# Patient Record
Sex: Male | Born: 1955
Health system: Southern US, Community
[De-identification: ages and names within clinical notes are randomized; demographics above are authoritative.]

## PROBLEM LIST (undated history)

## (undated) DIAGNOSIS — I472 Ventricular tachycardia, unspecified: Secondary | ICD-10-CM

## (undated) DIAGNOSIS — Z95810 Presence of automatic (implantable) cardiac defibrillator: Secondary | ICD-10-CM

## (undated) DIAGNOSIS — I10 Essential (primary) hypertension: Secondary | ICD-10-CM

## (undated) DIAGNOSIS — I509 Heart failure, unspecified: Secondary | ICD-10-CM

## (undated) DIAGNOSIS — I5022 Chronic systolic (congestive) heart failure: Secondary | ICD-10-CM

## (undated) DIAGNOSIS — I700 Atherosclerosis of aorta: Secondary | ICD-10-CM

## (undated) DIAGNOSIS — I4729 Other ventricular tachycardia: Secondary | ICD-10-CM

## (undated) DIAGNOSIS — R918 Other nonspecific abnormal finding of lung field: Secondary | ICD-10-CM

## (undated) DIAGNOSIS — G4730 Sleep apnea, unspecified: Secondary | ICD-10-CM

## (undated) DIAGNOSIS — K7460 Unspecified cirrhosis of liver: Secondary | ICD-10-CM

## (undated) DIAGNOSIS — Z950 Presence of cardiac pacemaker: Secondary | ICD-10-CM

## (undated) DIAGNOSIS — E785 Hyperlipidemia, unspecified: Secondary | ICD-10-CM

## (undated) DIAGNOSIS — J449 Chronic obstructive pulmonary disease, unspecified: Secondary | ICD-10-CM

## (undated) DIAGNOSIS — E119 Type 2 diabetes mellitus without complications: Secondary | ICD-10-CM

## (undated) DIAGNOSIS — I2089 Other forms of angina pectoris: Secondary | ICD-10-CM

## (undated) DIAGNOSIS — IMO0001 Reserved for inherently not codable concepts without codable children: Secondary | ICD-10-CM

## (undated) DIAGNOSIS — I2510 Atherosclerotic heart disease of native coronary artery without angina pectoris: Secondary | ICD-10-CM

## (undated) DIAGNOSIS — K219 Gastro-esophageal reflux disease without esophagitis: Secondary | ICD-10-CM

## (undated) DIAGNOSIS — I208 Other forms of angina pectoris: Secondary | ICD-10-CM

## (undated) DIAGNOSIS — I255 Ischemic cardiomyopathy: Secondary | ICD-10-CM

## (undated) DIAGNOSIS — I219 Acute myocardial infarction, unspecified: Secondary | ICD-10-CM

## (undated) HISTORY — DX: Type 2 diabetes mellitus without complications: E11.9

## (undated) HISTORY — DX: Sleep apnea, unspecified: G47.30

## (undated) HISTORY — PX: CARDIAC SURGERY: SHX584

## (undated) HISTORY — PX: CORONARY ANGIOPLASTY: SHX604

## (undated) HISTORY — DX: Chronic obstructive pulmonary disease, unspecified: J44.9

## (undated) HISTORY — PX: OTHER SURGICAL HISTORY: SHX169

## (undated) HISTORY — PX: CARDIAC PACEMAKER PLACEMENT: SHX583

## (undated) HISTORY — DX: Acute myocardial infarction, unspecified: I21.9

---

## 2008-03-03 ENCOUNTER — Emergency Department: Payer: Self-pay | Admitting: Emergency Medicine

## 2011-07-31 DIAGNOSIS — Z8249 Family history of ischemic heart disease and other diseases of the circulatory system: Secondary | ICD-10-CM | POA: Insufficient documentation

## 2011-10-07 ENCOUNTER — Ambulatory Visit: Payer: Self-pay | Admitting: Cardiovascular Disease

## 2011-10-10 LAB — CBC
HCT: 41 % (ref 40.0–52.0)
HGB: 14.3 g/dL (ref 13.0–18.0)
MCH: 32 pg (ref 26.0–34.0)
MCHC: 34.8 g/dL (ref 32.0–36.0)
MCV: 92 fL (ref 80–100)
RBC: 4.45 10*6/uL (ref 4.40–5.90)
RDW: 14.7 % — ABNORMAL HIGH (ref 11.5–14.5)
WBC: 8.2 10*3/uL (ref 3.8–10.6)

## 2011-10-10 LAB — COMPREHENSIVE METABOLIC PANEL
Albumin: 3.6 g/dL (ref 3.4–5.0)
Alkaline Phosphatase: 110 U/L (ref 50–136)
Anion Gap: 7 (ref 7–16)
BUN: 17 mg/dL (ref 7–18)
Creatinine: 1.28 mg/dL (ref 0.60–1.30)
EGFR (African American): >60
EGFR (Non-African Amer.): >60
Glucose: 132 mg/dL — ABNORMAL HIGH (ref 65–99)
Osmolality: 275 (ref 275–301)
SGOT(AST): 25 U/L (ref 15–37)
SGPT (ALT): 25 U/L
Total Protein: 7.8 g/dL (ref 6.4–8.2)

## 2011-10-10 LAB — PROTIME-INR: INR: 0.9

## 2011-10-10 LAB — TROPONIN I: Troponin-I: <0.02 ng/mL

## 2011-10-11 ENCOUNTER — Observation Stay: Payer: Self-pay | Admitting: Internal Medicine

## 2011-10-11 ENCOUNTER — Ambulatory Visit: Payer: Self-pay | Admitting: Neurology

## 2011-10-11 LAB — LIPID PANEL
HDL Cholesterol: 19 mg/dL — ABNORMAL LOW (ref 40–60)
Triglycerides: 271 mg/dL — ABNORMAL HIGH (ref 0–200)
VLDL Cholesterol, Calc: 54 mg/dL — ABNORMAL HIGH (ref 5–40)

## 2011-10-11 LAB — CBC WITH DIFFERENTIAL/PLATELET
Basophil #: 0 10*3/uL (ref 0.0–0.1)
Eosinophil #: 0.2 10*3/uL (ref 0.0–0.7)
HGB: 13.2 g/dL (ref 13.0–18.0)
MCH: 31.5 pg (ref 26.0–34.0)
MCV: 93 fL (ref 80–100)
Monocyte #: 0.7 x10 3/mm (ref 0.2–1.0)
Monocyte %: 9.5 %
Neutrophil #: 4.2 10*3/uL (ref 1.4–6.5)
Platelet: 175 10*3/uL (ref 150–440)
RBC: 4.2 10*6/uL — ABNORMAL LOW (ref 4.40–5.90)
RDW: 14.8 % — ABNORMAL HIGH (ref 11.5–14.5)
WBC: 7.2 10*3/uL (ref 3.8–10.6)

## 2011-10-11 LAB — BASIC METABOLIC PANEL
Chloride: 107 mmol/L (ref 98–107)
Creatinine: 1.12 mg/dL (ref 0.60–1.30)
EGFR (Non-African Amer.): >60
Glucose: 138 mg/dL — ABNORMAL HIGH (ref 65–99)
Potassium: 3.9 mmol/L (ref 3.5–5.1)

## 2011-10-11 LAB — TROPONIN I: Troponin-I: <0.02 ng/mL

## 2011-10-11 LAB — MAGNESIUM: Magnesium: 2.2 mg/dL

## 2012-06-16 ENCOUNTER — Observation Stay: Payer: Self-pay | Admitting: Internal Medicine

## 2012-06-16 LAB — CBC WITH DIFFERENTIAL/PLATELET
Basophil #: 0.1 10*3/uL (ref 0.0–0.1)
Basophil %: 1.3 %
Eosinophil #: 0.2 10*3/uL (ref 0.0–0.7)
Eosinophil %: 2.5 %
HCT: 42.2 % (ref 40.0–52.0)
MCH: 30.6 pg (ref 26.0–34.0)
MCHC: 33.3 g/dL (ref 32.0–36.0)
Monocyte #: 0.6 x10 3/mm (ref 0.2–1.0)
Monocyte %: 5.8 %
Neutrophil #: 6.9 10*3/uL — ABNORMAL HIGH (ref 1.4–6.5)
Neutrophil %: 69.7 %
Platelet: 250 10*3/uL (ref 150–440)
RDW: 14.8 % — ABNORMAL HIGH (ref 11.5–14.5)
WBC: 9.8 10*3/uL (ref 3.8–10.6)

## 2012-06-16 LAB — TROPONIN I
Troponin-I: <0.02 ng/mL
Troponin-I: <0.02 ng/mL

## 2012-06-16 LAB — COMPREHENSIVE METABOLIC PANEL
Albumin: 3.5 g/dL (ref 3.4–5.0)
Alkaline Phosphatase: 112 U/L (ref 50–136)
BUN: 12 mg/dL (ref 7–18)
Bilirubin,Total: 0.3 mg/dL (ref 0.2–1.0)
Calcium, Total: 8.3 mg/dL — ABNORMAL LOW (ref 8.5–10.1)
Chloride: 100 mmol/L (ref 98–107)
Co2: 26 mmol/L (ref 21–32)
Creatinine: 1.18 mg/dL (ref 0.60–1.30)
Osmolality: 276 (ref 275–301)
Total Protein: 7.2 g/dL (ref 6.4–8.2)

## 2012-06-16 LAB — CK TOTAL AND CKMB (NOT AT ARMC)
CK, Total: 260 U/L — ABNORMAL HIGH (ref 35–232)
CK, Total: 249 U/L — ABNORMAL HIGH (ref 35–232)
CK-MB: 3.5 ng/mL (ref 0.5–3.6)

## 2012-06-17 LAB — LIPID PANEL: Triglycerides: 287 mg/dL — ABNORMAL HIGH (ref 0–200)

## 2012-07-01 ENCOUNTER — Ambulatory Visit: Payer: Self-pay | Admitting: Cardiovascular Disease

## 2012-10-26 ENCOUNTER — Ambulatory Visit: Payer: Self-pay | Admitting: Internal Medicine

## 2012-11-30 ENCOUNTER — Ambulatory Visit: Payer: Self-pay | Admitting: Emergency Medicine

## 2012-12-01 LAB — PATHOLOGY REPORT

## 2013-06-24 ENCOUNTER — Ambulatory Visit: Payer: Self-pay | Admitting: Podiatry

## 2013-07-15 ENCOUNTER — Ambulatory Visit (INDEPENDENT_AMBULATORY_CARE_PROVIDER_SITE_OTHER): Payer: Commercial Managed Care - HMO | Admitting: Podiatry

## 2013-07-15 ENCOUNTER — Encounter: Payer: Self-pay | Admitting: Podiatry

## 2013-07-15 VITALS — BP 74/46 | HR 90 | Resp 16 | Ht 64.0 in | Wt 150.0 lb

## 2013-07-15 DIAGNOSIS — B351 Tinea unguium: Secondary | ICD-10-CM

## 2013-07-15 DIAGNOSIS — M79609 Pain in unspecified limb: Secondary | ICD-10-CM

## 2013-07-15 NOTE — Progress Notes (Signed)
Subjective:     Patient ID: John Frank, male   DOB: [birth year], 58 y.o.   MRN: [id number]  HPI patient is a diabetic who presents with nail disease 1-5 both feet with thickness and discomfort when pressed and inability to cut himself   Review of Systems     Objective:   Physical Exam Neurovascular status intact with range of motion adequate normal muscle strength and no equinus condition noted. Nail disease 1-5 both feet with hallux nails bilateral been very thickened and dystrophic and the second nails also been very thickened and dystrophic    Assessment:     Chronic mycotic nail infection with pain both feet    Plan:     Debridement painful nail bed 1-5 both feet date H&P and deep instructions on diabetic care

## 2013-07-15 NOTE — Progress Notes (Signed)
   Subjective:    Patient ID: John Frank, male    DOB: 03-28-1956, 58 y.o.   MRN: [id number]  HPI Comments: N 0 L trim toenails D yrs O slowly C worse A growing out T pt trims toenails      Review of Systems  Respiratory: Positive for cough and shortness of breath.   Hematological: Bruises/bleeds easily.  All other systems reviewed and are negative.       Objective:   Physical Exam        Assessment & Plan:

## 2013-07-29 ENCOUNTER — Ambulatory Visit: Payer: Self-pay | Admitting: Internal Medicine

## 2013-08-10 ENCOUNTER — Ambulatory Visit: Payer: Self-pay | Admitting: Internal Medicine

## 2013-09-09 ENCOUNTER — Ambulatory Visit: Payer: Self-pay | Admitting: Internal Medicine

## 2013-10-13 ENCOUNTER — Emergency Department: Payer: Self-pay | Admitting: Emergency Medicine

## 2013-10-13 LAB — TROPONIN I: Troponin-I: <0.02 ng/mL

## 2013-10-13 LAB — COMPREHENSIVE METABOLIC PANEL
ALBUMIN: 3.4 g/dL (ref 3.4–5.0)
ALT: 23 U/L (ref 12–78)
Alkaline Phosphatase: 107 U/L
Anion Gap: 7 (ref 7–16)
BUN: 10 mg/dL (ref 7–18)
Bilirubin,Total: 0.6 mg/dL (ref 0.2–1.0)
Calcium, Total: 9.1 mg/dL (ref 8.5–10.1)
Chloride: 102 mmol/L (ref 98–107)
Co2: 28 mmol/L (ref 21–32)
Creatinine: 1.25 mg/dL (ref 0.60–1.30)
EGFR (Non-African Amer.): >60
Glucose: 136 mg/dL — ABNORMAL HIGH (ref 65–99)
Osmolality: 275 (ref 275–301)
Potassium: 3.8 mmol/L (ref 3.5–5.1)
SGOT(AST): 29 U/L (ref 15–37)
SODIUM: 137 mmol/L (ref 136–145)
Total Protein: 7.6 g/dL (ref 6.4–8.2)

## 2013-10-13 LAB — CBC
HCT: 40.2 % (ref 40.0–52.0)
HGB: 13.3 g/dL (ref 13.0–18.0)
MCH: 30.2 pg (ref 26.0–34.0)
MCHC: 33.1 g/dL (ref 32.0–36.0)
MCV: 91 fL (ref 80–100)
PLATELETS: 232 10*3/uL (ref 150–440)
RBC: 4.42 10*6/uL (ref 4.40–5.90)
RDW: 15.4 % — ABNORMAL HIGH (ref 11.5–14.5)
WBC: 12.6 10*3/uL — ABNORMAL HIGH (ref 3.8–10.6)

## 2013-10-14 ENCOUNTER — Ambulatory Visit: Payer: Commercial Managed Care - HMO | Admitting: Podiatry

## 2014-05-09 ENCOUNTER — Emergency Department: Payer: Self-pay | Admitting: Internal Medicine

## 2014-05-09 LAB — BASIC METABOLIC PANEL
ANION GAP: 8 (ref 7–16)
BUN: 6 mg/dL — ABNORMAL LOW (ref 7–18)
Calcium, Total: 9.5 mg/dL (ref 8.5–10.1)
Chloride: 103 mmol/L (ref 98–107)
Co2: 28 mmol/L (ref 21–32)
Creatinine: 1.11 mg/dL (ref 0.60–1.30)
EGFR (African American): >60
EGFR (Non-African Amer.): >60
Glucose: 138 mg/dL — ABNORMAL HIGH (ref 65–99)
OSMOLALITY: 277 (ref 275–301)
Potassium: 3.6 mmol/L (ref 3.5–5.1)
SODIUM: 139 mmol/L (ref 136–145)

## 2014-05-09 LAB — TROPONIN I
Troponin-I: <0.02 ng/mL
Troponin-I: <0.02 ng/mL

## 2014-05-09 LAB — CBC
HCT: 45 % (ref 40.0–52.0)
HGB: 14.8 g/dL (ref 13.0–18.0)
MCH: 30.6 pg (ref 26.0–34.0)
MCHC: 32.9 g/dL (ref 32.0–36.0)
MCV: 93 fL (ref 80–100)
Platelet: 239 10*3/uL (ref 150–440)
RBC: 4.84 10*6/uL (ref 4.40–5.90)
RDW: 15.3 % — ABNORMAL HIGH (ref 11.5–14.5)
WBC: 10 10*3/uL (ref 3.8–10.6)

## 2014-05-16 DIAGNOSIS — E11329 Type 2 diabetes mellitus with mild nonproliferative diabetic retinopathy without macular edema: Secondary | ICD-10-CM | POA: Diagnosis not present

## 2014-05-16 DIAGNOSIS — H40033 Anatomical narrow angle, bilateral: Secondary | ICD-10-CM | POA: Diagnosis not present

## 2014-05-29 DIAGNOSIS — J449 Chronic obstructive pulmonary disease, unspecified: Secondary | ICD-10-CM | POA: Diagnosis not present

## 2014-06-01 DIAGNOSIS — J449 Chronic obstructive pulmonary disease, unspecified: Secondary | ICD-10-CM | POA: Diagnosis not present

## 2014-06-12 DIAGNOSIS — I429 Cardiomyopathy, unspecified: Secondary | ICD-10-CM | POA: Diagnosis not present

## 2014-06-12 DIAGNOSIS — I509 Heart failure, unspecified: Secondary | ICD-10-CM | POA: Diagnosis not present

## 2014-06-12 DIAGNOSIS — R011 Cardiac murmur, unspecified: Secondary | ICD-10-CM | POA: Diagnosis not present

## 2014-06-12 DIAGNOSIS — I2510 Atherosclerotic heart disease of native coronary artery without angina pectoris: Secondary | ICD-10-CM | POA: Diagnosis not present

## 2014-06-13 DIAGNOSIS — I10 Essential (primary) hypertension: Secondary | ICD-10-CM | POA: Diagnosis not present

## 2014-06-13 DIAGNOSIS — E119 Type 2 diabetes mellitus without complications: Secondary | ICD-10-CM | POA: Diagnosis not present

## 2014-06-13 DIAGNOSIS — I2510 Atherosclerotic heart disease of native coronary artery without angina pectoris: Secondary | ICD-10-CM | POA: Diagnosis not present

## 2014-06-13 DIAGNOSIS — E785 Hyperlipidemia, unspecified: Secondary | ICD-10-CM | POA: Diagnosis not present

## 2014-06-23 DIAGNOSIS — I429 Cardiomyopathy, unspecified: Secondary | ICD-10-CM | POA: Diagnosis not present

## 2014-06-23 DIAGNOSIS — R011 Cardiac murmur, unspecified: Secondary | ICD-10-CM | POA: Diagnosis not present

## 2014-06-23 DIAGNOSIS — I509 Heart failure, unspecified: Secondary | ICD-10-CM | POA: Diagnosis not present

## 2014-06-23 DIAGNOSIS — I2510 Atherosclerotic heart disease of native coronary artery without angina pectoris: Secondary | ICD-10-CM | POA: Diagnosis not present

## 2014-06-23 DIAGNOSIS — R079 Chest pain, unspecified: Secondary | ICD-10-CM | POA: Diagnosis not present

## 2014-07-02 DIAGNOSIS — J449 Chronic obstructive pulmonary disease, unspecified: Secondary | ICD-10-CM | POA: Diagnosis not present

## 2014-07-10 DIAGNOSIS — J449 Chronic obstructive pulmonary disease, unspecified: Secondary | ICD-10-CM | POA: Diagnosis not present

## 2014-07-10 DIAGNOSIS — R0602 Shortness of breath: Secondary | ICD-10-CM | POA: Diagnosis not present

## 2014-07-10 DIAGNOSIS — I209 Angina pectoris, unspecified: Secondary | ICD-10-CM | POA: Diagnosis not present

## 2014-07-10 DIAGNOSIS — I429 Cardiomyopathy, unspecified: Secondary | ICD-10-CM | POA: Diagnosis not present

## 2014-07-14 DIAGNOSIS — I10 Essential (primary) hypertension: Secondary | ICD-10-CM | POA: Diagnosis not present

## 2014-07-14 DIAGNOSIS — I2510 Atherosclerotic heart disease of native coronary artery without angina pectoris: Secondary | ICD-10-CM | POA: Diagnosis not present

## 2014-07-14 DIAGNOSIS — J3489 Other specified disorders of nose and nasal sinuses: Secondary | ICD-10-CM | POA: Diagnosis not present

## 2014-07-14 DIAGNOSIS — G4730 Sleep apnea, unspecified: Secondary | ICD-10-CM | POA: Diagnosis not present

## 2014-07-14 DIAGNOSIS — J449 Chronic obstructive pulmonary disease, unspecified: Secondary | ICD-10-CM | POA: Diagnosis not present

## 2014-07-14 DIAGNOSIS — E119 Type 2 diabetes mellitus without complications: Secondary | ICD-10-CM | POA: Diagnosis not present

## 2014-07-14 DIAGNOSIS — E785 Hyperlipidemia, unspecified: Secondary | ICD-10-CM | POA: Diagnosis not present

## 2014-07-26 DIAGNOSIS — J449 Chronic obstructive pulmonary disease, unspecified: Secondary | ICD-10-CM | POA: Diagnosis not present

## 2014-07-31 DIAGNOSIS — J449 Chronic obstructive pulmonary disease, unspecified: Secondary | ICD-10-CM | POA: Diagnosis not present

## 2014-08-11 ENCOUNTER — Ambulatory Visit (INDEPENDENT_AMBULATORY_CARE_PROVIDER_SITE_OTHER): Payer: Commercial Managed Care - HMO | Admitting: Podiatry

## 2014-08-11 DIAGNOSIS — M79676 Pain in unspecified toe(s): Secondary | ICD-10-CM | POA: Diagnosis not present

## 2014-08-11 DIAGNOSIS — B351 Tinea unguium: Secondary | ICD-10-CM

## 2014-08-11 NOTE — Progress Notes (Signed)
Subjective:     Patient ID: John Frank, male   DOB: 09-24-1955, 59 y.o.   MRN: [id number]  HPI patient is a diabetic who presents with nail disease 1-5 both feet with thickness and discomfort when pressed and inability to cut himself   Review of Systems     Objective:   Physical Exam Neurovascular status intact with range of motion adequate normal muscle strength and no equinus condition noted. Nail disease 1-5 both feet with hallux nails bilateral been very thickened and dystrophic and the second nails also been very thickened and dystrophic    Assessment:     Chronic mycotic nail infection with pain both feet    Plan:     Debridement painful nail bed 1-5 both feet date H&P and deep instructions on diabetic care

## 2014-08-18 DIAGNOSIS — R1310 Dysphagia, unspecified: Secondary | ICD-10-CM | POA: Diagnosis not present

## 2014-08-18 DIAGNOSIS — M75102 Unspecified rotator cuff tear or rupture of left shoulder, not specified as traumatic: Secondary | ICD-10-CM | POA: Diagnosis not present

## 2014-08-22 DIAGNOSIS — M25512 Pain in left shoulder: Secondary | ICD-10-CM | POA: Diagnosis not present

## 2014-08-22 DIAGNOSIS — M6281 Muscle weakness (generalized): Secondary | ICD-10-CM | POA: Diagnosis not present

## 2014-08-22 DIAGNOSIS — M25612 Stiffness of left shoulder, not elsewhere classified: Secondary | ICD-10-CM | POA: Diagnosis not present

## 2014-08-31 DIAGNOSIS — J449 Chronic obstructive pulmonary disease, unspecified: Secondary | ICD-10-CM | POA: Diagnosis not present

## 2014-08-31 DIAGNOSIS — R1310 Dysphagia, unspecified: Secondary | ICD-10-CM | POA: Diagnosis not present

## 2014-09-01 NOTE — Consult Note (Signed)
Brief Consult Note: Diagnosis: CP in pt with h/o CAD and ischemic cardiomyopathy.   Patient was seen by consultant.   Recommend further assessment or treatment.   Orders entered.   Comments: Patient with epsiode of non-exertional CP last night that lasted 4-5hrs, not associated with SOB, radiation, ot diaphoresis. Prior to this event he had lifted a case of water and had episode of dysphagia which required n/v. He is CP free at this time and TNI neg x 1. He had cardiac cath 10/13/11 with mid LAD lesion 40% stenosis, mid LCX 40% stenosis, prox RCA 40% stenosis at site of prior stent. CP likely non-cardiac, could be from musculoskeletal causes or GI pathology. Will change from H2RB to PPI, cycle CE, get echo to check LVEF and wall motion and add Imdur. Pt CP free at this time and will observe.  Electronic Signatures: Angelica Ran (MD)   (Signed 06-Feb-14 06:47)  Co-Signer: Brief Consult Note Merla Riches (PA-C)   (Signed 05-Feb-14 09:16)  Authored: Brief Consult Note  Last Updated: 06-Feb-14 06:47 by Angelica Ran (MD)

## 2014-09-01 NOTE — H&P (Signed)
PATIENT NAME:  John Frank, VIERNES MR#:  [id number] DATE OF BIRTH:  [birth year]  DATE OF ADMISSION:  06/15/2012  PRIMARY CARE PHYSICIAN: Alfredia Ferguson A. Elijio Miles, MD  CARDIOLOGIST: Dionisio David, MD  CHIEF COMPLAINT: Chest pain last night.   HISTORY OF PRESENT ILLNESS: Mr. John Frank is a 59 year old Caucasian gentleman with history of coronary artery disease status post stent in March 2013 and with history of ischemic cardiomyopathy, EF around 30%, with history of AICD placement last year, in 2013, after he had episodes of syncopal episode and wide QRS tachycardia. The patient comes in today with complaints of chest pain yesterday evening. The patient said he went grocery shopping and lifted a heavy case of bottled water. He was lying in his bed, started having pain around 9:30 p.m. in mid substernal, nonradiating. He rates his pain 8/10. He received some aspirin, and the patient is chest pain free. His cardiac enzymes are negative first, and EKG appears without any ST elevation or depression. He is being admitted for unstable angina.   PAST MEDICAL HISTORY:  1. Coronary artery disease status post stent placed at [facility] in March 2013.  2. AICD placement secondary to suspected ischemic cardiomyopathy and arrhythmias.  3. Hypertension.  4. Hyperlipidemia.  5. COPD with ongoing tobacco abuse.  6. GERD.   FAMILY HISTORY: Positive for hypertension.   ALLERGIES: No known drug allergies.   MEDICATIONS:  1. Lipitor 10 mg daily.  2. Pepcid 40 mg daily.  3. Plavix 75 mg daily.  4. Aspirin 81 mg daily.  5. Metoprolol ER 50 mg daily.  6. Enalapril maleate 5 mg daily. 7. Spiriva 1 capsule inhalation daily.  8. Tylenol Extra Strength 500 mg 2 capsules daily p.r.n.   SOCIAL HISTORY: Lives at home with his girlfriend. Admits to smoking about 3 to 4 packs of cigarettes per day. No recreational drug use. Retired from [employer] work.   REVIEW OF SYSTEMS:  CONSTITUTIONAL: No fever, fatigue, weakness.  EYES: No blurred  or double vision. No glaucoma. ENT: No tinnitus, ear pain, hearing loss.  RESPIRATORY: No cough, wheeze, hemoptysis.  CARDIOVASCULAR: Positive for chest pain, hypertension.  GASTROINTESTINAL: No nausea, vomiting, diarrhea, abdominal pain. Positive for GERD.  GENITOURINARY: No dysuria or hematuria.  ENDOCRINE: No polyuria, nocturia or thyroid problems.  HEMATOLOGY: No anemia or easy bruising.  SKIN: No acne or rash.  MUSCULOSKELETAL: Positive for back pain.  NEUROLOGIC: No CVA or TIA.  PSYCHIATRIC: No anxiety or depression.  All other systems reviewed and negative.   PHYSICAL EXAMINATION:  GENERAL: The patient is awake, alert, oriented x3, not in acute distress.  VITAL SIGNS: Afebrile, pulse is 74, blood pressure is 139/71, sats are 96% on room air.  HEENT: Atraumatic, normocephalic. PERRLA. EOM intact. Oral mucosa is moist.  NECK: Supple. No JVD. No carotid bruit.  RESPIRATORY: Clear to auscultation bilaterally. No rales, rhonchi, respiratory distress or labored breathing.  CARDIOVASCULAR: Both the heart sounds are normal. Rate and rhythm regular. PMI non-lateralized.  CHEST: Nontender.  EXTREMITIES: Good pedal pulses, good femoral pulses. No lower extremity edema.  ABDOMEN: Obese, soft, nontender. No organomegaly. Positive bowel sounds.  NEUROLOGIC: Grossly intact cranial nerves II through XII. No motor or sensory deficits.  PSYCHIATRIC: The patient is awake, alert, oriented x3.  SKIN: Warm and dry.   DIAGNOSTIC STUDIES: EKG shows normal sinus rhythm. Q waves in inferior leads. CBC within normal limits. PT/INR within normal limits. Troponin is less than 0.02. Comprehensive metabolic panel within normal limits except glucose of 209, BUN is  12, calcium of 8.3.   ASSESSMENT: A 59 year old patient with history of coronary artery disease, hypertension, hyperlipidemia, comes in with:   1. Unstable angina in the setting of coronary artery disease status post stent. The patient does not know  what vessel. This was done in March 2013 at [facility]. Will admit the patient for overnight observation. Give prudent diet. Continue aspirin, beta blockers, Lovenox subcutaneous. Cycle cardiac enzymes x3. Will continue also Plavix. Will have Dr. Neoma Laming see the patient in the morning.  2. Coronary artery disease status post stent in March 2013 at [facility]. Will continue aspirin, Plavix, statins, beta blockers. The patient is currently chest pain free.  3. Chronic obstructive pulmonary disease with ongoing tobacco abuse. Smoking cessation counseled. About 3 minutes spent. It does not seem the patient is much motivated. Continue Spiriva. Sats are 96% on room air.  4. Hypertension, on enalapril and beta blockers.  5. Hyperlipidemia. Continue atorvastatin.  6. Further workup according to the patient's clinical course. Hospital admission plan was discussed with the patient and the patient's family members.   TIME SPENT: 50 minutes.   The patient will be transferred to Dr. Elijio Miles.   ____________________________ Hart Rochester. Posey Pronto, MD sap:OSi D: 06/16/2012 04:43:34 ET T: 06/16/2012 06:44:26 ET JOB#: [id number]  cc: Shulamis Wenberg A. Posey Pronto, MD, <Dictator> Sheikh A. Elijio Miles, MD Dionisio David, MD  Ilda Basset MD ELECTRONICALLY SIGNED 06/18/2012 7:06

## 2014-09-01 NOTE — Consult Note (Signed)
PATIENT NAME:  John Frank, John Frank MR#:  [id number] DATE OF BIRTH:  1956/01/22  DATE OF CONSULTATION:  06/16/2012  REFERRING PHYSICIAN:  Sona A. Posey Pronto, MD CONSULTING PHYSICIAN:  Merla Riches, PA-C; Dionisio David, MD  PRIMARY CARE PHYSICIAN: Venetia Maxon. Elijio Miles, MD  REASON FOR CONSULTATION: Chest pain.   HISTORY OF PRESENT ILLNESS: The patient is a 59 year old white male who is known to our office. He has a history of coronary artery disease, ischemic cardiomyopathy, hypertension, hyperlipidemia, COPD and gastroesophageal reflux disease. The patient notes that yesterday he was lifting heavy objects (cases of water from [place]) and had some soreness in his abdomen and upper chest. He notes that last night when he was in bed, he had pain in his chest that was rated 8/10 on the pain scale. Pain did not radiate, was not associated with any shortness of breath but lasted for several hours and thus, he was brought to the Emergency Department. The patient never took any sublingual nitroglycerin for this pain. He does have some shortness of breath on exertion and coughing. He also notes that he has had new onset dysphagia to solids and had an episode of nausea and vomiting last evening before going to bed.   PAST MEDICAL HISTORY:  1.  Coronary artery disease with history of prior myocardial infarction, PTCA with Xience Expedition stent to the RCA x 2 done on 07/24/2010 at [facility].   2.  Hypertension.  3.  COPD.  4.  Hyperlipidemia.  5.  Nicotine dependence.  6.  Bilateral blindness.  7.  AICD placement in March 2013.   PAST SURGICAL HISTORY:  1.  Umbilical hernia repair in 1993.  2.  The patient is unsure if he has had cholecystectomy.   ALLERGIES: No known drug allergies.   HOME MEDICATIONS:  1.  Aspirin 81 mg p.o. daily.  2.  Enalapril 5 mg p.o. daily.  3.  Famotidine 40 mg p.o. daily.  4.  Lipitor 10 mg p.o. at bedtime.  5.  Metoprolol succinate 50 mg p.o. daily.  6.  Nitrostat 0.4 mg  sublingually as needed for chest pain.  7.  Plavix 75 mg p.o. daily.  8.  Spiriva HandiHaler 18 mcg, use as directed.   SOCIAL HISTORY: The patient lives at home with his girlfriend. He smokes about 3 to 4 packs of cigarettes per day. He denies any recreational drug use. Denies alcohol use.   REVIEW OF SYSTEMS:  CONSTITUTIONAL: The patient denies any fever, fatigue or weakness.  EYES: The patient has poor vision, wears glasses.  ENT: No tinnitus, ear pain or hearing loss.  RESPIRATORY: The patient has shortness of breath on exertion, coughing.  CARDIOVASCULAR: The patient has had chest pain, which is resolved at this time. GASTROINTESTINAL: The patient complains of dysphagia with nausea and vomiting of solid food content.  MUSCULOSKELETAL: The patient complains of back pain.   PHYSICAL EXAMINATION:  GENERAL: This is a pleasant male who is not in any acute distress. He is alert and oriented and eager to eat his breakfast this morning.  VITAL SIGNS: Temperature 97.3 degrees Fahrenheit, heart rate 78, respiratory rate 16, blood pressure 120/85, O2 saturation 91% on room air.  HEENT: Head atraumatic, normocephalic. Eyes: The patient wears glasses. Pupils are round and equal. There is no scleral icterus. Conjunctivae are pink. Ears and nose are normal to external inspection. Mouth: Moist mucous membranes.  NECK: Supple. Trachea is midline. No carotid bruits.  PULMONARY: The patient has some diminished breath  sounds, although they are clear with no adventitious breath sounds that can be appreciated.  CARDIOVASCULAR: Regular rate and rhythm. No murmurs appreciated.  ABDOMEN: Nondistended. Bowel sounds are present. Soft to palpation.  EXTREMITIES: No cyanosis, clubbing or edema.   ANCILLARY DATA: Chest x-ray done on 06/16/2012: No acute findings. Abdominal ultrasound: Status post cholecystectomy changes.   LABORATORY DATA: Glucose 209, BUN 12, creatinine 1.18, sodium 135, potassium 3.8, chloride  100, CO2 of 26, calcium 8.3, total protein 7.2, albumin 3.5, total bilirubin 0.3, alkaline phosphatase 112, AST 25, ALT 29. Troponin I is less than 0.02. White blood cell count is 9.8, hemoglobin 14.0, hematocrit 42.2, platelet count 250,000. PT is 13.4, INR 1.0.   ASSESSMENT AND PLAN: Chest pain in patient with known coronary artery disease and ischemic cardiomyopathy: The patient had an episode of nonexertional chest pain last evening that lasted several hours and is currently chest pain-free. Pain did not have any associated shortness of breath, radiation or diaphoresis. Prior to this event, he had lifted a case of water and also had an episode of dysphagia which required nausea and vomiting. His troponin has been negative x 1. We will continue to cycle cardiac enzymes, get echocardiogram to rule out any possible wall motion abnormalities and check his left ventricular ejection fraction. He had cardiac catheterization on 10/13/2011 with mid left anterior descending lesion of 40%, mid circumflex of 40%, proximal right coronary artery of 40% at the site of his prior stent. His chest pain is likely noncardiac and could be from either musculoskeletal causes or gastrointestinal pathology. We will change from histamine receptor antagonist to proton pump inhibitors, cycle cardiac enzymes and add isosorbide. We will continue to follow this patient with you.   Thank you very much for allowing me to participate in this patient's care.    ____________________________ Merla Riches, PA-C mam:jm D: 06/16/2012 13:08:07 ET T: 06/16/2012 14:16:44 ET JOB#: [id number]  cc: Merla Riches, PA-C, <Dictator> Marni Franzoni A Urbano Milhouse PA ELECTRONICALLY SIGNED 06/21/2012 10:16

## 2014-09-03 NOTE — Consult Note (Signed)
PATIENT NAME:  John Frank, John Frank MR#:  [id number] DATE OF BIRTH:  Sep 22, 1955  DATE OF CONSULTATION:  10/11/2011  REFERRING PHYSICIAN:   CONSULTING PHYSICIAN:  Dionisio David, MD  INDICATION FOR CONSULTATION: Chest pain and syncope.   HISTORY OF PRESENT ILLNESS: This is a 59 year old white male who is a patient in our clinic. Apparently he was found on concrete with a laceration to the back of his skull after falling down. He had an episode where he completely passed out. He denies any chest pain right now, but did have an episode of chest pain prior to this. He does not remember anything and is anxious to go home because he normally smokes. Right now he is alert, oriented times three. Denies any chest pain or any confusion.   PAST MEDICAL HISTORY:  History of borderline diabetes, hypertension, hyperlipidemia, history of coronary artery disease, chronic obstructive pulmonary disease, history of dilated cardiomyopathy with left ventricular ejection fraction 25%. He was seen with similar type of syncopal episode on 07/28/2011 and was admitted to [facility] where he had a cardiac catheterization which was done by Dr. Peterson Ao. Cardiac catheterization revealed a left ventricular ejection fraction 30%, 50% proximal LAD. Mild irregularities in the left circumflex, 90% high-grade, long lesion in the mid RCA for which he had two drug-eluting stents implanted. On 07/25/2011 he also underwent EP studies with infusion of Isuprel which revealed he had carotid sinus hypersensitivity, which was inducible and causing nonsustained monomorphic ventricular tachycardia. After that he had a dual-chamber defibrillator implanted.   SOCIAL HISTORY: He smokes 3 to 4 packs per day. Denies EtOH abuse.   FAMILY HISTORY: Positive for coronary artery disease.   ALLERGIES: None.   MEDICATIONS:  1. Aspirin 81 mg p.o. daily.  2. Lipitor 10 mg daily.  3. Plavix 75 mg p.o. daily.  4. Enalapril 5 mg p.o. daily.   5. Famotidine 40 mg p.o. daily.  6. Metoprolol succinate 50 mg p.o. daily.  7. Spiriva.  8. Tylenol.   PHYSICAL EXAMINATION:  GENERAL: He is alert, oriented times three, in no acute distress right now.   VITAL SIGNS: Stable.   NECK: No JVD.   LUNGS: Clear.   HEART: Regular rate and rhythm. Normal S1, S2. No audible murmur.   ABDOMEN: Soft, nontender, positive bowel sounds.   EXTREMITIES: No pedal edema.   LABS/STUDIES: EKG showed normal sinus rhythm, essentially unremarkable and within normal limits. CT of the brain revealed no evidence of any acute abnormalities.   ASSESSMENT AND PLAN: The patient had a syncopal episode, which he has had in the past. He has a history of nonsustained ventricular tachycardia with carotid sinus hypersensitivity, history of PCI and stenting of the right coronary and defibrillator implantation just in March of this year, hypertension, hyperlipidemia, tobacco use. Advise evaluation by neurology also, but most likely this is all related to cardiac event. We will monitor the patient on telemetry for 48 hours and decide the next course, see if there are any ventricular arrhythmias. Advise giving NicoDerm patch in the meantime since the patient is anxious to go home. Thank you very much for the referral.   ____________________________ Dionisio David, MD sak:bjt D: 10/11/2011 10:57:29 ET T: 10/11/2011 11:48:24 ET JOB#: [id number]  cc: Dionisio David, MD, <Dictator> Dionisio David MD ELECTRONICALLY SIGNED 10/20/2011 15:47

## 2014-09-03 NOTE — Consult Note (Signed)
Consult dictated, 14 YOWM came with Syncopal episode. Denies chest pain, EKG is normal. Had similar episode in march at [facility], and had PCI with DES in mid RCA and EP study revealing caroted hypersensitivity and non-sustained V. Tach. Advise watching him on telemetry and get neurology consult and may need cardiac workup by cardiac cath,.  Electronic Signatures: Angelica Ran (MD)  (Signed on 01-Jun-13 11:34)  Authored  Last Updated: 01-Jun-13 11:34 by Angelica Ran (MD)

## 2014-09-03 NOTE — Consult Note (Signed)
PATIENT NAME:  John Frank, John Frank MR#:  [id number] DATE OF BIRTH:  [birth year]  DATE OF CONSULTATION:  10/11/2011  REFERRING PHYSICIAN:  Dr. Pearletha Furl  CONSULTING PHYSICIAN:  Eldrick Penick B. Salvator Seppala, MD  REASON FOR CONSULTATION: Syncope.  HISTORY OF PRESENT ILLNESS: John Frank is a 59 year old male who presented to the Emergency Department after an episode of syncope resulting in a head laceration on the back of his head. He says he was in his usual state of health until yesterday evening when he was taking out the garbage and going to have a cigarette. He says he began coughing which is normal for him given his history of smoking. The onset of coughing then led to feeling like he was going to fall over. He felt dizzy. He says typically he is able to hold on to something and prevent himself from falling or getting injured, however, he ended up falling backwards and striking his head on the ground. He sustained a laceration to the back of his head. He is not sure how long he lost consciousness for because he was alone. However, he thinks it was quite brief. When he awoke he was on the ground and knew who he was or where he was. He did not feel confused after the event. He denied tongue biting or urinary incontinence or bowel incontinence. He did not complain of any muscle aches or pains today. He currently feels back to his normal self. He says he has had several similar episodes in the past. The majority of them are brought on by coughing, however, some have been brought on by stress. He has had an automatic implantable defibrillator placed two months ago. He also has numerous cardiac problems that are discussed in the past medical history. He does not have a history of seizures. He denies family history of seizures as well.   REVIEW OF SYSTEMS: A complete 14 point review of systems is negative other than what is mentioned in the history of present illness.   PAST MEDICAL HISTORY: 1. Borderline  diabetes. 2. Hypertension. 3. Hyperlipidemia. 4. Coronary artery disease. 5. Chronic obstructive pulmonary disease.  6. Dilated cardiomyopathy.  7. History of nonsustained monomorphic ventricular tachycardia status post defibrillator implantation.   SOCIAL HISTORY: Smokes 3 to 4 packs per day. He denies alcohol or illicits.   FAMILY HISTORY: No family history of seizures.   ALLERGIES: None.   CURRENT MEDICATIONS: 1. Aspirin 81 mg daily. 2. Lipitor 10 mg daily. 3. Plavix 75 mg daily. 4. Enalapril 5 mg daily. 5. Famotidine 40 mg daily. 6. Metoprolol 50 mg daily. 7. Spiriva. 8. Tylenol.   PHYSICAL EXAMINATION: VITAL SIGNS: Temperature 98.3, pulse 73, respirations 16, blood pressure 105/69, pulse ox 95% on room air.  GENERAL: Sitting in the chair, no apparent distress, well.  CARDIOVASCULAR: Regular rate and rhythm.   RESPIRATORY: Clear to auscultation anteriorly.   ABDOMEN: Soft.  SKIN: No rashes or lesions.  MENTAL STATUS: Alert and oriented x4. Naming and repetition intact. Speech is fluent. No dysarthria.   CRANIAL NERVES: Pupils equal, round, reactive to light. Full visual fields. Extraocular movements are intact. No facial or sensory deficits. Facial expression symmetric. Shoulder shrug 5/5 bilaterally to midline.   MOTOR: No pronator drift. Strength 5/5 throughout. Normal tone and bulk.   DEEP TENDON REFLEXES: 2+/4 throughout except absent ankle jerk bilaterally. Toes are downgoing.   COORDINATION: No ataxia, dysmetria. Bilateral finger to nose, heel to shin. Rapid alternating movements are normal.  SENSATION: No deficits to light touch  or temperature throughout.   GAIT: Normal gait.   LABORATORY, DIAGNOSTIC AND RADIOLOGICAL DATA: Noncontrast head CT on 10/10/2011 shows no acute abnormalities.   CBC and BMP are unremarkable. Lipid profile reveals total cholesterol 113, triglycerides 271, HDL 19, LDL 40. Troponin is negative. TSH is slightly elevated at 4.68.    LFTs are within normal limits.   ASSESSMENT: John Frank is a 59 year old Caucasian male with a significant cardiac history that includes cardiomyopathy, nonsustained ventricular tachycardia, cardiac stents, defibrillator implantation who presents with a recurrent syncopal event. Cardiac workup has been unremarkable with normal EKG and negative cardiac enzymes. Given his cardiac history it appears that the syncopal event was most likely related to an arrhythmia. Suspicion for a seizure was quite low given that he did not have any incontinence, tongue biting or confusion after the event. However, all of his events have been unwitnessed. It also appears that the event was brought on by coughing which would not trigger a seizure. The differential diagnoses does include carotid stenosis or vertebrobasilar insufficiency.   RECOMMENDATIONS:  1. Obtain CT angiogram of the head and neck to evaluate for stenosis in the anteroposterior circulation.  2. If CT angiogram of the head and neck is unremarkable no further neurological workup is recommended at this time.       Thank you for this consultation.   ____________________________ Micheline Maze Sandip Power, MD cbs:cms D: 10/11/2011 13:36:22 ET T: 10/11/2011 13:46:09 ET JOB#: [id number]  cc: Hutton Pellicane B. Steele Berg, MD, <Dictator> Arelia Sneddon MD ELECTRONICALLY SIGNED 10/12/2011 13:19

## 2014-09-03 NOTE — H&P (Signed)
PATIENT NAME:  John Frank, HOLLEY MR#:  [id number] DATE OF BIRTH:  February 16, 1956  DATE OF ADMISSION:  10/11/2011  PRIMARY CARE PHYSICIAN: Dr. Humphrey Rolls and Dr. Elijio Miles ER PHYSICIAN: Dr. Jimmye Norman.   ADMITTING PHYSICIAN: Dr. Pearletha Furl.   PRESENTING COMPLAINT: Loss of consciousness.   HISTORY: The patient is a 59 year old male who was in usual state of health until this evening when he passed out while trying to take out some garbage. Patient stated last thing he remembered was walking toward the garbage and then woke up and found himself on the floor with blood gushing from his head. Denies any palpitations. No PND, orthopnea, or pedal edema prior to the episode. No history of chest pain associated with this. Denies any confusion, tongue biting, incontinence. For this he was brought to the emergency room where he was noted to have a 2.5 cm laceration on the posterior surface of his scalp, which was sutured here in the emergency room and referred to hospitalist for further evaluation. Of note, the patient has had prior history of syncopal episodes following which he had an automatic implantable cardiac defibrillator placed 2 months ago at [facility]. Denies any recent change in medication. No sick contact. No rashes. No trauma prior to current event. No dysuria or frequency.    REVIEW OF SYSTEMS: CONSTITUTIONAL: Positive for generalized body ache and fatigue but denies any fever. No weight loss or weight gain. EYES: No blurred vision or discharge or redness. ENT: No tinnitus, ear pain, hearing loss, epistaxis, or difficulty swallowing. RESPIRATORY: Admits to nonproductive cough with wheezing but no painful respiration. CARDIOVASCULAR: Denies any chest pain. No orthopnea, no pedal edema. No palpitations . Positive for syncope which he had today. GI: No nausea, vomiting, diarrhea, abdominal pain, change in bowel habits. GU: No dysuria, frequency, incontinence. ENDOCRINE: No polyuria, polydipsia, heat or cold intolerance,  or excessive thirst. HEMATOLOGIC: No anemia, easy bruising, or swollen glands. SKIN: No rashes, change in hair or skin texture but has laceration on the posterior area of the scalp. MUSCULOSKELETAL: Positive for joint aches but no redness or limited activity. NEUROLOGIC: No numbness, epilepsy, no tremors. Had some mild headache following the suture in the ER. No memory loss. PSYCHIATRIC: No anxiety, depression.   PAST MEDICAL HISTORY: History of borderline diabetes, hypertension, hyperlipidemia. History of recurrent syncopal episodes for which he has been worked up several times in the past by primary care doctor. History of coronary artery disease, status post myocardial infarction and stent placement 2 months ago at [facility] and subsequent automatic implantable cardiac defibrillator placement.   SOCIAL HISTORY: Lives at home with a girlfriend. Admits to smoking 3 to 4 packs of cigarettes per day. No other recreational drug use. Retired from [employer] work.   FAMILY HISTORY: Positive for hypertension.   ALLERGIES: No known drug allergies.   MEDICATIONS:  1. Aspirin 81 mg p.o. daily.  2. Atorvastatin 10 mg daily.  3. Plavix 75 mg daily.  4. Enalapril 5 mg daily.  5. Famotidine 40 mg daily.  6. Metoprolol 50 mg daily.   7. Spiriva 18 mcg daily.  8. Tylenol Extra Strength 500 mg 2 capsules daily p.r.n.   PHYSICAL EXAMINATION:  VITAL SIGNS: Temperature 98.7, pulse 94, respiratory rate is 18, blood pressure 136/76, saturations are 97% on room air.   GENERAL: Middle-aged male lying on the gurney, awake, alert, oriented to time, place, and person, in no distress. Family at bedside, supportive.   HEENT: Normocephalic. Pupils equal, reactive to light and accommodation. Has  a 2.5 cm laceration on the occipital part of his scalp which was stapled. Pupils are equal, round and reactive to light and accommodation. Extraocular movement intact. Mucous membranes pink, moist.   NECK: Supple. No JV  distention.   CHEST: Good air entry. Few transmitted breath sounds, intermittent rhonchi, no rales.   HEART: Regular rate and rhythm. No murmurs. Automatic implantable cardiac defibrillator pocket intact. No swelling. No deformity or discharge.   ABDOMEN: Soft, nontender. Bowel sounds normoactive. No organomegaly.   EXTREMITIES: No edema, clubbing, deformity.   NEUROLOGICAL: No focal motor or sensory deficits.   PSYCHIATRIC: Affect appropriate to situation.   DATA: EKG shows sinus rhythm, rate of 87. CT head showed no acute intracranial abnormality.   LABORATORIES: CBC unremarkable, white count 8, hemoglobin 14, platelets 197,000. Chemistry is unremarkable: Sodium 136, potassium 3.8, creatinine of 1.2, BUN 17, glucose 132, calcium 8.9. Normal LFTs. Troponin negative.  INR 0.9 with PT of 12. Please note there are no further records in the system regarding his echocardiogram, stent placement, and these records have been requested from the ER from [place]. Patient says he had all these tests done within the last 2 months.   IMPRESSION:  1. Syncope, query cause. Differential includes orthostatics versus vasovagal, to rule out arrhythmia versus seizure though less likely.  2. History of coronary artery disease, status post stent placement about 2 months ago and automatic implantable cardiac defibrillator placement.  3. Hypertension, stable.  4. Hyperlipidemia, stable.  5. Borderline diabetes, stable.   6. Tobacco misuse noted.   7. Soft tissue injury from scalp laceration from trauma, stable.   PLAN: Admit to general medical floor. Per Neurology, watch today.  Telemonitoring. Follow up his reports from [facility]. We got an echocardiogram, carotid Dopplers.  In the meantime for orthostatic, vitals q. shift. For serial cardiac enzymes, TSH, magnesium, fasting lipid profile. Cardiology evaluation in a.m. Nicotine patch offered. Smoking cessation advised. Respiratory support with p.r.n.  oxygen,  nebulizer treatment p.r.n., aspirin, Plavix. Continue outpatient medications and adjust as needed.     CODE STATUS: FULL CODE.   TOTAL PATIENT CARE TIME: 50 minutes.      ____________________________ Jules Husbands Pearletha Furl, MD mia:vtd D: 10/10/2011 23:44:48 ET T: 10/11/2011 09:40:50 ET JOB#: [id number]  cc: Izzabell Klasen I. Pearletha Furl, MD, <Dictator> Sheikh A. Elijio Miles, MD Carola Frost MD ELECTRONICALLY SIGNED 10/13/2011 4:38

## 2014-09-03 NOTE — Consult Note (Signed)
Patient had cardiac cath which showed LAD mid 40 %, LCX mid 40 %, and RCA proximal 40 % restenosis of stent with LVEF 30 %. No significant CAD to explain syncope and neurology w/u negative. May go home with f/u office thursday 3pm.  Electronic Signatures: Angelica Ran (MD)  (Signed on 03-Jun-13 09:32)  Authored  Last Updated: 03-Jun-13 09:32 by Angelica Ran (MD)

## 2014-09-03 NOTE — Discharge Summary (Signed)
PATIENT NAME:  John Frank, John Frank MR#:  [id number] DATE OF BIRTH:  10-23-1955  DATE OF ADMISSION:  10/11/2011 DATE OF DISCHARGE:    DIAGNOSIS: Syncope.   PROCEDURES:  1. Left heart catheterization. 2. CT angiography of the head and neck with and without contrast. 3. CT scan of the head without contrast.  CONSULTATIONS:  1. Cardiology, Dr. Neoma Laming  2. Neurology, Dr. Eulogio Ditch   DISCHARGE MEDICATIONS: The patient is to resume home medications   HOSPITAL COURSE: The patient was admitted with a history of syncopal event prior to admission. He sustained a closed head injury requiring stapling of his scalp laceration. See history and physical for full details. Following admission to the hospital, he was evaluated by Neurology who attributed his symptoms more likely to hemodynamic issues such as vertebrobasilar insufficiency. CTA of the brain did not show any significant stenosis, however, and Neurology signed off. He was evaluated by Cardiology, Dr. Neoma Laming, who performed left heart catheterization which did not show in-stent stenosis. The patient recently had ICD placed several months ago with evidence of wide complex tachycardia. The patient is being discharged to home in stable condition.   DIET: Low sodium, low fat.   ACTIVITY: As tolerated.   FOLLOW-UP:  1. Follow-up with Dr. Neoma Laming on 10/15/2011. 2. Follow-up with Dr. Elijio Miles in two weeks.  3. The patient was instructed to call his primary care physician or return to the Emergency Room if develops any further syncopal events.   TIME SPENT ON DISCHARGE: 35 minutes.   ____________________________ Venetia Maxon Elijio Miles, MD sat:drc D: 10/13/2011 13:30:11 ET T: 10/13/2011 13:37:29 ET JOB#: [id number]  cc: Alfredia Ferguson A. Elijio Miles, MD, <Dictator> Veverly Fells MD ELECTRONICALLY SIGNED 11/10/2011 13:31

## 2014-09-19 ENCOUNTER — Encounter: Payer: Self-pay | Admitting: *Deleted

## 2014-09-19 ENCOUNTER — Telehealth: Payer: Self-pay | Admitting: Gastroenterology

## 2014-09-19 ENCOUNTER — Ambulatory Visit: Payer: Commercial Managed Care - HMO | Admitting: Anesthesiology

## 2014-09-19 ENCOUNTER — Ambulatory Visit
Admission: RE | Admit: 2014-09-19 | Discharge: 2014-09-19 | Disposition: A | Discharge status: Home / Self Care | Payer: Commercial Managed Care - HMO | Source: Ambulatory Visit | Attending: Gastroenterology | Admitting: Gastroenterology

## 2014-09-19 ENCOUNTER — Encounter
Admission: RE | Disposition: A | Discharge status: Home / Self Care | Payer: Self-pay | Source: Ambulatory Visit | Attending: Gastroenterology

## 2014-09-19 DIAGNOSIS — Z7902 Long term (current) use of antithrombotics/antiplatelets: Secondary | ICD-10-CM | POA: Diagnosis not present

## 2014-09-19 DIAGNOSIS — I10 Essential (primary) hypertension: Secondary | ICD-10-CM | POA: Insufficient documentation

## 2014-09-19 DIAGNOSIS — Z7982 Long term (current) use of aspirin: Secondary | ICD-10-CM | POA: Diagnosis not present

## 2014-09-19 DIAGNOSIS — I252 Old myocardial infarction: Secondary | ICD-10-CM | POA: Insufficient documentation

## 2014-09-19 DIAGNOSIS — R1310 Dysphagia, unspecified: Secondary | ICD-10-CM | POA: Insufficient documentation

## 2014-09-19 DIAGNOSIS — Z950 Presence of cardiac pacemaker: Secondary | ICD-10-CM | POA: Insufficient documentation

## 2014-09-19 DIAGNOSIS — K219 Gastro-esophageal reflux disease without esophagitis: Secondary | ICD-10-CM | POA: Diagnosis not present

## 2014-09-19 DIAGNOSIS — E119 Type 2 diabetes mellitus without complications: Secondary | ICD-10-CM | POA: Diagnosis not present

## 2014-09-19 DIAGNOSIS — Z79899 Other long term (current) drug therapy: Secondary | ICD-10-CM | POA: Insufficient documentation

## 2014-09-19 DIAGNOSIS — E785 Hyperlipidemia, unspecified: Secondary | ICD-10-CM | POA: Diagnosis not present

## 2014-09-19 DIAGNOSIS — F17210 Nicotine dependence, cigarettes, uncomplicated: Secondary | ICD-10-CM | POA: Insufficient documentation

## 2014-09-19 DIAGNOSIS — H00031 Abscess of right upper eyelid: Secondary | ICD-10-CM | POA: Diagnosis not present

## 2014-09-19 HISTORY — DX: Hyperlipidemia, unspecified: E78.5

## 2014-09-19 HISTORY — PX: ESOPHAGOGASTRODUODENOSCOPY: SHX5428

## 2014-09-19 HISTORY — DX: Reserved for inherently not codable concepts without codable children: IMO0001

## 2014-09-19 HISTORY — DX: Presence of cardiac pacemaker: Z95.0

## 2014-09-19 HISTORY — DX: Essential (primary) hypertension: I10

## 2014-09-19 HISTORY — DX: Gastro-esophageal reflux disease without esophagitis: K21.9

## 2014-09-19 LAB — GLUCOSE, CAPILLARY: Glucose-Capillary: 130 mg/dL — ABNORMAL HIGH (ref 70–99)

## 2014-09-19 SURGERY — EGD (ESOPHAGOGASTRODUODENOSCOPY)
Anesthesia: General

## 2014-09-19 MED ORDER — MIDAZOLAM HCL 2 MG/2ML IJ SOLN
INTRAMUSCULAR | Status: DC | PRN
  Administered 2014-09-19: 1 mg via INTRAVENOUS

## 2014-09-19 MED ORDER — PROPOFOL 10 MG/ML IV BOLUS
INTRAVENOUS | Status: DC | PRN
  Administered 2014-09-19 (×2): 20 mg via INTRAVENOUS

## 2014-09-19 MED ORDER — PROPOFOL INFUSION 10 MG/ML OPTIME
INTRAVENOUS | Status: DC | PRN
  Administered 2014-09-19: 100 ug/kg/min via INTRAVENOUS

## 2014-09-19 MED ORDER — SODIUM CHLORIDE 0.9 % IV SOLN: INTRAVENOUS | Status: DC

## 2014-09-19 MED ORDER — LIDOCAINE HCL (PF) 1 % IJ SOLN
2.0000 mL | Freq: Once | INTRAMUSCULAR | Status: AC
  Administered 2014-09-19: 2 mL via INTRADERMAL

## 2014-09-19 MED ORDER — SODIUM CHLORIDE 0.9 % IV SOLN
INTRAVENOUS | Status: DC
  Administered 2014-09-19: 1000 mL via INTRAVENOUS
  Administered 2014-09-19: 12:00:00 via INTRAVENOUS

## 2014-09-19 NOTE — Anesthesia Postprocedure Evaluation (Signed)
  Anesthesia Post-op Note  Patient: John Frank  Procedure(s) Performed: Procedure(s): ESOPHAGOGASTRODUODENOSCOPY (EGD) (N/A)  Anesthesia type:General  Patient location: PACU  Post pain: Pain level controlled  Post assessment: Post-op Vital signs reviewed, Patient's Cardiovascular Status Stable, Respiratory Function Stable, Patent Airway and No signs of Nausea or vomiting  Post vital signs: Reviewed and stable  Last Vitals:  Filed Vitals:   09/19/14 1100  BP: 115/76  Pulse: 70  Temp: 36.6 C  Resp: 14    Level of consciousness: awake, alert  and patient cooperative  Complications: No apparent anesthesia complications

## 2014-09-19 NOTE — Anesthesia Postprocedure Evaluation (Signed)
  Anesthesia Post-op Note  Patient: John Frank  Procedure(s) Performed: Procedure(s): ESOPHAGOGASTRODUODENOSCOPY (EGD) (N/A)  Anesthesia type:General  Patient location: PACU  Post pain: Pain level controlled  Post assessment: Post-op Vital signs reviewed, Patient's Cardiovascular Status Stable, Respiratory Function Stable, Patent Airway and No signs of Nausea or vomiting  Post vital signs: Reviewed and stable  Last Vitals:  Filed Vitals:   09/19/14 1320  BP: 86/75  Pulse: 96  Temp:   Resp: 20    Level of consciousness: awake, alert  and patient cooperative  Complications: No apparent anesthesia complications

## 2014-09-19 NOTE — H&P (Signed)
  [facility]  [address] [place], [place] [address] Phone: [phone] Fax : [phone]  Primary Care Physician:  Golden Pop, MD Primary Gastroenterologist:  Dr. Allen Norris  Pre-Procedure History & Physical: HPI:  John Frank is a 59 y.o. male is here for an endoscopy.   Past Medical History  Diagnosis Date  . Diabetes   . Heart attack   . Shortness of breath dyspnea   . Hyperlipidemia   . Hypertension   . GERD (gastroesophageal reflux disease)   . Presence of permanent cardiac pacemaker     Past Surgical History  Procedure Laterality Date  . Cardiac surgery      Prior to Admission medications   Medication Sig Start Date End Date Taking? Authorizing Provider  aspirin 81 MG tablet Take 81 mg by mouth daily.   Yes Historical Provider, MD  atorvastatin (LIPITOR) 10 MG tablet  05/17/13  Yes Historical Provider, MD  enalapril (VASOTEC) 5 MG tablet  07/11/13  Yes Historical Provider, MD  famotidine (PEPCID) 40 MG tablet  06/08/13  Yes Historical Provider, MD  ipratropium-albuterol (DUONEB) 0.5-2.5 (3) MG/3ML SOLN Take 3 mLs by nebulization every 6 (six) hours as needed (wheezing; shortness of breath).   Yes Historical Provider, MD  metoprolol succinate (TOPROL-XL) 50 MG 24 hr tablet  06/07/13  Yes Historical Provider, MD  pantoprazole (PROTONIX) 40 MG tablet  07/14/13  Yes Historical Provider, MD  Blood Glucose Monitoring Suppl (ONE TOUCH ULTRA 2) W/DEVICE KIT  07/14/13   Historical Provider, MD  clopidogrel (PLAVIX) 75 MG tablet  05/17/13   Historical Provider, MD  SPIRIVA HANDIHALER 18 MCG inhalation capsule  07/09/13   Historical Provider, MD    Allergies as of 09/18/2014  . (No Known Allergies)    History reviewed. No pertinent family history.  History   Social History  . Marital Status: Single    Spouse Name: N/A  . Number of Children: N/A  . Years of Education: N/A   Occupational History  . Not on file.   Social History Main Topics  . Smoking  status: Current Every Day Smoker -- 0.50 packs/day for 40 years    Types: Cigarettes  . Smokeless tobacco: Not on file  . Alcohol Use: No  . Drug Use: Not on file  . Sexual Activity: Not on file   Other Topics Concern  . Not on file   Social History Narrative    Review of Systems: See HPI, otherwise negative ROS  Physical Exam: BP 115/76 mmHg  Pulse 70  Temp(Src) 97.8 F (36.6 C) (Tympanic)  Resp 14  Ht $R'5\' 4"'dH$  (1.626 m)  Wt 145 lb (65.772 kg)  BMI 24.88 kg/m2  SpO2 95% General:   Alert,  pleasant and cooperative in NAD Head:  Normocephalic and atraumatic. Neck:  Supple; no masses or thyromegaly. Lungs:  Clear throughout to auscultation.    Heart:  Regular rate and rhythm. Abdomen:  Soft, nontender and nondistended. Normal bowel sounds, without guarding, and without rebound.   Neurologic:  Alert and  oriented x4;  grossly normal neurologically.  Impression/Plan: John Frank is here for an endoscopy to be performed for dysphagia  Risks, benefits, limitations, and alternatives regarding  endoscopy have been reviewed with the patient.  Questions have been answered.  All parties agreeable.   [facility], MD  09/19/2014, 11:46 AM

## 2014-09-19 NOTE — Op Note (Signed)
[facility] Patient Name: John Frank Procedure Date: 09/19/2014 12:28 PM MRN: [id number] Account #: [id number] Date of Birth: [birth year] Admit Type: Outpatient Age: 59 Room: [facility] ENDO ROOM 4 Gender: Male Note Status: Finalized Procedure:         Upper GI endoscopy Indications:       Dysphagia Providers:         Lucilla Lame, MD Referring MD:      Guadalupe Maple, MD (Referring MD) Medicines:         Propofol per Anesthesia Complications:     No immediate complications. Procedure:         Pre-Anesthesia Assessment:                    - Prior to the procedure, a History and Physical was                     performed, and patient medications and allergies were                     reviewed. The patient's tolerance of previous anesthesia                     was also reviewed. The risks and benefits of the procedure                     and the sedation options and risks were discussed with the                     patient. All questions were answered, and informed consent                     was obtained. Prior Anticoagulants: The patient has taken                     no previous anticoagulant or antiplatelet agents. ASA                     Grade Assessment: II - A patient with mild systemic                     disease. After reviewing the risks and benefits, the                     patient was deemed in satisfactory condition to undergo                     the procedure.                    After obtaining informed consent, the endoscope was passed                     under direct vision. Throughout the procedure, the                     patient's blood pressure, pulse, and oxygen saturations                     were monitored continuously. The Olympus GIF-160 endoscope                     (S#. [id number]) was introduced through the mouth, and  advanced to the second part of duodenum. The upper GI                     endoscopy was  accomplished without difficulty. The patient                     tolerated the procedure well. Findings:      The examined esophagus was normal. Random biopsies were obtained in the       middle third of the esophagus with cold forceps for histology.      The stomach was normal.      The examined duodenum was normal. Impression:        - Normal esophagus.                    - Normal stomach.                    - Normal examined duodenum.                    - Random biopsies were obtained in the middle third of the                     esophagus. Recommendation:    - Await pathology results. Procedure Code(s): --- Professional ---                    [phone], Esophagogastroduodenoscopy, flexible, transoral;                     with biopsy, single or multiple Diagnosis Code(s): --- Professional ---                    R13.10, Dysphagia, unspecified CPT copyright 2014 American Medical Association. All rights reserved. The codes documented in this report are preliminary and upon coder review may  be revised to meet current compliance requirements. Lucilla Lame, MD 09/19/2014 12:39:56 PM This report has been signed electronically. Number of Addenda: 0 Note Initiated On: 09/19/2014 12:28 PM      [facility]

## 2014-09-19 NOTE — Transfer of Care (Signed)
Immediate Anesthesia Transfer of Care Note  Patient: John Frank  Procedure(s) Performed: Procedure(s): ESOPHAGOGASTRODUODENOSCOPY (EGD) (N/A)  Patient Location: PACU  Anesthesia Type:GA  Level of Consciousness: awake  Airway & Oxygen Therapy: Patient Spontanous Breathing and Patient connected to nasal cannula oxygen  Post-op Assessment: Report given to RN and Post -op Vital signs reviewed and stable  Post vital signs: Reviewed and stable  Last Vitals:  Filed Vitals:   09/19/14 1100  BP: 115/76  Pulse: 70  Temp: 36.6 C  Resp: 14    Complications: No apparent anesthesia complications

## 2014-09-19 NOTE — OR Nursing (Signed)
Care transferred to [facility] RN

## 2014-09-19 NOTE — Anesthesia Preprocedure Evaluation (Addendum)
Anesthesia Evaluation    Airway Mallampati: II       Dental  (+) Lower Dentures, Upper Dentures   Pulmonary shortness of breath, Current Smoker,          Cardiovascular hypertension, + Past MI + pacemaker + Cardiac Defibrillator Rhythm:regular     Neuro/Psych    GI/Hepatic   Endo/Other  diabetes  Renal/GU      Musculoskeletal   Abdominal   Peds  Hematology   Anesthesia Other Findings   Reproductive/Obstetrics                            Anesthesia Physical Anesthesia Plan  ASA: III  Anesthesia Plan: General   Post-op Pain Management:    Induction:   Airway Management Planned:   Additional Equipment:   Intra-op Plan:   Post-operative Plan:   Informed Consent: I have reviewed the patients History and Physical, chart, labs and discussed the procedure including the risks, benefits and alternatives for the proposed anesthesia with the patient or authorized representative who has indicated his/her understanding and acceptance.     Plan Discussed with:   Anesthesia Plan Comments:         Anesthesia Quick Evaluation

## 2014-09-20 ENCOUNTER — Encounter: Payer: Self-pay | Admitting: Gastroenterology

## 2014-09-20 LAB — SURGICAL PATHOLOGY

## 2014-09-21 NOTE — Telephone Encounter (Signed)
Error

## 2014-09-29 DIAGNOSIS — L400 Psoriasis vulgaris: Secondary | ICD-10-CM | POA: Diagnosis not present

## 2014-09-29 DIAGNOSIS — D692 Other nonthrombocytopenic purpura: Secondary | ICD-10-CM | POA: Diagnosis not present

## 2014-09-29 DIAGNOSIS — D1800 Hemangioma unspecified site: Secondary | ICD-10-CM | POA: Diagnosis not present

## 2014-10-25 DIAGNOSIS — H00031 Abscess of right upper eyelid: Secondary | ICD-10-CM | POA: Diagnosis not present

## 2014-10-26 DIAGNOSIS — E785 Hyperlipidemia, unspecified: Secondary | ICD-10-CM | POA: Insufficient documentation

## 2014-10-26 DIAGNOSIS — Z950 Presence of cardiac pacemaker: Secondary | ICD-10-CM | POA: Insufficient documentation

## 2014-10-26 DIAGNOSIS — K219 Gastro-esophageal reflux disease without esophagitis: Secondary | ICD-10-CM | POA: Insufficient documentation

## 2014-10-26 DIAGNOSIS — J449 Chronic obstructive pulmonary disease, unspecified: Secondary | ICD-10-CM | POA: Insufficient documentation

## 2014-10-26 DIAGNOSIS — G4730 Sleep apnea, unspecified: Secondary | ICD-10-CM | POA: Insufficient documentation

## 2014-10-26 DIAGNOSIS — I2510 Atherosclerotic heart disease of native coronary artery without angina pectoris: Secondary | ICD-10-CM | POA: Insufficient documentation

## 2014-10-26 DIAGNOSIS — E119 Type 2 diabetes mellitus without complications: Secondary | ICD-10-CM | POA: Insufficient documentation

## 2014-10-26 DIAGNOSIS — I10 Essential (primary) hypertension: Secondary | ICD-10-CM | POA: Insufficient documentation

## 2014-10-26 DIAGNOSIS — J3489 Other specified disorders of nose and nasal sinuses: Secondary | ICD-10-CM | POA: Insufficient documentation

## 2014-10-26 DIAGNOSIS — K635 Polyp of colon: Secondary | ICD-10-CM | POA: Insufficient documentation

## 2014-10-30 DIAGNOSIS — H00031 Abscess of right upper eyelid: Secondary | ICD-10-CM | POA: Diagnosis not present

## 2014-11-02 DIAGNOSIS — H00031 Abscess of right upper eyelid: Secondary | ICD-10-CM | POA: Diagnosis not present

## 2014-11-03 ENCOUNTER — Ambulatory Visit (INDEPENDENT_AMBULATORY_CARE_PROVIDER_SITE_OTHER): Payer: Commercial Managed Care - HMO | Admitting: Unknown Physician Specialty

## 2014-11-03 ENCOUNTER — Encounter: Payer: Self-pay | Admitting: Unknown Physician Specialty

## 2014-11-03 VITALS — BP 138/84 | HR 71 | Temp 97.5°F | Ht 63.1 in | Wt 168.4 lb

## 2014-11-03 DIAGNOSIS — I2510 Atherosclerotic heart disease of native coronary artery without angina pectoris: Secondary | ICD-10-CM | POA: Diagnosis not present

## 2014-11-03 DIAGNOSIS — I10 Essential (primary) hypertension: Secondary | ICD-10-CM

## 2014-11-03 DIAGNOSIS — I119 Hypertensive heart disease without heart failure: Secondary | ICD-10-CM

## 2014-11-03 DIAGNOSIS — E119 Type 2 diabetes mellitus without complications: Secondary | ICD-10-CM

## 2014-11-03 DIAGNOSIS — E785 Hyperlipidemia, unspecified: Secondary | ICD-10-CM

## 2014-11-03 DIAGNOSIS — J449 Chronic obstructive pulmonary disease, unspecified: Secondary | ICD-10-CM

## 2014-11-03 LAB — LIPID PANEL PICCOLO, WAIVED
CHOLESTEROL PICCOLO, WAIVED: 115 mg/dL (ref <200)
Chol/HDL Ratio Piccolo,Waive: 6.5 mg/dL — ABNORMAL HIGH
HDL CHOL PICCOLO, WAIVED: 18 mg/dL — AB (ref >59)
LDL Chol Calc Piccolo Waived: 51 mg/dL (ref <100)
TRIGLYCERIDES PICCOLO,WAIVED: 232 mg/dL — AB (ref <150)
VLDL CHOL CALC PICCOLO,WAIVE: 46 mg/dL — AB (ref <30)

## 2014-11-03 LAB — MICROALBUMIN, URINE WAIVED
Creatinine, Urine Waived: 100 mg/dL (ref 10–300)
Microalb, Ur Waived: 10 mg/L (ref 0–19)

## 2014-11-03 LAB — BAYER DCA HB A1C WAIVED: HB A1C: 7.3 % — AB (ref <7.0)

## 2014-11-03 MED ORDER — SITAGLIPTIN PHOSPHATE 100 MG PO TABS: 100.0000 mg | ORAL_TABLET | Freq: Every day | ORAL | Status: DC

## 2014-11-03 NOTE — Patient Instructions (Signed)
Diabetes Mellitus and Food It is important for you to manage your blood sugar (glucose) level. Your blood glucose level can be greatly affected by what you eat. Eating healthier foods in the appropriate amounts throughout the day at about the same time each day will help you control your blood glucose level. It can also help slow or prevent worsening of your diabetes mellitus. Healthy eating may even help you improve the level of your blood pressure and reach or maintain a healthy weight.  HOW CAN FOOD AFFECT ME? Carbohydrates Carbohydrates affect your blood glucose level more than any other type of food. Your dietitian will help you determine how many carbohydrates to eat at each meal and teach you how to count carbohydrates. Counting carbohydrates is important to keep your blood glucose at a healthy level, especially if you are using insulin or taking certain medicines for diabetes mellitus. Alcohol Alcohol can cause sudden decreases in blood glucose (hypoglycemia), especially if you use insulin or take certain medicines for diabetes mellitus. Hypoglycemia can be a life-threatening condition. Symptoms of hypoglycemia (sleepiness, dizziness, and disorientation) are similar to symptoms of having too much alcohol.  If your health care provider has given you approval to drink alcohol, do so in moderation and use the following guidelines:  Women should not have more than one drink per day, and men should not have more than two drinks per day. One drink is equal to:  12 oz of beer.  5 oz of wine.  1 oz of hard liquor.  Do not drink on an empty stomach.  Keep yourself hydrated. Have water, diet soda, or unsweetened iced tea.  Regular soda, juice, and other mixers might contain a lot of carbohydrates and should be counted. WHAT FOODS ARE NOT RECOMMENDED? As you make food choices, it is important to remember that all foods are not the same. Some foods have fewer nutrients per serving than other  foods, even though they might have the same number of calories or carbohydrates. It is difficult to get your body what it needs when you eat foods with fewer nutrients. Examples of foods that you should avoid that are high in calories and carbohydrates but low in nutrients include:  Trans fats (most processed foods list trans fats on the Nutrition Facts label).  Regular soda.  Juice.  Candy.  Sweets, such as cake, pie, doughnuts, and cookies.  Fried foods. WHAT FOODS CAN I EAT? Have nutrient-rich foods, which will nourish your body and keep you healthy. The food you should eat also will depend on several factors, including:  The calories you need.  The medicines you take.  Your weight.  Your blood glucose level.  Your blood pressure level.  Your cholesterol level. You also should eat a variety of foods, including:  Protein, such as meat, poultry, fish, tofu, nuts, and seeds (lean animal proteins are best).  Fruits.  Vegetables.  Dairy products, such as milk, cheese, and yogurt (low fat is best).  Breads, grains, pasta, cereal, rice, and beans.  Fats such as olive oil, trans fat-free margarine, canola oil, avocado, and olives. DOES EVERYONE WITH DIABETES MELLITUS HAVE THE SAME MEAL PLAN? Because every person with diabetes mellitus is different, there is not one meal plan that works for everyone. It is very important that you meet with a dietitian who will help you create a meal plan that is just right for you. Document Released: 01/23/2005 Document Revised: 05/03/2013 Document Reviewed: 03/25/2013 Exit[employer] Patient Information 2015 [employer]. This   information is not intended to replace advice given to you by your health care provider. Make sure you discuss any questions you have with your health care provider.  

## 2014-11-03 NOTE — Assessment & Plan Note (Deleted)
Stable.  Continue presenttreatment

## 2014-11-03 NOTE — Assessment & Plan Note (Signed)
Reviewed lipid panel.  LDL is 51.  Continue present meds

## 2014-11-03 NOTE — Assessment & Plan Note (Signed)
On nebulizer treatments.  Bothered increasingly by cough.  Smoking cessation recommended.  Refer to pulmonologist

## 2014-11-03 NOTE — Progress Notes (Signed)
BP 138/84 mmHg  Pulse 71  Temp(Src) 97.5 F (36.4 C)  Ht 5' 3.1" (1.603 m)  Wt 168 lb 6.4 oz (76.386 kg)  BMI 29.73 kg/m2  SpO2 96%   Subjective:    Patient ID: John Frank, male    DOB: Oct 18, 1955, 59 y.o.   MRN: [id number]  HPI: John Frank is a 59 y.o. male  Chief Complaint  Patient presents with  . Hyperlipidemia  . Hypertension  . Diabetes  . Cough    pt states he coughs in the mornings and has to hold on to something because he looses his breath.    Hyperlipidemia This is a chronic problem. The problem is controlled. Exacerbating diseases include chronic renal disease, diabetes and obesity. He has no history of hypothyroidism or liver disease. Associated symptoms include chest pain and shortness of breath. Pertinent negatives include no myalgias. Current antihyperlipidemic treatment includes statins. The current treatment provides no improvement of lipids. Risk factors for coronary artery disease include diabetes mellitus, dyslipidemia, male sex and obesity.  Hypertension This is a chronic problem. Associated symptoms include chest pain and shortness of breath. Pertinent negatives include no palpitations. There are no associated agents to hypertension. The current treatment provides no improvement. Identifiable causes of hypertension include chronic renal disease.  Diabetes He presents for his initial diabetic visit. He has type 2 diabetes mellitus. His disease course has been stable. Associated symptoms include chest pain. Pertinent negatives for diabetes include no fatigue, no foot ulcerations, no polyuria and no weight loss. There are no hypoglycemic complications. There are no diabetic complications. He is compliant with treatment all of the time. His weight is stable. He monitors blood glucose at home 1-2 x per day. His breakfast blood glucose range is generally 130-140 mg/dl. An ACE inhibitor/angiotensin II receptor blocker is being taken. He does not see a podiatrist.Eye  exam is current.  Cough This is a chronic (Severe COPD on last spirometry.  uses nebs) problem. The problem has been gradually worsening. The cough is non-productive. Associated symptoms include chest pain, shortness of breath and wheezing. Pertinent negatives include no myalgias or weight loss. Nothing aggravates the symptoms. He has tried nothing for the symptoms. The treatment provided moderate relief. His past medical history is significant for COPD.    Relevant past medical, surgical, family and social history reviewed and updated as indicated. Interim medical history since our last visit reviewed. Allergies and medications reviewed and updated.  Review of Systems  Constitutional: Negative for weight loss and fatigue.  Respiratory: Positive for cough, shortness of breath and wheezing.   Cardiovascular: Positive for chest pain. Negative for palpitations.  Endocrine: Negative for polyuria.  Musculoskeletal: Negative for myalgias.    Per HPI unless specifically indicated above     Objective:    BP 138/84 mmHg  Pulse 71  Temp(Src) 97.5 F (36.4 C)  Ht 5' 3.1" (1.603 m)  Wt 168 lb 6.4 oz (76.386 kg)  BMI 29.73 kg/m2  SpO2 96%  Wt Readings from Last 3 Encounters:  11/03/14 168 lb 6.4 oz (76.386 kg)  19-Dec-2054 166 lb (75.297 kg) (100 %*, Z = 38.52)  09/19/14 145 lb (65.772 kg)   * Growth percentiles are based on WHO (Boys, 0-2 years) data.    Physical Exam  Constitutional: He is oriented to person, place, and time. He appears well-developed and well-nourished. No distress.  HENT:  Head: Normocephalic and atraumatic.  Eyes: Conjunctivae and lids are normal. Right eye exhibits no discharge. Left eye  exhibits no discharge. No scleral icterus.  Neck: Normal range of motion.  Cardiovascular: Normal rate and regular rhythm.   Pulmonary/Chest: Effort normal and breath sounds normal. No respiratory distress. He has no wheezes. He has no rales. He exhibits no tenderness.  Decreased  throughout  Abdominal: Normal appearance and bowel sounds are normal. He exhibits no distension. There is no splenomegaly or hepatomegaly. There is no tenderness.  Musculoskeletal: Normal range of motion.  Neurological: He is alert and oriented to person, place, and time.  Skin: Skin is intact. No rash noted. No pallor.  Psychiatric: He has a normal mood and affect. His behavior is normal. Judgment and thought content normal.   Assessment & Plan:   Problem List Items Addressed This Visit      Cardiovascular and Mediastinum   CAD (coronary artery disease) - Primary   Hypertension, accelerated with heart disease, without CHF    Stable.  Continue present medications      Relevant Orders   Lipid Panel Piccolo, Waived   Microalbumin, Urine Waived   Uric acid   Comprehensive metabolic panel   RESOLVED: Hypertension     Respiratory   COPD, severe    On nebulizer treatments.  Bothered increasingly by cough.  Smoking cessation recommended.  Refer to pulmonologist      Relevant Orders   Ambulatory referral to Pulmonology     Endocrine   Diabetes mellitus without complication    Not to goal with Hgb A1C of 7.3.  Will work on diet and increase activity.  Recheck in 3 months.  Pt tells me he has been without Januvia for 1 month.  Will restart.        Relevant Medications   sitaGLIPtin (JANUVIA) 100 MG tablet   Other Relevant Orders   Bayer DCA Hb A1c Waived   Microalbumin, Urine Waived   Uric acid   Comprehensive metabolic panel     Other   Hyperlipidemia    Reviewed lipid panel.  LDL is 51.  Continue present meds      Relevant Orders   Comprehensive metabolic panel       Follow up plan: Return in about 3 months (around 02/03/2015) for diabetes.   Will need Hgb A1C and CMP

## 2014-11-03 NOTE — Assessment & Plan Note (Signed)
Stable.  Continue present medications

## 2014-11-03 NOTE — Assessment & Plan Note (Addendum)
Not to goal with Hgb A1C of 7.3.  Will work on diet and increase activity.  Recheck in 3 months.  Pt tells me he has been without Januvia for 1 month.  Will restart.

## 2014-11-04 LAB — COMPREHENSIVE METABOLIC PANEL
A/G RATIO: 1.6 (ref 1.1–2.5)
ALK PHOS: 122 IU/L — AB (ref 39–117)
ALT: 11 IU/L (ref 0–44)
AST: 12 IU/L (ref 0–40)
Albumin: 4.1 g/dL (ref 3.5–5.5)
BILIRUBIN TOTAL: 0.3 mg/dL (ref 0.0–1.2)
BUN / CREAT RATIO: 10 (ref 9–20)
BUN: 10 mg/dL (ref 6–24)
CHLORIDE: 98 mmol/L (ref 97–108)
CO2: 25 mmol/L (ref 18–29)
Calcium: 9.5 mg/dL (ref 8.7–10.2)
Creatinine, Ser: 1 mg/dL (ref 0.76–1.27)
GFR calc Af Amer: 95 mL/min/{1.73_m2} (ref >59)
GFR, EST NON AFRICAN AMERICAN: 82 mL/min/{1.73_m2} (ref >59)
Globulin, Total: 2.6 g/dL (ref 1.5–4.5)
Glucose: 202 mg/dL — ABNORMAL HIGH (ref 65–99)
Potassium: 4.6 mmol/L (ref 3.5–5.2)
SODIUM: 138 mmol/L (ref 134–144)
Total Protein: 6.7 g/dL (ref 6.0–8.5)

## 2014-11-04 LAB — URIC ACID: URIC ACID: 5.7 mg/dL (ref 3.7–8.6)

## 2014-11-09 DIAGNOSIS — H00031 Abscess of right upper eyelid: Secondary | ICD-10-CM | POA: Diagnosis not present

## 2014-11-10 ENCOUNTER — Ambulatory Visit: Payer: Commercial Managed Care - HMO

## 2014-11-14 ENCOUNTER — Institutional Professional Consult (permissible substitution): Payer: Commercial Managed Care - HMO | Admitting: Internal Medicine

## 2014-11-21 ENCOUNTER — Ambulatory Visit: Payer: Commercial Managed Care - HMO

## 2014-11-23 DIAGNOSIS — H00031 Abscess of right upper eyelid: Secondary | ICD-10-CM | POA: Diagnosis not present

## 2014-12-24 ENCOUNTER — Encounter: Payer: Self-pay | Admitting: Internal Medicine

## 2014-12-24 ENCOUNTER — Inpatient Hospital Stay
Admission: EM | Admit: 2014-12-24 | Discharge: 2014-12-25 | DRG: 189 | Disposition: A | Discharge status: Home / Self Care | Payer: Commercial Managed Care - HMO | Attending: Internal Medicine | Admitting: Internal Medicine

## 2014-12-24 ENCOUNTER — Emergency Department: Payer: Commercial Managed Care - HMO

## 2014-12-24 DIAGNOSIS — J9601 Acute respiratory failure with hypoxia: Secondary | ICD-10-CM | POA: Diagnosis not present

## 2014-12-24 DIAGNOSIS — Z8249 Family history of ischemic heart disease and other diseases of the circulatory system: Secondary | ICD-10-CM

## 2014-12-24 DIAGNOSIS — Z716 Tobacco abuse counseling: Secondary | ICD-10-CM | POA: Diagnosis not present

## 2014-12-24 DIAGNOSIS — Z833 Family history of diabetes mellitus: Secondary | ICD-10-CM | POA: Diagnosis not present

## 2014-12-24 DIAGNOSIS — I10 Essential (primary) hypertension: Secondary | ICD-10-CM | POA: Diagnosis present

## 2014-12-24 DIAGNOSIS — Z79899 Other long term (current) drug therapy: Secondary | ICD-10-CM | POA: Diagnosis not present

## 2014-12-24 DIAGNOSIS — K219 Gastro-esophageal reflux disease without esophagitis: Secondary | ICD-10-CM | POA: Diagnosis not present

## 2014-12-24 DIAGNOSIS — E119 Type 2 diabetes mellitus without complications: Secondary | ICD-10-CM | POA: Diagnosis not present

## 2014-12-24 DIAGNOSIS — Z955 Presence of coronary angioplasty implant and graft: Secondary | ICD-10-CM | POA: Diagnosis not present

## 2014-12-24 DIAGNOSIS — E785 Hyperlipidemia, unspecified: Secondary | ICD-10-CM | POA: Diagnosis present

## 2014-12-24 DIAGNOSIS — J441 Chronic obstructive pulmonary disease with (acute) exacerbation: Secondary | ICD-10-CM | POA: Diagnosis not present

## 2014-12-24 DIAGNOSIS — Z7982 Long term (current) use of aspirin: Secondary | ICD-10-CM

## 2014-12-24 DIAGNOSIS — I2510 Atherosclerotic heart disease of native coronary artery without angina pectoris: Secondary | ICD-10-CM | POA: Diagnosis not present

## 2014-12-24 DIAGNOSIS — F17210 Nicotine dependence, cigarettes, uncomplicated: Secondary | ICD-10-CM | POA: Diagnosis not present

## 2014-12-24 DIAGNOSIS — Z9889 Other specified postprocedural states: Secondary | ICD-10-CM | POA: Diagnosis not present

## 2014-12-24 DIAGNOSIS — I5022 Chronic systolic (congestive) heart failure: Secondary | ICD-10-CM | POA: Diagnosis present

## 2014-12-24 DIAGNOSIS — R0602 Shortness of breath: Secondary | ICD-10-CM

## 2014-12-24 DIAGNOSIS — G4730 Sleep apnea, unspecified: Secondary | ICD-10-CM | POA: Diagnosis present

## 2014-12-24 DIAGNOSIS — Z950 Presence of cardiac pacemaker: Secondary | ICD-10-CM

## 2014-12-24 DIAGNOSIS — R05 Cough: Secondary | ICD-10-CM | POA: Diagnosis not present

## 2014-12-24 DIAGNOSIS — Z720 Tobacco use: Secondary | ICD-10-CM | POA: Diagnosis not present

## 2014-12-24 DIAGNOSIS — R079 Chest pain, unspecified: Secondary | ICD-10-CM | POA: Diagnosis not present

## 2014-12-24 DIAGNOSIS — I252 Old myocardial infarction: Secondary | ICD-10-CM | POA: Diagnosis not present

## 2014-12-24 DIAGNOSIS — F17200 Nicotine dependence, unspecified, uncomplicated: Secondary | ICD-10-CM | POA: Diagnosis not present

## 2014-12-24 LAB — CBC WITH DIFFERENTIAL/PLATELET
BASOS ABS: 0.1 10*3/uL (ref 0–0.1)
Basophils Relative: 1 %
Eosinophils Absolute: 0.2 10*3/uL (ref 0–0.7)
Eosinophils Relative: 2 %
HCT: 41.7 % (ref 40.0–52.0)
HEMOGLOBIN: 13.7 g/dL (ref 13.0–18.0)
LYMPHS ABS: 1.8 10*3/uL (ref 1.0–3.6)
Lymphocytes Relative: 17 %
MCH: 30 pg (ref 26.0–34.0)
MCHC: 32.9 g/dL (ref 32.0–36.0)
MCV: 91.3 fL (ref 80.0–100.0)
MONO ABS: 1.1 10*3/uL — AB (ref 0.2–1.0)
Monocytes Relative: 10 %
NEUTROS ABS: 7.6 10*3/uL — AB (ref 1.4–6.5)
NEUTROS PCT: 70 %
Platelets: 230 10*3/uL (ref 150–440)
RBC: 4.57 MIL/uL (ref 4.40–5.90)
RDW: 16.5 % — AB (ref 11.5–14.5)
WBC: 10.8 10*3/uL — AB (ref 3.8–10.6)

## 2014-12-24 LAB — BASIC METABOLIC PANEL
Anion gap: 12 (ref 5–15)
BUN: 12 mg/dL (ref 6–20)
CALCIUM: 9 mg/dL (ref 8.9–10.3)
CHLORIDE: 101 mmol/L (ref 101–111)
CO2: 24 mmol/L (ref 22–32)
Creatinine, Ser: 0.94 mg/dL (ref 0.61–1.24)
GFR calc Af Amer: >60 mL/min (ref >60)
Glucose, Bld: 188 mg/dL — ABNORMAL HIGH (ref 65–99)
POTASSIUM: 4.5 mmol/L (ref 3.5–5.1)
SODIUM: 137 mmol/L (ref 135–145)

## 2014-12-24 LAB — GLUCOSE, CAPILLARY
GLUCOSE-CAPILLARY: 300 mg/dL — AB (ref 65–99)
GLUCOSE-CAPILLARY: 421 mg/dL — AB (ref 65–99)
GLUCOSE-CAPILLARY: 226 mg/dL — AB (ref 65–99)
GLUCOSE-CAPILLARY: 319 mg/dL — AB (ref 65–99)

## 2014-12-24 LAB — TROPONIN I: Troponin I: <0.03 ng/mL (ref <0.031)

## 2014-12-24 MED ORDER — CLOPIDOGREL BISULFATE 75 MG PO TABS
75.0000 mg | ORAL_TABLET | Freq: Every day | ORAL | Status: DC
  Administered 2014-12-24 – 2014-12-25 (×2): 75 mg via ORAL
  Filled 2014-12-24 (×2): qty 1

## 2014-12-24 MED ORDER — IPRATROPIUM-ALBUTEROL 0.5-2.5 (3) MG/3ML IN SOLN
3.0000 mL | Freq: Once | RESPIRATORY_TRACT | Status: AC
  Administered 2014-12-24: 3 mL via RESPIRATORY_TRACT

## 2014-12-24 MED ORDER — IPRATROPIUM-ALBUTEROL 0.5-2.5 (3) MG/3ML IN SOLN
3.0000 mL | Freq: Once | RESPIRATORY_TRACT | Status: AC
  Administered 2014-12-24: 3 mL via RESPIRATORY_TRACT
  Filled 2014-12-24: qty 3

## 2014-12-24 MED ORDER — ONDANSETRON HCL 4 MG PO TABS: 4.0000 mg | ORAL_TABLET | Freq: Four times a day (QID) | ORAL | Status: DC | PRN

## 2014-12-24 MED ORDER — PANTOPRAZOLE SODIUM 40 MG PO TBEC
40.0000 mg | DELAYED_RELEASE_TABLET | ORAL | Status: DC
  Administered 2014-12-24 – 2014-12-25 (×2): 40 mg via ORAL
  Filled 2014-12-24 (×2): qty 1

## 2014-12-24 MED ORDER — IPRATROPIUM-ALBUTEROL 0.5-2.5 (3) MG/3ML IN SOLN
3.0000 mL | Freq: Once | RESPIRATORY_TRACT | Status: DC
  Filled 2014-12-24: qty 3

## 2014-12-24 MED ORDER — METHYLPREDNISOLONE SODIUM SUCC 125 MG IJ SOLR
125.0000 mg | Freq: Once | INTRAMUSCULAR | Status: AC
  Administered 2014-12-24: 125 mg via INTRAVENOUS
  Filled 2014-12-24: qty 2

## 2014-12-24 MED ORDER — INSULIN ASPART 100 UNIT/ML ~~LOC~~ SOLN
20.0000 [IU] | Freq: Once | SUBCUTANEOUS | Status: AC
  Administered 2014-12-24: 20 [IU] via SUBCUTANEOUS
  Filled 2014-12-24: qty 20

## 2014-12-24 MED ORDER — BUDESONIDE-FORMOTEROL FUMARATE 160-4.5 MCG/ACT IN AERO
2.0000 | INHALATION_SPRAY | Freq: Two times a day (BID) | RESPIRATORY_TRACT | Status: DC
  Administered 2014-12-24 – 2014-12-25 (×3): 2 via RESPIRATORY_TRACT
  Filled 2014-12-24: qty 6

## 2014-12-24 MED ORDER — METHYLPREDNISOLONE SODIUM SUCC 125 MG IJ SOLR
60.0000 mg | INTRAMUSCULAR | Status: DC
Start: 2014-12-24 — End: 2014-12-25
  Administered 2014-12-24 – 2014-12-25 (×2): 60 mg via INTRAVENOUS
  Filled 2014-12-24 (×2): qty 2

## 2014-12-24 MED ORDER — ASPIRIN EC 81 MG PO TBEC
81.0000 mg | DELAYED_RELEASE_TABLET | Freq: Every day | ORAL | Status: DC
  Filled 2014-12-24 (×2): qty 1

## 2014-12-24 MED ORDER — LEVOFLOXACIN 500 MG PO TABS
500.0000 mg | ORAL_TABLET | Freq: Every day | ORAL | Status: DC
  Administered 2014-12-25: 500 mg via ORAL
  Filled 2014-12-24: qty 1

## 2014-12-24 MED ORDER — LEVOFLOXACIN 500 MG PO TABS: 500.0000 mg | ORAL_TABLET | Freq: Every day | ORAL | Status: DC

## 2014-12-24 MED ORDER — LEVOFLOXACIN IN D5W 750 MG/150ML IV SOLN
750.0000 mg | Freq: Once | INTRAVENOUS | Status: AC
  Administered 2014-12-24: 750 mg via INTRAVENOUS
  Filled 2014-12-24: qty 150

## 2014-12-24 MED ORDER — FUROSEMIDE 10 MG/ML IJ SOLN
40.0000 mg | Freq: Once | INTRAMUSCULAR | Status: AC
  Administered 2014-12-24: 40 mg via INTRAVENOUS
  Filled 2014-12-24: qty 4

## 2014-12-24 MED ORDER — ACETAMINOPHEN 650 MG RE SUPP
650.0000 mg | Freq: Four times a day (QID) | RECTAL | Status: DC | PRN
Start: 2014-12-24 — End: 2014-12-25

## 2014-12-24 MED ORDER — ATORVASTATIN CALCIUM 20 MG PO TABS
40.0000 mg | ORAL_TABLET | Freq: Every day | ORAL | Status: DC
  Administered 2014-12-24 – 2014-12-25 (×2): 40 mg via ORAL
  Filled 2014-12-24 (×2): qty 2

## 2014-12-24 MED ORDER — ONDANSETRON HCL 4 MG/2ML IJ SOLN: 4.0000 mg | Freq: Four times a day (QID) | INTRAMUSCULAR | Status: DC | PRN

## 2014-12-24 MED ORDER — LINAGLIPTIN 5 MG PO TABS
5.0000 mg | ORAL_TABLET | Freq: Every day | ORAL | Status: DC
  Administered 2014-12-24 – 2014-12-25 (×2): 5 mg via ORAL
  Filled 2014-12-24 (×2): qty 1

## 2014-12-24 MED ORDER — SODIUM CHLORIDE 0.9 % IV SOLN: 250.0000 mL | INTRAVENOUS | Status: DC | PRN

## 2014-12-24 MED ORDER — IPRATROPIUM-ALBUTEROL 0.5-2.5 (3) MG/3ML IN SOLN
3.0000 mL | RESPIRATORY_TRACT | Status: DC
  Administered 2014-12-24 – 2014-12-25 (×5): 3 mL via RESPIRATORY_TRACT
  Filled 2014-12-24 (×5): qty 3

## 2014-12-24 MED ORDER — INSULIN ASPART 100 UNIT/ML ~~LOC~~ SOLN
0.0000 [IU] | Freq: Three times a day (TID) | SUBCUTANEOUS | Status: DC
  Administered 2014-12-24: 8 [IU] via SUBCUTANEOUS
  Filled 2014-12-24: qty 8

## 2014-12-24 MED ORDER — FAMOTIDINE 20 MG PO TABS
40.0000 mg | ORAL_TABLET | Freq: Every day | ORAL | Status: DC
  Administered 2014-12-24 – 2014-12-25 (×2): 40 mg via ORAL
  Filled 2014-12-24 (×2): qty 2

## 2014-12-24 MED ORDER — ENOXAPARIN SODIUM 40 MG/0.4ML ~~LOC~~ SOLN
40.0000 mg | SUBCUTANEOUS | Status: DC
  Administered 2014-12-24 – 2014-12-25 (×2): 40 mg via SUBCUTANEOUS
  Filled 2014-12-24 (×4): qty 0.4

## 2014-12-24 MED ORDER — SODIUM CHLORIDE 0.9 % IJ SOLN
3.0000 mL | INTRAMUSCULAR | Status: DC | PRN
  Administered 2014-12-24: 3 mL via INTRAVENOUS
  Filled 2014-12-24: qty 10

## 2014-12-24 MED ORDER — METFORMIN HCL 500 MG PO TABS
1000.0000 mg | ORAL_TABLET | Freq: Two times a day (BID) | ORAL | Status: DC
  Administered 2014-12-24 – 2014-12-25 (×3): 1000 mg via ORAL
  Filled 2014-12-24 (×3): qty 2

## 2014-12-24 MED ORDER — NITROGLYCERIN 0.4 MG SL SUBL: 0.4000 mg | SUBLINGUAL_TABLET | SUBLINGUAL | Status: DC | PRN

## 2014-12-24 MED ORDER — INSULIN ASPART 100 UNIT/ML ~~LOC~~ SOLN
0.0000 [IU] | Freq: Every day | SUBCUTANEOUS | Status: DC
  Administered 2014-12-24: 4 [IU] via SUBCUTANEOUS
  Filled 2014-12-24: qty 4

## 2014-12-24 MED ORDER — ENALAPRIL MALEATE 5 MG PO TABS
5.0000 mg | ORAL_TABLET | Freq: Every evening | ORAL | Status: DC
  Administered 2014-12-24: 5 mg via ORAL
  Filled 2014-12-24: qty 1

## 2014-12-24 MED ORDER — GUAIFENESIN-DM 100-10 MG/5ML PO SYRP: 5.0000 mL | ORAL_SOLUTION | ORAL | Status: DC | PRN

## 2014-12-24 MED ORDER — ACETAMINOPHEN 325 MG PO TABS: 650.0000 mg | ORAL_TABLET | Freq: Four times a day (QID) | ORAL | Status: DC | PRN

## 2014-12-24 MED ORDER — DOCUSATE SODIUM 100 MG PO CAPS
100.0000 mg | ORAL_CAPSULE | Freq: Two times a day (BID) | ORAL | Status: DC
  Administered 2014-12-24 – 2014-12-25 (×3): 100 mg via ORAL
  Filled 2014-12-24 (×3): qty 1

## 2014-12-24 MED ORDER — INSULIN ASPART 100 UNIT/ML ~~LOC~~ SOLN
0.0000 [IU] | Freq: Three times a day (TID) | SUBCUTANEOUS | Status: DC
  Administered 2014-12-24: 7 [IU] via SUBCUTANEOUS
  Administered 2014-12-25: 15 [IU] via SUBCUTANEOUS
  Filled 2014-12-24: qty 15
  Filled 2014-12-24: qty 7

## 2014-12-24 MED ORDER — ASPIRIN EC 81 MG PO TBEC
81.0000 mg | DELAYED_RELEASE_TABLET | Freq: Every day | ORAL | Status: DC
  Administered 2014-12-24 – 2014-12-25 (×2): 81 mg via ORAL

## 2014-12-24 MED ORDER — SODIUM CHLORIDE 0.9 % IJ SOLN
3.0000 mL | Freq: Two times a day (BID) | INTRAMUSCULAR | Status: DC
  Administered 2014-12-24 (×2): 3 mL via INTRAVENOUS

## 2014-12-24 MED ORDER — METOPROLOL SUCCINATE ER 50 MG PO TB24
50.0000 mg | ORAL_TABLET | Freq: Every day | ORAL | Status: DC
  Administered 2014-12-24 – 2014-12-25 (×2): 50 mg via ORAL
  Filled 2014-12-24 (×2): qty 1

## 2014-12-24 NOTE — ED Notes (Addendum)
Patient reports mid sternal non-radiating chest pain that started Friday.  Patient reports productive cough (green sputum).  Patient reports pain increases with coughing.

## 2014-12-24 NOTE — ED Notes (Signed)
MD at bedside. 

## 2014-12-24 NOTE — ED Notes (Signed)
Ambulated pt to end of the hallway and back to room per MD; sats 89% on room air upon return to room; Dr Edd Fabian aware

## 2014-12-24 NOTE — H&P (Signed)
[facility] NAME: John Frank    MR#:  [id number]  DATE OF BIRTH:  1955/08/28  DATE OF ADMISSION:  12/24/2014  PRIMARY CARE PHYSICIAN: Kathrine Haddock, NP   REQUESTING/REFERRING PHYSICIAN: Dr. Caprice Renshaw  CHIEF COMPLAINT:   Chief Complaint  Patient presents with  . Chest Pain  . Cough    HISTORY OF PRESENT ILLNESS:  John Frank  is a 59 y.o. male with a known history of COPD, CAD, tobacco abuse here with 1 week of cough, wheezing, SOB. Green sputum. No orthopnea/edema. Sats 87% on RA in ED. Not on home O2. Some chest pain only on coughing. No sick contacts or recent travel. On nebuliser and inhalers at home. Continues to smoke  PAST MEDICAL HISTORY:   Past Medical History  Diagnosis Date  . Diabetes   . Heart attack   . Shortness of breath dyspnea   . Hyperlipidemia   . Hypertension   . GERD (gastroesophageal reflux disease)   . Presence of permanent cardiac pacemaker   . Sleep apnea   . COPD (chronic obstructive pulmonary disease)     PAST SURGICAL HISTORY:   Past Surgical History  Procedure Laterality Date  . Cardiac surgery    . Esophagogastroduodenoscopy N/A 09/19/2014    Procedure: ESOPHAGOGASTRODUODENOSCOPY (EGD);  Surgeon: Lucilla Lame, MD;  Location: [facility] ENDOSCOPY;  Service: Endoscopy;  Laterality: N/A;    SOCIAL HISTORY:   Social History  Substance Use Topics  . Smoking status: Current Every Day Smoker -- 0.50 packs/day for 40 years    Types: Cigarettes  . Smokeless tobacco: Former Systems developer  . Alcohol Use: No    FAMILY HISTORY:   Family History  Problem Relation Age of Onset  . Diabetes Mother   . Heart attack Father 25    DRUG ALLERGIES:  No Known Allergies  REVIEW OF SYSTEMS:   Review of Systems  Constitutional: Positive for malaise/fatigue. Negative for fever, chills and weight loss.  HENT: Negative for hearing loss and nosebleeds.   Eyes: Negative for blurred vision, double  vision and pain.  Respiratory: Positive for cough, sputum production, shortness of breath and wheezing. Negative for hemoptysis.   Cardiovascular: Negative for chest pain, palpitations, orthopnea and leg swelling.  Gastrointestinal: Negative for nausea, vomiting, abdominal pain, diarrhea and constipation.  Genitourinary: Negative for dysuria and hematuria.  Musculoskeletal: Positive for back pain. Negative for myalgias and falls.  Skin: Negative for rash.  Neurological: Positive for weakness. Negative for dizziness, tremors, sensory change, speech change, focal weakness, seizures and headaches.  Endo/Heme/Allergies: Does not bruise/bleed easily.  Psychiatric/Behavioral: Negative for depression and memory loss. The patient is not nervous/anxious.     MEDICATIONS AT HOME:   Prior to Admission medications   Medication Sig Start Date End Date Taking? Authorizing Provider  aspirin EC 81 MG tablet Take 81 mg by mouth daily.   Yes Historical Provider, MD  atorvastatin (LIPITOR) 40 MG tablet Take 40 mg by mouth daily.   Yes Historical Provider, MD  budesonide-formoterol (SYMBICORT) 160-4.5 MCG/ACT inhaler Inhale 2 puffs into the lungs 2 (two) times daily.   Yes Historical Provider, MD  clopidogrel (PLAVIX) 75 MG tablet Take 75 mg by mouth daily.   Yes Historical Provider, MD  enalapril (VASOTEC) 5 MG tablet Take 5 mg by mouth every evening.   Yes Historical Provider, MD  famotidine (PEPCID) 40 MG tablet Take 40 mg by mouth daily.   Yes Historical Provider, MD  ipratropium-albuterol (DUONEB)  0.5-2.5 (3) MG/3ML SOLN Take 3 mLs by nebulization every 6 (six) hours as needed (wheezing; shortness of breath).   Yes Historical Provider, MD  metFORMIN (GLUCOPHAGE) 500 MG tablet Take by mouth 2 (two) times daily with a meal.   Yes Historical Provider, MD  metoprolol succinate (TOPROL-XL) 50 MG 24 hr tablet Take 50 mg by mouth daily. Take with or immediately following a meal.   Yes Historical Provider, MD   nitroGLYCERIN (NITROSTAT) 0.4 MG SL tablet Place 0.4 mg under the tongue every 5 (five) minutes as needed for chest pain.   Yes Historical Provider, MD  pantoprazole (PROTONIX) 40 MG tablet Take 40 mg by mouth every morning.   Yes Historical Provider, MD  sitaGLIPtin (JANUVIA) 100 MG tablet Take 1 tablet (100 mg total) by mouth daily. 11/03/14  Yes Kathrine Haddock, NP      VITAL SIGNS:  Blood pressure 136/72, pulse 90, temperature 97.5 F (36.4 C), temperature source Oral, resp. rate 20, height '5\' 8"'$  (1.727 m), weight 62.143 kg (137 lb), SpO2 89 %.  PHYSICAL EXAMINATION:  Physical Exam  GENERAL:  59 y.o.-year-old patient lying in the bed with no acute distress.  EYES: Pupils equal, round, reactive to light and accommodation. No scleral icterus. Extraocular muscles intact.  HEENT: Head atraumatic, normocephalic. Oropharynx and nasopharynx clear. No oropharyngeal erythema, moist oral mucosa  NECK:  Supple, no jugular venous distention. No thyroid enlargement, no tenderness.  LUNGS: Bilateral wheezing CARDIOVASCULAR: S1, S2 normal. No murmurs, rubs, or gallops.  ABDOMEN: Soft, nontender, nondistended. Bowel sounds present. No organomegaly or mass.  EXTREMITIES: No pedal edema, cyanosis, or clubbing. + 2 pedal & radial pulses b/l.   NEUROLOGIC: Cranial nerves II through XII are intact. No focal Motor or sensory deficits appreciated b/l PSYCHIATRIC: The patient is alert and oriented x 3. Good affect.  SKIN: No obvious rash, lesion, or ulcer.   LABORATORY PANEL:   CBC  Recent Labs Lab 12/24/14 0443  WBC 10.8*  HGB 13.7  HCT 41.7  PLT 230   ------------------------------------------------------------------------------------------------------------------  Chemistries   Recent Labs Lab 12/24/14 0345  NA 137  K 4.5  CL 101  CO2 24  GLUCOSE 188*  BUN 12  CREATININE 0.94  CALCIUM 9.0    ------------------------------------------------------------------------------------------------------------------  Cardiac Enzymes  Recent Labs Lab 12/24/14 0345  TROPONINI <0.03   ------------------------------------------------------------------------------------------------------------------  RADIOLOGY:  Dg Chest 2 View  12/24/2014   CLINICAL DATA:  Acute onset of mid sternal chest pain and productive cough. Initial encounter.  EXAM: CHEST  2 VIEW  COMPARISON:  Chest radiograph performed 05/09/2014  FINDINGS: The lungs are well-aerated. Vascular congestion is noted. Peribronchial thickening is seen. There is no evidence of pleural effusion or pneumothorax.  The heart is normal in size; the mediastinal contour is within normal limits. A pacemaker/AICD is noted at the left chest wall, with leads ending at the right atrium and right ventricle. No acute osseous abnormalities are seen.  IMPRESSION: Vascular congestion noted.  Peribronchial thickening seen.   Electronically Signed   By: Garald Balding M.D.   On: 12/24/2014 04:34     IMPRESSION AND PLAN:   * COPD exacerbation -IV steroids, Antibiotics - Scheduled Nebulizers - Inhalers -Wean O2 as tolerated - Consult pulmonary if no improvement  * DM2 ON IV steroids SSI, home meds  * Chronic systolic chf Stable No edema, JVD  * HTN Continue home medications  *Tobacco abuse Counseled to quit > 50mnutes  * DVT prophylaxis Lovenox    All the  records are reviewed and case discussed with ED provider. Management plans discussed with the patient, family and they are in agreement.  CODE STATUS: FULL CODE  TOTAL TIME TAKING CARE OF THIS PATIENT: 40 minutes.    Hillary Bow R M.D on 12/24/2014 at 8:36 AM  Between 7am to 6pm - Pager - [phone]  After 6pm go to [email or web address] - password [email or web address] [facility]  Office  [phone]  CC: Primary care physician; Kathrine Haddock, NP

## 2014-12-24 NOTE — ED Notes (Signed)
Notified by lab that lavender top had hemolyzed

## 2014-12-24 NOTE — ED Provider Notes (Signed)
[facility] Emergency Department Provider Note  ____________________________________________  Time seen: Approximately 4:39 AM  I have reviewed the triage vital signs and the nursing notes.   HISTORY  Chief Complaint Chest Pain and Cough    HPI [name] is a 59 y.o. male with history of coronary artery disease with stents, CHF with EF of 35-40%, COPD, GERD who presents for evaluation of 6 days of URI symptoms. Patient reports that he has had cough productive of brownish green sputum. 2 days ago he developed chest pain but only with cough. He has mild shortness of breath which is not increased from baseline. No vomiting or diarrhea. No fevers or chills. Current severity of symptoms is mild to moderate. No modifying factors.   Past Medical History  Diagnosis Date  . Diabetes   . Heart attack   . Shortness of breath dyspnea   . Hyperlipidemia   . Hypertension   . GERD (gastroesophageal reflux disease)   . Presence of permanent cardiac pacemaker   . Sleep apnea   . COPD (chronic obstructive pulmonary disease)     Patient Active Problem List   Diagnosis Date Noted  . Hypertension, accelerated with heart disease, without CHF 11/03/2014  . Sleep apnea 10/26/2014  . CAD (coronary artery disease) 10/26/2014  . GERD (gastroesophageal reflux disease) 10/26/2014  . Polyp of colon 10/26/2014  . Diabetes mellitus without complication 84/16/6063  . COPD (chronic obstructive pulmonary disease) 10/26/2014  . Hyperlipidemia 10/26/2014  . COPD, severe 10/26/2014  . Status cardiac pacemaker 10/26/2014  . Lesion of nasal septum 10/26/2014    Past Surgical History  Procedure Laterality Date  . Cardiac surgery    . Esophagogastroduodenoscopy N/A 09/19/2014    Procedure: ESOPHAGOGASTRODUODENOSCOPY (EGD);  Surgeon: Lucilla Lame, MD;  Location: [facility] ENDOSCOPY;  Service: Endoscopy;  Laterality: N/A;    Current Outpatient Rx  Name  Route  Sig  Dispense  Refill  .  aspirin EC 81 MG tablet   Oral   Take 81 mg by mouth daily.         Marland Kitchen atorvastatin (LIPITOR) 40 MG tablet   Oral   Take 40 mg by mouth daily.         . budesonide-formoterol (SYMBICORT) 160-4.5 MCG/ACT inhaler   Inhalation   Inhale 2 puffs into the lungs 2 (two) times daily.         . clopidogrel (PLAVIX) 75 MG tablet   Oral   Take 75 mg by mouth daily.         . enalapril (VASOTEC) 5 MG tablet   Oral   Take 5 mg by mouth every evening.         . famotidine (PEPCID) 40 MG tablet   Oral   Take 40 mg by mouth daily.         Marland Kitchen ipratropium-albuterol (DUONEB) 0.5-2.5 (3) MG/3ML SOLN   Nebulization   Take 3 mLs by nebulization every 6 (six) hours as needed (wheezing; shortness of breath).         . metFORMIN (GLUCOPHAGE) 500 MG tablet   Oral   Take by mouth 2 (two) times daily with a meal.         . metoprolol succinate (TOPROL-XL) 50 MG 24 hr tablet   Oral   Take 50 mg by mouth daily. Take with or immediately following a meal.         . nitroGLYCERIN (NITROSTAT) 0.4 MG SL tablet   Sublingual   Place 0.4  mg under the tongue every 5 (five) minutes as needed for chest pain.         . pantoprazole (PROTONIX) 40 MG tablet   Oral   Take 40 mg by mouth every morning.         . sitaGLIPtin (JANUVIA) 100 MG tablet   Oral   Take 1 tablet (100 mg total) by mouth daily.   90 tablet   1     Allergies Review of patient's allergies indicates no known allergies.  Family History  Problem Relation Age of Onset  . Diabetes Mother   . Heart attack Father 15    Social History Social History  Substance Use Topics  . Smoking status: Current Every Day Smoker -- 0.50 packs/day for 40 years    Types: Cigarettes  . Smokeless tobacco: Former Systems developer  . Alcohol Use: No    Review of Systems Constitutional: No fever/chills Eyes: No visual changes. ENT: No sore throat. Cardiovascular: Denies chest pain. Respiratory: Denies shortness of  breath. Gastrointestinal: No abdominal pain.  No nausea, no vomiting.  No diarrhea.  No constipation. Genitourinary: Negative for dysuria. Musculoskeletal: Negative for back pain. Skin: Negative for rash. Neurological: Negative for headaches, focal weakness or numbness.  10-point ROS otherwise negative.  ____________________________________________   PHYSICAL EXAM:  VITAL SIGNS: ED Triage Vitals  Enc Vitals Group     BP 12/24/14 0319 132/68 mmHg     Pulse Rate 12/24/14 0319 85     Resp 12/24/14 0319 20     Temp 12/24/14 0319 97.5 F (36.4 C)     Temp Source 12/24/14 0319 Oral     SpO2 12/24/14 0319 93 %     Weight 12/24/14 0319 137 lb (62.143 kg)     Height 12/24/14 0319 '5\' 8"'$  (1.727 m)     Head Cir --      Peak Flow --      Pain Score 12/24/14 0320 8     Pain Loc --      Pain Edu? --      Excl. in [place]? --     Constitutional: Alert and oriented. Chronically ill-appearing nontoxic, mildly tachypnea. Eyes: Conjunctivae are normal. PERRL. EOMI. Head: Atraumatic. Nose: No congestion/rhinnorhea. Mouth/Throat: Mucous membranes are moist.  Oropharynx non-erythematous. Neck: No stridor.   Cardiovascular: Normal rate, regular rhythm. Grossly normal heart sounds.  Good peripheral circulation. Respiratory: Mild tachypnea with mildly increased work of breathing. Diffuse mild expiratory wheeze with poor air movement. Gastrointestinal: Soft and nontender. No distention. No abdominal bruits. No CVA tenderness. Genitourinary: Deferred Musculoskeletal: No lower extremity tenderness nor edema.  No joint effusions. Neurologic:  Normal speech and language. No gross focal neurologic deficits are appreciated. No gait instability. Skin:  Skin is warm, dry and intact. No rash noted. Psychiatric: Mood and affect are normal. Speech and behavior are normal.  ____________________________________________   LABS (all labs ordered are listed, but only abnormal results are displayed)  Labs  Reviewed  BASIC METABOLIC PANEL - Abnormal; Notable for the following:    Glucose, Bld 188 (*)    All other components within normal limits  CBC WITH DIFFERENTIAL/PLATELET - Abnormal; Notable for the following:    WBC 10.8 (*)    RDW 16.5 (*)    Neutro Abs 7.6 (*)    Monocytes Absolute 1.1 (*)    All other components within normal limits  CULTURE, BLOOD (ROUTINE X 2)  CULTURE, BLOOD (ROUTINE X 2)  TROPONIN I   ____________________________________________  EKG  ED ECG REPORT I, Joanne Gavel, the attending physician, personally viewed and interpreted this ECG.   Date: 12/24/2014  EKG Time: 03:15  Rate: 85  Rhythm: normal sinus rhythm  Axis: normal  Intervals:none  ST&T Change: No acute ST segment elevation. Q waves and T-wave inversions in lead 3 and aVF. EKG unchanged from 05/09/2014.  ____________________________________________  RADIOLOGY  CXR FINDINGS: The lungs are well-aerated. Vascular congestion is noted. Peribronchial thickening is seen. There is no evidence of pleural effusion or pneumothorax.  The heart is normal in size; the mediastinal contour is within normal limits. A pacemaker/AICD is noted at the left chest wall, with leads ending at the right atrium and right ventricle. No acute osseous abnormalities are seen.  IMPRESSION: Vascular congestion noted. Peribronchial thickening seen.   ____________________________________________   PROCEDURES  Procedure(s) performed: None  Critical Care performed: Yes, see critical care note(s). Total critical care time spent 30 minutes.  ____________________________________________   INITIAL IMPRESSION / ASSESSMENT AND PLAN / ED COURSE  Pertinent labs & imaging results that were available during my care of the patient were reviewed by me and considered in my medical decision making (see chart for details).  John Frank is a 59 y.o. male with history of coronary artery disease with stents, CHF with EF  of 35-40%, COPD, GERD who presents for evaluation of 6 days of URI symptoms, productive cough, shortness of breath, chest pain with cough. On exam, he is chronically ill-appearing. He has mild tachypnea with mildly increased work of breathing and wheezing concerning for possible COPD exacerbation which is likely triggered by viral illness. EKG is unchanged from prior, troponin negative, stress test in 06/23/2014 showed no clear evidence of ischemia, making ACS less likely. Doubt PE or acute aortic dissection. We'll give  duonebs, steroids. Reassess for disposition.   ----------------------------------------- 7:03 AM on 12/24/2014 -----------------------------------------  Patient still with poor air movement on exam after 3 DuoNebs and 125 mg of Solu-Medrol. He remains mildly tachypneic. With ambulation he desaturates to 89%. On my repeat examination, he desaturated to 87%. He has no chronic home oxygen requirement. We'll give Levaquin. Will discuss with hospitalist for admission. ____________________________________________   FINAL CLINICAL IMPRESSION(S) / ED DIAGNOSES  Final diagnoses:  SOB (shortness of breath)  COPD with acute exacerbation      Joanne Gavel, MD 12/24/14 [phone]

## 2014-12-24 NOTE — Progress Notes (Signed)
Pts. fsbs is 421. Dr. Tamsen Meek notified and ordered 20 units of novolog one time order.

## 2014-12-25 LAB — GLUCOSE, CAPILLARY: Glucose-Capillary: 275 mg/dL — ABNORMAL HIGH (ref 65–99)

## 2014-12-25 MED ORDER — METFORMIN HCL 1000 MG PO TABS: 1000.0000 mg | ORAL_TABLET | Freq: Two times a day (BID) | ORAL | Status: DC

## 2014-12-25 MED ORDER — TIOTROPIUM BROMIDE MONOHYDRATE 18 MCG IN CAPS: 18.0000 ug | ORAL_CAPSULE | Freq: Every day | RESPIRATORY_TRACT | Status: DC

## 2014-12-25 MED ORDER — LEVOFLOXACIN 500 MG PO TABS: 500.0000 mg | ORAL_TABLET | Freq: Every day | ORAL | Status: DC

## 2014-12-25 MED ORDER — PREDNISONE 20 MG PO TABS: 40.0000 mg | ORAL_TABLET | Freq: Every day | ORAL | Status: DC

## 2014-12-25 NOTE — Care Management Note (Signed)
Case Management Note  Patient Details  Name: John Frank MRN: [id number] Date of Birth: 1955/10/06  Subjective/Objective:                  Met with patient and two of his friends after patient gave permission for CM assessment. Patient states that he lives alone and has a small dog there with him. He states he is NOT on home O2. He states he is independent with mobility and able to drive. His PCP is with [facility] His [facility]. He states he can afford his Rx. No RNCM needs.  Action/Plan:   RNCM will continue to follow for home O2. RN to assess for home O2 need. Patient wants go home today.  Expected Discharge Date:  12/28/14               Expected Discharge Plan:     In-House Referral:     Discharge planning Services  CM Consult  Post Acute Care Choice:  Durable Medical Equipment Choice offered to:  Patient  DME Arranged:    DME Agency:     HH Arranged:    [place] Agency:     Status of Service:  In process, will continue to follow  Medicare Important Message Given:    Date Medicare IM Given:    Medicare IM give by:    Date Additional Medicare IM Given:    Additional Medicare Important Message give by:     If discussed at [place] of Stay Meetings, dates discussed:    Additional Comments:  Marshell Garfinkel, RN 12/25/2014, 9:33 AM

## 2014-12-25 NOTE — Progress Notes (Signed)
Completed discharge instructions with patient and spouse present.  Patient eager to discharge.  No complaints at discharge.

## 2014-12-25 NOTE — Discharge Instructions (Signed)
°  DIET:  Cardiac diet  DISCHARGE CONDITION:  Stable  ACTIVITY:  Activity as tolerated  OXYGEN:  Home Oxygen: No.   Oxygen Delivery: room air  DISCHARGE LOCATION:  home   If you experience worsening of your admission symptoms, develop shortness of breath, life threatening emergency, suicidal or homicidal thoughts you must seek medical attention immediately by calling 911 or calling your MD immediately  if symptoms less severe.  You Must read complete instructions/literature along with all the possible adverse reactions/side effects for all the Medicines you take and that have been prescribed to you. Take any new Medicines after you have completely understood and accpet all the possible adverse reactions/side effects.   Please note  You were cared for by a hospitalist during your hospital stay. If you have any questions about your discharge medications or the care you received while you were in the hospital after you are discharged, you can call the unit and asked to speak with the hospitalist on call if the hospitalist that took care of you is not available. Once you are discharged, your primary care physician will handle any further medical issues. Please note that NO REFILLS for any discharge medications will be authorized once you are discharged, as it is imperative that you return to your primary care physician (or establish a relationship with a primary care physician if you do not have one) for your aftercare needs so that they can reassess your need for medications and monitor your lab values.   QUIT SMOKING

## 2014-12-26 ENCOUNTER — Ambulatory Visit: Payer: Commercial Managed Care - HMO | Admitting: Unknown Physician Specialty

## 2014-12-26 NOTE — Discharge Summary (Signed)
[place] [facility] [place] NAME: John Frank    MR#:  [id number]  DATE OF BIRTH:  1955/11/23  DATE OF ADMISSION:  12/24/2014 ADMITTING PHYSICIAN: Hillary Bow, MD  DATE OF DISCHARGE: 12/25/2014 10:59 AM  PRIMARY CARE PHYSICIAN: Kathrine Haddock, NP    ADMISSION DIAGNOSIS:  SOB (shortness of breath) [R06.02] COPD with acute exacerbation [J44.1]  DISCHARGE DIAGNOSIS:  Active Problems:   COPD exacerbation   Acute respiratory failure with hypoxia   SECONDARY DIAGNOSIS:   Past Medical History  Diagnosis Date  . Diabetes   . Heart attack   . Shortness of breath dyspnea   . Hyperlipidemia   . Hypertension   . GERD (gastroesophageal reflux disease)   . Presence of permanent cardiac pacemaker   . Sleep apnea   . COPD (chronic obstructive pulmonary disease)      ADMITTING HISTORY  John Frank is a 59 y.o. male with a known history of COPD, CAD, tobacco abuse here with 1 week of cough, wheezing, SOB. Green sputum. No orthopnea/edema. Sats 87% on RA in ED. Not on home O2. Some chest pain only on coughing. No sick contacts or recent travel. On nebuliser and inhalers at home. Continues to smoke   HOSPITAL COURSE:   * COPD exacerbation -IV steroids, Antibiotics - Scheduled Nebulizers - Inhalers -Wean O2 as tolerated  By the day of discharge patient has ambulated in the hallway without any shortness of breath and not needing oxygen. His breathing is back to baseline without any wheezing on examination. Discharge home on oral prednisone and antibiotics along with his home nebulizers.  * DM2 ON IV steroids and had uncontrolled blood sugars which are improved. SSI, home meds  * Chronic systolic chf Stable No edema, JVD  * HTN Continue home medications  *Tobacco abuse Counseled to quit > 31mnutes  * DVT prophylaxis Lovenox in the hospital.  Stable for discharge.  CONSULTS OBTAINED:     DRUG ALLERGIES:  No Known  Allergies  DISCHARGE MEDICATIONS:   Discharge Medication List as of 12/25/2014 10:10 AM    START taking these medications   Details  levofloxacin (LEVAQUIN) 500 MG tablet Take 1 tablet (500 mg total) by mouth daily., Starting 12/25/2014, Until Discontinued, Normal    !! metFORMIN (GLUCOPHAGE) 1000 MG tablet Take 1 tablet (1,000 mg total) by mouth 2 (two) times daily with a meal., Starting 12/25/2014, Until Discontinued, Normal    predniSONE (DELTASONE) 20 MG tablet Take 2 tablets (40 mg total) by mouth daily with breakfast., Starting 12/25/2014, Until Discontinued, Normal    tiotropium (SPIRIVA HANDIHALER) 18 MCG inhalation capsule Place 1 capsule (18 mcg total) into inhaler and inhale daily., Starting 12/25/2014, Until Discontinued, Normal     !! - Potential duplicate medications found. Please discuss with provider.    CONTINUE these medications which have NOT CHANGED   Details  aspirin EC 81 MG tablet Take 81 mg by mouth daily., Until Discontinued, Historical Med    atorvastatin (LIPITOR) 40 MG tablet Take 40 mg by mouth daily., Until Discontinued, Historical Med    budesonide-formoterol (SYMBICORT) 160-4.5 MCG/ACT inhaler Inhale 2 puffs into the lungs 2 (two) times daily., Until Discontinued, Historical Med    clopidogrel (PLAVIX) 75 MG tablet Take 75 mg by mouth daily., Until Discontinued, Historical Med    enalapril (VASOTEC) 5 MG tablet Take 5 mg by mouth every evening., Until Discontinued, Historical Med    famotidine (PEPCID) 40 MG tablet Take 40 mg by  mouth daily., Until Discontinued, Historical Med    ipratropium-albuterol (DUONEB) 0.5-2.5 (3) MG/3ML SOLN Take 3 mLs by nebulization every 6 (six) hours as needed (wheezing; shortness of breath)., Until Discontinued, Historical Med    !! metFORMIN (GLUCOPHAGE) 500 MG tablet Take by mouth 2 (two) times daily with a meal., Until Discontinued, Historical Med    metoprolol succinate (TOPROL-XL) 50 MG 24 hr tablet Take 50 mg by mouth  daily. Take with or immediately following a meal., Until Discontinued, Historical Med    nitroGLYCERIN (NITROSTAT) 0.4 MG SL tablet Place 0.4 mg under the tongue every 5 (five) minutes as needed for chest pain., Until Discontinued, Historical Med    pantoprazole (PROTONIX) 40 MG tablet Take 40 mg by mouth every morning., Until Discontinued, Historical Med    sitaGLIPtin (JANUVIA) 100 MG tablet Take 1 tablet (100 mg total) by mouth daily., Starting 11/03/2014, Until Discontinued, Normal     !! - Potential duplicate medications found. Please discuss with provider.       Today    VITAL SIGNS:  Blood pressure 124/73, pulse 80, temperature 97.5 F (36.4 C), temperature source Oral, resp. rate 18, height '5\' 8"'$  (1.727 m), weight 75.24 kg (165 lb 14 oz), SpO2 96 %.  I/O:  No intake or output data in the 24 hours ending 12/26/14 1419  PHYSICAL EXAMINATION:  Physical Exam  GENERAL:  59 y.o.-year-old patient lying in the bed with no acute distress.  LUNGS: Normal breath sounds bilaterally, no wheezing, rales,rhonchi or crepitation. No use of accessory muscles of respiration.  CARDIOVASCULAR: S1, S2 normal. No murmurs, rubs, or gallops.  ABDOMEN: Soft, non-tender, non-distended. Bowel sounds present. No organomegaly or mass.  NEUROLOGIC: Moves all 4 extremities. PSYCHIATRIC: The patient is alert and oriented x 3.  SKIN: No obvious rash, lesion, or ulcer.   DATA REVIEW:   CBC  Recent Labs Lab 12/24/14 0443  WBC 10.8*  HGB 13.7  HCT 41.7  PLT 230    Chemistries   Recent Labs Lab 12/24/14 0345  NA 137  K 4.5  CL 101  CO2 24  GLUCOSE 188*  BUN 12  CREATININE 0.94  CALCIUM 9.0    Cardiac Enzymes  Recent Labs Lab 12/24/14 0345  TROPONINI <0.03    Microbiology Results  Results for orders placed or performed during the hospital encounter of 12/24/14  Blood culture (routine x 2)     Status: None (Preliminary result)   Collection Time: 12/24/14  7:18 AM  Result  Value Ref Range Status   Specimen Description BLOOD LEFT ARM  Final   Special Requests BOTTLES DRAWN AEROBIC AND ANAEROBIC  6CC  Final   Culture NO GROWTH 2 DAYS  Final   Report Status PENDING  Incomplete  Blood culture (routine x 2)     Status: None (Preliminary result)   Collection Time: 12/24/14  7:25 AM  Result Value Ref Range Status   Specimen Description BLOOD RIGHT ASSIST CONTROL  Final   Special Requests   Final    BOTTLES DRAWN AEROBIC AND ANAEROBIC  AER 2CC ANA 1CC   Culture NO GROWTH 2 DAYS  Final   Report Status PENDING  Incomplete    RADIOLOGY:  No results found.    Follow up with PCP in 1 week.  Management plans discussed with the patient, family and they are in agreement.  CODE STATUS:   TOTAL TIME TAKING CARE OF THIS PATIENT ON DAY OF DISCHARGE: more than 30 minutes.    Maurisa Tesmer, Artist  R M.D on 12/26/2014 at 2:19 PM  Between 7am to 6pm - Pager - [phone]  After 6pm go to [email or web address] - password [email or web address] [facility]  Office  [phone]  CC: Primary care physician; Kathrine Haddock, NP

## 2014-12-29 ENCOUNTER — Ambulatory Visit: Payer: Commercial Managed Care - HMO | Admitting: Unknown Physician Specialty

## 2014-12-29 LAB — CULTURE, BLOOD (ROUTINE X 2)
Culture: NO GROWTH
Culture: NO GROWTH

## 2015-01-08 DIAGNOSIS — J449 Chronic obstructive pulmonary disease, unspecified: Secondary | ICD-10-CM | POA: Diagnosis not present

## 2015-01-08 DIAGNOSIS — I429 Cardiomyopathy, unspecified: Secondary | ICD-10-CM | POA: Diagnosis not present

## 2015-01-08 DIAGNOSIS — I2510 Atherosclerotic heart disease of native coronary artery without angina pectoris: Secondary | ICD-10-CM | POA: Diagnosis not present

## 2015-01-08 DIAGNOSIS — E119 Type 2 diabetes mellitus without complications: Secondary | ICD-10-CM | POA: Diagnosis not present

## 2015-02-05 ENCOUNTER — Ambulatory Visit: Payer: Commercial Managed Care - HMO | Admitting: Unknown Physician Specialty

## 2015-02-12 ENCOUNTER — Other Ambulatory Visit: Payer: Self-pay

## 2015-02-12 MED ORDER — ATORVASTATIN CALCIUM 40 MG PO TABS: 40.0000 mg | ORAL_TABLET | Freq: Every day | ORAL | Status: DC

## 2015-02-12 NOTE — Telephone Encounter (Signed)
Patient was last seen 11/03/14 and [facility] [place].

## 2015-02-13 ENCOUNTER — Other Ambulatory Visit: Payer: Self-pay

## 2015-02-13 ENCOUNTER — Telehealth: Payer: Self-pay | Admitting: Unknown Physician Specialty

## 2015-02-13 DIAGNOSIS — E1169 Type 2 diabetes mellitus with other specified complication: Secondary | ICD-10-CM

## 2015-02-13 MED ORDER — METFORMIN HCL 1000 MG PO TABS: 1000.0000 mg | ORAL_TABLET | Freq: Two times a day (BID) | ORAL | Status: DC

## 2015-02-13 NOTE — Telephone Encounter (Signed)
[facility] called stated he sent a refill request for Metformin, pt stated he has been taking '1000mg'$  instead of '500mg'$ , this is his new dose. Ro wanted to make sure we were aware. Last Rx for Metformin written by Dr. Darvin Neighbours. Thanks.

## 2015-02-13 NOTE — Telephone Encounter (Signed)
Pt. Requests refill on Metformin HCL 1000 mg tablets.

## 2015-02-13 NOTE — Telephone Encounter (Signed)
Pt needs to be seen.  No more prescriptions till then

## 2015-02-13 NOTE — Telephone Encounter (Signed)
Routing to provider. Dr. Darvin Neighbours changed this patient's dose and was wanting to let us know.

## 2015-02-14 NOTE — Telephone Encounter (Signed)
Called and scheduled patient an appointment for 02/19/15.

## 2015-02-19 ENCOUNTER — Encounter: Payer: Self-pay | Admitting: Unknown Physician Specialty

## 2015-02-19 ENCOUNTER — Ambulatory Visit (INDEPENDENT_AMBULATORY_CARE_PROVIDER_SITE_OTHER): Payer: Commercial Managed Care - HMO | Admitting: Unknown Physician Specialty

## 2015-02-19 VITALS — BP 126/78 | HR 75 | Temp 97.5°F | Ht 62.2 in | Wt 166.6 lb

## 2015-02-19 DIAGNOSIS — E119 Type 2 diabetes mellitus without complications: Secondary | ICD-10-CM

## 2015-02-19 DIAGNOSIS — E785 Hyperlipidemia, unspecified: Secondary | ICD-10-CM | POA: Diagnosis not present

## 2015-02-19 DIAGNOSIS — I119 Hypertensive heart disease without heart failure: Secondary | ICD-10-CM | POA: Diagnosis not present

## 2015-02-19 DIAGNOSIS — J449 Chronic obstructive pulmonary disease, unspecified: Secondary | ICD-10-CM | POA: Diagnosis not present

## 2015-02-19 DIAGNOSIS — Z23 Encounter for immunization: Secondary | ICD-10-CM

## 2015-02-19 MED ORDER — METFORMIN HCL 1000 MG PO TABS: 1000.0000 mg | ORAL_TABLET | Freq: Two times a day (BID) | ORAL | Status: DC

## 2015-02-19 NOTE — Assessment & Plan Note (Addendum)
Hgb A1C reviewed results 8.6 while hospitalized pt was taking Prednisone resulting in elevated blood sugars ranging between 300 to 400's.  He is currently taking his blood sugars at home his fasting blood sugars have decreased readings have been 120-130's  Instructed pt to continue current medications will recheck HgB A1C in 3 months

## 2015-02-19 NOTE — Progress Notes (Signed)
BP 126/78 mmHg  Pulse 75  Temp(Src) 97.5 F (36.4 C)  Ht 5' 2.2" (1.58 m)  Wt 166 lb 9.6 oz (75.569 kg)  BMI 30.27 kg/m2  SpO2 95%   Subjective:    Patient ID: John Frank, male    DOB: [birth year], 59 y.o.   MRN: [id number]  HPI: John Frank is a 59 y.o. male  Chief Complaint  Patient presents with  . Diabetes  . Hyperlipidemia  . Hypertension   The pt presents to office today he was hospitalized on Aug. 15, 2016 secondary to a COPD exacerbation he states he feels much better.  Pertinent negatives denies chest pain, shortness of breath, edema, or palpitations  Diabetes This is a chronic problem medication compliance is excellent.  He is satisfied with the  current treatment.  He checks his blood sugar at home ranging between 120-130's.  Pertinent negatives denies polyphagia, polydipsia, hypoglycemic events, nonhealing wounds.  Hypertension/Hyperlipidemia This is a chronic problem medication compliance is excellent.  He is satisfied with the current treatment.  He does not check his blood pressure at home.  Pertinent negatives denies headaches, visual changes, shortness of breath, edema, chest pain, or palpitations  COPD This is a chronic problem he takes Duonebs 4 times a day, Symbicort twice a day, and Spiriva once a day he has not seen Dr. Raul Del in 1 year.  Has intermittent dry cough.  Pertinent negatives denies shortness of breath, wheezing, chest pain or shortness of breath  Relevant past medical, surgical, family and social history reviewed and updated as indicated. Interim medical history since our last visit reviewed. Allergies and medications reviewed and updated.  Review of Systems  Constitutional: Negative.   HENT: Negative.   Eyes: Negative.   Respiratory: Positive for cough. Negative for apnea, choking, chest tightness, shortness of breath, wheezing and stridor.   Cardiovascular: Negative.   Gastrointestinal: Negative.   Endocrine: Positive for  polydipsia. Negative for polyphagia and polyuria.  Genitourinary: Negative.   Musculoskeletal: Negative.   Skin: Negative.   Neurological: Negative.   Psychiatric/Behavioral: Negative.     Per HPI unless specifically indicated above     Objective:    BP 126/78 mmHg  Pulse 75  Temp(Src) 97.5 F (36.4 C)  Ht 5' 2.2" (1.58 m)  Wt 166 lb 9.6 oz (75.569 kg)  BMI 30.27 kg/m2  SpO2 95%  Wt Readings from Last 3 Encounters:  02/19/15 166 lb 9.6 oz (75.569 kg)  12/25/14 165 lb 14 oz (75.24 kg)  11/03/14 168 lb 6.4 oz (76.386 kg)    Physical Exam  Constitutional: He is oriented to person, place, and time. He appears well-developed and well-nourished. No distress.  HENT:  Head: Normocephalic and atraumatic.  Right Ear: External ear normal.  Left Ear: External ear normal.  Nose: Nose normal.  Neck: Normal range of motion. Neck supple.  Cardiovascular: Normal rate, regular rhythm, normal heart sounds and intact distal pulses.   Pulmonary/Chest: Effort normal. No respiratory distress. He has no wheezes. He has no rales.  Diminished breath sounds throughout  Musculoskeletal: Normal range of motion. He exhibits no edema or tenderness.  Neurological: He is alert and oriented to person, place, and time.  Skin: Skin is warm and dry. No rash noted. He is not diaphoretic. No erythema. No pallor.  Psychiatric: He has a normal mood and affect. His behavior is normal. Judgment and thought content normal.      Assessment & Plan:   Problem List Items Addressed  This Visit      Unprioritized   Diabetes mellitus without complication ([place])    Hgb A1C reviewed results 8.6 while hospitalized pt was taking Prednisone resulting in elevated blood sugars ranging between 300 to 400's.  He is currently taking his blood sugars at home his fasting blood sugars have decreased readings have been 120-130's  Instructed pt to continue current medications will recheck HgB A1C in 3 months      Relevant  Medications   metFORMIN (GLUCOPHAGE) 1000 MG tablet   Other Relevant Orders   Bayer DCA Hb A1c Waived   Uric acid   Microalbumin, Urine Waived   Comprehensive metabolic panel   Hyperlipidemia    Lipid panel results reviewed LDL 35 Total Cholesterol 120 Stable, continue present medications.        Relevant Orders   Lipid Panel Piccolo, Waived   COPD, severe ([place])    Ordered Ambulatory Referral to Pulmonology Instructed pt to take Duonebs as needed q6hrs for wheezing or shortness of breath      Relevant Orders   Ambulatory referral to Pulmonology   Hypertension, accelerated with heart disease, without CHF    Comprehensive metabolic panel ordered results pending Uric acid ordered results pending       Relevant Orders   Uric acid   Microalbumin, Urine Waived   Comprehensive metabolic panel    Other Visit Diagnoses    Immunization due    -  Primary    Relevant Orders    Flu Vaccine QUAD 36+ mos PF IM (Fluarix & Fluzone Quad PF) (Completed)        Follow up plan: Return in about 3 months (around 05/22/2015).

## 2015-02-19 NOTE — Assessment & Plan Note (Addendum)
Lipid panel results reviewed LDL 35 Total Cholesterol 120 Stable, continue present medications.

## 2015-02-19 NOTE — Assessment & Plan Note (Addendum)
Ordered Ambulatory Referral to Pulmonology Instructed pt to take Duonebs as needed q6hrs for wheezing or shortness of breath

## 2015-02-19 NOTE — Assessment & Plan Note (Signed)
Comprehensive metabolic panel ordered results pending Uric acid ordered results pending

## 2015-02-20 LAB — LIPID PANEL PICCOLO, WAIVED
Chol/HDL Ratio Piccolo,Waive: 7.6 mg/dL — ABNORMAL HIGH
Cholesterol Piccolo, Waived: 120 mg/dL (ref <200)
HDL Chol Piccolo, Waived: 16 mg/dL — ABNORMAL LOW (ref >59)
LDL CHOL CALC PICCOLO WAIVED: 35 mg/dL (ref <100)
Triglycerides Piccolo,Waived: 343 mg/dL — ABNORMAL HIGH (ref <150)
VLDL CHOL CALC PICCOLO,WAIVE: 69 mg/dL — AB (ref <30)

## 2015-02-20 LAB — COMPREHENSIVE METABOLIC PANEL
A/G RATIO: 1.8 (ref 1.1–2.5)
ALK PHOS: 104 IU/L (ref 39–117)
ALT: 21 IU/L (ref 0–44)
AST: 20 IU/L (ref 0–40)
Albumin: 4.2 g/dL (ref 3.6–4.8)
BILIRUBIN TOTAL: 0.4 mg/dL (ref 0.0–1.2)
BUN/Creatinine Ratio: 10 (ref 10–22)
BUN: 9 mg/dL (ref 8–27)
CHLORIDE: 97 mmol/L (ref 97–108)
CO2: 26 mmol/L (ref 18–29)
Calcium: 9.4 mg/dL (ref 8.6–10.2)
Creatinine, Ser: 0.93 mg/dL (ref 0.76–1.27)
GFR calc Af Amer: 103 mL/min/{1.73_m2} (ref >59)
GFR calc non Af Amer: 89 mL/min/{1.73_m2} (ref >59)
GLOBULIN, TOTAL: 2.4 g/dL (ref 1.5–4.5)
Glucose: 215 mg/dL — ABNORMAL HIGH (ref 65–99)
POTASSIUM: 4.4 mmol/L (ref 3.5–5.2)
SODIUM: 138 mmol/L (ref 134–144)
Total Protein: 6.6 g/dL (ref 6.0–8.5)

## 2015-02-20 LAB — MICROALBUMIN, URINE WAIVED
CREATININE, URINE WAIVED: 50 mg/dL (ref 10–300)
MICROALB, UR WAIVED: 10 mg/L (ref 0–19)
Microalb/Creat Ratio: <30 mg/g (ref <30)

## 2015-02-20 LAB — URIC ACID: Uric Acid: 4.3 mg/dL (ref 3.7–8.6)

## 2015-02-20 LAB — BAYER DCA HB A1C WAIVED: HB A1C (BAYER DCA - WAIVED): 8.7 % — ABNORMAL HIGH (ref <7.0)

## 2015-02-23 DIAGNOSIS — G4733 Obstructive sleep apnea (adult) (pediatric): Secondary | ICD-10-CM | POA: Diagnosis not present

## 2015-02-23 DIAGNOSIS — J449 Chronic obstructive pulmonary disease, unspecified: Secondary | ICD-10-CM | POA: Diagnosis not present

## 2015-02-23 DIAGNOSIS — F17200 Nicotine dependence, unspecified, uncomplicated: Secondary | ICD-10-CM | POA: Diagnosis not present

## 2015-03-05 DIAGNOSIS — G4733 Obstructive sleep apnea (adult) (pediatric): Secondary | ICD-10-CM | POA: Diagnosis not present

## 2015-04-06 ENCOUNTER — Encounter: Payer: Self-pay | Admitting: Unknown Physician Specialty

## 2015-04-06 ENCOUNTER — Ambulatory Visit (INDEPENDENT_AMBULATORY_CARE_PROVIDER_SITE_OTHER): Payer: Commercial Managed Care - HMO | Admitting: Unknown Physician Specialty

## 2015-04-06 VITALS — BP 119/78 | HR 88 | Temp 97.5°F | Ht 62.6 in | Wt 166.8 lb

## 2015-04-06 DIAGNOSIS — J441 Chronic obstructive pulmonary disease with (acute) exacerbation: Secondary | ICD-10-CM | POA: Diagnosis not present

## 2015-04-06 DIAGNOSIS — J449 Chronic obstructive pulmonary disease, unspecified: Secondary | ICD-10-CM | POA: Diagnosis not present

## 2015-04-06 DIAGNOSIS — Z720 Tobacco use: Secondary | ICD-10-CM | POA: Diagnosis not present

## 2015-04-06 MED ORDER — DOXYCYCLINE HYCLATE 100 MG PO TABS: 100.0000 mg | ORAL_TABLET | Freq: Two times a day (BID) | ORAL | Status: DC

## 2015-04-06 MED ORDER — BUDESONIDE-FORMOTEROL FUMARATE 160-4.5 MCG/ACT IN AERO: 2.0000 | INHALATION_SPRAY | Freq: Two times a day (BID) | RESPIRATORY_TRACT | Status: DC

## 2015-04-06 NOTE — Assessment & Plan Note (Signed)
Encouraged to quit smoking.  Pt ed given

## 2015-04-06 NOTE — Progress Notes (Signed)
BP 119/78 mmHg  Pulse 88  Temp(Src) 97.5 F (36.4 C)  Ht 5' 2.6" (1.59 m)  Wt 166 lb 12.8 oz (75.66 kg)  BMI 29.93 kg/m2  SpO2 92%   Subjective:    Patient ID: John Frank, male    DOB: [birth year], 59 y.o.   MRN: [id number]  HPI: John Frank is a 59 y.o. male  Chief Complaint  Patient presents with  . Cough    pt states he has had a cough (productive), some sore throat, chest pain when coughing, and some headache. States symptoms started about 2 weeks ago.   Cough This is a new problem. The current episode started 1 to 4 weeks ago. The problem has been gradually worsening. The problem occurs constantly. The cough is productive of purulent sputum. Associated symptoms include nasal congestion, shortness of breath and wheezing. Pertinent negatives include no chest pain, chills, ear congestion, ear pain, sweats or weight loss. Exacerbated by: not sure. His past medical history is significant for COPD.   Pt was in the hospital 2 months ago for his COPD.  He is taking his duoneb every 4 hours but not taking symbicort aor the Spiriva.  He is seeing Dr. Raul Del and just taking his duoneb and states Dr. Raul Del took him off the others.    Relevant past medical, surgical, family and social history reviewed and updated as indicated. Interim medical history since our last visit reviewed. Allergies and medications reviewed and updated.  Review of Systems  Constitutional: Negative for chills and weight loss.  HENT: Negative for ear pain.   Respiratory: Positive for cough, shortness of breath and wheezing.   Cardiovascular: Negative for chest pain.    Per HPI unless specifically indicated above     Objective:    BP 119/78 mmHg  Pulse 88  Temp(Src) 97.5 F (36.4 C)  Ht 5' 2.6" (1.59 m)  Wt 166 lb 12.8 oz (75.66 kg)  BMI 29.93 kg/m2  SpO2 92%  Wt Readings from Last 3 Encounters:  04/06/15 166 lb 12.8 oz (75.66 kg)  02/19/15 166 lb 9.6 oz (75.569 kg)  12/25/14 165 lb 14 oz  (75.24 kg)    Physical Exam  Constitutional: He is oriented to person, place, and time. He appears well-developed and well-nourished. No distress.  HENT:  Head: Normocephalic and atraumatic.  Eyes: Conjunctivae and lids are normal. Right eye exhibits no discharge. Left eye exhibits no discharge. No scleral icterus.  Cardiovascular: Normal rate, regular rhythm and normal heart sounds.   Pulmonary/Chest: Effort normal. No respiratory distress. He has decreased breath sounds. He has wheezes. He has rhonchi. He has rales.  Abdominal: Normal appearance. There is no splenomegaly or hepatomegaly.  Musculoskeletal: Normal range of motion.  Neurological: He is alert and oriented to person, place, and time.  Skin: Skin is intact. No rash noted. No pallor.  Psychiatric: He has a normal mood and affect. His behavior is normal. Judgment and thought content normal.    Results for orders placed or performed in visit on 02/19/15  Bayer DCA Hb A1c Waived  Result Value Ref Range   Bayer DCA Hb A1c Waived 8.7 (H) <7.0 %  Lipid Panel Piccolo, Waived  Result Value Ref Range   Cholesterol Piccolo, Waived 120 <200 mg/dL   HDL Chol Piccolo, Waived 16 (L) >59 mg/dL   Triglycerides Piccolo,Waived 343 (H) <150 mg/dL   Chol/HDL Ratio Piccolo,Waive 7.6 (H) mg/dL   LDL Chol Calc Piccolo Waived 35 <100 mg/dL  VLDL Chol Calc Piccolo,Waive 69 (H) <30 mg/dL  Uric acid  Result Value Ref Range   Uric Acid 4.3 3.7 - 8.6 mg/dL  Microalbumin, Urine Waived  Result Value Ref Range   Microalb, Ur Waived 10 0 - 19 mg/L   Creatinine, Urine Waived 50 10 - 300 mg/dL   Microalb/Creat Ratio <30 <30 mg/g  Comprehensive metabolic panel  Result Value Ref Range   Glucose 215 (H) 65 - 99 mg/dL   BUN 9 8 - 27 mg/dL   Creatinine, Ser 0.93 0.76 - 1.27 mg/dL   GFR calc non Af Amer 89 >59 mL/min/1.73   GFR calc Af Amer 103 >59 mL/min/1.73   BUN/Creatinine Ratio 10 10 - 22   Sodium 138 134 - 144 mmol/L   Potassium 4.4 3.5 - 5.2  mmol/L   Chloride 97 97 - 108 mmol/L   CO2 26 18 - 29 mmol/L   Calcium 9.4 8.6 - 10.2 mg/dL   Total Protein 6.6 6.0 - 8.5 g/dL   Albumin 4.2 3.6 - 4.8 g/dL   Globulin, Total 2.4 1.5 - 4.5 g/dL   Albumin/Globulin Ratio 1.8 1.1 - 2.5   Bilirubin Total 0.4 0.0 - 1.2 mg/dL   Alkaline Phosphatase 104 39 - 117 IU/L   AST 20 0 - 40 IU/L   ALT 21 0 - 44 IU/L      Assessment & Plan:   Problem List Items Addressed This Visit      Unprioritized   COPD, severe ([facility]) - Primary   Relevant Medications   budesonide-formoterol (SYMBICORT) 160-4.5 MCG/ACT inhaler   COPD exacerbation ([facility])    Rx for Doxycycline.  Restart Symbicort while sick      Relevant Medications   budesonide-formoterol (SYMBICORT) 160-4.5 MCG/ACT inhaler   Tobacco abuse    Encouraged to quit smoking.  Pt ed given          Follow up plan: Return if symptoms worsen or fail to improve.

## 2015-04-06 NOTE — Assessment & Plan Note (Addendum)
Rx for Doxycycline.  Restart Symbicort while sick

## 2015-04-08 ENCOUNTER — Other Ambulatory Visit: Payer: Self-pay | Admitting: Unknown Physician Specialty

## 2015-04-26 ENCOUNTER — Telehealth: Payer: Self-pay

## 2015-04-26 NOTE — Telephone Encounter (Addendum)
I just saw him on the 25th and gave him antibiotics.  If he is still sick, or sick again, he needs to be seen.

## 2015-04-26 NOTE — Telephone Encounter (Signed)
Called and let patient know that John Frank wants him to be seen before anymore antibiotic can be given. I informed the patient that 2 providers were out of the office and the other 2 were booked today and tomorrow. I told the patient I was looking at Monday to get him in to see someone here. Patient asked when his next appointment was (05/22/15), I told him the date, and the patient said he would take some OTC medications and wait until then. I asked for the patient to please go to the walk in or give [place] a call back if he gets to feeling worse.

## 2015-04-26 NOTE — Telephone Encounter (Signed)
Patient called and left a voicemail asking if Malachy Mood could send him in something for a cold. States he is coughing up green stuff. [facility]acy [facility].

## 2015-05-01 ENCOUNTER — Emergency Department: Payer: Commercial Managed Care - HMO

## 2015-05-01 ENCOUNTER — Encounter: Payer: Self-pay | Admitting: Emergency Medicine

## 2015-05-01 ENCOUNTER — Emergency Department
Admission: EM | Admit: 2015-05-01 | Discharge: 2015-05-01 | Disposition: A | Discharge status: Home / Self Care | Payer: Commercial Managed Care - HMO | Attending: Emergency Medicine | Admitting: Emergency Medicine

## 2015-05-01 DIAGNOSIS — I10 Essential (primary) hypertension: Secondary | ICD-10-CM | POA: Insufficient documentation

## 2015-05-01 DIAGNOSIS — Z7952 Long term (current) use of systemic steroids: Secondary | ICD-10-CM | POA: Diagnosis not present

## 2015-05-01 DIAGNOSIS — Z7984 Long term (current) use of oral hypoglycemic drugs: Secondary | ICD-10-CM | POA: Diagnosis not present

## 2015-05-01 DIAGNOSIS — F17210 Nicotine dependence, cigarettes, uncomplicated: Secondary | ICD-10-CM | POA: Diagnosis not present

## 2015-05-01 DIAGNOSIS — J205 Acute bronchitis due to respiratory syncytial virus: Secondary | ICD-10-CM | POA: Diagnosis not present

## 2015-05-01 DIAGNOSIS — Z7951 Long term (current) use of inhaled steroids: Secondary | ICD-10-CM | POA: Diagnosis not present

## 2015-05-01 DIAGNOSIS — E119 Type 2 diabetes mellitus without complications: Secondary | ICD-10-CM | POA: Diagnosis not present

## 2015-05-01 DIAGNOSIS — R911 Solitary pulmonary nodule: Secondary | ICD-10-CM | POA: Diagnosis not present

## 2015-05-01 DIAGNOSIS — Z7982 Long term (current) use of aspirin: Secondary | ICD-10-CM | POA: Insufficient documentation

## 2015-05-01 DIAGNOSIS — Z792 Long term (current) use of antibiotics: Secondary | ICD-10-CM | POA: Diagnosis not present

## 2015-05-01 DIAGNOSIS — Z79899 Other long term (current) drug therapy: Secondary | ICD-10-CM | POA: Insufficient documentation

## 2015-05-01 DIAGNOSIS — R05 Cough: Secondary | ICD-10-CM | POA: Diagnosis not present

## 2015-05-01 LAB — CBC WITH DIFFERENTIAL/PLATELET
BASOS ABS: 0.2 10*3/uL — AB (ref 0–0.1)
Basophils Relative: 2 %
EOS PCT: 4 %
Eosinophils Absolute: 0.4 10*3/uL (ref 0–0.7)
HCT: 45.1 % (ref 40.0–52.0)
Hemoglobin: 14.6 g/dL (ref 13.0–18.0)
LYMPHS PCT: 33 %
Lymphs Abs: 3.7 10*3/uL — ABNORMAL HIGH (ref 1.0–3.6)
MCH: 29.8 pg (ref 26.0–34.0)
MCHC: 32.4 g/dL (ref 32.0–36.0)
MCV: 91.7 fL (ref 80.0–100.0)
Monocytes Absolute: 0.9 10*3/uL (ref 0.2–1.0)
Monocytes Relative: 9 %
NEUTROS ABS: 6 10*3/uL (ref 1.4–6.5)
NEUTROS PCT: 52 %
PLATELETS: 273 10*3/uL (ref 150–440)
RBC: 4.92 MIL/uL (ref 4.40–5.90)
RDW: 15.8 % — AB (ref 11.5–14.5)
WBC: 11.2 10*3/uL — AB (ref 3.8–10.6)

## 2015-05-01 LAB — POCT RAPID STREP A: STREPTOCOCCUS, GROUP A SCREEN (DIRECT): NEGATIVE

## 2015-05-01 MED ORDER — GUAIFENESIN-CODEINE 100-10 MG/5ML PO SOLN: 10.0000 mL | ORAL | Status: DC | PRN

## 2015-05-01 MED ORDER — AZITHROMYCIN 250 MG PO TABS: ORAL_TABLET | ORAL | Status: DC

## 2015-05-01 NOTE — ED Provider Notes (Signed)
[facility] Emergency Department Provider Note  ____________________________________________  Time seen: Approximately 9:58 PM  I have reviewed the triage vital signs and the nursing notes.   HISTORY  Chief Complaint Cough   HPI John Frank is a 59 y.o. male resents for evaluation of cough and flu symptoms 2 weeks. Patient states no relief with over-the-counter medications.   Past Medical History  Diagnosis Date  . Diabetes ([place])   . Heart attack ([place])   . Shortness of breath dyspnea   . Hyperlipidemia   . Hypertension   . GERD (gastroesophageal reflux disease)   . Presence of permanent cardiac pacemaker   . Sleep apnea   . COPD (chronic obstructive pulmonary disease) [facility])     Patient Active Problem List   Diagnosis Date Noted  . Tobacco abuse 04/06/2015  . COPD exacerbation ([place]) 12/24/2014  . Hypertension, accelerated with heart disease, without CHF 11/03/2014  . Sleep apnea 10/26/2014  . CAD (coronary artery disease) 10/26/2014  . GERD (gastroesophageal reflux disease) 10/26/2014  . Polyp of colon 10/26/2014  . Diabetes mellitus without complication ([place]) 62/83/6629  . Hyperlipidemia 10/26/2014  . COPD, severe ([place]) 10/26/2014  . Status cardiac pacemaker 10/26/2014  . Lesion of nasal septum 10/26/2014    Past Surgical History  Procedure Laterality Date  . Cardiac surgery    . Esophagogastroduodenoscopy N/A 09/19/2014    Procedure: ESOPHAGOGASTRODUODENOSCOPY (EGD);  Surgeon: Lucilla Lame, MD;  Location: [facility] ENDOSCOPY;  Service: Endoscopy;  Laterality: N/A;    Current Outpatient Rx  Name  Route  Sig  Dispense  Refill  . aspirin EC 81 MG tablet   Oral   Take 81 mg by mouth daily.         Marland Kitchen atorvastatin (LIPITOR) 40 MG tablet   Oral   Take 1 tablet (40 mg total) by mouth daily.   90 tablet   1   . azithromycin (ZITHROMAX Z-PAK) 250 MG tablet      Take 2 tablets (500 mg) on  Day 1,  followed by 1 tablet (250 mg) once  daily on Days 2 through 5.   6 each   0   . budesonide-formoterol (SYMBICORT) 160-4.5 MCG/ACT inhaler   Inhalation   Inhale 2 puffs into the lungs 2 (two) times daily.   1 Inhaler   3   . clopidogrel (PLAVIX) 75 MG tablet   Oral   Take 75 mg by mouth daily.         Marland Kitchen doxycycline (VIBRA-TABS) 100 MG tablet   Oral   Take 1 tablet (100 mg total) by mouth 2 (two) times daily.   20 tablet   0   . enalapril (VASOTEC) 5 MG tablet   Oral   Take 5 mg by mouth every evening.         . famotidine (PEPCID) 40 MG tablet   Oral   Take 40 mg by mouth daily.         Marland Kitchen guaiFENesin-codeine 100-10 MG/5ML syrup   Oral   Take 10 mLs by mouth every 4 (four) hours as needed for cough.   180 mL   1   . ipratropium-albuterol (DUONEB) 0.5-2.5 (3) MG/3ML SOLN   Nebulization   Take 3 mLs by nebulization every 6 (six) hours as needed (wheezing; shortness of breath).         Marland Kitchen levofloxacin (LEVAQUIN) 500 MG tablet   Oral   Take 1 tablet (500 mg total) by mouth daily.  6 tablet   0   . metFORMIN (GLUCOPHAGE) 1000 MG tablet   Oral   Take 1 tablet (1,000 mg total) by mouth 2 (two) times daily with a meal.   180 tablet   1   . metoprolol succinate (TOPROL-XL) 50 MG 24 hr tablet      TAKE 1 TABLET (50 MG TOTAL) BY MOUTH DAILY. NEED F/U APPT FOR ADDITIONAL REFILLS   90 tablet   0   . nitroGLYCERIN (NITROSTAT) 0.4 MG SL tablet   Sublingual   Place 0.4 mg under the tongue every 5 (five) minutes as needed for chest pain.         . pantoprazole (PROTONIX) 40 MG tablet   Oral   Take 40 mg by mouth every morning.         . predniSONE (DELTASONE) 20 MG tablet   Oral   Take 2 tablets (40 mg total) by mouth daily with breakfast.   8 tablet   0   . sitaGLIPtin (JANUVIA) 100 MG tablet   Oral   Take 1 tablet (100 mg total) by mouth daily.   90 tablet   1     Allergies Review of patient's allergies indicates no known allergies.  Family History  Problem Relation Age of  Onset  . Diabetes Mother   . Heart attack Father 62    Social History Social History  Substance Use Topics  . Smoking status: Current Every Day Smoker -- 0.50 packs/day for 40 years    Types: Cigarettes  . Smokeless tobacco: Former Systems developer  . Alcohol Use: No    Review of Systems Constitutional: No fever/chills Eyes: No visual changes. ENT: No sore throat. Cardiovascular: Denies chest pain. Respiratory: Denies shortness of breath. Gastrointestinal: No abdominal pain.  No nausea, no vomiting.  No diarrhea.  No constipation. Genitourinary: Negative for dysuria. Musculoskeletal: Negative for back pain. Skin: Negative for rash. Neurological: Negative for headaches, focal weakness or numbness.  10-point ROS otherwise negative.  ____________________________________________   PHYSICAL EXAM:  VITAL SIGNS: ED Triage Vitals  Enc Vitals Group     BP 05/01/15 1946 125/65 mmHg     Pulse Rate 05/01/15 1946 74     Resp 05/01/15 1946 18     Temp 05/01/15 1946 97.8 F (36.6 C)     Temp Source 05/01/15 1946 Oral     SpO2 05/01/15 1946 93 %     Weight 05/01/15 1946 145 lb (65.772 kg)     Height 05/01/15 1946 '5\' 4"'$  (1.626 m)     Head Cir --      Peak Flow --      Pain Score 05/01/15 1948 6     Pain Loc --      Pain Edu? --      Excl. in [place]? --     Constitutional: Alert and oriented. Well appearing and in no acute distress. Eyes: Conjunctivae are normal. PERRL. EOMI. Head: Atraumatic. Nose: No congestion/rhinnorhea. Mouth/Throat: Mucous membranes are moist.  Oropharynx non-erythematous. Neck: No stridor.   Cardiovascular: Normal rate, regular rhythm. Grossly normal heart sounds.  Good peripheral circulation. Respiratory: Normal respiratory effort.  No retractions. Lungs CTAB no coarse rhonchi or wheezing noted. Musculoskeletal: No lower extremity tenderness nor edema.  No joint effusions. Neurologic:  Normal speech and language. No gross focal neurologic deficits are appreciated.  No gait instability. Skin:  Skin is warm, dry and intact. No rash noted. Psychiatric: Mood and affect are normal. Speech and behavior are normal.  ____________________________________________   LABS (all labs ordered are listed, but only abnormal results are displayed)  Labs Reviewed  CBC WITH DIFFERENTIAL/PLATELET - Abnormal; Notable for the following:    WBC 11.2 (*)    RDW 15.8 (*)    Lymphs Abs 3.7 (*)    Basophils Absolute 0.2 (*)    All other components within normal limits  POCT RAPID STREP A   ____________________________________________   RADIOLOGY  The right apical nodule described on the prior radiograph is not seen on this CT and likely corresponds to a focal area of sclerosis of the right first rib. Partially calcified nodular density in the right lower lobe likely represent granuloma. No acute intrathoracic pathology identified. ____________________________________________   PROCEDURES  Procedure(s) performed: None  Critical Care performed: No  ____________________________________________   INITIAL IMPRESSION / ASSESSMENT AND PLAN / ED COURSE  Pertinent labs & imaging results that were available during my care of the patient were reviewed by me and considered in my medical decision making (see chart for details).  Acute bronchitis. Rx given for Z-Pak, Robitussin-AC. Patient follow-up PCP or return here with any worsening symptomology. Patient voices no other emergency medical complaints at this time. ____________________________________________   FINAL CLINICAL IMPRESSION(S) / ED DIAGNOSES  Final diagnoses:  Acute bronchitis due to respiratory syncytial virus (RSV)      Arlyss Repress, PA-C 05/01/15 [id number]  Nance Pear, MD 05/02/15 [phone]

## 2015-05-01 NOTE — Discharge Instructions (Signed)

## 2015-05-01 NOTE — ED Notes (Signed)
Pt presents with complaints of cough and cold/flu symptoms since Thanksgiving.

## 2015-05-02 ENCOUNTER — Other Ambulatory Visit: Payer: Self-pay | Admitting: Unknown Physician Specialty

## 2015-05-20 ENCOUNTER — Emergency Department: Payer: Commercial Managed Care - HMO

## 2015-05-20 ENCOUNTER — Other Ambulatory Visit: Payer: Self-pay

## 2015-05-20 ENCOUNTER — Emergency Department
Admission: EM | Admit: 2015-05-20 | Discharge: 2015-05-20 | Disposition: A | Discharge status: Home / Self Care | Payer: Commercial Managed Care - HMO | Attending: Emergency Medicine | Admitting: Emergency Medicine

## 2015-05-20 ENCOUNTER — Encounter: Payer: Self-pay | Admitting: Emergency Medicine

## 2015-05-20 DIAGNOSIS — E119 Type 2 diabetes mellitus without complications: Secondary | ICD-10-CM | POA: Insufficient documentation

## 2015-05-20 DIAGNOSIS — Z7902 Long term (current) use of antithrombotics/antiplatelets: Secondary | ICD-10-CM | POA: Diagnosis not present

## 2015-05-20 DIAGNOSIS — Z7984 Long term (current) use of oral hypoglycemic drugs: Secondary | ICD-10-CM | POA: Diagnosis not present

## 2015-05-20 DIAGNOSIS — I2510 Atherosclerotic heart disease of native coronary artery without angina pectoris: Secondary | ICD-10-CM | POA: Insufficient documentation

## 2015-05-20 DIAGNOSIS — Z792 Long term (current) use of antibiotics: Secondary | ICD-10-CM | POA: Diagnosis not present

## 2015-05-20 DIAGNOSIS — Z7952 Long term (current) use of systemic steroids: Secondary | ICD-10-CM | POA: Diagnosis not present

## 2015-05-20 DIAGNOSIS — F17210 Nicotine dependence, cigarettes, uncomplicated: Secondary | ICD-10-CM | POA: Diagnosis not present

## 2015-05-20 DIAGNOSIS — Z7982 Long term (current) use of aspirin: Secondary | ICD-10-CM | POA: Diagnosis not present

## 2015-05-20 DIAGNOSIS — R079 Chest pain, unspecified: Secondary | ICD-10-CM | POA: Insufficient documentation

## 2015-05-20 DIAGNOSIS — Z79899 Other long term (current) drug therapy: Secondary | ICD-10-CM | POA: Insufficient documentation

## 2015-05-20 DIAGNOSIS — I119 Hypertensive heart disease without heart failure: Secondary | ICD-10-CM | POA: Insufficient documentation

## 2015-05-20 DIAGNOSIS — Z7951 Long term (current) use of inhaled steroids: Secondary | ICD-10-CM | POA: Diagnosis not present

## 2015-05-20 DIAGNOSIS — R0789 Other chest pain: Secondary | ICD-10-CM | POA: Diagnosis not present

## 2015-05-20 LAB — CBC
HEMATOCRIT: 40.3 % (ref 40.0–52.0)
Hemoglobin: 13.5 g/dL (ref 13.0–18.0)
MCH: 29.9 pg (ref 26.0–34.0)
MCHC: 33.5 g/dL (ref 32.0–36.0)
MCV: 89.2 fL (ref 80.0–100.0)
Platelets: 266 10*3/uL (ref 150–440)
RBC: 4.52 MIL/uL (ref 4.40–5.90)
RDW: 15.6 % — AB (ref 11.5–14.5)
WBC: 8.7 10*3/uL (ref 3.8–10.6)

## 2015-05-20 LAB — BASIC METABOLIC PANEL
Anion gap: 8 (ref 5–15)
BUN: 10 mg/dL (ref 6–20)
CHLORIDE: 100 mmol/L — AB (ref 101–111)
CO2: 29 mmol/L (ref 22–32)
Calcium: 9.6 mg/dL (ref 8.9–10.3)
Creatinine, Ser: 1.09 mg/dL (ref 0.61–1.24)
GFR calc Af Amer: >60 mL/min (ref >60)
GLUCOSE: 160 mg/dL — AB (ref 65–99)
POTASSIUM: 4.6 mmol/L (ref 3.5–5.1)
Sodium: 137 mmol/L (ref 135–145)

## 2015-05-20 LAB — TROPONIN I
Troponin I: <0.03 ng/mL (ref <0.031)
Troponin I: <0.03 ng/mL (ref <0.031)

## 2015-05-20 MED ORDER — PENTAFLUOROPROP-TETRAFLUOROETH EX AERO
INHALATION_SPRAY | CUTANEOUS | Status: AC
  Filled 2015-05-20: qty 30

## 2015-05-20 MED ORDER — IBUPROFEN 600 MG PO TABS
600.0000 mg | ORAL_TABLET | Freq: Once | ORAL | Status: AC
  Administered 2015-05-20: 600 mg via ORAL
  Filled 2015-05-20: qty 1

## 2015-05-20 NOTE — Discharge Instructions (Signed)
Nonspecific Chest Pain It is often hard to find the cause of chest pain. There is always a chance that your pain could be related to something serious, such as a heart attack or a blood clot in your lungs. Chest pain can also be caused by conditions that are not life-threatening. If you have chest pain, it is very important to follow up with your doctor.  HOME CARE  If you were prescribed an antibiotic medicine, finish it all even if you start to feel better.  Avoid any activities that cause chest pain.  Do not use any tobacco products, including cigarettes, chewing tobacco, or electronic cigarettes. If you need help quitting, ask your doctor.  Do not drink alcohol.  Take medicines only as told by your doctor.  Keep all follow-up visits as told by your doctor. This is important. This includes any further testing if your chest pain does not go away.  Your doctor may tell you to keep your head raised (elevated) while you sleep.  Make lifestyle changes as told by your doctor. These may include:  Getting regular exercise. Ask your doctor to suggest some activities that are safe for you.  Eating a heart-healthy diet. Your doctor or a diet specialist (dietitian) can help you to learn healthy eating options.  Maintaining a healthy weight.  Managing diabetes, if necessary.  Reducing stress. GET HELP IF:  Your chest pain does not go away, even after treatment.  You have a rash with blisters on your chest.  You have a fever. GET HELP RIGHT AWAY IF:  Your chest pain is worse.  You have an increasing cough, or you cough up blood.  You have severe belly (abdominal) pain.  You feel extremely weak.  You pass out (faint).  You have chills.  You have sudden, unexplained chest discomfort.  You have sudden, unexplained discomfort in your arms, back, neck, or jaw.  You have shortness of breath at any time.  You suddenly start to sweat, or your skin gets clammy.  You feel  nauseous.  You vomit.  You suddenly feel light-headed or dizzy.  Your heart begins to beat quickly, or it feels like it is skipping beats. These symptoms may be an emergency. Do not wait to see if the symptoms will go away. Get medical help right away. Call your local emergency services (911 in the [place] Do not drive yourself to the hospital.   This information is not intended to replace advice given to you by your health care provider. Make sure you discuss any questions you have with your health care provider.   Document Released: 10/15/2007 Document Revised: 05/19/2014 Document Reviewed: 12/02/2013 Elsevier Interactive Patient Education [employer].  Please continue with over-the-counter pain medication. Drink plenty of fluids. Return here especially if the pain associated with other symptoms such as nausea, vomiting, shortness of breath etc.

## 2015-05-20 NOTE — ED Notes (Signed)
Pt reports left chest pain that has been intermittent around pacemaker/defibrillator for last 2 hours. Unable to describe pain.  No other sx with pain.

## 2015-05-20 NOTE — ED Notes (Signed)
Patient states "I am hurting around my heart." Denies nausea, vomiting, and shortness of breath. Patient states that the pain started a few hours ago. Patient denies worsening of pain with activity. Patient denies cough, fever, and swelling of the lower legs.

## 2015-05-20 NOTE — ED Provider Notes (Signed)
Time Seen: Approximately 1515 I have reviewed the triage notes  Chief Complaint: Chest Pain   History of Present Illness: John Frank is a 60 y.o. male who states chest discomfort underlying where his defibrillator is located. He states his defibrillator has not fired. He was concerned because the pain abated and rather persistent over the last 4 hours prior to arrival. He denies any associated symptoms such as nausea, vomiting, shortness of breath. He denies any fever or chills or productive cough. He denies any reproducible pain with movement. He denies any pleuritic or positional component to his discomfort. He denies any calf tenderness or swelling. He denies any radiation of the pain to the arm, jaw, or neck region.   Past Medical History  Diagnosis Date  . Diabetes ([place])   . Heart attack ([place])   . Shortness of breath dyspnea   . Hyperlipidemia   . Hypertension   . GERD (gastroesophageal reflux disease)   . Presence of permanent cardiac pacemaker   . Sleep apnea   . COPD (chronic obstructive pulmonary disease) [facility])     Patient Active Problem List   Diagnosis Date Noted  . Tobacco abuse 04/06/2015  . COPD exacerbation ([place]) 12/24/2014  . Hypertension, accelerated with heart disease, without CHF 11/03/2014  . Sleep apnea 10/26/2014  . CAD (coronary artery disease) 10/26/2014  . GERD (gastroesophageal reflux disease) 10/26/2014  . Polyp of colon 10/26/2014  . Diabetes mellitus without complication ([place]) 66/10/3014  . Hyperlipidemia 10/26/2014  . COPD, severe ([place]) 10/26/2014  . Status cardiac pacemaker 10/26/2014  . Lesion of nasal septum 10/26/2014    Past Surgical History  Procedure Laterality Date  . Cardiac surgery    . Esophagogastroduodenoscopy N/A 09/19/2014    Procedure: ESOPHAGOGASTRODUODENOSCOPY (EGD);  Surgeon: Lucilla Lame, MD;  Location: [facility] ENDOSCOPY;  Service: Endoscopy;  Laterality: N/A;    Past Surgical History  Procedure Laterality Date  .  Cardiac surgery    . Esophagogastroduodenoscopy N/A 09/19/2014    Procedure: ESOPHAGOGASTRODUODENOSCOPY (EGD);  Surgeon: Lucilla Lame, MD;  Location: [facility] ENDOSCOPY;  Service: Endoscopy;  Laterality: N/A;    Current Outpatient Rx  Name  Route  Sig  Dispense  Refill  . aspirin EC 81 MG tablet   Oral   Take 81 mg by mouth daily.         Marland Kitchen atorvastatin (LIPITOR) 40 MG tablet   Oral   Take 1 tablet (40 mg total) by mouth daily.   90 tablet   1   . azithromycin (ZITHROMAX Z-PAK) 250 MG tablet      Take 2 tablets (500 mg) on  Day 1,  followed by 1 tablet (250 mg) once daily on Days 2 through 5.   6 each   0   . budesonide-formoterol (SYMBICORT) 160-4.5 MCG/ACT inhaler   Inhalation   Inhale 2 puffs into the lungs 2 (two) times daily.   1 Inhaler   3   . clopidogrel (PLAVIX) 75 MG tablet   Oral   Take 75 mg by mouth daily.         Marland Kitchen doxycycline (VIBRA-TABS) 100 MG tablet   Oral   Take 1 tablet (100 mg total) by mouth 2 (two) times daily.   20 tablet   0   . enalapril (VASOTEC) 5 MG tablet      TAKE 1 TABLET (5 MG TOTAL) BY MOUTH DAILY. NEED F/U APPT FOR ADDITIONAL REFILLS   30 tablet   1   . famotidine (  PEPCID) 40 MG tablet   Oral   Take 40 mg by mouth daily.         Marland Kitchen guaiFENesin-codeine 100-10 MG/5ML syrup   Oral   Take 10 mLs by mouth every 4 (four) hours as needed for cough.   180 mL   1   . ipratropium-albuterol (DUONEB) 0.5-2.5 (3) MG/3ML SOLN   Nebulization   Take 3 mLs by nebulization every 6 (six) hours as needed (wheezing; shortness of breath).         Marland Kitchen levofloxacin (LEVAQUIN) 500 MG tablet   Oral   Take 1 tablet (500 mg total) by mouth daily.   6 tablet   0   . metFORMIN (GLUCOPHAGE) 1000 MG tablet   Oral   Take 1 tablet (1,000 mg total) by mouth 2 (two) times daily with a meal.   180 tablet   1   . metoprolol succinate (TOPROL-XL) 50 MG 24 hr tablet      TAKE 1 TABLET (50 MG TOTAL) BY MOUTH DAILY. NEED F/U APPT FOR ADDITIONAL  REFILLS   90 tablet   0   . nitroGLYCERIN (NITROSTAT) 0.4 MG SL tablet   Sublingual   Place 0.4 mg under the tongue every 5 (five) minutes as needed for chest pain.         . pantoprazole (PROTONIX) 40 MG tablet   Oral   Take 40 mg by mouth every morning.         . predniSONE (DELTASONE) 20 MG tablet   Oral   Take 2 tablets (40 mg total) by mouth daily with breakfast.   8 tablet   0   . sitaGLIPtin (JANUVIA) 100 MG tablet   Oral   Take 1 tablet (100 mg total) by mouth daily.   90 tablet   1     Allergies:  Review of patient's allergies indicates no known allergies.  Family History: Family History  Problem Relation Age of Onset  . Diabetes Mother   . Heart attack Father 59    Social History: Social History  Substance Use Topics  . Smoking status: Current Every Day Smoker -- 0.50 packs/day for 40 years    Types: Cigarettes  . Smokeless tobacco: Former Systems developer  . Alcohol Use: No     Review of Systems:   10 point review of systems was performed and was otherwise negative:  Constitutional: No fever Eyes: No visual disturbances ENT: No sore throat, ear pain Cardiac: He exclusively left-sided with no other associated symptoms Respiratory: No shortness of breath, wheezing, or stridor Abdomen: No abdominal pain, no vomiting, No diarrhea Endocrine: No weight loss, No night sweats Extremities: No peripheral edema, cyanosis Skin: No rashes, easy bruising Neurologic: No focal weakness, trouble with speech or swollowing Urologic: No dysuria, Hematuria, or urinary frequency   Physical Exam:  ED Triage Vitals  Enc Vitals Group     BP 05/20/15 1542 118/95 mmHg     Pulse Rate 05/20/15 1542 79     Resp 05/20/15 1542 15     Temp 05/20/15 1542 97.7 F (36.5 C)     Temp Source 05/20/15 1542 Oral     SpO2 05/20/15 1542 97 %     Weight 05/20/15 1542 140 lb (63.504 kg)     Height 05/20/15 1542 '5\' 4"'$  (1.626 m)     Head Cir --      Peak Flow --      Pain Score  05/20/15 1543 7  Pain Loc --      Pain Edu? --      Excl. in [place]? --     General: Awake , Alert , and Oriented times 3; GCS 15 Head: Normal cephalic , atraumatic Eyes: Pupils equal , round, reactive to light Nose/Throat: No nasal drainage, patent upper airway without erythema or exudate.  Neck: Supple, Full range of motion, No anterior adenopathy or palpable thyroid masses Lungs: Clear to ascultation without wheezes , rhonchi, or rales Heart: Regular rate, regular rhythm without murmurs , gallops , or rubs Abdomen: Soft, non tender without rebound, guarding , or rigidity; bowel sounds positive and symmetric in all 4 quadrants. No organomegaly .        Extremities: 2 plus symmetric pulses. No edema, clubbing or cyanosis Neurologic: normal ambulation, Motor symmetric without deficits, sensory intact Skin: warm, dry, no rashes Palpation of the chest wall does show some reproducible component to his discomfort to palpation over where his defibrillator is. There is no fluctuance or erythema, etc.  Labs:   All laboratory work was reviewed including any pertinent negatives or positives listed below:  Labs Reviewed  BASIC METABOLIC PANEL - Abnormal; Notable for the following:    Chloride 100 (*)    Glucose, Bld 160 (*)    All other components within normal limits  CBC - Abnormal; Notable for the following:    RDW 15.6 (*)    All other components within normal limits  TROPONIN I  TROPONIN I    EKG:  ED ECG REPORT I, Daymon Larsen, the attending physician, personally viewed and interpreted this ECG.  Date: 05/20/2015 EKG Time: 1537 Rate: 82 Rhythm: normal sinus rhythm QRS Axis: normal Intervals: normal ST/T Wave abnormalities: normal Conduction Disutrbances: none Narrative Interpretation: unremarkable   EXAM: CHEST 2 VIEW  COMPARISON: CT of the thorax dated 05/01/2015, chest radiograph dated 12/24/2014.  FINDINGS: Dual lead cardiac pacemaker is  stable.  Cardiomediastinal silhouette is normal. Mediastinal contours appear intact.  There is no evidence of focal airspace consolidation, pleural effusion or pneumothorax. There is mild hyperinflation of the lungs with coarsening of the interstitial markings.  Osseous structures are without acute abnormality. Soft tissues are grossly normal.  IMPRESSION: Mild hyperinflation of the lungs with coarsening interstitial markings, likely secondary to changes of COPD. Radiology:       I personally reviewed the radiologic studies     ED Course: Differential includes all life-threatening causes for chest pain. This includes but is not exclusive to acute coronary syndrome, aortic dissection, pulmonary embolism, cardiac tamponade, community-acquired pneumonia, pericarditis, musculoskeletal chest wall pain, etc. Given the patient's current clinical presentation and objective findings I felt this is most likely chest wall discomfort. Patient had serial troponins which were negative. Pain was relieved with ibuprofen tablet here in emergency department. He expresses no other symptoms. His pain is reproducible with palpation over the chest wall region.    Assessment: * Acute unspecified chest pain likely chest wall discomfort  Final Clinical Impression:   Final diagnoses:  Chest pain, unspecified chest pain type     Plan:  Outpatient management Patient was advised to return immediately if condition worsens. Patient was advised to follow up with their primary care physician or other specialized physicians involved in their outpatient care           Daymon Larsen, MD 05/20/15 1942

## 2015-05-22 ENCOUNTER — Ambulatory Visit: Payer: Commercial Managed Care - HMO | Admitting: Unknown Physician Specialty

## 2015-06-07 DIAGNOSIS — G4733 Obstructive sleep apnea (adult) (pediatric): Secondary | ICD-10-CM | POA: Diagnosis not present

## 2015-06-15 ENCOUNTER — Encounter: Payer: Self-pay | Admitting: Unknown Physician Specialty

## 2015-06-15 ENCOUNTER — Ambulatory Visit (INDEPENDENT_AMBULATORY_CARE_PROVIDER_SITE_OTHER): Payer: Medicare Other | Admitting: Unknown Physician Specialty

## 2015-06-15 VITALS — BP 128/77 | HR 88 | Temp 97.9°F | Ht 61.8 in | Wt 167.6 lb

## 2015-06-15 DIAGNOSIS — J449 Chronic obstructive pulmonary disease, unspecified: Secondary | ICD-10-CM | POA: Diagnosis not present

## 2015-06-15 DIAGNOSIS — Z720 Tobacco use: Secondary | ICD-10-CM

## 2015-06-15 DIAGNOSIS — E119 Type 2 diabetes mellitus without complications: Secondary | ICD-10-CM | POA: Diagnosis not present

## 2015-06-15 DIAGNOSIS — I119 Hypertensive heart disease without heart failure: Secondary | ICD-10-CM | POA: Diagnosis not present

## 2015-06-15 DIAGNOSIS — E785 Hyperlipidemia, unspecified: Secondary | ICD-10-CM

## 2015-06-15 DIAGNOSIS — J069 Acute upper respiratory infection, unspecified: Secondary | ICD-10-CM

## 2015-06-15 MED ORDER — BUDESONIDE-FORMOTEROL FUMARATE 160-4.5 MCG/ACT IN AERO: 2.0000 | INHALATION_SPRAY | Freq: Two times a day (BID) | RESPIRATORY_TRACT | Status: DC

## 2015-06-15 NOTE — Assessment & Plan Note (Signed)
Lipids lipemic

## 2015-06-15 NOTE — Assessment & Plan Note (Signed)
Refill Symbicort and f/u with pulmonary

## 2015-06-15 NOTE — Assessment & Plan Note (Signed)
Hgb A1C is 7.6 which is good for amount of prednisone he is getting

## 2015-06-15 NOTE — Assessment & Plan Note (Signed)
Stable, continue present medications.   

## 2015-06-15 NOTE — Progress Notes (Signed)
BP 128/77 mmHg  Pulse 88  Temp(Src) 97.9 F (36.6 C)  Ht 5' 1.8" (1.57 m)  Wt 167 lb 9.6 oz (76.023 kg)  BMI 30.84 kg/m2  SpO2 94%   Subjective:    Patient ID: John Frank, male    DOB: 1956/03/22, 60 y.o.   MRN: [id number]  HPI: John Frank is a 60 y.o. male  Chief Complaint  Patient presents with  . Diabetes  . Hypertension  . Medication Refill    pt states he needs a refill on symbicort   COPD: Regular patient of Dr. Raul Del who he plans to see in 2 months Severe.  Went to the ED 12/20 and 1/8 for COPD flares.  Gest antibiotics and Prednisone Needs a refill of Symbicort.  Has been out for 2 weeks.   Smokes 5 cigarettes/day unless someone get on his nerves.   Wife states he smokes 1 ppd  Diabetes:  Using medications without difficulties but is off and on Prednisone.  Last visit Hgb A1C was 8.7 following hospitalization with large doses of Prednisone No hypoglycemic episodes No hyperglycemic episodes Feet problems Blood Sugars averaging:not checking  Hypertension:  Using medications without difficulty Average home BPs: Not checking   Using medication without problems or lightheadedness No chest pain with exertion or shortness of breath No Edema   Elevated Cholesterol: Using medications without problems: No Muscle aches:  Diet compliance: Not good Exercise: Not exercising   Relevant past medical, surgical, family and social history reviewed and updated as indicated. Interim medical history since our last visit reviewed. Allergies and medications reviewed and updated.  Review of Systems  Per HPI unless specifically indicated above     Objective:    BP 128/77 mmHg  Pulse 88  Temp(Src) 97.9 F (36.6 C)  Ht 5' 1.8" (1.57 m)  Wt 167 lb 9.6 oz (76.023 kg)  BMI 30.84 kg/m2  SpO2 94%  Wt Readings from Last 3 Encounters:  06/15/15 167 lb 9.6 oz (76.023 kg)  05/20/15 140 lb (63.504 kg)  05/01/15 145 lb (65.772 kg)    Physical Exam   Constitutional: He is oriented to person, place, and time. He appears well-developed and well-nourished. No distress.  HENT:  Head: Normocephalic and atraumatic.  Eyes: Conjunctivae and lids are normal. Right eye exhibits no discharge. Left eye exhibits no discharge. No scleral icterus.  Neck: Normal range of motion. Neck supple. No JVD present. Carotid bruit is not present.  Cardiovascular: Normal rate, regular rhythm and normal heart sounds.   Pulmonary/Chest: Effort normal. No respiratory distress. He has decreased breath sounds in the right upper field, the right middle field, the right lower field, the left upper field, the left middle field and the left lower field.  Abdominal: Normal appearance. There is no splenomegaly or hepatomegaly.  Musculoskeletal: Normal range of motion.  Neurological: He is alert and oriented to person, place, and time.  Skin: Skin is warm, dry and intact. No rash noted. No pallor.  Psychiatric: He has a normal mood and affect. His behavior is normal. Judgment and thought content normal.     Assessment & Plan:   Problem List Items Addressed This Visit      Unprioritized   Diabetes mellitus without complication ([place])    Hgb A1C is 7.6 which is good for amount of prednisone he is getting      Relevant Orders   Bayer DCA Hb A1c Waived   Hyperlipidemia    Lipids lipemic  Relevant Orders   Lipid Panel Piccolo, Waived   COPD, severe ([place])    Refill Symbicort and f/u with pulmonary      Relevant Medications   budesonide-formoterol (SYMBICORT) 160-4.5 MCG/ACT inhaler   Hypertension, accelerated with heart disease, without CHF - Primary    Stable, continue present medications.        Relevant Orders   Comprehensive metabolic panel   Tobacco abuse    Other Visit Diagnoses    URI (upper respiratory infection)           Follow up plan: Return in about 3 months (around 09/12/2015).

## 2015-06-16 ENCOUNTER — Other Ambulatory Visit: Payer: Self-pay | Admitting: Unknown Physician Specialty

## 2015-06-16 LAB — COMPREHENSIVE METABOLIC PANEL
A/G RATIO: 1.9 (ref 1.1–2.5)
ALT: 16 IU/L (ref 0–44)
AST: 17 IU/L (ref 0–40)
Albumin: 4.2 g/dL (ref 3.6–4.8)
Alkaline Phosphatase: 107 IU/L (ref 39–117)
BUN/Creatinine Ratio: 11 (ref 10–22)
BUN: 9 mg/dL (ref 8–27)
Bilirubin Total: 0.2 mg/dL (ref 0.0–1.2)
CALCIUM: 9.4 mg/dL (ref 8.6–10.2)
CO2: 25 mmol/L (ref 18–29)
CREATININE: 0.84 mg/dL (ref 0.76–1.27)
Chloride: 96 mmol/L (ref 96–106)
GFR calc Af Amer: 110 mL/min/{1.73_m2} (ref >59)
GFR, EST NON AFRICAN AMERICAN: 95 mL/min/{1.73_m2} (ref >59)
GLUCOSE: 180 mg/dL — AB (ref 65–99)
Globulin, Total: 2.2 g/dL (ref 1.5–4.5)
POTASSIUM: 4.5 mmol/L (ref 3.5–5.2)
Sodium: 137 mmol/L (ref 134–144)
TOTAL PROTEIN: 6.4 g/dL (ref 6.0–8.5)

## 2015-06-18 LAB — LIPID PANEL PICCOLO, WAIVED

## 2015-06-18 LAB — BAYER DCA HB A1C WAIVED: HB A1C: 7.6 % — AB (ref <7.0)

## 2015-06-19 LAB — LIPID PANEL W/O CHOL/HDL RATIO
Cholesterol, Total: 188 mg/dL (ref 100–199)
HDL: 16 mg/dL — ABNORMAL LOW (ref >39)
Triglycerides: 671 mg/dL (ref 0–149)

## 2015-06-19 LAB — SPECIMEN STATUS REPORT

## 2015-06-21 ENCOUNTER — Telehealth: Payer: Self-pay

## 2015-06-21 NOTE — Telephone Encounter (Signed)
Junix from [facility] called and stated that the patient was in their office for a procedure and they needed his medication list. List was faxed to [phone], which is the fax number Junix provided to me.

## 2015-07-05 ENCOUNTER — Other Ambulatory Visit: Payer: Self-pay | Admitting: Family Medicine

## 2015-07-05 ENCOUNTER — Other Ambulatory Visit: Payer: Self-pay | Admitting: Unknown Physician Specialty

## 2015-07-10 DIAGNOSIS — I209 Angina pectoris, unspecified: Secondary | ICD-10-CM | POA: Diagnosis not present

## 2015-07-10 DIAGNOSIS — I2510 Atherosclerotic heart disease of native coronary artery without angina pectoris: Secondary | ICD-10-CM | POA: Diagnosis not present

## 2015-07-10 DIAGNOSIS — E119 Type 2 diabetes mellitus without complications: Secondary | ICD-10-CM | POA: Diagnosis not present

## 2015-07-10 DIAGNOSIS — I429 Cardiomyopathy, unspecified: Secondary | ICD-10-CM | POA: Diagnosis not present

## 2015-07-10 DIAGNOSIS — J449 Chronic obstructive pulmonary disease, unspecified: Secondary | ICD-10-CM | POA: Diagnosis not present

## 2015-07-17 ENCOUNTER — Other Ambulatory Visit: Payer: Self-pay | Admitting: Unknown Physician Specialty

## 2015-07-17 MED ORDER — GLUCOSE BLOOD VI STRP: 1.0000 | ORAL_STRIP | Freq: Every day | Status: DC

## 2015-07-17 NOTE — Telephone Encounter (Signed)
Routing to provider. Pharmacy is [facility].

## 2015-07-17 NOTE — Telephone Encounter (Signed)
Pt needs refill for one touch test strips sent to [facility].

## 2015-07-19 DIAGNOSIS — I472 Ventricular tachycardia: Secondary | ICD-10-CM | POA: Diagnosis not present

## 2015-07-24 LAB — HM DIABETES EYE EXAM

## 2015-07-25 ENCOUNTER — Emergency Department
Admission: EM | Admit: 2015-07-25 | Discharge: 2015-07-25 | Disposition: A | Discharge status: Home / Self Care | Payer: Medicare Other | Attending: Emergency Medicine | Admitting: Emergency Medicine

## 2015-07-25 ENCOUNTER — Encounter: Payer: Self-pay | Admitting: Medical Oncology

## 2015-07-25 ENCOUNTER — Emergency Department: Payer: Medicare Other

## 2015-07-25 DIAGNOSIS — Z7982 Long term (current) use of aspirin: Secondary | ICD-10-CM | POA: Insufficient documentation

## 2015-07-25 DIAGNOSIS — I119 Hypertensive heart disease without heart failure: Secondary | ICD-10-CM | POA: Diagnosis not present

## 2015-07-25 DIAGNOSIS — R05 Cough: Secondary | ICD-10-CM | POA: Diagnosis not present

## 2015-07-25 DIAGNOSIS — Z7984 Long term (current) use of oral hypoglycemic drugs: Secondary | ICD-10-CM | POA: Diagnosis not present

## 2015-07-25 DIAGNOSIS — Z950 Presence of cardiac pacemaker: Secondary | ICD-10-CM | POA: Insufficient documentation

## 2015-07-25 DIAGNOSIS — E785 Hyperlipidemia, unspecified: Secondary | ICD-10-CM | POA: Insufficient documentation

## 2015-07-25 DIAGNOSIS — I213 ST elevation (STEMI) myocardial infarction of unspecified site: Secondary | ICD-10-CM | POA: Diagnosis not present

## 2015-07-25 DIAGNOSIS — J441 Chronic obstructive pulmonary disease with (acute) exacerbation: Secondary | ICD-10-CM | POA: Diagnosis not present

## 2015-07-25 DIAGNOSIS — E119 Type 2 diabetes mellitus without complications: Secondary | ICD-10-CM | POA: Insufficient documentation

## 2015-07-25 DIAGNOSIS — F17210 Nicotine dependence, cigarettes, uncomplicated: Secondary | ICD-10-CM | POA: Insufficient documentation

## 2015-07-25 DIAGNOSIS — I2510 Atherosclerotic heart disease of native coronary artery without angina pectoris: Secondary | ICD-10-CM | POA: Diagnosis not present

## 2015-07-25 DIAGNOSIS — Z7902 Long term (current) use of antithrombotics/antiplatelets: Secondary | ICD-10-CM | POA: Diagnosis not present

## 2015-07-25 DIAGNOSIS — Z79899 Other long term (current) drug therapy: Secondary | ICD-10-CM | POA: Insufficient documentation

## 2015-07-25 DIAGNOSIS — J189 Pneumonia, unspecified organism: Secondary | ICD-10-CM | POA: Diagnosis not present

## 2015-07-25 DIAGNOSIS — R0602 Shortness of breath: Secondary | ICD-10-CM | POA: Insufficient documentation

## 2015-07-25 DIAGNOSIS — Z7951 Long term (current) use of inhaled steroids: Secondary | ICD-10-CM | POA: Diagnosis not present

## 2015-07-25 LAB — GLUCOSE, CAPILLARY: Glucose-Capillary: 165 mg/dL — ABNORMAL HIGH (ref 65–99)

## 2015-07-25 MED ORDER — LEVOFLOXACIN 500 MG PO TABS: 500.0000 mg | ORAL_TABLET | Freq: Every day | ORAL | Status: AC

## 2015-07-25 MED ORDER — IPRATROPIUM-ALBUTEROL 0.5-2.5 (3) MG/3ML IN SOLN
3.0000 mL | Freq: Once | RESPIRATORY_TRACT | Status: AC
  Administered 2015-07-25: 3 mL via RESPIRATORY_TRACT
  Filled 2015-07-25: qty 3

## 2015-07-25 NOTE — Discharge Instructions (Signed)
Community-Acquired Pneumonia, Adult Pneumonia is an infection of the lungs. One type of pneumonia can happen while a person is in a hospital. A different type can happen when a person is not in a hospital (community-acquired pneumonia). It is easy for this kind to spread from person to person. It can spread to you if you breathe near an infected person who coughs or sneezes. Some symptoms include:  A dry cough.  A wet (productive) cough.  Fever.  Sweating.  Chest pain. HOME CARE  Take over-the-counter and prescription medicines only as told by your doctor.  Only take cough medicine if you are losing sleep.  If you were prescribed an antibiotic medicine, take it as told by your doctor. Do not stop taking the antibiotic even if you start to feel better.  Sleep with your head and neck raised (elevated). You can do this by putting a few pillows under your head, or you can sleep in a recliner.  Do not use tobacco products. These include cigarettes, chewing tobacco, and e-cigarettes. If you need help quitting, ask your doctor.  Drink enough water to keep your pee (urine) clear or pale yellow. A shot (vaccine) can help prevent pneumonia. Shots are often suggested for:  People older than 61 years of age.  People older than 61 years of age:  Who are having cancer treatment.  Who have long-term (chronic) lung disease.  Who have problems with their body's defense system (immune system). You may also prevent pneumonia if you take these actions:  Get the flu (influenza) shot every year.  Go to the dentist as often as told.  Wash your hands often. If soap and water are not available, use hand sanitizer. GET HELP IF:  You have a fever.  You lose sleep because your cough medicine does not help. GET HELP RIGHT AWAY IF:  You are short of breath and it gets worse.  You have more chest pain.  Your sickness gets worse. This is very serious if:  You are an older adult.  Your  body's defense system is weak.  You cough up blood.   This information is not intended to replace advice given to you by your health care provider. Make sure you discuss any questions you have with your health care provider.   Document Released: 10/15/2007 Document Revised: 01/17/2015 Document Reviewed: 08/23/2014 Elsevier Interactive Patient Education [employer].  Please return immediately if condition worsens. Please contact her primary physician or the physician you were given for referral. If you have any specialist physicians involved in her treatment and plan please also contact them. Thank you for using [facility].

## 2015-07-25 NOTE — ED Provider Notes (Signed)
Time Seen: Approximately 10:15  I have reviewed the triage notes  Chief Complaint: Shortness of Breath and Cough   History of Present Illness: John Frank is a 60 y.o. male who presents with a history of COPD with shortness of breath and a productive cough over the past week. Patient does have home nebulizers. He states that no issues of chest pain. He states his blood sugars have been running normal at home. Off is been productive with yellow to green tinged sputum and occasionally even a grade appearance. He denies any hemoptysis. He denies any leg pain or swelling.   Past Medical History  Diagnosis Date  . Diabetes ([place])   . Heart attack ([place])   . Shortness of breath dyspnea   . Hyperlipidemia   . Hypertension   . GERD (gastroesophageal reflux disease)   . Presence of permanent cardiac pacemaker   . Sleep apnea   . COPD (chronic obstructive pulmonary disease) [facility])     Patient Active Problem List   Diagnosis Date Noted  . Tobacco abuse 04/06/2015  . COPD exacerbation ([place]) 12/24/2014  . Hypertension, accelerated with heart disease, without CHF 11/03/2014  . Sleep apnea 10/26/2014  . CAD (coronary artery disease) 10/26/2014  . GERD (gastroesophageal reflux disease) 10/26/2014  . Polyp of colon 10/26/2014  . Diabetes mellitus without complication ([place]) 88/91/6945  . Hyperlipidemia 10/26/2014  . COPD, severe ([place]) 10/26/2014  . Status cardiac pacemaker 10/26/2014  . Lesion of nasal septum 10/26/2014    Past Surgical History  Procedure Laterality Date  . Cardiac surgery    . Esophagogastroduodenoscopy N/A 09/19/2014    Procedure: ESOPHAGOGASTRODUODENOSCOPY (EGD);  Surgeon: Lucilla Lame, MD;  Location: [facility] ENDOSCOPY;  Service: Endoscopy;  Laterality: N/A;    Past Surgical History  Procedure Laterality Date  . Cardiac surgery    . Esophagogastroduodenoscopy N/A 09/19/2014    Procedure: ESOPHAGOGASTRODUODENOSCOPY (EGD);  Surgeon: Lucilla Lame, MD;  Location: [facility]  ENDOSCOPY;  Service: Endoscopy;  Laterality: N/A;    Current Outpatient Rx  Name  Route  Sig  Dispense  Refill  . aspirin EC 81 MG tablet   Oral   Take 81 mg by mouth daily.         Marland Kitchen atorvastatin (LIPITOR) 40 MG tablet   Oral   Take 1 tablet (40 mg total) by mouth daily.   90 tablet   1   . budesonide-formoterol (SYMBICORT) 160-4.5 MCG/ACT inhaler   Inhalation   Inhale 2 puffs into the lungs 2 (two) times daily.   1 Inhaler   3   . clopidogrel (PLAVIX) 75 MG tablet   Oral   Take 75 mg by mouth daily.         . enalapril (VASOTEC) 5 MG tablet      TAKE 1 TABLET (5 MG TOTAL) BY MOUTH DAILY. NEED F/U APPT FOR ADDITIONAL REFILLS   30 tablet   3   . famotidine (PEPCID) 40 MG tablet   Oral   Take 40 mg by mouth daily.         Marland Kitchen ipratropium-albuterol (DUONEB) 0.5-2.5 (3) MG/3ML SOLN   Nebulization   Take 3 mLs by nebulization every 6 (six) hours as needed (wheezing; shortness of breath).         Marland Kitchen JANUVIA 100 MG tablet      TAKE 1 TABLET (100 MG TOTAL) BY MOUTH DAILY.   90 tablet   1   . metFORMIN (GLUCOPHAGE) 1000 MG tablet   Oral  Take 1 tablet (1,000 mg total) by mouth 2 (two) times daily with a meal.   180 tablet   1   . metoprolol succinate (TOPROL-XL) 50 MG 24 hr tablet      TAKE 1 TABLET (50 MG TOTAL) BY MOUTH DAILY. NEED F/U APPT FOR ADDITIONAL REFILLS   90 tablet   0   . nitroGLYCERIN (NITROSTAT) 0.4 MG SL tablet   Sublingual   Place 0.4 mg under the tongue every 5 (five) minutes as needed for chest pain.         . pantoprazole (PROTONIX) 40 MG tablet   Oral   Take 40 mg by mouth every morning.         Marland Kitchen levofloxacin (LEVAQUIN) 500 MG tablet   Oral   Take 1 tablet (500 mg total) by mouth daily.   10 tablet   0     Allergies:  Review of patient's allergies indicates no known allergies.  Family History: Family History  Problem Relation Age of Onset  . Diabetes Mother   . Heart attack Father 4    Social History: Social  History  Substance Use Topics  . Smoking status: Current Every Day Smoker -- 0.50 packs/day for 40 years    Types: Cigarettes  . Smokeless tobacco: Former Systems developer  . Alcohol Use: No     Review of Systems:   10 point review of systems was performed and was otherwise negative:  Constitutional: No fever Eyes: No visual disturbances ENT: No sore throat, ear pain Cardiac: No chest pain Respiratory: Mild increase in his chronic shortness of breath. He is not currently on any home supplemental oxygen. He states he does have plenty of his nebulizer treatments at home. Abdomen: No abdominal pain, no vomiting, No diarrhea Endocrine: No weight loss, No night sweats Extremities: No peripheral edema, cyanosis Skin: No rashes, easy bruising Neurologic: No focal weakness, trouble with speech or swollowing Urologic: No dysuria, Hematuria, or urinary frequency   Physical Exam:  ED Triage Vitals  Enc Vitals Group     BP 07/25/15 1015 126/60 mmHg     Pulse Rate 07/25/15 1014 79     Resp 07/25/15 1014 20     Temp 07/25/15 1014 97.8 F (36.6 C)     Temp Source 07/25/15 1014 Oral     SpO2 07/25/15 1014 93 %     Weight 07/25/15 1014 154 lb (69.854 kg)     Height 07/25/15 1014 '5\' 4"'$  (1.626 m)     Head Cir --      Peak Flow --      Pain Score 07/25/15 1016 0     Pain Loc --      Pain Edu? --      Excl. in [place]? --     General: Awake , Alert , and Oriented times 3; GCS 15. No signs of respiratory distress and speaks in full and complete sentences. Head: Normal cephalic , atraumatic Eyes: Pupils equal , round, reactive to light Nose/Throat: No nasal drainage, patent upper airway without erythema or exudate.  Neck: Supple, Full range of motion, No anterior adenopathy or palpable thyroid masses Lungs: Mild and expiratory wheezing without any rhonchi or rales Heart: Regular rate, regular rhythm without murmurs , gallops , or rubs Abdomen: Soft, non tender without rebound, guarding , or rigidity;  bowel sounds positive and symmetric in all 4 quadrants. No organomegaly .        Extremities: 2 plus symmetric pulses. No edema, clubbing  or cyanosis Neurologic: normal ambulation, Motor symmetric without deficits, sensory intact Skin: warm, dry, no rashes   Labs:   All laboratory work was reviewed including any pertinent negatives or positives listed below:  Labs Reviewed  GLUCOSE, CAPILLARY - Abnormal; Notable for the following:    Glucose-Capillary 165 (*)    All other components within normal limits    Radiology:   for 1 week  EXAM: CHEST - 2 VIEW  COMPARISON: 05/20/2015, 05/01/2015, chest CT 05/01/2015  FINDINGS: Cardiomediastinal silhouette unchanged in size and contour. No evidence of pulmonary vascular congestion.  No pneumothorax. No pleural effusion.  Stigmata of emphysema, with increased retrosternal airspace, flattened hemidiaphragms, increased AP diameter, and hyperinflation on the AP view.  Ill-defined opacity at the medial right base.  Left chest wall AICD with 2 leads in pace.  Degenerative changes of the spine.  Unremarkable appearance of the upper abdomen.  IMPRESSION: Ill-defined opacity at the medial right base, concerning for right middle lobe infection.  Changes of emphysema  Signed,  Dulcy Fanny. Earleen Newport, DO  [facility]  [facility]   Electronically Signed By: Corrie Mckusick D.O.    I personally reviewed the radiologic studies     ED Course: The patient presents with a history of COPD and a productive cough. He is currently afebrile, normal blood sugar, and no signs of hypoxia. He has some mild and expiratory wheezing. Patient was given a DuoNeb with symptomatic improvement. His chest x-ray shows indications of early community-acquired pneumonia and will be established on outpatient antibiotics. I felt since he was not hypoxic and otherwise had normal supplies at home they will do  well on outpatient basis with prescription of Levaquin. The patient was advised drink plenty of fluids and return here if he develops any persistent chest pain, increasing fever and productive cough or any other new concerns.    Assessment:  Community-acquired pneumonia History of COPD   Final Clinical Impression:   Final diagnoses:  Community acquired pneumonia     Plan: * Outpatient management Patient was advised to return immediately if condition worsens. Patient was advised to follow up with their primary care physician or other specialized physicians involved in their outpatient care. The patient and/or family member/power of attorney had laboratory results reviewed at the bedside. All questions and concerns were addressed and appropriate discharge instructions were distributed by the nursing staff.            Daymon Larsen, MD 07/25/15 [phone]

## 2015-07-25 NOTE — ED Notes (Signed)
Pt reports hx of copd and states that he has been having sob and productive cough x 1 week.

## 2015-09-09 ENCOUNTER — Other Ambulatory Visit: Payer: Self-pay | Admitting: Unknown Physician Specialty

## 2015-09-14 ENCOUNTER — Ambulatory Visit: Payer: Medicare Other | Admitting: Unknown Physician Specialty

## 2015-09-18 ENCOUNTER — Ambulatory Visit: Payer: Medicare Other | Admitting: Unknown Physician Specialty

## 2015-09-24 ENCOUNTER — Encounter: Payer: Self-pay | Admitting: Unknown Physician Specialty

## 2015-09-24 ENCOUNTER — Ambulatory Visit (INDEPENDENT_AMBULATORY_CARE_PROVIDER_SITE_OTHER): Payer: Medicare Other | Admitting: Unknown Physician Specialty

## 2015-09-24 VITALS — BP 135/79 | HR 79 | Temp 97.8°F | Ht 61.9 in | Wt 159.0 lb

## 2015-09-24 DIAGNOSIS — Z0000 Encounter for general adult medical examination without abnormal findings: Secondary | ICD-10-CM

## 2015-09-24 DIAGNOSIS — I119 Hypertensive heart disease without heart failure: Secondary | ICD-10-CM

## 2015-09-24 DIAGNOSIS — E119 Type 2 diabetes mellitus without complications: Secondary | ICD-10-CM

## 2015-09-24 DIAGNOSIS — R55 Syncope and collapse: Secondary | ICD-10-CM

## 2015-09-24 DIAGNOSIS — Z720 Tobacco use: Secondary | ICD-10-CM

## 2015-09-24 DIAGNOSIS — J449 Chronic obstructive pulmonary disease, unspecified: Secondary | ICD-10-CM | POA: Diagnosis not present

## 2015-09-24 LAB — BAYER DCA HB A1C WAIVED: HB A1C (BAYER DCA - WAIVED): 7.2 % — ABNORMAL HIGH (ref <7.0)

## 2015-09-24 NOTE — Assessment & Plan Note (Signed)
Steady improvement.  7.2 today.  Recheck in 3 months

## 2015-09-24 NOTE — Assessment & Plan Note (Signed)
Taking inhalers as directed.  Has a nebulizer.  Encouraged to quit smoking

## 2015-09-24 NOTE — Assessment & Plan Note (Signed)
Stable, continue present medications.   

## 2015-09-24 NOTE — Progress Notes (Signed)
BP 135/79 mmHg  Pulse 79  Temp(Src) 97.8 F (36.6 C)  Ht 5' 1.9" (1.572 m)  Wt 159 lb (72.122 kg)  BMI 29.19 kg/m2  SpO2 95%   Subjective:    Patient ID: John Frank, male    DOB: 1955/08/23, 60 y.o.   MRN: [id number]  HPI: John Frank is a 60 y.o. male  Chief Complaint  Patient presents with  . Diabetes  . Hyperlipidemia  . Hypertension  . other    pt states that when he gets up sometimes he has to catch himself so he does not fall down  . Labs Only    Hep C and HIV orders entered   Diabetes:  Using medications without difficulties Hypoglycemic episodes Hyperglycemic episodes Feet problems: none Blood Sugars averaging: 126-137 Eye exam within last year: 2 weeks ago Last A1c: 7.6  Hypertension:    Using medication without problems or lightheadedness  No chest pain with exertion No edema No shortness of breath   Elevated Cholesterol: Using medications without problems: No muscle aches  Diet compliance: Exercise: walks daily   COPD Feels symptoms are well controlled Using medications without problems Night time symptoms: none ER visits since last visit: none Increased cough: none Increased SOB: none  Fainting spells States for the last 3-4 years, he can be "sitting" there and a "funny feeling comes over me" and I "hit the floor."  He does black out.  This is witnessed and told he looks like he is having a seizure.    If he is standing he has to hold onto something.  It sounds like he is afraid he is having a seizure and hasn't told anyone.    Relevant past medical, surgical, family and social history reviewed and updated as indicated. Interim medical history since our last visit reviewed. Allergies and medications reviewed and updated.  Review of Systems  Per HPI unless specifically indicated above     Objective:    BP 135/79 mmHg  Pulse 79  Temp(Src) 97.8 F (36.6 C)  Ht 5' 1.9" (1.572 m)  Wt 159 lb (72.122 kg)  BMI 29.19 kg/m2  SpO2  95%  Wt Readings from Last 3 Encounters:  09/24/15 159 lb (72.122 kg)  07/25/15 154 lb (69.854 kg)  06/15/15 167 lb 9.6 oz (76.023 kg)    Physical Exam  Constitutional: He is oriented to person, place, and time. He appears well-developed and well-nourished. No distress.  HENT:  Head: Normocephalic and atraumatic.  Eyes: Conjunctivae and lids are normal. Right eye exhibits no discharge. Left eye exhibits no discharge. No scleral icterus.  Neck: Normal range of motion. Neck supple. No JVD present. Carotid bruit is not present.  Cardiovascular: Normal rate, regular rhythm and normal heart sounds.   Pulmonary/Chest: Effort normal. No respiratory distress. He has decreased breath sounds. He has wheezes.  Decreased BS and wheezing throughout-  Abdominal: Normal appearance. There is no splenomegaly or hepatomegaly.  Musculoskeletal: Normal range of motion.  Neurological: He is alert and oriented to person, place, and time.  Skin: Skin is warm, dry and intact. No rash noted. No pallor.  Psychiatric: He has a normal mood and affect. His behavior is normal. Judgment and thought content normal.    Results for orders placed or performed in visit on 07/26/15  HM DIABETES EYE EXAM  Result Value Ref Range   HM Diabetic Eye Exam No Retinopathy No Retinopathy      Assessment & Plan:   Problem List  Items Addressed This Visit      Unprioritized   COPD, severe ([place])    Taking inhalers as directed.  Has a nebulizer.  Encouraged to quit smoking      Diabetes mellitus without complication ([facility])    Steady improvement.  7.2 today.  Recheck in 3 months      Relevant Orders   Bayer DCA Hb A1c Waived   Comprehensive metabolic panel   Ambulatory referral to Podiatry   Hypertension, accelerated with heart disease, without CHF    Stable, continue present medications.        Tobacco abuse    Other Visit Diagnoses    Health care maintenance    -  Primary    Relevant Orders    Hepatitis C  antibody    HIV antibody    Syncope and collapse        Refer to neurology for further evaluation.  Encouraged not to drive    Relevant Orders    Ambulatory referral to Neurology        Follow up plan: No Follow-up on file.

## 2015-09-25 LAB — COMPREHENSIVE METABOLIC PANEL
A/G RATIO: 2 (ref 1.2–2.2)
ALT: 17 IU/L (ref 0–44)
AST: 15 IU/L (ref 0–40)
Albumin: 4.5 g/dL (ref 3.6–4.8)
Alkaline Phosphatase: 110 IU/L (ref 39–117)
BILIRUBIN TOTAL: 0.4 mg/dL (ref 0.0–1.2)
BUN / CREAT RATIO: 11 (ref 10–24)
BUN: 9 mg/dL (ref 8–27)
CO2: 25 mmol/L (ref 18–29)
CREATININE: 0.81 mg/dL (ref 0.76–1.27)
Calcium: 9.5 mg/dL (ref 8.6–10.2)
Chloride: 96 mmol/L (ref 96–106)
GFR calc Af Amer: 112 mL/min/{1.73_m2} (ref >59)
GFR, EST NON AFRICAN AMERICAN: 97 mL/min/{1.73_m2} (ref >59)
GLOBULIN, TOTAL: 2.3 g/dL (ref 1.5–4.5)
Glucose: 99 mg/dL (ref 65–99)
Potassium: 4.5 mmol/L (ref 3.5–5.2)
SODIUM: 138 mmol/L (ref 134–144)
TOTAL PROTEIN: 6.8 g/dL (ref 6.0–8.5)

## 2015-09-25 LAB — HEPATITIS C ANTIBODY

## 2015-09-25 LAB — HIV ANTIBODY (ROUTINE TESTING W REFLEX): HIV SCREEN 4TH GENERATION: NONREACTIVE

## 2015-09-26 DIAGNOSIS — R0602 Shortness of breath: Secondary | ICD-10-CM | POA: Diagnosis not present

## 2015-09-26 DIAGNOSIS — G4733 Obstructive sleep apnea (adult) (pediatric): Secondary | ICD-10-CM | POA: Diagnosis not present

## 2015-09-26 DIAGNOSIS — J449 Chronic obstructive pulmonary disease, unspecified: Secondary | ICD-10-CM | POA: Diagnosis not present

## 2015-10-16 ENCOUNTER — Ambulatory Visit: Payer: Medicare Other | Admitting: Podiatry

## 2015-10-18 ENCOUNTER — Ambulatory Visit: Payer: Medicare Other | Admitting: Podiatry

## 2015-10-30 ENCOUNTER — Ambulatory Visit (INDEPENDENT_AMBULATORY_CARE_PROVIDER_SITE_OTHER): Payer: Medicare Other | Admitting: Podiatry

## 2015-10-30 ENCOUNTER — Encounter: Payer: Self-pay | Admitting: Podiatry

## 2015-10-30 ENCOUNTER — Telehealth: Payer: Self-pay | Admitting: *Deleted

## 2015-10-30 VITALS — BP 128/80 | HR 87 | Resp 18

## 2015-10-30 DIAGNOSIS — B351 Tinea unguium: Secondary | ICD-10-CM

## 2015-10-30 DIAGNOSIS — M79676 Pain in unspecified toe(s): Secondary | ICD-10-CM | POA: Diagnosis not present

## 2015-10-30 DIAGNOSIS — E1149 Type 2 diabetes mellitus with other diabetic neurological complication: Secondary | ICD-10-CM | POA: Diagnosis not present

## 2015-10-30 DIAGNOSIS — R252 Cramp and spasm: Secondary | ICD-10-CM

## 2015-10-30 NOTE — Patient Instructions (Signed)

## 2015-10-30 NOTE — Telephone Encounter (Signed)
Pt called left name and phone number only. Pt states he made an appt.

## 2015-10-30 NOTE — Progress Notes (Signed)
   Subjective:    Patient ID: John Frank, male    DOB: November 10, 1955, 60 y.o.   MRN: [id number]  HPI  60 year old male presents the office for concerns of thick, painful, elongated toenails that he cannot trim himself. Denies any swelling redness or drainage. He did try trimming his toenails and his right third and fourth toes that he did cut the toes. He has a states he gets some tightness in his feet when he walks however he is only having pain at times and he walks and he is calling not experiencing any pain.  Review of Systems  All other systems reviewed and are negative.      Objective:   Physical Exam General: AAO x3, NAD  Dermatological: aNls appear to be hypertrophic, dystrophic, brittle, discolored, elongated 8. The right third and fourth digits or nails previously been cut and there are superficial abrasions where he tried to trim the toenails himself. There is no edema, erythema or signs of infection. There is tenderness to remain toenails. No swelling redness or drainage. No open lesions identified.  Vascular: Dorsalis Pedis artery and Posterior Tibial artery pedal pulses are 2/4 bilateral with immedate capillary fill time. Pedal hair growth present. There is no pain with calf compression, swelling, warmth, erythema.   Neruologic: Decreased sensation with SWMF.  Musculoskeletal:there is no areas of tenderness to bilateral feet. He states that when he does a lot of walking he gets cramps to his toes No gross boney pedal deformities bilateral. No pain, crepitus, or limitation noted with foot and ankle range of motion bilateral. Muscular strength 5/5 in all groups tested bilateral.  Gait: Unassisted, Nonantalgic.      Assessment & Plan:  60 year old male with symptomatic onychomycosis, foot tightness. -Treatment options discussed including all alternatives, risks, and complications -Etiology of symptoms were discussed -Nails debrided 10 without complications or  bleeding. -Daily foot inspection -discussed stretching exercises to help with cramping. -Follow-up in 3 months or sooner if any problems arise. In the meantime, encouraged to call the office with any questions, concerns, change in symptoms.   Celesta Gentile, DPM

## 2015-10-31 ENCOUNTER — Encounter: Payer: Self-pay | Admitting: Podiatry

## 2015-11-06 DIAGNOSIS — I472 Ventricular tachycardia: Secondary | ICD-10-CM | POA: Diagnosis not present

## 2015-11-07 ENCOUNTER — Other Ambulatory Visit: Payer: Self-pay | Admitting: Family Medicine

## 2015-11-07 NOTE — Telephone Encounter (Signed)
Your patient 

## 2015-11-27 ENCOUNTER — Ambulatory Visit: Payer: Self-pay | Admitting: Neurology

## 2015-12-18 ENCOUNTER — Emergency Department
Admission: EM | Admit: 2015-12-18 | Discharge: 2015-12-18 | Disposition: A | Discharge status: Home / Self Care | Payer: Medicare Other | Attending: Student in an Organized Health Care Education/Training Program | Admitting: Student in an Organized Health Care Education/Training Program

## 2015-12-18 ENCOUNTER — Encounter: Payer: Self-pay | Admitting: Emergency Medicine

## 2015-12-18 ENCOUNTER — Emergency Department: Payer: Medicare Other

## 2015-12-18 DIAGNOSIS — E119 Type 2 diabetes mellitus without complications: Secondary | ICD-10-CM | POA: Insufficient documentation

## 2015-12-18 DIAGNOSIS — J209 Acute bronchitis, unspecified: Secondary | ICD-10-CM

## 2015-12-18 DIAGNOSIS — I2510 Atherosclerotic heart disease of native coronary artery without angina pectoris: Secondary | ICD-10-CM | POA: Diagnosis not present

## 2015-12-18 DIAGNOSIS — Z7984 Long term (current) use of oral hypoglycemic drugs: Secondary | ICD-10-CM | POA: Insufficient documentation

## 2015-12-18 DIAGNOSIS — R079 Chest pain, unspecified: Secondary | ICD-10-CM | POA: Diagnosis not present

## 2015-12-18 DIAGNOSIS — J441 Chronic obstructive pulmonary disease with (acute) exacerbation: Secondary | ICD-10-CM | POA: Insufficient documentation

## 2015-12-18 DIAGNOSIS — R05 Cough: Secondary | ICD-10-CM | POA: Diagnosis not present

## 2015-12-18 DIAGNOSIS — F17210 Nicotine dependence, cigarettes, uncomplicated: Secondary | ICD-10-CM | POA: Insufficient documentation

## 2015-12-18 DIAGNOSIS — I10 Essential (primary) hypertension: Secondary | ICD-10-CM | POA: Insufficient documentation

## 2015-12-18 DIAGNOSIS — J440 Chronic obstructive pulmonary disease with acute lower respiratory infection: Secondary | ICD-10-CM | POA: Diagnosis not present

## 2015-12-18 LAB — CBC
HCT: 44.1 % (ref 40.0–52.0)
Hemoglobin: 14.6 g/dL (ref 13.0–18.0)
MCH: 29.9 pg (ref 26.0–34.0)
MCHC: 33 g/dL (ref 32.0–36.0)
MCV: 90.7 fL (ref 80.0–100.0)
PLATELETS: 170 10*3/uL (ref 150–440)
RBC: 4.87 MIL/uL (ref 4.40–5.90)
RDW: 16.5 % — AB (ref 11.5–14.5)
WBC: 6.6 10*3/uL (ref 3.8–10.6)

## 2015-12-18 LAB — BASIC METABOLIC PANEL
Anion gap: 5 (ref 5–15)
BUN: 7 mg/dL (ref 6–20)
CHLORIDE: 104 mmol/L (ref 101–111)
CO2: 29 mmol/L (ref 22–32)
CREATININE: 0.94 mg/dL (ref 0.61–1.24)
Calcium: 9 mg/dL (ref 8.9–10.3)
GFR calc Af Amer: >60 mL/min (ref >60)
GFR calc non Af Amer: >60 mL/min (ref >60)
GLUCOSE: 174 mg/dL — AB (ref 65–99)
Potassium: 4 mmol/L (ref 3.5–5.1)
Sodium: 138 mmol/L (ref 135–145)

## 2015-12-18 LAB — MAGNESIUM: Magnesium: 1.9 mg/dL (ref 1.7–2.4)

## 2015-12-18 LAB — TROPONIN I: Troponin I: <0.03 ng/mL (ref <0.03)

## 2015-12-18 MED ORDER — IPRATROPIUM BROMIDE 0.02 % IN SOLN
0.5000 mg | Freq: Once | RESPIRATORY_TRACT | Status: AC
  Administered 2015-12-18: 0.5 mg via RESPIRATORY_TRACT
  Filled 2015-12-18: qty 2.5

## 2015-12-18 MED ORDER — ALBUTEROL SULFATE (2.5 MG/3ML) 0.083% IN NEBU
10.0000 mg/h | INHALATION_SOLUTION | RESPIRATORY_TRACT | Status: DC
  Administered 2015-12-18: 10 mg/h via RESPIRATORY_TRACT
  Filled 2015-12-18: qty 3

## 2015-12-18 MED ORDER — DOXYCYCLINE HYCLATE 50 MG PO CAPS: 100.0000 mg | ORAL_CAPSULE | Freq: Two times a day (BID) | ORAL | 0 refills | Status: AC

## 2015-12-18 MED ORDER — METHYLPREDNISOLONE SODIUM SUCC 125 MG IJ SOLR
125.0000 mg | Freq: Once | INTRAMUSCULAR | Status: AC
  Administered 2015-12-18: 125 mg via INTRAVENOUS
  Filled 2015-12-18: qty 2

## 2015-12-18 MED ORDER — DOXYCYCLINE HYCLATE 100 MG PO TABS
100.0000 mg | ORAL_TABLET | Freq: Once | ORAL | Status: AC
  Administered 2015-12-18: 100 mg via ORAL
  Filled 2015-12-18: qty 1

## 2015-12-18 MED ORDER — IPRATROPIUM-ALBUTEROL 0.5-2.5 (3) MG/3ML IN SOLN
3.0000 mL | Freq: Once | RESPIRATORY_TRACT | Status: AC
  Administered 2015-12-18: 3 mL via RESPIRATORY_TRACT
  Filled 2015-12-18: qty 3

## 2015-12-18 MED ORDER — DOXYCYCLINE HYCLATE 100 MG PO TABS
ORAL_TABLET | ORAL | Status: AC
  Filled 2015-12-18: qty 1

## 2015-12-18 MED ORDER — PREDNISONE 20 MG PO TABS: 60.0000 mg | ORAL_TABLET | Freq: Every day | ORAL | 0 refills | Status: AC

## 2015-12-18 NOTE — ED Provider Notes (Signed)
[facility] Emergency Department Provider Note    First MD Initiated Contact with Patient 12/18/15 1857     (approximate)  I have reviewed the triage vital signs and the nursing notes.   HISTORY  Chief Complaint Cough    HPI John Frank is a 60 y.o. male with history of COPD not on home oxygen presents with 2 days of worsening shortness of breath and productive cough. Patient states that he is still smoking half pack a day but has cut down from 1 pack per day.  States she's been compliant with his home blood pressure medications. Denies any recent fevers but does admit to productive cough.. Patient has significant shortness of breath with ambulation. Denies any chest pain at this time.   Past Medical History:  Diagnosis Date  . COPD (chronic obstructive pulmonary disease) ([place])   . Diabetes ([place])   . GERD (gastroesophageal reflux disease)   . Heart attack ([place])   . Hyperlipidemia   . Hypertension   . Presence of permanent cardiac pacemaker   . Shortness of breath dyspnea   . Sleep apnea     Patient Active Problem List   Diagnosis Date Noted  . Tobacco abuse 04/06/2015  . COPD exacerbation ([place]) 12/24/2014  . Hypertension, accelerated with heart disease, without CHF 11/03/2014  . Sleep apnea 10/26/2014  . CAD (coronary artery disease) 10/26/2014  . GERD (gastroesophageal reflux disease) 10/26/2014  . Polyp of colon 10/26/2014  . Diabetes mellitus without complication ([place]) 85/27/7824  . Hyperlipidemia 10/26/2014  . COPD, severe ([place]) 10/26/2014  . Status cardiac pacemaker 10/26/2014  . Lesion of nasal septum 10/26/2014    Past Surgical History:  Procedure Laterality Date  . CARDIAC SURGERY    . ESOPHAGOGASTRODUODENOSCOPY N/A 09/19/2014   Procedure: ESOPHAGOGASTRODUODENOSCOPY (EGD);  Surgeon: Lucilla Lame, MD;  Location: [facility] ENDOSCOPY;  Service: Endoscopy;  Laterality: N/A;    Prior to Admission medications   Medication Sig Start Date  End Date Taking? Authorizing Provider  aspirin EC 81 MG tablet Take 81 mg by mouth daily.    Historical Provider, MD  atorvastatin (LIPITOR) 40 MG tablet TAKE 1 TABLET (40 MG TOTAL) BY MOUTH DAILY. 09/10/15   Kathrine Haddock, NP  budesonide-formoterol [facility]) 160-4.5 MCG/ACT inhaler Inhale 2 puffs into the lungs 2 (two) times daily. 06/15/15   Kathrine Haddock, NP  clopidogrel (PLAVIX) 75 MG tablet Take 75 mg by mouth daily.    Historical Provider, MD  enalapril (VASOTEC) 5 MG tablet TAKE 1 TABLET (5 MG TOTAL) BY MOUTH DAILY. NEED F/U APPT FOR ADDITIONAL REFILLS 11/07/15   Kathrine Haddock, NP  famotidine (PEPCID) 40 MG tablet TAKE ONE TABLET BY MOUTH ONCE DAILY 09/10/15   Kathrine Haddock, NP  ipratropium-albuterol (DUONEB) 0.5-2.5 (3) MG/3ML SOLN Take 3 mLs by nebulization every 6 (six) hours as needed (wheezing; shortness of breath).    Historical Provider, MD  JANUVIA 100 MG tablet TAKE 1 TABLET (100 MG TOTAL) BY MOUTH DAILY. 06/18/15   Kathrine Haddock, NP  metFORMIN (GLUCOPHAGE) 1000 MG tablet TAKE 1 TABLET (1,000 MG TOTAL) BY MOUTH 2 (TWO) TIMES DAILY WITH A MEAL. 09/10/15   Kathrine Haddock, NP  metoprolol succinate (TOPROL-XL) 50 MG 24 hr tablet TAKE 1 TABLET (50 MG TOTAL) BY MOUTH DAILY. NEED F/U APPT FOR ADDITIONAL REFILLS 09/10/15   Kathrine Haddock, NP  nitroGLYCERIN (NITROSTAT) 0.4 MG SL tablet Place 0.4 mg under the tongue every 5 (five) minutes as needed for chest pain.    Historical  Provider, MD  ONE TOUCH ULTRA TEST test strip USE TO TEST BLOOD SUGAR ONCE DAILY 07/18/15   Historical Provider, MD  pantoprazole (PROTONIX) 40 MG tablet Take 40 mg by mouth every morning.    Historical Provider, MD    Allergies Review of patient's allergies indicates no known allergies.  Family History  Problem Relation Age of Onset  . Diabetes Mother   . Heart attack Father 60    Social History Social History  Substance Use Topics  . Smoking status: Current Every Day Smoker    Packs/day: 0.25    Years: 40.00    Types:  Cigarettes  . Smokeless tobacco: Former Systems developer  . Alcohol use No    Review of Systems Patient denies headaches, rhinorrhea, blurry vision, numbness, shortness of breath, chest pain, edema, cough, abdominal pain, nausea, vomiting, diarrhea, dysuria, fevers, rashes or hallucinations unless otherwise stated above in HPI. ____________________________________________   PHYSICAL EXAM:  VITAL SIGNS: Vitals:   12/18/15 1930 12/18/15 2000  BP: 133/71 128/68  Pulse: 91 90  Resp:    Temp:      Constitutional: Alert and oriented. Moderate respiratory distress Eyes: Conjunctivae are normal. PERRL. EOMI. Head: Atraumatic. Nose: No congestion/rhinnorhea. Mouth/Throat: Mucous membranes are moist.  Oropharynx non-erythematous. Neck: No stridor. Painless ROM. No cervical spine tenderness to palpation Hematological/Lymphatic/Immunilogical: No cervical lymphadenopathy. Cardiovascular: Normal rate, regular rhythm. Grossly normal heart sounds.  Good peripheral circulation. Respiratory: tachypneic, prolonged expiratory phase, pursed lip breathing, no inspiratory crackles or rhonchi. Gastrointestinal: Soft and nontender. No distention. No abdominal bruits. No CVA tenderness. Genitourinary:  Musculoskeletal: No lower extremity tenderness nor edema.  No joint effusions. Neurologic:  Normal speech and language. No gross focal neurologic deficits are appreciated. No gait instability. Skin:  Skin is warm, dry and intact. No rash noted. Psychiatric: Mood and affect are normal. Speech and behavior are normal.  ____________________________________________   LABS (all labs ordered are listed, but only abnormal results are displayed)  Results for orders placed or performed during the hospital encounter of 12/18/15 (from the past 24 hour(s))  Basic metabolic panel     Status: Abnormal   Collection Time: 12/18/15  4:22 PM  Result Value Ref Range   Sodium 138 135 - 145 mmol/L   Potassium 4.0 3.5 - 5.1 mmol/L    Chloride 104 101 - 111 mmol/L   CO2 29 22 - 32 mmol/L   Glucose, Bld 174 (H) 65 - 99 mg/dL   BUN 7 6 - 20 mg/dL   Creatinine, Ser 0.94 0.61 - 1.24 mg/dL   Calcium 9.0 8.9 - 10.3 mg/dL   GFR calc non Af Amer >60 >60 mL/min   GFR calc Af Amer >60 >60 mL/min   Anion gap 5 5 - 15  CBC     Status: Abnormal   Collection Time: 12/18/15  4:22 PM  Result Value Ref Range   WBC 6.6 3.8 - 10.6 K/uL   RBC 4.87 4.40 - 5.90 MIL/uL   Hemoglobin 14.6 13.0 - 18.0 g/dL   HCT 44.1 40.0 - 52.0 %   MCV 90.7 80.0 - 100.0 fL   MCH 29.9 26.0 - 34.0 pg   MCHC 33.0 32.0 - 36.0 g/dL   RDW 16.5 (H) 11.5 - 14.5 %   Platelets 170 150 - 440 K/uL  Troponin I     Status: None   Collection Time: 12/18/15  4:22 PM  Result Value Ref Range   Troponin I <0.03 <0.03 ng/mL  Magnesium  Status: None   Collection Time: 12/18/15  4:22 PM  Result Value Ref Range   Magnesium 1.9 1.7 - 2.4 mg/dL   ____________________________________________  EKG  Time: 16:20  Indication: sob  Rate: 75  Rhythm: NSR Axis: normal, no acute ischemic changes  ____________________________________________  RADIOLOGY  CXR '@times'$ @ my read shows no evidence of acute cardiopulmonary process.  ____________________________________________   PROCEDURES  Procedure(s) performed: none    Critical Care performed: no ____________________________________________   INITIAL IMPRESSION / ASSESSMENT AND PLAN / ED COURSE  Pertinent labs & imaging results that were available during my care of the patient were reviewed by me and considered in my medical decision making (see chart for details).  DDX: COPD, CHF, ACS, pneumonia, chronic bronchitis   LINDA BIEHN is a 60 y.o. who presents to the ED with 2 days of progressively worsening cough and shortness of breath. Patient is a significant smoker with a history of COPD. Chest x-ray ordered out of triage due to concern for cough and shortness of breath shows hyper inflated lungs. There is  no evidence of pneumonia or congestive heart failure. Laboratory evaluation ordered in triage due to concern for shortness of breath shows no evidence of acute infection or renal dysfunction.  Patient's lung exam is more consistent with acute rhonchi colitis and COPD exacerbation given his prolonged expiratory phase. He does not have any significant hypoxia the patient was ordered hour-long nebulizer treatment with significant improvement symptoms. His EKG is without signs of acute ischemia and his troponin is negative. Do not feel his symptoms are consistent with PE as the patient had significant improvement after nebulizer treatment and steroids. After this treatment the patient was able to ambulate without become hypoxic. He does have access to nebulizers at home. Due to his increased cough and severe COPD I will start patient on doxycycline for COPD exacerbation as well as our prescription for by mouth steroids.  Have discussed with the patient and available family all diagnostics and treatments performed thus far and all questions were answered to the best of my ability. The patient demonstrates understanding and agreement with plan.   Clinical Course     ____________________________________________   FINAL CLINICAL IMPRESSION(S) / ED DIAGNOSES  Final diagnoses:  Acute bronchitis, unspecified organism  COPD with acute exacerbation ([place])      NEW MEDICATIONS STARTED DURING THIS VISIT:  New Prescriptions   No medications on file     Note:  This document was prepared using Dragon voice recognition software and may include unintentional dictation errors.    Merlyn Lot, MD 12/18/15 2113

## 2015-12-18 NOTE — ED Notes (Signed)
Patient ambulated in hallway. O2 was 92% with HR of 98

## 2015-12-19 ENCOUNTER — Other Ambulatory Visit: Payer: Self-pay | Admitting: Unknown Physician Specialty

## 2016-01-01 ENCOUNTER — Ambulatory Visit (INDEPENDENT_AMBULATORY_CARE_PROVIDER_SITE_OTHER): Payer: Medicare Other | Admitting: Unknown Physician Specialty

## 2016-01-01 ENCOUNTER — Encounter: Payer: Self-pay | Admitting: Unknown Physician Specialty

## 2016-01-01 ENCOUNTER — Other Ambulatory Visit: Payer: Self-pay

## 2016-01-01 VITALS — BP 112/73 | HR 81 | Temp 97.5°F | Ht 63.0 in | Wt 160.4 lb

## 2016-01-01 DIAGNOSIS — E119 Type 2 diabetes mellitus without complications: Secondary | ICD-10-CM

## 2016-01-01 DIAGNOSIS — E669 Obesity, unspecified: Secondary | ICD-10-CM | POA: Diagnosis not present

## 2016-01-01 DIAGNOSIS — I119 Hypertensive heart disease without heart failure: Secondary | ICD-10-CM

## 2016-01-01 DIAGNOSIS — E785 Hyperlipidemia, unspecified: Secondary | ICD-10-CM

## 2016-01-01 DIAGNOSIS — I2510 Atherosclerotic heart disease of native coronary artery without angina pectoris: Secondary | ICD-10-CM | POA: Diagnosis not present

## 2016-01-01 LAB — BAYER DCA HB A1C WAIVED: HB A1C (BAYER DCA - WAIVED): 7.6 % — ABNORMAL HIGH (ref <7.0)

## 2016-01-01 LAB — HEMOGLOBIN A1C

## 2016-01-01 MED ORDER — METOPROLOL SUCCINATE ER 50 MG PO TB24: 50.0000 mg | ORAL_TABLET | Freq: Every day | ORAL | 1 refills | Status: DC

## 2016-01-01 MED ORDER — CLOPIDOGREL BISULFATE 75 MG PO TABS: 75.0000 mg | ORAL_TABLET | Freq: Every day | ORAL | 3 refills | Status: DC

## 2016-01-01 MED ORDER — PANTOPRAZOLE SODIUM 40 MG PO TBEC: 40.0000 mg | DELAYED_RELEASE_TABLET | ORAL | 1 refills | Status: DC

## 2016-01-01 NOTE — Assessment & Plan Note (Addendum)
Hgb A1C is 7.6.  Admits to a lot of sugar in his coffee plus Coke, Pepsi, and tea.  He agrees to limit these drinks.

## 2016-01-01 NOTE — Assessment & Plan Note (Signed)
Refill Plavix

## 2016-01-01 NOTE — Progress Notes (Signed)
BP 112/73 (BP Location: Left Arm, Patient Position: Sitting, Cuff Size: Large)   Pulse 81   Temp 97.5 F (36.4 C)   Ht '5\' 3"'$  (1.6 m)   Wt 160 lb 6.4 oz (72.8 kg)   SpO2 96%   BMI 28.41 kg/m    Subjective:    Patient ID: John Frank, male    DOB: Oct 01, 1955, 60 y.o.   MRN: [id number]  HPI: John Frank is a 60 y.o. male  Chief Complaint  Patient presents with  . Diabetes  . Hyperlipidemia  . Hypertension   Diabetes: Using medications without difficulties No hypoglycemic episodes No hyperglycemic episodes Feet problems: none Blood Sugars averaging: about 132 eye exam within last year yes Last Hgb A1C: 7.2  Hypertension  Using medications without difficulty Average home BPs SBP 132  Using medication without problems or lightheadedness No chest pain with exertion or shortness of breath No Edema  Elevated Cholesterol Using medications without problems No Muscle aches  Diet: Watches what he eats and cuts back on fried foods Exercise: walks daily  CAD Sees Dr Clayborn Bigness but out of Plavix he is on due to stent  COPD Went to the ER due to bronchitis.  Doing better.       Relevant past medical, surgical, family and social history reviewed and updated as indicated. Interim medical history since our last visit reviewed. Allergies and medications reviewed and updated.  Review of Systems  Per HPI unless specifically indicated above     Objective:    BP 112/73 (BP Location: Left Arm, Patient Position: Sitting, Cuff Size: Large)   Pulse 81   Temp 97.5 F (36.4 C)   Ht '5\' 3"'$  (1.6 m)   Wt 160 lb 6.4 oz (72.8 kg)   SpO2 96%   BMI 28.41 kg/m   Wt Readings from Last 3 Encounters:  01/01/16 160 lb 6.4 oz (72.8 kg)  12/18/15 174 lb (78.9 kg)  09/24/15 159 lb (72.1 kg)    Physical Exam  Constitutional: He is oriented to person, place, and time. He appears well-developed and well-nourished. No distress.  HENT:  Head: Normocephalic and atraumatic.  Eyes:  Conjunctivae and lids are normal. Right eye exhibits no discharge. Left eye exhibits no discharge. No scleral icterus.  Neck: Normal range of motion. Neck supple. No JVD present. Carotid bruit is not present.  Cardiovascular: Normal rate, regular rhythm and normal heart sounds.   Pulmonary/Chest: Effort normal and breath sounds normal. No respiratory distress.  Abdominal: Normal appearance. There is no splenomegaly or hepatomegaly.  Musculoskeletal: Normal range of motion.  Neurological: He is alert and oriented to person, place, and time.  Skin: Skin is warm, dry and intact. No rash noted. No pallor.  Psychiatric: He has a normal mood and affect. His behavior is normal. Judgment and thought content normal.    Results for orders placed or performed during the hospital encounter of 84/13/24  Basic metabolic panel  Result Value Ref Range   Sodium 138 135 - 145 mmol/L   Potassium 4.0 3.5 - 5.1 mmol/L   Chloride 104 101 - 111 mmol/L   CO2 29 22 - 32 mmol/L   Glucose, Bld 174 (H) 65 - 99 mg/dL   BUN 7 6 - 20 mg/dL   Creatinine, Ser 0.94 0.61 - 1.24 mg/dL   Calcium 9.0 8.9 - 10.3 mg/dL   GFR calc non Af Amer >60 >60 mL/min   GFR calc Af Amer >60 >60 mL/min   Anion  gap 5 5 - 15  CBC  Result Value Ref Range   WBC 6.6 3.8 - 10.6 K/uL   RBC 4.87 4.40 - 5.90 MIL/uL   Hemoglobin 14.6 13.0 - 18.0 g/dL   HCT 44.1 40.0 - 52.0 %   MCV 90.7 80.0 - 100.0 fL   MCH 29.9 26.0 - 34.0 pg   MCHC 33.0 32.0 - 36.0 g/dL   RDW 16.5 (H) 11.5 - 14.5 %   Platelets 170 150 - 440 K/uL  Troponin I  Result Value Ref Range   Troponin I <0.03 <0.03 ng/mL  Magnesium  Result Value Ref Range   Magnesium 1.9 1.7 - 2.4 mg/dL      Assessment & Plan:   Problem List Items Addressed This Visit      Unprioritized   CAD (coronary artery disease)    Refill Plavix      Relevant Medications   metoprolol succinate (TOPROL-XL) 50 MG 24 hr tablet   Diabetes mellitus without complication (HCC) - Primary    Hgb A1C  is 7.6.  Admits to a lot of sugar in his coffee plus Coke, Pepsi, and tea.  He agrees to limit these drinks.        Relevant Orders   Bayer DCA Hb A1c Waived   Comprehensive metabolic panel   Hyperlipidemia   Relevant Medications   metoprolol succinate (TOPROL-XL) 50 MG 24 hr tablet   Other Relevant Orders   Lipid Panel w/o Chol/HDL Ratio   Hypertension, accelerated with heart disease, without CHF   Relevant Medications   metoprolol succinate (TOPROL-XL) 50 MG 24 hr tablet   Other Relevant Orders   Comprehensive metabolic panel   Obesity    Other Visit Diagnoses   None.      Follow up plan: Return in about 3 months (around 04/02/2016).

## 2016-01-02 ENCOUNTER — Telehealth: Payer: Self-pay | Admitting: *Deleted

## 2016-01-02 ENCOUNTER — Encounter: Payer: Self-pay | Admitting: Unknown Physician Specialty

## 2016-01-02 LAB — COMPREHENSIVE METABOLIC PANEL
ALBUMIN: 3.9 g/dL (ref 3.6–4.8)
ALK PHOS: 91 IU/L (ref 39–117)
ALT: 22 IU/L (ref 0–44)
AST: 20 IU/L (ref 0–40)
Albumin/Globulin Ratio: 1.7 (ref 1.2–2.2)
BUN / CREAT RATIO: 8 — AB (ref 10–24)
BUN: 8 mg/dL (ref 8–27)
Bilirubin Total: 0.5 mg/dL (ref 0.0–1.2)
CO2: 24 mmol/L (ref 18–29)
CREATININE: 0.97 mg/dL (ref 0.76–1.27)
Calcium: 9.6 mg/dL (ref 8.6–10.2)
Chloride: 98 mmol/L (ref 96–106)
GFR, EST AFRICAN AMERICAN: 98 mL/min/{1.73_m2} (ref >59)
GFR, EST NON AFRICAN AMERICAN: 84 mL/min/{1.73_m2} (ref >59)
GLOBULIN, TOTAL: 2.3 g/dL (ref 1.5–4.5)
Glucose: 132 mg/dL — ABNORMAL HIGH (ref 65–99)
Potassium: 5 mmol/L (ref 3.5–5.2)
SODIUM: 139 mmol/L (ref 134–144)
TOTAL PROTEIN: 6.2 g/dL (ref 6.0–8.5)

## 2016-01-02 LAB — LIPID PANEL W/O CHOL/HDL RATIO
Cholesterol, Total: 106 mg/dL (ref 100–199)
HDL: 21 mg/dL — ABNORMAL LOW (ref >39)
LDL CALC: 36 mg/dL (ref 0–99)
Triglycerides: 245 mg/dL — ABNORMAL HIGH (ref 0–149)
VLDL CHOLESTEROL CAL: 49 mg/dL — AB (ref 5–40)

## 2016-01-02 NOTE — Telephone Encounter (Signed)
Pt left name and phone number. Pt asked for his appt date.  I told pt his appt is 01/31/2016 at 9:00am.

## 2016-01-07 DIAGNOSIS — E119 Type 2 diabetes mellitus without complications: Secondary | ICD-10-CM | POA: Diagnosis not present

## 2016-01-07 DIAGNOSIS — I429 Cardiomyopathy, unspecified: Secondary | ICD-10-CM | POA: Diagnosis not present

## 2016-01-07 DIAGNOSIS — I2510 Atherosclerotic heart disease of native coronary artery without angina pectoris: Secondary | ICD-10-CM | POA: Diagnosis not present

## 2016-01-07 DIAGNOSIS — I472 Ventricular tachycardia: Secondary | ICD-10-CM | POA: Diagnosis not present

## 2016-01-07 DIAGNOSIS — I209 Angina pectoris, unspecified: Secondary | ICD-10-CM | POA: Diagnosis not present

## 2016-01-11 ENCOUNTER — Encounter: Payer: Self-pay | Admitting: Neurology

## 2016-01-11 ENCOUNTER — Ambulatory Visit (INDEPENDENT_AMBULATORY_CARE_PROVIDER_SITE_OTHER): Payer: Medicare Other | Admitting: Neurology

## 2016-01-11 VITALS — BP 110/60 | HR 67 | Ht 63.0 in | Wt 158.0 lb

## 2016-01-11 DIAGNOSIS — R55 Syncope and collapse: Secondary | ICD-10-CM | POA: Insufficient documentation

## 2016-01-11 NOTE — Progress Notes (Signed)
NEUROLOGY CONSULTATION NOTE  John Frank MRN: [id number] DOB: 09-May-1956  Referring provider: Kathrine Haddock, NP Primary care provider: Kathrine Haddock, NP  Reason for consult:  syncope  Thank you for your kind referral of John Frank for consultation of the above symptoms. Although his history is well known to you, please allow me to reiterate it for the purpose of our medical record. The patient was accompanied to the clinic by his friend who also provides collateral information. Records and images were personally reviewed where available.  HISTORY OF PRESENT ILLNESS: This is a 60 year old right-handed man with a history of hypertension, hyperlipidemia, COPD, sleep apnea, CAD s/p pacemaker placement, tobacco use, mild developmental delay, presenting for evaluation of recurrent episodes of syncope. He and his friend are poor historians, he states the passing out started a year ago, she has known him for 5 years and has witnessed 3 or 4 episodes since 2013. He has not reported these to his cardiologist or pulmonologist. He describes the episodes usually occurring when he is coughing a lot, he then has a "funny kind of feeling" in his upper body with tingling going down his body "but not all the way," then he hits the floor. He sometimes can hold on and the episode passes, but 2 weeks ago reports he started coughing hard while fixing his closet, then apparently grabbed the clothes rod and woke up on the floor. He has some loss of awareness, asking where he is or what happened, or where his glasses are. He reports having 2 spells this morning. He recalls getting coffee then feeling shaky, he put down his coffee and "hit the floor." His friend has witnessed 3 or 4 episodes, he would be coughing then go to the ground, disoriented after. She states he does not lose consciousness. She has seen some tremulousness with them (not tonic-clonic), and has checked his glucose levels, which have been normal.  She recalls one time when he was not coughing and all of a sudden started becoming tremulous, with his "tongue rolling," unable to talk. The episodes last a few minutes, he would feel sweaty and disoriented after. No tongue bite or incontinence. His longest event-free interval has been 6 months. He denies any olfactory/gustatory hallucinations, deja vu, rising epigastric sensation, focal numbness/tingling/weakness, myoclonic jerks. He was told he fell out of the crib when he was a few months old, otherwise he reportedly had a normal birth. There is no history of febrile convulsions, CNS infections such as meningitis/encephalitis, significant traumatic brain injury, neurosurgical procedures, or family history of seizures. He is on disability because he is "slow" and reports stopping school after the 7th grade, in regular classes.   He denies any headaches, dizziness, diplopia, dysarthria, dysphagia, neck/back pain, bowel/bladder dysfunction. He had a heart attack in 2013 and had a pacemaker placed at that time. He reports having his defibrillator interrogated, with no abnormal rhythms reported despite recurrent syncopal spells (that he has not told his cardiologist about).   PAST MEDICAL HISTORY: Past Medical History:  Diagnosis Date  . COPD (chronic obstructive pulmonary disease) ([place])   . Diabetes ([place])   . GERD (gastroesophageal reflux disease)   . Heart attack ([place])   . Hyperlipidemia   . Hypertension   . Presence of permanent cardiac pacemaker   . Shortness of breath dyspnea   . Sleep apnea     PAST SURGICAL HISTORY: Past Surgical History:  Procedure Laterality Date  . CARDIAC SURGERY    .  ESOPHAGOGASTRODUODENOSCOPY N/A 09/19/2014   Procedure: ESOPHAGOGASTRODUODENOSCOPY (EGD);  Surgeon: Lucilla Lame, MD;  Location: [facility] ENDOSCOPY;  Service: Endoscopy;  Laterality: N/A;    MEDICATIONS: Current Outpatient Prescriptions on File Prior to Visit  Medication Sig Dispense Refill  . aspirin EC  81 MG tablet Take 81 mg by mouth daily.    Marland Kitchen atorvastatin (LIPITOR) 40 MG tablet TAKE 1 TABLET (40 MG TOTAL) BY MOUTH DAILY. 90 tablet 1  . budesonide-formoterol (SYMBICORT) 160-4.5 MCG/ACT inhaler Inhale 2 puffs into the lungs 2 (two) times daily. 1 Inhaler 3  . clopidogrel (PLAVIX) 75 MG tablet Take 1 tablet (75 mg total) by mouth daily. 90 tablet 3  . enalapril (VASOTEC) 5 MG tablet TAKE 1 TABLET (5 MG TOTAL) BY MOUTH DAILY. NEED F/U APPT FOR ADDITIONAL REFILLS 30 tablet 3  . famotidine (PEPCID) 40 MG tablet TAKE ONE TABLET BY MOUTH ONCE DAILY 90 tablet 3  . ipratropium-albuterol (DUONEB) 0.5-2.5 (3) MG/3ML SOLN Take 3 mLs by nebulization every 6 (six) hours as needed (wheezing; shortness of breath).    Marland Kitchen JANUVIA 100 MG tablet TAKE 1 TABLET (100 MG TOTAL) BY MOUTH DAILY. 90 tablet 0  . metFORMIN (GLUCOPHAGE) 1000 MG tablet TAKE 1 TABLET (1,000 MG TOTAL) BY MOUTH 2 (TWO) TIMES DAILY WITH A MEAL. 180 tablet 1  . metoprolol succinate (TOPROL-XL) 50 MG 24 hr tablet Take 1 tablet (50 mg total) by mouth daily. Take with or immediately following a meal. 90 tablet 1  . nitroGLYCERIN (NITROSTAT) 0.4 MG SL tablet Place 0.4 mg under the tongue every 5 (five) minutes as needed for chest pain.    . ONE TOUCH ULTRA TEST test strip USE TO TEST BLOOD SUGAR ONCE DAILY  12  . pantoprazole (PROTONIX) 40 MG tablet Take 1 tablet (40 mg total) by mouth every morning. 90 tablet 1   No current facility-administered medications on file prior to visit.     ALLERGIES: No Known Allergies  FAMILY HISTORY: Family History  Problem Relation Age of Onset  . Diabetes Mother   . Heart attack Father 53    SOCIAL HISTORY: Social History   Social History  . Marital status: Single    Spouse name: N/A  . Number of children: N/A  . Years of education: N/A   Occupational History  . Not on file.   Social History Main Topics  . Smoking status: Current Every Day Smoker    Packs/day: 0.25    Years: 40.00    Types:  Cigarettes  . Smokeless tobacco: Former Systems developer  . Alcohol use No  . Drug use: No  . Sexual activity: Yes   Other Topics Concern  . Not on file   Social History Narrative  . No narrative on file    REVIEW OF SYSTEMS: Constitutional: No fevers, chills, or sweats, no generalized fatigue, change in appetite Eyes: No visual changes, double vision, eye pain Ear, nose and throat: No hearing loss, ear pain, nasal congestion, sore throat Cardiovascular: No chest pain, palpitations Respiratory:  No shortness of breath at rest or with exertion, wheezes GastrointestinaI: No nausea, vomiting, diarrhea, abdominal pain, fecal incontinence Genitourinary:  No dysuria, urinary retention or frequency Musculoskeletal:  No neck pain, back pain Integumentary: No rash, pruritus, skin lesions Neurological: as above Psychiatric: No depression, insomnia, anxiety Endocrine: No palpitations, fatigue, diaphoresis, mood swings, change in appetite, change in weight, increased thirst Hematologic/Lymphatic:  No anemia, purpura, petechiae. Allergic/Immunologic: no itchy/runny eyes, nasal congestion, recent allergic reactions, rashes  PHYSICAL EXAM: Vitals:  01/11/16 1355  BP: 110/60  Pulse: 67   General: No acute distress, short stature, edentulous Head:  Normocephalic/atraumatic Eyes: Fundoscopic exam shows bilateral sharp discs, no vessel changes, exudates, or hemorrhages Neck: supple, no paraspinal tenderness, full range of motion Back: No paraspinal tenderness Heart: regular rate and rhythm Lungs: Clear to auscultation bilaterally. Vascular: No carotid bruits. Skin/Extremities: No rash, no edema Neurological Exam: Mental status: alert and oriented to person, place, and time, no dysarthria or aphasia, Fund of knowledge is appropriate.  Recent and remote memory are intact. 2/3 delayed recall.  Attention and concentration are normal.    Able to name objects and repeat phrases. Cranial nerves: CN I: not  tested CN II: pupils equal, round and reactive to light, visual fields intact, fundi unremarkable. CN III, IV, VI:  full range of motion, no nystagmus, no ptosis CN V: facial sensation intact CN VII: upper and lower face symmetric CN VIII: hearing intact to finger rub CN IX, X: gag intact, uvula midline CN XI: sternocleidomastoid and trapezius muscles intact CN XII: tongue midline Bulk & Tone: normal, no fasciculations. Motor: 5/5 throughout with no pronator drift. Sensation: decreased cold on right UE, otherwise intact to light touch, pin, vibration and joint position sense.  No extinction to double simultaneous stimulation.  Romberg test negative Deep Tendon Reflexes: +1 on both UE, +2 on both LE, no ankle clonus Plantar responses: downgoing bilaterally Cerebellar: no incoordination on finger to nose, heel to shin. No dysdiadochokinesia Gait: narrow-based and steady, mild difficulty with tandem walk but able Tremor: none  IMPRESSION: This is a 60 year old right-handed man with a history of hypertension, hyperlipidemia, COPD, sleep apnea, CAD s/p pacemaker placement, tobacco use, mild developmental delay, presenting for evaluation of recurrent episodes of syncope. Both the patient and his friend are poor historians, but it appears that majority of these episodes have occurred when he is coughing hard, suggestive of cough syncope. His friend however recalls one episode where he was not coughing, and also reports tremulousness/unresponsiveness but not true convulsive activity. She has checked his glucose levels during them, they have been normal. Epileptic seizures are less likely, head CT (unable to get MRI due to pacemaker) and 1-hour sleep-deprived EEG will be ordered to assess for focal abnormalities that increase risk for recurrent seizures. Carotid dopplers will be ordered for recurrent syncope. He was advised to let his pulmonologist and cardiologist know about these spells. Tobacco  cessation was discussed. We also discussed [place] driving laws were discussed with the patient, and he knows to stop driving after an episode of loss of consciousness, until 6 months event-free. He will follow-up after the tests.   Thank you for allowing me to participate in the care of this patient. Please do not hesitate to call for any questions or concerns.   Ellouise Newer, M.D.  CC: Kathrine Haddock, NP; Dr. Lujean Amel, Dr. Wallene Huh

## 2016-01-11 NOTE — Patient Instructions (Addendum)
1. Schedule head CT without contrast 2. Schedule 1-hour sleep-deprived EEG 3. Schedule carotid ultrasound 4. As per [place] driving laws, no driving after an episode of loss of consciousness, until 6 months event-free 5. Follow-up in 6-8 weeks (after tests)

## 2016-01-31 ENCOUNTER — Encounter: Payer: Self-pay | Admitting: Podiatry

## 2016-01-31 ENCOUNTER — Ambulatory Visit (INDEPENDENT_AMBULATORY_CARE_PROVIDER_SITE_OTHER): Payer: Medicare Other | Admitting: Podiatry

## 2016-01-31 DIAGNOSIS — B351 Tinea unguium: Secondary | ICD-10-CM | POA: Diagnosis not present

## 2016-01-31 DIAGNOSIS — M79676 Pain in unspecified toe(s): Secondary | ICD-10-CM

## 2016-01-31 DIAGNOSIS — E1149 Type 2 diabetes mellitus with other diabetic neurological complication: Secondary | ICD-10-CM | POA: Diagnosis not present

## 2016-01-31 DIAGNOSIS — M2042 Other hammer toe(s) (acquired), left foot: Secondary | ICD-10-CM

## 2016-01-31 DIAGNOSIS — M2041 Other hammer toe(s) (acquired), right foot: Secondary | ICD-10-CM

## 2016-01-31 NOTE — Progress Notes (Signed)
Subjective: 60 y.o. returns the office today for painful, elongated, thickened toenails which he cannot trim himself. Denies any redness or drainage around the nails. He is requesting diabetic shoes today. Denies any acute changes since last appointment and no new complaints today. Denies any systemic complaints such as fevers, chills, nausea, vomiting.   Objective: AAO 3, NAD DP/PT pulses palpable, CRT less than 3 seconds Protective sensation decreased with Simms Weinstein monofilament Nails hypertrophic, dystrophic, elongated, brittle, discolored 10. There is tenderness overlying the nails 1-5 bilaterally. There is no surrounding erythema or drainage along the nail sites. Hammertoes present b/l.  No open lesions or pre-ulcerative lesions are identified. No other areas of tenderness bilateral lower extremities. No overlying edema, erythema, increased warmth. No pain with calf compression, swelling, warmth, erythema.  Assessment: Patient presents with symptomatic onychomycosis  Plan: -Treatment options including alternatives, risks, complications were discussed -Nails sharply debrided 10 without complication/bleeding. -Paperwork completed today for precertification diabetic shoes. -Discussed daily foot inspection. If there are any changes, to call the office immediately.  -Follow-up in 3 months or sooner if any problems are to arise. In the meantime, encouraged to call the office with any questions, concerns, changes symptoms.  Celesta Gentile, DPM

## 2016-02-07 DIAGNOSIS — I472 Ventricular tachycardia: Secondary | ICD-10-CM | POA: Diagnosis not present

## 2016-02-13 ENCOUNTER — Ambulatory Visit
Admission: RE | Admit: 2016-02-13 | Discharge: 2016-02-13 | Disposition: A | Discharge status: Home / Self Care | Payer: Medicare Other | Source: Ambulatory Visit | Attending: Neurology | Admitting: Neurology

## 2016-02-13 DIAGNOSIS — R55 Syncope and collapse: Secondary | ICD-10-CM | POA: Diagnosis not present

## 2016-03-09 ENCOUNTER — Other Ambulatory Visit: Payer: Self-pay | Admitting: Unknown Physician Specialty

## 2016-03-17 ENCOUNTER — Ambulatory Visit: Payer: Medicare Other

## 2016-03-17 ENCOUNTER — Other Ambulatory Visit: Payer: Self-pay

## 2016-03-17 ENCOUNTER — Other Ambulatory Visit: Payer: Self-pay | Admitting: Unknown Physician Specialty

## 2016-03-17 DIAGNOSIS — R55 Syncope and collapse: Secondary | ICD-10-CM | POA: Diagnosis not present

## 2016-03-17 MED ORDER — SITAGLIPTIN PHOSPHATE 100 MG PO TABS: 100.0000 mg | ORAL_TABLET | Freq: Every day | ORAL | 1 refills | Status: DC

## 2016-03-21 ENCOUNTER — Telehealth: Payer: Self-pay

## 2016-03-21 NOTE — Telephone Encounter (Signed)
-----   Message from Cameron Sprang, MD sent at 03/21/2016  9:05 AM EST ----- Pls let him know the carotid ultrasound looks overall good, no significant blockage. There was mild plaque build up, important to continue control of cholesterol. Thanks

## 2016-03-21 NOTE — Telephone Encounter (Signed)
Patient notified and verbalizes understanding.

## 2016-03-26 DIAGNOSIS — J449 Chronic obstructive pulmonary disease, unspecified: Secondary | ICD-10-CM | POA: Diagnosis not present

## 2016-03-26 DIAGNOSIS — G4733 Obstructive sleep apnea (adult) (pediatric): Secondary | ICD-10-CM | POA: Diagnosis not present

## 2016-03-26 DIAGNOSIS — R0609 Other forms of dyspnea: Secondary | ICD-10-CM | POA: Diagnosis not present

## 2016-04-02 ENCOUNTER — Ambulatory Visit (INDEPENDENT_AMBULATORY_CARE_PROVIDER_SITE_OTHER): Payer: Medicare Other | Admitting: Unknown Physician Specialty

## 2016-04-02 ENCOUNTER — Encounter: Payer: Self-pay | Admitting: Unknown Physician Specialty

## 2016-04-02 ENCOUNTER — Other Ambulatory Visit: Payer: Self-pay

## 2016-04-02 VITALS — BP 127/73 | HR 79 | Temp 97.6°F | Ht 62.6 in | Wt 158.2 lb

## 2016-04-02 DIAGNOSIS — E1165 Type 2 diabetes mellitus with hyperglycemia: Secondary | ICD-10-CM | POA: Diagnosis not present

## 2016-04-02 DIAGNOSIS — I119 Hypertensive heart disease without heart failure: Secondary | ICD-10-CM

## 2016-04-02 DIAGNOSIS — Z23 Encounter for immunization: Secondary | ICD-10-CM | POA: Diagnosis not present

## 2016-04-02 DIAGNOSIS — E119 Type 2 diabetes mellitus without complications: Secondary | ICD-10-CM

## 2016-04-02 LAB — BAYER DCA HB A1C WAIVED: HB A1C: 7.6 % — AB (ref <7.0)

## 2016-04-02 LAB — MICROALBUMIN, URINE WAIVED
CREATININE, URINE WAIVED: 200 mg/dL (ref 10–300)
MICROALB, UR WAIVED: 30 mg/L — AB (ref 0–19)
Microalb/Creat Ratio: <30 mg/g (ref <30)

## 2016-04-02 LAB — HEMOGLOBIN A1C

## 2016-04-02 NOTE — Patient Instructions (Addendum)

## 2016-04-02 NOTE — Assessment & Plan Note (Signed)
Hgb A1C is 7.6.  Continue present medications and refer to the lifestyle center

## 2016-04-02 NOTE — Assessment & Plan Note (Signed)
Stable, continue present medications.   

## 2016-04-02 NOTE — Progress Notes (Signed)
BP 127/73 (BP Location: Left Arm, Patient Position: Sitting, Cuff Size: Normal)   Pulse 79   Temp 97.6 F (36.4 C)   Ht 5' 2.6" (1.59 m)   Wt 158 lb 3.2 oz (71.8 kg)   SpO2 96%   BMI 28.38 kg/m    Subjective:    Patient ID: John Frank, male    DOB: [birth year], 60 y.o.   MRN: [id number]  HPI: John Frank is a 60 y.o. male  Chief Complaint  Patient presents with  . Diabetes  . Hyperlipidemia  . Hypertension   Diabetes: Using medications without difficulties No hypoglycemic episodes No hyperglycemic episodes Feet problems:none Blood Sugars averaging:Not checking eye exam within last year yes Last Hgb A1C:7.6  Hypertension  Using medications without difficulty Average home BPs not checking   Using medication without problems or lightheadedness No chest pain with exertion or shortness of breath No Edema  Elevated Cholesterol Using medications without problems No Muscle aches  Diet: not watching Exercise:none  Relevant past medical, surgical, family and social history reviewed and updated as indicated. Interim medical history since our last visit reviewed. Allergies and medications reviewed and updated.  Review of Systems  Per HPI unless specifically indicated above     Objective:    BP 127/73 (BP Location: Left Arm, Patient Position: Sitting, Cuff Size: Normal)   Pulse 79   Temp 97.6 F (36.4 C)   Ht 5' 2.6" (1.59 m)   Wt 158 lb 3.2 oz (71.8 kg)   SpO2 96%   BMI 28.38 kg/m   Wt Readings from Last 3 Encounters:  04/02/16 158 lb 3.2 oz (71.8 kg)  01/11/16 158 lb (71.7 kg)  01/01/16 160 lb 6.4 oz (72.8 kg)    Physical Exam  Constitutional: He is oriented to person, place, and time. He appears well-developed and well-nourished. No distress.  HENT:  Head: Normocephalic and atraumatic.  Eyes: Conjunctivae and lids are normal. Right eye exhibits no discharge. Left eye exhibits no discharge. No scleral icterus.  Neck: Normal range of motion. Neck  supple. No JVD present. Carotid bruit is not present.  Cardiovascular: Normal rate, regular rhythm and normal heart sounds.   Pulmonary/Chest: Effort normal. No respiratory distress. He has decreased breath sounds.  Abdominal: Normal appearance. There is no splenomegaly or hepatomegaly.  Musculoskeletal: Normal range of motion.  Neurological: He is alert and oriented to person, place, and time.  Skin: Skin is warm, dry and intact. No rash noted. No pallor.  Psychiatric: He has a normal mood and affect. His behavior is normal. Judgment and thought content normal.    Results for orders placed or performed in visit on 01/01/16  Hemoglobin A1c  Result Value Ref Range   Hemoglobin A1C 7.6%       Assessment & Plan:   Problem List Items Addressed This Visit      Unprioritized   Diabetes mellitus without complication ([place])    Hgb A1C is 7.6.  Continue present medications and refer to the lifestyle center      Relevant Orders   Amb Referral to Nutrition and Diabetic E   Hypertension, accelerated with heart disease, without CHF    Stable, continue present medications.        Relevant Orders   Comprehensive metabolic panel    Other Visit Diagnoses    Need for influenza vaccination    -  Primary   Relevant Orders   Flu Vaccine QUAD 36+ mos IM (Completed)   Poorly  controlled type 2 diabetes mellitus ([place])       Relevant Orders   Comprehensive metabolic panel   Bayer DCA Hb A1c Waived   Microalbumin, Urine Waived       Follow up plan: Return in about 3 months (around 07/03/2016).

## 2016-04-03 LAB — COMPREHENSIVE METABOLIC PANEL
A/G RATIO: 1.8 (ref 1.2–2.2)
ALBUMIN: 4.4 g/dL (ref 3.6–4.8)
ALK PHOS: 106 IU/L (ref 39–117)
ALT: 15 IU/L (ref 0–44)
AST: 19 IU/L (ref 0–40)
BUN / CREAT RATIO: 9 — AB (ref 10–24)
BUN: 9 mg/dL (ref 8–27)
Bilirubin Total: 0.3 mg/dL (ref 0.0–1.2)
CO2: 25 mmol/L (ref 18–29)
CREATININE: 1.05 mg/dL (ref 0.76–1.27)
Calcium: 9.7 mg/dL (ref 8.6–10.2)
Chloride: 99 mmol/L (ref 96–106)
GFR calc Af Amer: 88 mL/min/{1.73_m2} (ref >59)
GFR calc non Af Amer: 76 mL/min/{1.73_m2} (ref >59)
GLOBULIN, TOTAL: 2.5 g/dL (ref 1.5–4.5)
Glucose: 146 mg/dL — ABNORMAL HIGH (ref 65–99)
POTASSIUM: 4.6 mmol/L (ref 3.5–5.2)
SODIUM: 141 mmol/L (ref 134–144)
Total Protein: 6.9 g/dL (ref 6.0–8.5)

## 2016-04-08 ENCOUNTER — Ambulatory Visit: Payer: Self-pay | Admitting: Neurology

## 2016-04-16 ENCOUNTER — Ambulatory Visit: Payer: Medicare Other | Admitting: Unknown Physician Specialty

## 2016-04-18 ENCOUNTER — Encounter: Payer: Medicare Other | Attending: Unknown Physician Specialty | Admitting: *Deleted

## 2016-04-18 ENCOUNTER — Encounter: Payer: Self-pay | Admitting: *Deleted

## 2016-04-18 VITALS — BP 114/70 | Ht 63.0 in | Wt 157.5 lb

## 2016-04-18 DIAGNOSIS — E119 Type 2 diabetes mellitus without complications: Secondary | ICD-10-CM | POA: Diagnosis not present

## 2016-04-18 DIAGNOSIS — Z713 Dietary counseling and surveillance: Secondary | ICD-10-CM | POA: Diagnosis not present

## 2016-04-18 NOTE — Patient Instructions (Addendum)
Check blood sugars 1 x day before breakfast or 2 hrs after supper every day  Exercise:  Walk  for   15  minutes   3  days a week and gradually increase to 30 minutes 5 days a week  Eat 3 meals day, 1-2 snacks a day Space meals 4-6 hours apart Don't skip meals Limit foods high in fat (desserts/sweets, chips) Avoid sugar sweetened drinks (soda, tea, coffee) Complete 3 Day Food Record and bring to next appt  Quit smoking  Bring blood sugar records to the next appointment  Return for appointment on:  Friday May 09, 2016 at 9:00 am with [facility] (dietitian)

## 2016-04-18 NOTE — Progress Notes (Signed)
Diabetes Self-Management Education  Visit Type: First/Initial  Appt. Start Time: 0920 Appt. End Time: 1020  04/18/2016  Mr. John Frank, identified by name and date of birth, is a 60 y.o. male with a diagnosis of Diabetes: Type 2.   ASSESSMENT  Blood pressure 114/70, height '5\' 3"'$  (1.6 m), weight 157 lb 8 oz (71.4 kg). Body mass index is 27.9 kg/m.      Diabetes Self-Management Education - 04/18/16 1039      Visit Information   Visit Type First/Initial     Initial Visit   Diabetes Type Type 2   Are you currently following a meal plan? No   Are you taking your medications as prescribed? Yes   Date Diagnosed 2 years ago     Psychosocial Assessment   Patient Belief/Attitude about Diabetes Denial   Self-care barriers Low literacy   Self-management support Doctor's office;Friends   Other persons present Friend   Patient Concerns Nutrition/Meal planning   Special Needs Simplified materials   Preferred Learning Style Auditory   Learning Readiness Contemplating   How often do you need to have someone help you when you read instructions, pamphlets, or other written materials from your doctor or pharmacy? 3 - Sometimes   What is the last grade level you completed in school? 7th     Pre-Education Assessment   Patient understands the diabetes disease and treatment process. Needs Instruction   Patient understands incorporating nutritional management into lifestyle. Needs Instruction   Patient undertands incorporating physical activity into lifestyle. Needs Instruction   Patient understands using medications safely. Needs Instruction   Patient understands monitoring blood glucose, interpreting and using results Needs Review   Patient understands prevention, detection, and treatment of acute complications. Needs Instruction   Patient understands prevention, detection, and treatment of chronic complications. Needs Instruction   Patient understands how to develop strategies to address  psychosocial issues. Needs Instruction   Patient understands how to develop strategies to promote health/change behavior. Needs Instruction     Complications   Last HgB A1C per patient/outside source 7.6 %  04/02/16   How often do you check your blood sugar? 3-4 times / week  Pt reports he checked his blood sugar this morning after drinking coffee with sugar - result was 164 mg/dL.    Have you had a dilated eye exam in the past 12 months? Yes   Have you had a dental exam in the past 12 months? Yes   Are you checking your feet? No     Dietary Intake   Breakfast skips   Snack (morning) cookies, donuts   Lunch skips   Snack (afternoon) cookies, donuts   Dinner meat and 2 vegetables - beans, peas, potatoes   Snack (evening) cookies, donuts   Beverage(s) coffee with sugar (6-7 cups/day) regular soda (4-5 per day) sugar sweetened tea     Exercise   Exercise Type ADL's     Patient Education   Previous Diabetes Education Yes (please comment)  Pt reports he came to classes in the last few years.    Disease state  Definition of diabetes, type 1 and 2, and the diagnosis of diabetes   Nutrition management  Role of diet in the treatment of diabetes and the relationship between the three main macronutrients and blood glucose level   Physical activity and exercise  Role of exercise on diabetes management, blood pressure control and cardiac health.   Medications Reviewed patients medication for diabetes, action, purpose, timing of dose  and side effects.   Monitoring Purpose and frequency of SMBG.;Taught/discussed recording of test results and interpretation of SMBG.;Identified appropriate SMBG and/or A1C goals.   Chronic complications Relationship between chronic complications and blood glucose control   Psychosocial adjustment Role of stress on diabetes;Identified and addressed patients feelings and concerns about diabetes Referral made to [facility] for stress counseling.   Personal strategies to  promote health Review risk of smoking and offered smoking cessation     Individualized Goals (developed by patient)   Reducing Risk Quit smoking     Outcomes   Expected Outcomes Demonstrated limited interest in learning.  Expect minimal changes      Individualized Plan for Diabetes Self-Management Training:   Learning Objective:  Patient will have a greater understanding of diabetes self-management. Patient education plan is to attend individual and/or group sessions per assessed needs and concerns.   Plan:   Patient Instructions  Check blood sugars 1 x day before breakfast or 2 hrs after supper every day Exercise:  Walk  for   15  minutes   3  days a week and gradually increase to 30 minutes 5 days a week Eat 3 meals day, 1-2 snacks a day Space meals 4-6 hours apart Don't skip meals Limit foods high in fat (desserts/sweets, chips) Avoid sugar sweetened drinks (soda, tea, coffee) Complete 3 Day Food Record and bring to next appt Quit smoking Bring blood sugar records to the next appointment Return for appointment on:  Friday May 09, 2016 at 9:00 am with [facility] (dietitian)   Expected Outcomes:  Demonstrated limited interest in learning.  Expect minimal changes  Education material provided:  General Meal Planning Guidelines Simple Meal Plan  If problems or questions, patient to contact team via:  Johny Drilling, [place], [place], CDE [phone]  Future DSME appointment:  Friday May 09, 2016 with the dietitian

## 2016-04-21 ENCOUNTER — Encounter: Payer: Self-pay | Admitting: Neurology

## 2016-04-29 DIAGNOSIS — I472 Ventricular tachycardia: Secondary | ICD-10-CM | POA: Diagnosis not present

## 2016-05-08 ENCOUNTER — Ambulatory Visit: Payer: Medicare Other | Admitting: Podiatry

## 2016-05-09 ENCOUNTER — Ambulatory Visit: Payer: Self-pay | Admitting: Dietician

## 2016-05-21 ENCOUNTER — Encounter: Payer: Self-pay | Admitting: Dietician

## 2016-05-21 NOTE — Progress Notes (Signed)
Patient has not rescheduled his missed appointment, showed minimal interest in making changes. Sent discharge letter to MD office.

## 2016-06-14 ENCOUNTER — Other Ambulatory Visit: Payer: Self-pay | Admitting: Unknown Physician Specialty

## 2016-07-04 ENCOUNTER — Ambulatory Visit: Payer: Self-pay | Admitting: Unknown Physician Specialty

## 2016-07-07 DIAGNOSIS — I472 Ventricular tachycardia: Secondary | ICD-10-CM | POA: Diagnosis not present

## 2016-07-07 DIAGNOSIS — I429 Cardiomyopathy, unspecified: Secondary | ICD-10-CM | POA: Diagnosis not present

## 2016-07-07 DIAGNOSIS — I2510 Atherosclerotic heart disease of native coronary artery without angina pectoris: Secondary | ICD-10-CM | POA: Diagnosis not present

## 2016-07-07 DIAGNOSIS — E119 Type 2 diabetes mellitus without complications: Secondary | ICD-10-CM | POA: Diagnosis not present

## 2016-07-07 DIAGNOSIS — I209 Angina pectoris, unspecified: Secondary | ICD-10-CM | POA: Diagnosis not present

## 2016-07-08 DIAGNOSIS — G4733 Obstructive sleep apnea (adult) (pediatric): Secondary | ICD-10-CM | POA: Diagnosis not present

## 2016-07-21 ENCOUNTER — Other Ambulatory Visit: Payer: Self-pay | Admitting: Unknown Physician Specialty

## 2016-07-22 DIAGNOSIS — I472 Ventricular tachycardia: Secondary | ICD-10-CM | POA: Diagnosis not present

## 2016-07-30 DIAGNOSIS — J449 Chronic obstructive pulmonary disease, unspecified: Secondary | ICD-10-CM | POA: Diagnosis not present

## 2016-07-30 DIAGNOSIS — G4733 Obstructive sleep apnea (adult) (pediatric): Secondary | ICD-10-CM | POA: Diagnosis not present

## 2016-08-07 ENCOUNTER — Other Ambulatory Visit: Payer: Self-pay | Admitting: Unknown Physician Specialty

## 2016-08-28 DIAGNOSIS — B351 Tinea unguium: Secondary | ICD-10-CM | POA: Diagnosis not present

## 2016-08-28 DIAGNOSIS — L03031 Cellulitis of right toe: Secondary | ICD-10-CM | POA: Diagnosis not present

## 2016-09-01 LAB — HM DIABETES EYE EXAM

## 2016-09-07 ENCOUNTER — Other Ambulatory Visit: Payer: Self-pay | Admitting: Unknown Physician Specialty

## 2016-09-08 ENCOUNTER — Other Ambulatory Visit: Payer: Self-pay | Admitting: Unknown Physician Specialty

## 2016-09-08 MED ORDER — FAMOTIDINE 40 MG PO TABS
40.0000 mg | ORAL_TABLET | Freq: Every day | ORAL | 3 refills | Status: DC
Start: 2016-09-08 — End: 2017-05-08

## 2016-09-08 NOTE — Addendum Note (Signed)
Addended by: Kathrine Haddock on: 09/08/2016 11:54 AM   Modules accepted: Orders

## 2016-09-12 ENCOUNTER — Other Ambulatory Visit: Payer: Self-pay | Admitting: Unknown Physician Specialty

## 2016-09-12 DIAGNOSIS — B351 Tinea unguium: Secondary | ICD-10-CM | POA: Diagnosis not present

## 2016-09-12 DIAGNOSIS — E119 Type 2 diabetes mellitus without complications: Secondary | ICD-10-CM | POA: Diagnosis not present

## 2016-09-12 DIAGNOSIS — L03031 Cellulitis of right toe: Secondary | ICD-10-CM | POA: Diagnosis not present

## 2016-09-19 ENCOUNTER — Ambulatory Visit: Payer: Self-pay | Admitting: Neurology

## 2016-09-19 DIAGNOSIS — Z029 Encounter for administrative examinations, unspecified: Secondary | ICD-10-CM

## 2016-09-25 DIAGNOSIS — J449 Chronic obstructive pulmonary disease, unspecified: Secondary | ICD-10-CM | POA: Diagnosis not present

## 2016-09-25 DIAGNOSIS — E119 Type 2 diabetes mellitus without complications: Secondary | ICD-10-CM | POA: Diagnosis not present

## 2016-09-25 DIAGNOSIS — E784 Other hyperlipidemia: Secondary | ICD-10-CM | POA: Diagnosis not present

## 2016-09-25 DIAGNOSIS — G4733 Obstructive sleep apnea (adult) (pediatric): Secondary | ICD-10-CM | POA: Diagnosis not present

## 2016-09-26 DIAGNOSIS — E119 Type 2 diabetes mellitus without complications: Secondary | ICD-10-CM | POA: Diagnosis not present

## 2016-09-26 DIAGNOSIS — Z125 Encounter for screening for malignant neoplasm of prostate: Secondary | ICD-10-CM | POA: Diagnosis not present

## 2016-09-26 DIAGNOSIS — Z79899 Other long term (current) drug therapy: Secondary | ICD-10-CM | POA: Diagnosis not present

## 2016-09-26 DIAGNOSIS — E784 Other hyperlipidemia: Secondary | ICD-10-CM | POA: Diagnosis not present

## 2016-10-03 DIAGNOSIS — Z1211 Encounter for screening for malignant neoplasm of colon: Secondary | ICD-10-CM | POA: Diagnosis not present

## 2016-10-05 ENCOUNTER — Other Ambulatory Visit: Payer: Self-pay | Admitting: Unknown Physician Specialty

## 2016-10-08 DIAGNOSIS — J209 Acute bronchitis, unspecified: Secondary | ICD-10-CM | POA: Diagnosis not present

## 2016-10-08 DIAGNOSIS — J019 Acute sinusitis, unspecified: Secondary | ICD-10-CM | POA: Diagnosis not present

## 2016-10-08 DIAGNOSIS — B9689 Other specified bacterial agents as the cause of diseases classified elsewhere: Secondary | ICD-10-CM | POA: Diagnosis not present

## 2016-10-18 ENCOUNTER — Encounter: Payer: Self-pay | Admitting: Emergency Medicine

## 2016-10-18 ENCOUNTER — Emergency Department: Payer: Medicare Other

## 2016-10-18 ENCOUNTER — Emergency Department
Admission: EM | Admit: 2016-10-18 | Discharge: 2016-10-18 | Disposition: A | Discharge status: Home / Self Care | Payer: Medicare Other | Attending: Emergency Medicine | Admitting: Emergency Medicine

## 2016-10-18 DIAGNOSIS — Z7982 Long term (current) use of aspirin: Secondary | ICD-10-CM | POA: Insufficient documentation

## 2016-10-18 DIAGNOSIS — Z7984 Long term (current) use of oral hypoglycemic drugs: Secondary | ICD-10-CM | POA: Insufficient documentation

## 2016-10-18 DIAGNOSIS — E119 Type 2 diabetes mellitus without complications: Secondary | ICD-10-CM | POA: Insufficient documentation

## 2016-10-18 DIAGNOSIS — Z7902 Long term (current) use of antithrombotics/antiplatelets: Secondary | ICD-10-CM | POA: Insufficient documentation

## 2016-10-18 DIAGNOSIS — J209 Acute bronchitis, unspecified: Secondary | ICD-10-CM | POA: Insufficient documentation

## 2016-10-18 DIAGNOSIS — I10 Essential (primary) hypertension: Secondary | ICD-10-CM | POA: Diagnosis not present

## 2016-10-18 DIAGNOSIS — J449 Chronic obstructive pulmonary disease, unspecified: Secondary | ICD-10-CM | POA: Diagnosis not present

## 2016-10-18 DIAGNOSIS — I252 Old myocardial infarction: Secondary | ICD-10-CM | POA: Diagnosis not present

## 2016-10-18 DIAGNOSIS — R05 Cough: Secondary | ICD-10-CM | POA: Diagnosis not present

## 2016-10-18 DIAGNOSIS — J40 Bronchitis, not specified as acute or chronic: Secondary | ICD-10-CM | POA: Diagnosis not present

## 2016-10-18 DIAGNOSIS — F17210 Nicotine dependence, cigarettes, uncomplicated: Secondary | ICD-10-CM | POA: Diagnosis not present

## 2016-10-18 MED ORDER — BENZONATATE 100 MG PO CAPS: 100.0000 mg | ORAL_CAPSULE | Freq: Three times a day (TID) | ORAL | 0 refills | Status: DC | PRN

## 2016-10-18 MED ORDER — HYDROCORTISONE 1 % EX OINT: 1.0000 "application " | TOPICAL_OINTMENT | Freq: Two times a day (BID) | CUTANEOUS | 0 refills | Status: DC

## 2016-10-18 MED ORDER — IPRATROPIUM-ALBUTEROL 0.5-2.5 (3) MG/3ML IN SOLN
3.0000 mL | Freq: Once | RESPIRATORY_TRACT | Status: AC
  Administered 2016-10-18: 3 mL via RESPIRATORY_TRACT
  Filled 2016-10-18: qty 3

## 2016-10-18 NOTE — ED Provider Notes (Signed)
[facility] Emergency Department Provider Note ____________________________________________  Time seen: 2125  I have reviewed the triage vital signs and the nursing notes.  HISTORY  Chief Complaint  Cough  HPI John Frank is a 61 y.o. male presents to the ED for evaluation of a nonproductive cough for the last week. Patient describes the cough is slightly worse when lying down tonight. He denies any interim fevers, chills, sweats. Denies any shortness breath, chest pain, or hemoptysis. He was she is about 3 weeks prior for an acute bronchitis and sinusitis. He reports complete a course of doxycycline without incident. He does report some improvement of his symptoms following the antibody course. His symptoms seem to return the last few days. He also noticed some intermittent runny nose and sneezing. He has been dosing over-the-counter Robitussin-DM, sighting that the prescription cough syrup was too expensive to fill.  Past Medical History:  Diagnosis Date  . COPD (chronic obstructive pulmonary disease) ([place])   . Diabetes ([place])   . GERD (gastroesophageal reflux disease)   . Heart attack ([place])   . Hyperlipidemia   . Hypertension   . Presence of permanent cardiac pacemaker   . Shortness of breath dyspnea   . Sleep apnea     Patient Active Problem List   Diagnosis Date Noted  . Faintness 01/11/2016  . Obesity 01/01/2016  . Tobacco abuse 04/06/2015  . COPD exacerbation ([place]) 12/24/2014  . Hypertension, accelerated with heart disease, without CHF 11/03/2014  . Sleep apnea 10/26/2014  . CAD (coronary artery disease) 10/26/2014  . GERD (gastroesophageal reflux disease) 10/26/2014  . Polyp of colon 10/26/2014  . Diabetes mellitus without complication ([place]) 29/51/8841  . Hyperlipidemia 10/26/2014  . COPD, severe ([place]) 10/26/2014  . Status cardiac pacemaker 10/26/2014  . Lesion of nasal septum 10/26/2014    Past Surgical History:  Procedure Laterality  Date  . CARDIAC SURGERY     defibrillator and pacemaker  . ESOPHAGOGASTRODUODENOSCOPY N/A 09/19/2014   Procedure: ESOPHAGOGASTRODUODENOSCOPY (EGD);  Surgeon: Lucilla Lame, MD;  Location: [facility] ENDOSCOPY;  Service: Endoscopy;  Laterality: N/A;    Prior to Admission medications   Medication Sig Start Date End Date Taking? Authorizing Provider  aspirin EC 81 MG tablet Take 81 mg by mouth daily.    [provider]  atorvastatin (LIPITOR) 40 MG tablet TAKE 1 TABLET (40 MG TOTAL) BY MOUTH DAILY. 09/08/16   Kathrine Haddock, NP  benzonatate (TESSALON PERLES) 100 MG capsule Take 1 capsule (100 mg total) by mouth 3 (three) times daily as needed for cough (Take 1-2 per dose). 10/18/16   Sarahanne Novakowski, Dannielle Karvonen, PA-C  budesonide-formoterol (SYMBICORT) 160-4.5 MCG/ACT inhaler Inhale 2 puffs into the lungs 2 (two) times daily. 06/15/15   Kathrine Haddock, NP  buPROPion (WELLBUTRIN) 75 MG tablet Take 75 mg by mouth daily. 03/26/16 03/26/17  [provider]  clopidogrel (PLAVIX) 75 MG tablet Take 1 tablet (75 mg total) by mouth daily. 01/01/16   Kathrine Haddock, NP  enalapril (VASOTEC) 5 MG tablet TAKE 1 TABLET (5 MG TOTAL) BY MOUTH DAILY. NEED F/U APPT FOR ADDITIONAL REFILLS 08/07/16   Kathrine Haddock, NP  famotidine (PEPCID) 40 MG tablet Take 1 tablet (40 mg total) by mouth daily. 09/08/16   Kathrine Haddock, NP  hydrocortisone 1 % ointment Apply 1 application topically 2 (two) times daily. 10/18/16   Latanza Pfefferkorn, Dannielle Karvonen, PA-C  ipratropium-albuterol (DUONEB) 0.5-2.5 (3) MG/3ML SOLN Take 3 mLs by nebulization every 6 (six) hours as needed (wheezing; shortness  of breath).    [provider]  JANUVIA 100 MG tablet TAKE 1 TABLET (100 MG TOTAL) BY MOUTH DAILY. 09/08/16   Kathrine Haddock, NP  metFORMIN (GLUCOPHAGE) 1000 MG tablet TAKE 1 TABLET (1,000 MG TOTAL) BY MOUTH 2 (TWO) TIMES DAILY WITH A MEAL. 09/12/16   Kathrine Haddock, NP  metoprolol succinate (TOPROL-XL) 50 MG 24 hr tablet TAKE 1 TABLET (50 MG  TOTAL) BY MOUTH DAILY. TAKE WITH OR IMMEDIATELY FOLLOWING A MEAL. 07/21/16   Kathrine Haddock, NP  nitroGLYCERIN (NITROSTAT) 0.4 MG SL tablet Place 0.4 mg under the tongue every 5 (five) minutes as needed for chest pain.    [provider]  ONE TOUCH ULTRA TEST test strip USE TO TEST BLOOD SUGAR ONCE DAILY 07/18/15   [provider]  ONE TOUCH ULTRA TEST test strip USE TO TEST BLOOD SUGAR ONCE DAILY 10/07/16   Johnson, Megan P, DO  pantoprazole (PROTONIX) 40 MG tablet TAKE 1 TABLET (40 MG TOTAL) BY MOUTH EVERY MORNING. 06/16/16   Kathrine Haddock, NP    Allergies Patient has no known allergies.  Family History  Problem Relation Age of Onset  . Diabetes Mother   . Heart attack Father 16    Social History Social History  Substance Use Topics  . Smoking status: Current Every Day Smoker    Packs/day: 0.50    Years: 40.00    Types: Cigarettes  . Smokeless tobacco: Former Systems developer  . Alcohol use No    Review of Systems  Constitutional: Negative for fever. Eyes: Negative for visual changes. ENT: Negative for sore throat. Cardiovascular: Negative for chest pain. Respiratory: Negative for shortness of breath. Skin: Negative for rash. Neurological: Negative for headaches, focal weakness or numbness. ____________________________________________  PHYSICAL EXAM:  VITAL SIGNS: ED Triage Vitals  Enc Vitals Group     BP 10/18/16 2032 127/70     Pulse Rate 10/18/16 2032 85     Resp 10/18/16 2032 20     Temp 10/18/16 2032 98.1 F (36.7 C)     Temp Source 10/18/16 2032 Oral     SpO2 10/18/16 2032 95 %     Weight 10/18/16 2034 125 lb (56.7 kg)     Height 10/18/16 2034 5\' 4"  (1.626 m)     Head Circumference --      Peak Flow --      Pain Score 10/18/16 2042 0     Pain Loc --      Pain Edu? --      Excl. in [place]? --     Constitutional: Alert and oriented. Well appearing and in no distress. Head: Normocephalic and atraumatic. Eyes: Conjunctivae are normal. PERRL. Normal  extraocular movements Ears: Canals clear. TMs intact bilaterally. Nose: No congestion/rhinorrhea/epistaxis. Mouth/Throat: Mucous membranes are moist. Hematological/Lymphatic/Immunological: No cervical lymphadenopathy. Cardiovascular: Normal rate, regular rhythm. Normal distal pulses. Respiratory: Normal respiratory effort. No wheezes/rales/rhonchi. Skin:  Skin is warm, dry and intact. No rash noted. Erythematous, sun-exposed skin to the extensor left forearm. Underlying psoriasis noted.  ____________________________________________   RADIOLOGY  CXR  IMPRESSION: Mild diffuse interstitial prominence, new since prior consistent with acute bronchitic change. Stable nodular density at the right lung base measuring 5 mm.  I, Naomii Kreger, Dannielle Karvonen, personally viewed and evaluated these images (plain radiographs) as part of my medical decision making, as well as reviewing the written report by the radiologist. ___________________________________________  PROCEDURES  DuoNeb x 1 ____________________________________________  INITIAL IMPRESSION / ASSESSMENT AND PLAN / ED COURSE  Patient with  an x-ray that is negative for any acute infectious process. Does show an acute bronchitis. Patient discharged with a prescription for Tessalon Perles and hydrocortisone ointment for his scan. He will follow-up with his primary care provider for ongoing management. Return precautions were reviewed. ____________________________________________  FINAL CLINICAL IMPRESSION(S) / ED DIAGNOSES  Final diagnoses:  Bronchitis      Terran Klinke, Dannielle Karvonen, PA-C 10/18/16 2349    Earleen Newport, MD 10/23/16 [phone]

## 2016-10-18 NOTE — ED Triage Notes (Signed)
Pt ambulatory to triage in NAD, report non-productive cough x 1 week, reports worse when lying down.  Pt in NAD at this time, resp equal and unlabored, skin arm and dry

## 2016-10-18 NOTE — Discharge Instructions (Signed)
Your exam and x-ray are essentially normal. Your x-ray confirms bronchitis. Continue to dose your OTC Robitussin DM for cough. You may also start the prescription Tessalon Perles for cough. Follow-up with Dr. Doy Hutching for continued cough. Return to the ED as needed.

## 2016-10-28 DIAGNOSIS — J449 Chronic obstructive pulmonary disease, unspecified: Secondary | ICD-10-CM | POA: Diagnosis not present

## 2016-10-28 DIAGNOSIS — J309 Allergic rhinitis, unspecified: Secondary | ICD-10-CM | POA: Diagnosis not present

## 2016-10-28 DIAGNOSIS — J40 Bronchitis, not specified as acute or chronic: Secondary | ICD-10-CM | POA: Diagnosis not present

## 2016-10-30 DIAGNOSIS — I472 Ventricular tachycardia: Secondary | ICD-10-CM | POA: Diagnosis not present

## 2016-12-06 ENCOUNTER — Other Ambulatory Visit: Payer: Self-pay | Admitting: Unknown Physician Specialty

## 2016-12-17 ENCOUNTER — Encounter: Payer: Self-pay | Admitting: Emergency Medicine

## 2016-12-17 ENCOUNTER — Emergency Department
Admission: EM | Admit: 2016-12-17 | Discharge: 2016-12-18 | Disposition: A | Discharge status: Home / Self Care | Payer: Medicare Other | Attending: Emergency Medicine | Admitting: Emergency Medicine

## 2016-12-17 ENCOUNTER — Emergency Department: Payer: Medicare Other

## 2016-12-17 DIAGNOSIS — Z950 Presence of cardiac pacemaker: Secondary | ICD-10-CM | POA: Diagnosis not present

## 2016-12-17 DIAGNOSIS — R509 Fever, unspecified: Secondary | ICD-10-CM | POA: Diagnosis not present

## 2016-12-17 DIAGNOSIS — I2510 Atherosclerotic heart disease of native coronary artery without angina pectoris: Secondary | ICD-10-CM | POA: Diagnosis not present

## 2016-12-17 DIAGNOSIS — F17210 Nicotine dependence, cigarettes, uncomplicated: Secondary | ICD-10-CM | POA: Diagnosis not present

## 2016-12-17 DIAGNOSIS — J441 Chronic obstructive pulmonary disease with (acute) exacerbation: Secondary | ICD-10-CM | POA: Insufficient documentation

## 2016-12-17 DIAGNOSIS — J181 Lobar pneumonia, unspecified organism: Secondary | ICD-10-CM | POA: Insufficient documentation

## 2016-12-17 DIAGNOSIS — Z79899 Other long term (current) drug therapy: Secondary | ICD-10-CM | POA: Insufficient documentation

## 2016-12-17 DIAGNOSIS — J189 Pneumonia, unspecified organism: Secondary | ICD-10-CM

## 2016-12-17 DIAGNOSIS — Z7982 Long term (current) use of aspirin: Secondary | ICD-10-CM | POA: Insufficient documentation

## 2016-12-17 DIAGNOSIS — Z7984 Long term (current) use of oral hypoglycemic drugs: Secondary | ICD-10-CM | POA: Insufficient documentation

## 2016-12-17 DIAGNOSIS — R05 Cough: Secondary | ICD-10-CM | POA: Diagnosis not present

## 2016-12-17 DIAGNOSIS — Z7902 Long term (current) use of antithrombotics/antiplatelets: Secondary | ICD-10-CM | POA: Diagnosis not present

## 2016-12-17 DIAGNOSIS — E119 Type 2 diabetes mellitus without complications: Secondary | ICD-10-CM | POA: Diagnosis not present

## 2016-12-17 DIAGNOSIS — I10 Essential (primary) hypertension: Secondary | ICD-10-CM | POA: Diagnosis not present

## 2016-12-17 LAB — CBC
HEMATOCRIT: 35.6 % — AB (ref 40.0–52.0)
Hemoglobin: 11.5 g/dL — ABNORMAL LOW (ref 13.0–18.0)
MCH: 27.8 pg (ref 26.0–34.0)
MCHC: 32.2 g/dL (ref 32.0–36.0)
MCV: 86.3 fL (ref 80.0–100.0)
PLATELETS: 248 10*3/uL (ref 150–440)
RBC: 4.12 MIL/uL — AB (ref 4.40–5.90)
RDW: 17.8 % — AB (ref 11.5–14.5)
WBC: 11.1 10*3/uL — AB (ref 3.8–10.6)

## 2016-12-17 LAB — BASIC METABOLIC PANEL
Anion gap: 9 (ref 5–15)
BUN: 11 mg/dL (ref 6–20)
CO2: 26 mmol/L (ref 22–32)
CREATININE: 0.97 mg/dL (ref 0.61–1.24)
Calcium: 9.3 mg/dL (ref 8.9–10.3)
Chloride: 102 mmol/L (ref 101–111)
Glucose, Bld: 150 mg/dL — ABNORMAL HIGH (ref 65–99)
POTASSIUM: 3.9 mmol/L (ref 3.5–5.1)
SODIUM: 137 mmol/L (ref 135–145)

## 2016-12-17 LAB — TROPONIN I: Troponin I: <0.03 ng/mL (ref <0.03)

## 2016-12-17 MED ORDER — ALBUTEROL SULFATE (2.5 MG/3ML) 0.083% IN NEBU
5.0000 mg | INHALATION_SOLUTION | Freq: Once | RESPIRATORY_TRACT | Status: AC
  Administered 2016-12-17: 5 mg via RESPIRATORY_TRACT
  Filled 2016-12-17: qty 6

## 2016-12-17 NOTE — ED Triage Notes (Signed)
Pt c/o cough, nasal congestion and SOB x1 week. Pt denies medication at home and denies seeing pcp. Pt has productive cough in triage. Pt smokes 1/2 pack cigarettes per day.

## 2016-12-17 NOTE — ED Notes (Signed)
Pt reports fever, productive cough for several days.  Pt denies chest pain. Pt has sob.  cig smoker.  States coughing up green phlegm.  Pt alert.  Speech clear.  Pt has pacemaker.

## 2016-12-18 MED ORDER — LEVOFLOXACIN IN D5W 500 MG/100ML IV SOLN
500.0000 mg | Freq: Once | INTRAVENOUS | Status: AC
  Administered 2016-12-18: 500 mg via INTRAVENOUS
  Filled 2016-12-18: qty 100

## 2016-12-18 MED ORDER — HYDROCOD POLST-CPM POLST ER 10-8 MG/5ML PO SUER
5.0000 mL | Freq: Once | ORAL | Status: AC
  Administered 2016-12-18: 5 mL via ORAL
  Filled 2016-12-18: qty 5

## 2016-12-18 MED ORDER — LEVOFLOXACIN 500 MG PO TABS: 500.0000 mg | ORAL_TABLET | Freq: Every day | ORAL | 0 refills | Status: AC

## 2016-12-18 MED ORDER — IPRATROPIUM-ALBUTEROL 0.5-2.5 (3) MG/3ML IN SOLN
3.0000 mL | Freq: Once | RESPIRATORY_TRACT | Status: AC
  Administered 2016-12-18: 3 mL via RESPIRATORY_TRACT

## 2016-12-18 MED ORDER — IPRATROPIUM-ALBUTEROL 0.5-2.5 (3) MG/3ML IN SOLN
RESPIRATORY_TRACT | Status: AC
  Administered 2016-12-18: 3 mL via RESPIRATORY_TRACT
  Filled 2016-12-18: qty 3

## 2016-12-18 MED ORDER — HYDROCOD POLST-CPM POLST ER 10-8 MG/5ML PO SUER: 5.0000 mL | Freq: Two times a day (BID) | ORAL | 0 refills | Status: DC | PRN

## 2016-12-18 NOTE — ED Provider Notes (Signed)
[facility] Emergency Department Provider Note    First MD Initiated Contact with Patient 12/17/16 2357     (approximate)  I have reviewed the triage vital signs and the nursing notes.   HISTORY  Chief Complaint Cough and Fever   HPI AKUL LEGGETTE is a 61 y.o. male with below list of chronic medical conditions presents emergency Department with productive cough of green sputum today accompanied by subjective fever. Patient does admit to a half a pack per day of cigarette use. Patient admits to previous history of pneumonia and bronchitis.   Past Medical History:  Diagnosis Date  . COPD (chronic obstructive pulmonary disease) ([place])   . Diabetes ([place])   . GERD (gastroesophageal reflux disease)   . Heart attack ([place])   . Hyperlipidemia   . Hypertension   . Presence of permanent cardiac pacemaker   . Shortness of breath dyspnea   . Sleep apnea     Patient Active Problem List   Diagnosis Date Noted  . Faintness 01/11/2016  . Obesity 01/01/2016  . Tobacco abuse 04/06/2015  . COPD exacerbation ([place]) 12/24/2014  . Hypertension, accelerated with heart disease, without CHF 11/03/2014  . Sleep apnea 10/26/2014  . CAD (coronary artery disease) 10/26/2014  . GERD (gastroesophageal reflux disease) 10/26/2014  . Polyp of colon 10/26/2014  . Diabetes mellitus without complication ([place]) 84/16/6063  . Hyperlipidemia 10/26/2014  . COPD, severe ([place]) 10/26/2014  . Status cardiac pacemaker 10/26/2014  . Lesion of nasal septum 10/26/2014    Past Surgical History:  Procedure Laterality Date  . CARDIAC SURGERY     defibrillator and pacemaker  . ESOPHAGOGASTRODUODENOSCOPY N/A 09/19/2014   Procedure: ESOPHAGOGASTRODUODENOSCOPY (EGD);  Surgeon: Lucilla Lame, MD;  Location: [facility] ENDOSCOPY;  Service: Endoscopy;  Laterality: N/A;    Prior to Admission medications   Medication Sig Start Date End Date Taking? Authorizing Provider  aspirin EC 81 MG tablet Take 81  mg by mouth daily.    [provider]  atorvastatin (LIPITOR) 40 MG tablet TAKE 1 TABLET (40 MG TOTAL) BY MOUTH DAILY. 09/08/16   Kathrine Haddock, NP  benzonatate (TESSALON PERLES) 100 MG capsule Take 1 capsule (100 mg total) by mouth 3 (three) times daily as needed for cough (Take 1-2 per dose). 10/18/16   Menshew, Dannielle Karvonen, PA-C  budesonide-formoterol (SYMBICORT) 160-4.5 MCG/ACT inhaler Inhale 2 puffs into the lungs 2 (two) times daily. 06/15/15   Kathrine Haddock, NP  buPROPion (WELLBUTRIN) 75 MG tablet Take 75 mg by mouth daily. 03/26/16 03/26/17  [provider]  clopidogrel (PLAVIX) 75 MG tablet TAKE 1 TABLET (75 MG TOTAL) BY MOUTH DAILY. 12/08/16   Kathrine Haddock, NP  enalapril (VASOTEC) 5 MG tablet TAKE 1 TABLET (5 MG TOTAL) BY MOUTH DAILY. NEED F/U APPT FOR ADDITIONAL REFILLS 08/07/16   Kathrine Haddock, NP  famotidine (PEPCID) 40 MG tablet Take 1 tablet (40 mg total) by mouth daily. 09/08/16   Kathrine Haddock, NP  hydrocortisone 1 % ointment Apply 1 application topically 2 (two) times daily. 10/18/16   Menshew, Dannielle Karvonen, PA-C  ipratropium-albuterol (DUONEB) 0.5-2.5 (3) MG/3ML SOLN Take 3 mLs by nebulization every 6 (six) hours as needed (wheezing; shortness of breath).    [provider]  JANUVIA 100 MG tablet TAKE 1 TABLET (100 MG TOTAL) BY MOUTH DAILY. 09/08/16   Kathrine Haddock, NP  metFORMIN (GLUCOPHAGE) 1000 MG tablet TAKE 1 TABLET (1,000 MG TOTAL) BY MOUTH 2 (TWO) TIMES DAILY WITH A MEAL. 09/12/16  Kathrine Haddock, NP  metoprolol succinate (TOPROL-XL) 50 MG 24 hr tablet TAKE 1 TABLET (50 MG TOTAL) BY MOUTH DAILY. TAKE WITH OR IMMEDIATELY FOLLOWING A MEAL. 07/21/16   Kathrine Haddock, NP  nitroGLYCERIN (NITROSTAT) 0.4 MG SL tablet Place 0.4 mg under the tongue every 5 (five) minutes as needed for chest pain.    [provider]  ONE TOUCH ULTRA TEST test strip USE TO TEST BLOOD SUGAR ONCE DAILY 07/18/15   [provider]  ONE TOUCH ULTRA TEST test strip  USE TO TEST BLOOD SUGAR ONCE DAILY 10/07/16   Johnson, Megan P, DO  pantoprazole (PROTONIX) 40 MG tablet TAKE 1 TABLET (40 MG TOTAL) BY MOUTH EVERY MORNING. 12/08/16   Kathrine Haddock, NP    Allergies No known drug allergies  Family History  Problem Relation Age of Onset  . Diabetes Mother   . Heart attack Father 90    Social History Social History  Substance Use Topics  . Smoking status: Current Every Day Smoker    Packs/day: 0.50    Years: 40.00    Types: Cigarettes  . Smokeless tobacco: Former Systems developer  . Alcohol use No    Review of Systems Constitutional: Positive for fever/chills Eyes: No visual changes. ENT: No sore throat. Cardiovascular: Denies chest pain. Respiratory: Denies shortness of breath.Positive for cough Gastrointestinal: No abdominal pain.  No nausea, no vomiting.  No diarrhea.  No constipation. Genitourinary: Negative for dysuria. Musculoskeletal: Negative for neck pain.  Negative for back pain. Integumentary: Negative for rash. Neurological: Negative for headaches, focal weakness or numbness.   ____________________________________________   PHYSICAL EXAM:  VITAL SIGNS: ED Triage Vitals  Enc Vitals Group     BP 12/17/16 2112 134/78     Pulse Rate 12/17/16 2112 90     Resp 12/17/16 2112 17     Temp 12/17/16 2112 97.8 F (36.6 C)     Temp Source 12/17/16 2112 Oral     SpO2 12/17/16 2112 97 %     Weight 12/17/16 2113 62.1 kg (137 lb)     Height --      Head Circumference --      Peak Flow --      Pain Score 12/17/16 2339 5     Pain Loc --      Pain Edu? --      Excl. in [place]? --     Constitutional: Alert and oriented. Well appearing and in no acute distress. Eyes: Conjunctivae are normal.  Head: Atraumatic. Mouth/Throat: Mucous membranes are moist.  Oropharynx non-erythematous. Neck: No stridor.   Cardiovascular: Normal rate, regular rhythm. Good peripheral circulation. Grossly normal heart sounds. Respiratory: Normal respiratory effort.  No  retractions. Mild expiratory wheezes Gastrointestinal: Soft and nontender. No distention.  Musculoskeletal: No lower extremity tenderness nor edema. No gross deformities of extremities. Neurologic:  Normal speech and language. No gross focal neurologic deficits are appreciated.  Skin:  Skin is warm, dry and intact. No rash noted. Psychiatric: Mood and affect are normal. Speech and behavior are normal.  ____________________________________________   LABS (all labs ordered are listed, but only abnormal results are displayed)  Labs Reviewed  BASIC METABOLIC PANEL - Abnormal; Notable for the following:       Result Value   Glucose, Bld 150 (*)    All other components within normal limits  CBC - Abnormal; Notable for the following:    WBC 11.1 (*)    RBC 4.12 (*)    Hemoglobin 11.5 (*)  HCT 35.6 (*)    RDW 17.8 (*)    All other components within normal limits  TROPONIN I   ____________________________________________  EKG  ED ECG REPORT I, [place] N Alayasia Breeding, the attending physician, personally viewed and interpreted this ECG.   Date: 12/18/2016  EKG Time: 9:17 PM  Rate: 86  Rhythm: Normal sinus rhythm with premature ventricular contraction  Axis: Normal  Intervals: Normal  ST&T Change: None  ____________________________________________  RADIOLOGY I, [place] N Marijah Larranaga, personally viewed and evaluated these images (plain radiographs) as part of my medical decision making, as well as reviewing the written report by the radiologist.  Dg Chest 2 View  Result Date: 12/17/2016 CLINICAL DATA:  Acute onset of nasal congestion and shortness of breath. Productive cough. Initial encounter. EXAM: CHEST  2 VIEW COMPARISON:  Chest radiograph performed 10/18/2016 FINDINGS: The lungs are well-aerated. Peribronchial thickening is noted. Mild vascular congestion is noted. Mild right basilar airspace opacity raises concern for pneumonia. There is no evidence of pleural effusion or pneumothorax.  The heart is normal in size; the mediastinal contour is within normal limits. A pacemaker/AICD is noted at the left chest wall, with leads ending at the right atrium and right ventricle. No acute osseous abnormalities are seen. IMPRESSION: 1. Mild right basilar airspace opacity raises concern for pneumonia. 2. Mild vascular congestion.  Peribronchial thickening. Electronically Signed   By: Garald Balding M.D.   On: 12/17/2016 21:42      Procedures   ____________________________________________   INITIAL IMPRESSION / ASSESSMENT AND PLAN / ED COURSE  Pertinent labs & imaging results that were available during my care of the patient were reviewed by me and considered in my medical decision making (see chart for details).  61 year old male presenting with history of physical exam concerning for pneumonia which was confirmed on chest x-ray. Patient given Levaquin 500 mg, DuoNeb, Tussionex. Patient's oxygen saturation 96% with no apparent increased work of breathing. Spoke with the patient at length regarding warning signs for home that would warrant return to the emergency department.      ____________________________________________  FINAL CLINICAL IMPRESSION(S) / ED DIAGNOSES  Final diagnoses:  Community acquired pneumonia of right lower lobe of lung ([place])     MEDICATIONS GIVEN DURING THIS VISIT:  Medications  albuterol (PROVENTIL) (2.5 MG/3ML) 0.083% nebulizer solution 5 mg (5 mg Nebulization Given 12/17/16 2127)     NEW OUTPATIENT MEDICATIONS STARTED DURING THIS VISIT:  New Prescriptions   No medications on file    Modified Medications   No medications on file    Discontinued Medications   No medications on file     Note:  This document was prepared using Dragon voice recognition software and may include unintentional dictation errors.    Gregor Hams, MD 12/18/16 Quentin Mulling

## 2017-01-19 ENCOUNTER — Other Ambulatory Visit: Payer: Self-pay | Admitting: Unknown Physician Specialty

## 2017-01-30 ENCOUNTER — Emergency Department
Admission: EM | Admit: 2017-01-30 | Discharge: 2017-01-30 | Disposition: A | Discharge status: Home / Self Care | Payer: Medicare Other | Attending: Emergency Medicine | Admitting: Emergency Medicine

## 2017-01-30 ENCOUNTER — Encounter: Payer: Self-pay | Admitting: *Deleted

## 2017-01-30 ENCOUNTER — Emergency Department: Payer: Medicare Other

## 2017-01-30 DIAGNOSIS — J441 Chronic obstructive pulmonary disease with (acute) exacerbation: Secondary | ICD-10-CM | POA: Diagnosis not present

## 2017-01-30 DIAGNOSIS — Z7984 Long term (current) use of oral hypoglycemic drugs: Secondary | ICD-10-CM | POA: Diagnosis not present

## 2017-01-30 DIAGNOSIS — Z7982 Long term (current) use of aspirin: Secondary | ICD-10-CM | POA: Insufficient documentation

## 2017-01-30 DIAGNOSIS — Z79899 Other long term (current) drug therapy: Secondary | ICD-10-CM | POA: Diagnosis not present

## 2017-01-30 DIAGNOSIS — Z950 Presence of cardiac pacemaker: Secondary | ICD-10-CM | POA: Diagnosis not present

## 2017-01-30 DIAGNOSIS — R079 Chest pain, unspecified: Secondary | ICD-10-CM | POA: Diagnosis not present

## 2017-01-30 DIAGNOSIS — E119 Type 2 diabetes mellitus without complications: Secondary | ICD-10-CM | POA: Diagnosis not present

## 2017-01-30 DIAGNOSIS — I2510 Atherosclerotic heart disease of native coronary artery without angina pectoris: Secondary | ICD-10-CM | POA: Diagnosis not present

## 2017-01-30 DIAGNOSIS — Z7902 Long term (current) use of antithrombotics/antiplatelets: Secondary | ICD-10-CM | POA: Insufficient documentation

## 2017-01-30 DIAGNOSIS — F17210 Nicotine dependence, cigarettes, uncomplicated: Secondary | ICD-10-CM | POA: Diagnosis not present

## 2017-01-30 DIAGNOSIS — I10 Essential (primary) hypertension: Secondary | ICD-10-CM | POA: Insufficient documentation

## 2017-01-30 LAB — CBC
HCT: 37.4 % — ABNORMAL LOW (ref 40.0–52.0)
Hemoglobin: 12.3 g/dL — ABNORMAL LOW (ref 13.0–18.0)
MCH: 27.9 pg (ref 26.0–34.0)
MCHC: 32.9 g/dL (ref 32.0–36.0)
MCV: 85 fL (ref 80.0–100.0)
PLATELETS: 330 10*3/uL (ref 150–440)
RBC: 4.4 MIL/uL (ref 4.40–5.90)
RDW: 18.4 % — AB (ref 11.5–14.5)
WBC: 10.1 10*3/uL (ref 3.8–10.6)

## 2017-01-30 LAB — BASIC METABOLIC PANEL
Anion gap: 11 (ref 5–15)
BUN: 8 mg/dL (ref 6–20)
CHLORIDE: 99 mmol/L — AB (ref 101–111)
CO2: 26 mmol/L (ref 22–32)
CREATININE: 0.87 mg/dL (ref 0.61–1.24)
Calcium: 9.4 mg/dL (ref 8.9–10.3)
GFR calc Af Amer: >60 mL/min (ref >60)
GFR calc non Af Amer: >60 mL/min (ref >60)
Glucose, Bld: 122 mg/dL — ABNORMAL HIGH (ref 65–99)
Potassium: 4.6 mmol/L (ref 3.5–5.1)
Sodium: 136 mmol/L (ref 135–145)

## 2017-01-30 LAB — TROPONIN I
Troponin I: <0.03 ng/mL (ref <0.03)
Troponin I: <0.03 ng/mL (ref <0.03)

## 2017-01-30 MED ORDER — IPRATROPIUM-ALBUTEROL 0.5-2.5 (3) MG/3ML IN SOLN
3.0000 mL | Freq: Once | RESPIRATORY_TRACT | Status: AC
  Administered 2017-01-30: 3 mL via RESPIRATORY_TRACT

## 2017-01-30 MED ORDER — IPRATROPIUM-ALBUTEROL 0.5-2.5 (3) MG/3ML IN SOLN
RESPIRATORY_TRACT | Status: AC
  Filled 2017-01-30: qty 3

## 2017-01-30 NOTE — ED Notes (Signed)
Pt resting in bed, family at bedside, denies any needs

## 2017-01-30 NOTE — Discharge Instructions (Signed)
Please seek medical attention for any high fevers, chest pain, shortness of breath, change in behavior, persistent vomiting, bloody stool or any other new or concerning symptoms.  

## 2017-01-30 NOTE — ED Notes (Signed)
Pt resting in bed, denies any needs, awake and alert in no acute distress

## 2017-01-30 NOTE — ED Notes (Signed)
No fax from [facility] received after interrogation, [employer] called and a rep is coming, EDP aware

## 2017-01-30 NOTE — ED Triage Notes (Signed)
Pt arrives via EMS from home with chest pain, left sided around his ICD/ pacer that began this AM, states dull in nature, EMS gave 324 ASA and 1 spray of nitro pta, awake and alert in no acute distress

## 2017-01-30 NOTE — ED Provider Notes (Signed)
[facility] Emergency Department Provider Note  ____________________________________________   I have reviewed the triage vital signs and the nursing notes.   HISTORY  Chief Complaint Chest Pain   History limited by: Not Limited   HPI John Frank is a 61 y.o. male who presents to the emergency department today because of concerns for chest pain. The patient states it started at 5:30 this morning. He describes as being located around the site of his pacemaker. he describes it as sharp. It has been getting better since it started. He denies any associated shortness of breath. He denies any swelling or fevers. Denies any recent illness.   Past Medical History:  Diagnosis Date  . COPD (chronic obstructive pulmonary disease) ([place])   . Diabetes ([place])   . GERD (gastroesophageal reflux disease)   . Heart attack ([place])   . Hyperlipidemia   . Hypertension   . Presence of permanent cardiac pacemaker   . Shortness of breath dyspnea   . Sleep apnea     Patient Active Problem List   Diagnosis Date Noted  . Faintness 01/11/2016  . Obesity 01/01/2016  . Tobacco abuse 04/06/2015  . COPD exacerbation ([place]) 12/24/2014  . Hypertension, accelerated with heart disease, without CHF 11/03/2014  . Sleep apnea 10/26/2014  . CAD (coronary artery disease) 10/26/2014  . GERD (gastroesophageal reflux disease) 10/26/2014  . Polyp of colon 10/26/2014  . Diabetes mellitus without complication ([place]) 61/95/0932  . Hyperlipidemia 10/26/2014  . COPD, severe ([place]) 10/26/2014  . Status cardiac pacemaker 10/26/2014  . Lesion of nasal septum 10/26/2014    Past Surgical History:  Procedure Laterality Date  . CARDIAC SURGERY     defibrillator and pacemaker  . ESOPHAGOGASTRODUODENOSCOPY N/A 09/19/2014   Procedure: ESOPHAGOGASTRODUODENOSCOPY (EGD);  Surgeon: Lucilla Lame, MD;  Location: [facility] ENDOSCOPY;  Service: Endoscopy;  Laterality: N/A;    Prior to Admission medications    Medication Sig Start Date End Date Taking? Authorizing Provider  aspirin EC 81 MG tablet Take 81 mg by mouth daily.    [provider]  atorvastatin (LIPITOR) 40 MG tablet TAKE 1 TABLET (40 MG TOTAL) BY MOUTH DAILY. 09/08/16   Kathrine Haddock, NP  benzonatate (TESSALON PERLES) 100 MG capsule Take 1 capsule (100 mg total) by mouth 3 (three) times daily as needed for cough (Take 1-2 per dose). 10/18/16   Menshew, Dannielle Karvonen, PA-C  budesonide-formoterol (SYMBICORT) 160-4.5 MCG/ACT inhaler Inhale 2 puffs into the lungs 2 (two) times daily. 06/15/15   Kathrine Haddock, NP  buPROPion (WELLBUTRIN) 75 MG tablet Take 75 mg by mouth daily. 03/26/16 03/26/17  [provider]  chlorpheniramine-HYDROcodone (TUSSIONEX PENNKINETIC ER) 10-8 MG/5ML SUER Take 5 mLs by mouth every 12 (twelve) hours as needed. 12/18/16   Gregor Hams, MD  clopidogrel (PLAVIX) 75 MG tablet TAKE 1 TABLET (75 MG TOTAL) BY MOUTH DAILY. 12/08/16   Kathrine Haddock, NP  enalapril (VASOTEC) 5 MG tablet TAKE 1 TABLET (5 MG TOTAL) BY MOUTH DAILY. NEED F/U APPT FOR ADDITIONAL REFILLS 08/07/16   Kathrine Haddock, NP  famotidine (PEPCID) 40 MG tablet Take 1 tablet (40 mg total) by mouth daily. 09/08/16   Kathrine Haddock, NP  hydrocortisone 1 % ointment Apply 1 application topically 2 (two) times daily. 10/18/16   Menshew, Dannielle Karvonen, PA-C  ipratropium-albuterol (DUONEB) 0.5-2.5 (3) MG/3ML SOLN Take 3 mLs by nebulization every 6 (six) hours as needed (wheezing; shortness of breath).    [provider]  JANUVIA 100 MG tablet  TAKE 1 TABLET (100 MG TOTAL) BY MOUTH DAILY. 09/08/16   Kathrine Haddock, NP  metFORMIN (GLUCOPHAGE) 1000 MG tablet TAKE 1 TABLET (1,000 MG TOTAL) BY MOUTH 2 (TWO) TIMES DAILY WITH A MEAL. 09/12/16   Kathrine Haddock, NP  metoprolol succinate (TOPROL-XL) 50 MG 24 hr tablet TAKE 1 TABLET (50 MG TOTAL) BY MOUTH DAILY. TAKE WITH OR IMMEDIATELY FOLLOWING A MEAL. 01/19/17   Guadalupe Maple, MD  nitroGLYCERIN (NITROSTAT)  0.4 MG SL tablet Place 0.4 mg under the tongue every 5 (five) minutes as needed for chest pain.    [provider]  ONE TOUCH ULTRA TEST test strip USE TO TEST BLOOD SUGAR ONCE DAILY 07/18/15   [provider]  ONE TOUCH ULTRA TEST test strip USE TO TEST BLOOD SUGAR ONCE DAILY 10/07/16   Johnson, Megan P, DO  pantoprazole (PROTONIX) 40 MG tablet TAKE 1 TABLET (40 MG TOTAL) BY MOUTH EVERY MORNING. 12/08/16   Kathrine Haddock, NP    Allergies Patient has no known allergies.  Family History  Problem Relation Age of Onset  . Diabetes Mother   . Heart attack Father 22    Social History Social History  Substance Use Topics  . Smoking status: Current Every Day Smoker    Packs/day: 0.50    Years: 40.00    Types: Cigarettes  . Smokeless tobacco: Former Systems developer  . Alcohol use No    Review of Systems Constitutional: No fever/chills Eyes: No visual changes. ENT: No sore throat. Cardiovascular: positive for chest pain Respiratory: Denies shortness of breath. Gastrointestinal: No abdominal pain.  No nausea, no vomiting.  No diarrhea.   Genitourinary: Negative for dysuria. Musculoskeletal: Negative for back pain. Skin: Negative for rash. Neurological: Negative for headaches, focal weakness or numbness.  ____________________________________________   PHYSICAL EXAM:  VITAL SIGNS: ED Triage Vitals  Enc Vitals Group     BP 01/30/17 0940 130/83     Pulse Rate 01/30/17 0940 80     Resp 01/30/17 0940 18     Temp 01/30/17 0940 97.6 F (36.4 C)     Temp Source 01/30/17 0940 Oral     SpO2 01/30/17 0940 98 %     Weight 01/30/17 0938 155 lb (70.3 kg)     Height 01/30/17 0938 5\' 4"  (1.626 m)     Head Circumference --      Peak Flow --      Pain Score 01/30/17 0938 2   Constitutional: Alert and oriented. Well appearing and in no distress. Eyes: Conjunctivae are normal.  ENT   Head: Normocephalic and atraumatic.   Nose: No congestion/rhinnorhea.   Mouth/Throat:  Mucous membranes are moist.   Neck: No stridor. Hematological/Lymphatic/Immunilogical: No cervical lymphadenopathy. Cardiovascular: Normal rate, regular rhythm.  No murmurs, rubs, or gallops.  Respiratory: Normal respiratory effort without tachypnea nor retractions. Breath sounds are clear and equal bilaterally. No wheezes/rales/rhonchi. Gastrointestinal: Soft and non tender. No rebound. No guarding.  Genitourinary: Deferred Musculoskeletal: Normal range of motion in all extremities. No lower extremity edema. Neurologic:  Normal speech and language. No gross focal neurologic deficits are appreciated.  Skin:  Skin is warm, dry and intact. No rash noted. Psychiatric: Mood and affect are normal. Speech and behavior are normal. Patient exhibits appropriate insight and judgment.  ____________________________________________    LABS (pertinent positives/negatives)  Trop <0.03 x 2 CBC WBC: 10.1 Hgb: 12.3 Plt: 330 BMP WNL except: Chl 99 Glu 122 ____________________________________________   EKG  I, Nance Pear, attending physician, personally viewed and  interpreted this EKG  EKG Time: 0940 Rate: 78 Rhythm: sinus rhythm Axis: normal Intervals: qtc 433 QRS: IVCD, q waves V1 ST changes: no st elevation Impression: abnormal ekg   ____________________________________________    RADIOLOGY  CXR No acute disease  ____________________________________________   PROCEDURES  Procedures  ____________________________________________   INITIAL IMPRESSION / ASSESSMENT AND PLAN / ED COURSE  Pertinent labs & imaging results that were available during my care of the patient were reviewed by me and considered in my medical decision making (see chart for details).  patient presented to the emergency department today because of concerns for chest pain around side of his AICD. Patient states that the pain has been improving since it started. Will check blood work including  troponin. If initial troponin negative would anticipate second troponin. ----------------------------------------- 10:56 AM on 01/30/2017 -----------------------------------------   OBSERVATION CARE: This patient is being placed under observation care for the following reasons: Chest pain with repeat testing to rule out ischemia   ----------------------------------------- 11:55 AM on 01/30/2017 -----------------------------------------  Patient states that his pain is now completely gone. I updated patient on results and that we are waiting on interrogation and second troponin.  ----------------------------------------- 3:15 PM on 01/30/2017 -----------------------------------------   END OF OBSERVATION STATUS: After an appropriate period of observation, this patient is being discharged due to the following reason(s):  2 negative troponins. No return of pain. Discussed with patient interrogation of AICD/pacemaker. Unfortunately the medtronic tech would not be arriving for another two hours. Patient felt comfortable going home and following up as an outpatient. Discussed return precautions.    ____________________________________________   FINAL CLINICAL IMPRESSION(S) / ED DIAGNOSES  Final diagnoses:  Nonspecific chest pain     Note: This dictation was prepared with Dragon dictation. Any transcriptional errors that result from this process are unintentional     Nance Pear, MD 01/30/17 [phone]

## 2017-01-30 NOTE — ED Notes (Signed)
Pacemaker/ICD interrogated per Dr. Archie Balboa

## 2017-02-02 ENCOUNTER — Encounter: Payer: Self-pay | Admitting: Emergency Medicine

## 2017-02-02 ENCOUNTER — Emergency Department: Payer: Medicare Other

## 2017-02-02 ENCOUNTER — Emergency Department
Admission: EM | Admit: 2017-02-02 | Discharge: 2017-02-02 | Disposition: A | Discharge status: Home / Self Care | Payer: Medicare Other | Attending: Emergency Medicine | Admitting: Emergency Medicine

## 2017-02-02 DIAGNOSIS — F17210 Nicotine dependence, cigarettes, uncomplicated: Secondary | ICD-10-CM | POA: Diagnosis not present

## 2017-02-02 DIAGNOSIS — Z950 Presence of cardiac pacemaker: Secondary | ICD-10-CM | POA: Diagnosis not present

## 2017-02-02 DIAGNOSIS — I10 Essential (primary) hypertension: Secondary | ICD-10-CM | POA: Insufficient documentation

## 2017-02-02 DIAGNOSIS — Z7901 Long term (current) use of anticoagulants: Secondary | ICD-10-CM | POA: Insufficient documentation

## 2017-02-02 DIAGNOSIS — Z7984 Long term (current) use of oral hypoglycemic drugs: Secondary | ICD-10-CM | POA: Diagnosis not present

## 2017-02-02 DIAGNOSIS — I2510 Atherosclerotic heart disease of native coronary artery without angina pectoris: Secondary | ICD-10-CM | POA: Diagnosis not present

## 2017-02-02 DIAGNOSIS — J449 Chronic obstructive pulmonary disease, unspecified: Secondary | ICD-10-CM | POA: Diagnosis not present

## 2017-02-02 DIAGNOSIS — Z7982 Long term (current) use of aspirin: Secondary | ICD-10-CM | POA: Diagnosis not present

## 2017-02-02 DIAGNOSIS — Z79899 Other long term (current) drug therapy: Secondary | ICD-10-CM | POA: Insufficient documentation

## 2017-02-02 DIAGNOSIS — R109 Unspecified abdominal pain: Secondary | ICD-10-CM | POA: Insufficient documentation

## 2017-02-02 DIAGNOSIS — E119 Type 2 diabetes mellitus without complications: Secondary | ICD-10-CM | POA: Insufficient documentation

## 2017-02-02 LAB — COMPREHENSIVE METABOLIC PANEL
ALBUMIN: 4.2 g/dL (ref 3.5–5.0)
ALK PHOS: 102 U/L (ref 38–126)
ALT: 16 U/L — ABNORMAL LOW (ref 17–63)
AST: 28 U/L (ref 15–41)
Anion gap: 11 (ref 5–15)
BILIRUBIN TOTAL: 0.5 mg/dL (ref 0.3–1.2)
BUN: 8 mg/dL (ref 6–20)
CALCIUM: 9.1 mg/dL (ref 8.9–10.3)
CO2: 25 mmol/L (ref 22–32)
CREATININE: 0.69 mg/dL (ref 0.61–1.24)
Chloride: 100 mmol/L — ABNORMAL LOW (ref 101–111)
GFR calc Af Amer: >60 mL/min (ref >60)
Glucose, Bld: 199 mg/dL — ABNORMAL HIGH (ref 65–99)
POTASSIUM: 4.5 mmol/L (ref 3.5–5.1)
Sodium: 136 mmol/L (ref 135–145)
TOTAL PROTEIN: 8.1 g/dL (ref 6.5–8.1)

## 2017-02-02 LAB — CBC
HEMATOCRIT: 37.1 % — AB (ref 40.0–52.0)
Hemoglobin: 12.2 g/dL — ABNORMAL LOW (ref 13.0–18.0)
MCH: 27.8 pg (ref 26.0–34.0)
MCHC: 32.8 g/dL (ref 32.0–36.0)
MCV: 84.6 fL (ref 80.0–100.0)
PLATELETS: 309 10*3/uL (ref 150–440)
RBC: 4.38 MIL/uL — ABNORMAL LOW (ref 4.40–5.90)
RDW: 18.4 % — AB (ref 11.5–14.5)
WBC: 12.4 10*3/uL — ABNORMAL HIGH (ref 3.8–10.6)

## 2017-02-02 LAB — URINALYSIS, COMPLETE (UACMP) WITH MICROSCOPIC
BACTERIA UA: NONE SEEN
BILIRUBIN URINE: NEGATIVE
GLUCOSE, UA: NEGATIVE mg/dL
Hgb urine dipstick: NEGATIVE
KETONES UR: NEGATIVE mg/dL
LEUKOCYTES UA: NEGATIVE
NITRITE: NEGATIVE
PH: 6 (ref 5.0–8.0)
Protein, ur: NEGATIVE mg/dL
SPECIFIC GRAVITY, URINE: 1.006 (ref 1.005–1.030)
Squamous Epithelial / LPF: NONE SEEN

## 2017-02-02 LAB — LIPASE, BLOOD: LIPASE: 21 U/L (ref 11–51)

## 2017-02-02 LAB — TROPONIN I: Troponin I: <0.03 ng/mL (ref <0.03)

## 2017-02-02 LAB — LACTIC ACID, PLASMA: LACTIC ACID, VENOUS: 1 mmol/L (ref 0.5–1.9)

## 2017-02-02 MED ORDER — ONDANSETRON HCL 4 MG/2ML IJ SOLN
4.0000 mg | Freq: Once | INTRAMUSCULAR | Status: AC
  Administered 2017-02-02: 4 mg via INTRAVENOUS
  Filled 2017-02-02: qty 2

## 2017-02-02 MED ORDER — IOPAMIDOL (ISOVUE-300) INJECTION 61%
100.0000 mL | Freq: Once | INTRAVENOUS | Status: AC | PRN
  Administered 2017-02-02: 100 mL via INTRAVENOUS

## 2017-02-02 MED ORDER — SODIUM CHLORIDE 0.9 % IV BOLUS (SEPSIS)
1000.0000 mL | Freq: Once | INTRAVENOUS | Status: AC
  Administered 2017-02-02: 1000 mL via INTRAVENOUS

## 2017-02-02 MED ORDER — MORPHINE SULFATE (PF) 2 MG/ML IV SOLN
2.0000 mg | Freq: Once | INTRAVENOUS | Status: AC
Start: 2017-02-02 — End: 2017-02-02
  Administered 2017-02-02: 2 mg via INTRAVENOUS
  Filled 2017-02-02: qty 1

## 2017-02-02 MED ORDER — AZITHROMYCIN 250 MG PO TABS: ORAL_TABLET | ORAL | 0 refills | Status: AC

## 2017-02-02 MED ORDER — IPRATROPIUM-ALBUTEROL 0.5-2.5 (3) MG/3ML IN SOLN
3.0000 mL | Freq: Once | RESPIRATORY_TRACT | Status: AC
  Administered 2017-02-02: 3 mL via RESPIRATORY_TRACT
  Filled 2017-02-02: qty 3

## 2017-02-02 MED ORDER — IOPAMIDOL (ISOVUE-300) INJECTION 61%
30.0000 mL | Freq: Once | INTRAVENOUS | Status: AC
  Administered 2017-02-02: 30 mL via ORAL

## 2017-02-02 NOTE — ED Provider Notes (Signed)
[facility] Emergency Department Provider Note   ____________________________________________   First MD Initiated Contact with Patient 02/02/17 [phone]     (approximate)  I have reviewed the triage vital signs and the nursing notes.   HISTORY  Chief Complaint Abdominal Pain    HPI John Frank is a 61 y.o. male brought to the ED from home with a chief complaint of abdominal pain.[name] has a history of CAD, diabetes, COPD, hypertension who finished antibiotics 2 weeks ago for community-acquired pneumonia. Since that time he has maintained a cough but states overall he was feeling better until 2 days ago when he began to experience abdominal pain. Describes pain in his umbilicus and epigastrium, worse with coughing. Denies associated fever, chills, chest pain, shortness of breath, nausea, vomiting, diarrhea, testicular pain or swelling.. Denies recent travel or trauma.   Past Medical History:  Diagnosis Date  . COPD (chronic obstructive pulmonary disease) ([place])   . Diabetes ([place])   . GERD (gastroesophageal reflux disease)   . Heart attack ([place])   . Hyperlipidemia   . Hypertension   . Presence of permanent cardiac pacemaker   . Shortness of breath dyspnea   . Sleep apnea     Patient Active Problem List   Diagnosis Date Noted  . Faintness 01/11/2016  . Obesity 01/01/2016  . Tobacco abuse 04/06/2015  . COPD exacerbation ([place]) 12/24/2014  . Hypertension, accelerated with heart disease, without CHF 11/03/2014  . Sleep apnea 10/26/2014  . CAD (coronary artery disease) 10/26/2014  . GERD (gastroesophageal reflux disease) 10/26/2014  . Polyp of colon 10/26/2014  . Diabetes mellitus without complication ([place]) 96/08/5407  . Hyperlipidemia 10/26/2014  . COPD, severe ([place]) 10/26/2014  . Status cardiac pacemaker 10/26/2014  . Lesion of nasal septum 10/26/2014    Past Surgical History:  Procedure Laterality Date  . CARDIAC SURGERY     defibrillator  and pacemaker  . ESOPHAGOGASTRODUODENOSCOPY N/A 09/19/2014   Procedure: ESOPHAGOGASTRODUODENOSCOPY (EGD);  Surgeon: Lucilla Lame, MD;  Location: [facility] ENDOSCOPY;  Service: Endoscopy;  Laterality: N/A;    Prior to Admission medications   Medication Sig Start Date End Date Taking? Authorizing Provider  aspirin EC 81 MG tablet Take 81 mg by mouth daily.    [provider]  atorvastatin (LIPITOR) 40 MG tablet TAKE 1 TABLET (40 MG TOTAL) BY MOUTH DAILY. 09/08/16   Kathrine Haddock, NP  benzonatate (TESSALON PERLES) 100 MG capsule Take 1 capsule (100 mg total) by mouth 3 (three) times daily as needed for cough (Take 1-2 per dose). 10/18/16   Menshew, Dannielle Karvonen, PA-C  budesonide-formoterol (SYMBICORT) 160-4.5 MCG/ACT inhaler Inhale 2 puffs into the lungs 2 (two) times daily. 06/15/15   Kathrine Haddock, NP  buPROPion (WELLBUTRIN) 75 MG tablet Take 75 mg by mouth daily. 03/26/16 03/26/17  [provider]  chlorpheniramine-HYDROcodone (TUSSIONEX PENNKINETIC ER) 10-8 MG/5ML SUER Take 5 mLs by mouth every 12 (twelve) hours as needed. 12/18/16   Gregor Hams, MD  clopidogrel (PLAVIX) 75 MG tablet TAKE 1 TABLET (75 MG TOTAL) BY MOUTH DAILY. 12/08/16   Kathrine Haddock, NP  enalapril (VASOTEC) 5 MG tablet TAKE 1 TABLET (5 MG TOTAL) BY MOUTH DAILY. NEED F/U APPT FOR ADDITIONAL REFILLS 08/07/16   Kathrine Haddock, NP  famotidine (PEPCID) 40 MG tablet Take 1 tablet (40 mg total) by mouth daily. 09/08/16   Kathrine Haddock, NP  hydrocortisone 1 % ointment Apply 1 application topically 2 (two) times daily. 10/18/16   Menshew, Dannielle Karvonen,  PA-C  ipratropium-albuterol (DUONEB) 0.5-2.5 (3) MG/3ML SOLN Take 3 mLs by nebulization every 6 (six) hours as needed (wheezing; shortness of breath).    [provider]  JANUVIA 100 MG tablet TAKE 1 TABLET (100 MG TOTAL) BY MOUTH DAILY. 09/08/16   Kathrine Haddock, NP  metFORMIN (GLUCOPHAGE) 1000 MG tablet TAKE 1 TABLET (1,000 MG TOTAL) BY MOUTH 2 (TWO) TIMES DAILY  WITH A MEAL. 09/12/16   Kathrine Haddock, NP  metoprolol succinate (TOPROL-XL) 50 MG 24 hr tablet TAKE 1 TABLET (50 MG TOTAL) BY MOUTH DAILY. TAKE WITH OR IMMEDIATELY FOLLOWING A MEAL. 01/19/17   Guadalupe Maple, MD  nitroGLYCERIN (NITROSTAT) 0.4 MG SL tablet Place 0.4 mg under the tongue every 5 (five) minutes as needed for chest pain.    [provider]  ONE TOUCH ULTRA TEST test strip USE TO TEST BLOOD SUGAR ONCE DAILY 07/18/15   [provider]  ONE TOUCH ULTRA TEST test strip USE TO TEST BLOOD SUGAR ONCE DAILY 10/07/16   Johnson, Megan P, DO  pantoprazole (PROTONIX) 40 MG tablet TAKE 1 TABLET (40 MG TOTAL) BY MOUTH EVERY MORNING. 12/08/16   Kathrine Haddock, NP    Allergies Patient has no known allergies.  Family History  Problem Relation Age of Onset  . Diabetes Mother   . Heart attack Father 3    Social History Social History  Substance Use Topics  . Smoking status: Current Every Day Smoker    Packs/day: 0.50    Years: 40.00    Types: Cigarettes  . Smokeless tobacco: Former Systems developer  . Alcohol use No    Review of Systems  Constitutional: No fever/chills. Eyes: No visual changes. ENT: No sore throat. Cardiovascular: Denies chest pain. Respiratory: positive for nonproductive cough. Denies shortness of breath. Gastrointestinal: positive for abdominal pain.  No nausea, no vomiting.  No diarrhea.  No constipation. Genitourinary: Negative for dysuria. Musculoskeletal: Negative for back pain. Skin: Negative for rash. Neurological: Negative for headaches, focal weakness or numbness.   ____________________________________________   PHYSICAL EXAM:  VITAL SIGNS: ED Triage Vitals  Enc Vitals Group     BP 02/02/17 0325 (!) 142/61     Pulse Rate 02/02/17 0325 82     Resp 02/02/17 0325 (!) 22     Temp 02/02/17 0325 (!) 97.5 F (36.4 C)     Temp Source 02/02/17 0325 Oral     SpO2 02/02/17 0325 98 %     Weight 02/02/17 0322 155 lb (70.3 kg)     Height 02/02/17  0322 5\' 4"  (1.626 m)     Head Circumference --      Peak Flow --      Pain Score 02/02/17 0322 8     Pain Loc --      Pain Edu? --      Excl. in [place]? --     Constitutional: Alert and oriented. Well appearing and in mild acute distress. Eyes: Conjunctivae are normal. PERRL. EOMI. Head: Atraumatic. Nose: No congestion/rhinnorhea. Mouth/Throat: Mucous membranes are moist.  Oropharynx non-erythematous. Neck: No stridor.   Cardiovascular: Normal rate, regular rhythm. Grossly normal heart sounds.  Good peripheral circulation. Respiratory: Normal respiratory effort.  No retractions. Lungs with scattered rhonchi and wheezing. Gastrointestinal: Soft and mildly tender to palpation umbilicus without rebound or guarding. Small umbilical hernia noted. No distention. No abdominal bruits. No CVA tenderness. Musculoskeletal: No lower extremity tenderness nor edema.  No joint effusions. Neurologic:  Normal speech and language. No gross focal neurologic deficits are  appreciated.  Skin:  Skin is warm, dry and intact. No rash noted. Psychiatric: Mood and affect are normal. Speech and behavior are normal.  ____________________________________________   LABS (all labs ordered are listed, but only abnormal results are displayed)  Labs Reviewed  COMPREHENSIVE METABOLIC PANEL - Abnormal; Notable for the following:       Result Value   Chloride 100 (*)    Glucose, Bld 199 (*)    ALT 16 (*)    All other components within normal limits  CBC - Abnormal; Notable for the following:    WBC 12.4 (*)    RBC 4.38 (*)    Hemoglobin 12.2 (*)    HCT 37.1 (*)    RDW 18.4 (*)    All other components within normal limits  LIPASE, BLOOD  TROPONIN I  URINALYSIS, COMPLETE (UACMP) WITH MICROSCOPIC  LACTIC ACID, PLASMA  LACTIC ACID, PLASMA   ____________________________________________  EKG  ED ECG REPORT I, SUNG,JADE J, the attending physician, personally viewed and interpreted this ECG.   Date:  02/02/2017  EKG Time: 0320  Rate: 85  Rhythm: normal EKG, normal sinus rhythm  Axis: normal  Intervals:none  ST&T Change: nonspecific  ____________________________________________  RADIOLOGY  Dg Chest 2 View  Result Date: 02/02/2017 CLINICAL DATA:  Shortness of breath.  Cough. EXAM: CHEST  2 VIEW COMPARISON:  Most recent chest radiograph 3 days prior 01/30/2017. Chest CT 05/01/2015 FINDINGS: Left-sided pacemaker remains in place. Unchanged heart size and mediastinal contours. Mild hyperinflation and bronchial thickening. No confluent airspace disease. No pulmonary edema, pneumothorax or pleural fluid. IMPRESSION: Increased bronchial thickening from prior exam suggesting acute bronchitis, may be infectious or inflammatory. Chronic hyperinflation. Electronically Signed   By: Jeb Levering M.D.   On: 02/02/2017 04:16    ____________________________________________   PROCEDURES  Procedure(s) performed: None  Procedures  Critical Care performed: No  ____________________________________________   INITIAL IMPRESSION / ASSESSMENT AND PLAN / ED COURSE  Pertinent labs & imaging results that were available during my care of the patient were reviewed by me and considered in my medical decision making (see chart for details).  61 year old male with diabetes, hypertension, COPD who presents with abdominal pain, nonproductive cough, wheezing/rhonchi on lung exam. Differential diagnosis includes, but is not limited to, biliary disease (biliary colic, acute cholecystitis, cholangitis, choledocholithiasis, etc), intrathoracic causes for epigastric abdominal pain including ACS, COPD exacerbation, recurrent pneumonia, gastritis, duodenitis, pancreatitis, small bowel or large bowel obstruction, abdominal aortic aneurysm, hernia, and gastritis. laboratory results notable for mild leukocytosis. Will add lactate, check urinalysis, obtain CT abdomen/pelvis to evaluate for intra-abdominal etiology of  patient's pain. Will initiate IV fluid resuscitation, IV analgesia/antiemetic; administer DuoNeb.  Clinical Course as of Feb 02 701  Mon Feb 02, 2017  0702 Lung sounds improved after nebulizer treatment. Patient working on oral contrast. Laughing and joking with nursing staff. Care and disposition transferred to Dr. Kerman Passey pending results of lactic acid, urinalysis and CT scan.  [JS]    Clinical Course User Index [JS] Paulette Blanch, MD     ____________________________________________   FINAL CLINICAL IMPRESSION(S) / ED DIAGNOSES  Final diagnoses:  Chronic obstructive pulmonary disease, unspecified COPD type ([place])  Abdominal pain, unspecified abdominal location      NEW MEDICATIONS STARTED DURING THIS VISIT:  New Prescriptions   No medications on file     Note:  This document was prepared using Dragon voice recognition software and may include unintentional dictation errors.    Paulette Blanch, MD 02/02/17 [phone]

## 2017-02-02 NOTE — ED Triage Notes (Addendum)
Patient brought in by ems form home. Patient with complaint of upper abdominal pain since Saturday. Patient reports that the pain had been intermittent but this morning it has become constant. Patient denies nausea or vomiting. Patient with productive cough times two weeks. Patient was diagnosed pneumonia and has completed his antibiotics.

## 2017-02-02 NOTE — ED Provider Notes (Signed)
-----------------------------------------   8:41 AM on 02/02/2017 -----------------------------------------  Patient's labs have resulted largely within normal limits besides a slight leukocytosis. CT scan of the abdomen shows possible residual pneumonia but no other acute abnormality identified. We will cover with a 5 day course of Zithromax. Overall the patient appears very well, no distress. I discussed results with the patient, he is ready to leave the emergency department. Patient did briefly become hypoxic in the upper 80s, however he is able to maintain a room air saturation in the low to mid 90s, which I suspect is likely baseline for the patient. We will discharge patient home with PCP follow-up this week.   Harvest Dark, MD 02/02/17 [phone]

## 2017-02-02 NOTE — ED Notes (Signed)
Pt has COPD and O2 has been running 88-95% depending on patient activity. Pt is in NAD when satting 88%. Per Dr. Kerman Passey, no O2 supplementation needed while pt is in no distress.

## 2017-02-02 NOTE — ED Notes (Signed)
Patient transported to X-ray 

## 2017-02-02 NOTE — ED Notes (Signed)
Patient transported to CT 

## 2017-02-02 NOTE — ED Notes (Signed)
Pt alert and oriented X4, active, cooperative, pt in NAD. RR even and unlabored, color WNL.  Pt informed to return if any life threatening symptoms occur.  Discharge and followup instructions reviewed. Pt does not wear oxygen at home, pt in NAD distress, RR even and unlabored. Nonproductive cough present.

## 2017-02-07 ENCOUNTER — Other Ambulatory Visit: Payer: Self-pay | Admitting: Unknown Physician Specialty

## 2017-03-08 ENCOUNTER — Other Ambulatory Visit: Payer: Self-pay | Admitting: Unknown Physician Specialty

## 2017-03-09 NOTE — Telephone Encounter (Signed)
Can't be refilled per your protocol. By [facility]

## 2017-03-11 ENCOUNTER — Other Ambulatory Visit: Payer: Self-pay | Admitting: Unknown Physician Specialty

## 2017-03-11 NOTE — Telephone Encounter (Signed)
It looks like this pt. Has not been seen in the practice in the past year.

## 2017-03-11 NOTE — Telephone Encounter (Signed)
John Frank, it looks like this patient has established at another practice.

## 2017-04-10 ENCOUNTER — Other Ambulatory Visit: Payer: Self-pay | Admitting: Unknown Physician Specialty

## 2017-04-28 ENCOUNTER — Encounter: Payer: Self-pay | Admitting: Physical Therapy

## 2017-04-28 ENCOUNTER — Ambulatory Visit: Payer: Medicare Other | Attending: Neurology | Admitting: Physical Therapy

## 2017-04-28 ENCOUNTER — Other Ambulatory Visit: Payer: Self-pay

## 2017-04-28 DIAGNOSIS — R42 Dizziness and giddiness: Secondary | ICD-10-CM | POA: Diagnosis not present

## 2017-04-28 DIAGNOSIS — Z9181 History of falling: Secondary | ICD-10-CM | POA: Diagnosis present

## 2017-04-28 DIAGNOSIS — R2681 Unsteadiness on feet: Secondary | ICD-10-CM | POA: Diagnosis present

## 2017-04-28 NOTE — Therapy (Signed)
[facility] SERVICES [address] [place], [place], [address] Phone: [phone]   Fax:  [phone]  Physical Therapy Evaluation  Patient Details  Name: John Frank MRN: [id number] Date of Birth: 1956-05-04 Referring Provider: Dr. Brigitte Pulse   Encounter Date: 04/28/2017  PT End of Session - 04/28/17 1307    Visit Number  1    Date for PT Re-Evaluation  06/23/17    PT Start Time  0107       Past Medical History:  Diagnosis Date  . COPD (chronic obstructive pulmonary disease) ([place])   . Diabetes ([place])   . GERD (gastroesophageal reflux disease)   . Heart attack ([place])   . Hyperlipidemia   . Hypertension   . Presence of permanent cardiac pacemaker   . Shortness of breath dyspnea   . Sleep apnea     Past Surgical History:  Procedure Laterality Date  . CARDIAC SURGERY     defibrillator and pacemaker  . ESOPHAGOGASTRODUODENOSCOPY N/A 09/19/2014   Procedure: ESOPHAGOGASTRODUODENOSCOPY (EGD);  Surgeon: Lucilla Lame, MD;  Location: [facility] ENDOSCOPY;  Service: Endoscopy;  Laterality: N/A;    There were no vitals filed for this visit.   Subjective Assessment - 04/28/17 1306    Subjective  Patient states he had an episode of shaking this morning. Reports he is most concerned about falling.     Pertinent History  Patient is a difficult historian and the accuracies of his responses were inconsistent. Patient initially unable to report how long he has been experiencing episodes of dizziness and episodes of shaking. Then, patient reported he has been having episodes of dizziness that have been going on for 5-6 years or more. Patient reports no difference in his symptoms of dizziness. Patient reports that his episodes of shaking began about a year ago, but states he is not sure when exactly it started. Patient reports the episodes of shaking have gotten worse. Patient's wife was also unable to report specifically how long the patient has been  experiencing these symptoms. Patient reports he does not have a history of seizures, but his medical record does report history of seizures. Dr. Raul Del note indicated patient has been having syncopal spells concerning for seizures. Per, medical record Dr. Brigitte Pulse recommended an EEG test. Patient reports he has not had the EEG test and he has not been contacted about scheduling the test yet. Encouraged patient to call Dr. Raul Del office to inquire about the EEG test. Patient reports he has been getting spontaneous episodes where his whole body will start shaking and that he will fall unless he holds on for support. The episodes of whole body shaking have been occurring about twice a month. Patient states he had an episode of shaking this morning where he had to hold onto the table to prevent a fall. He reports that has had these episodes occur in sitting, standing, and lying down. Patient states he has never had an episode while driving. Patient states he had an episode of shaking and he feel a few weeks ago onto a glass coffee. Patient's wife reports that she has witnessed these episodes and that the patient does not have a loss of consciousness, but patient reports he does have losses of consciousness. Patient reports he has also been having some dizziness at times. Patient reports dizziness occurs about once a month and that he experiences lightheadedness and occasional vertigo. Patient states he has been having headaches the last few months and he takes Tylenol  which helps. Patient reports when he has an episode of dizziness, it does not occur at the same time as his headaches.  Patient reports he is most concerned about falling.    Diagnostic tests  per medical record, CT Head 03/16/2017 showed no acute intracranial abnormality    Patient Stated Goals  to decrease falls    Currently in Pain?  No/denies         [facility] PT Assessment - 04/28/17 1355      Assessment   Medical Diagnosis  Imbalance    Referring  Provider  Dr. Brigitte Pulse    Prior Therapy  no prior vestibular therapy      Precautions   Precautions  Fall      Restrictions   Weight Bearing Restrictions  No      Balance Screen   Has the patient fallen in the past 6 months  Yes    How many times?  5    Has the patient had a decrease in activity level because of a fear of falling?   Yes    Is the patient reluctant to leave their home because of a fear of falling?   Yes      [address]  Private residence    Living Arrangements  Spouse/significant other    Available Help at Discharge  Family    Type of [place] Access  Level entry    [place]  One level      Prior Function   Level of Independence  Independent with community mobility without device      Standardized Balance Assessment   Standardized Balance Assessment  Dynamic Gait Index      Dynamic Gait Index   Level Surface  Normal    Change in Gait Speed  Normal    Gait with Horizontal Head Turns  Normal    Gait with Vertical Head Turns  Mild Impairment    Gait and Pivot Turn  Normal    Step Over Obstacle  Mild Impairment    Step Around Obstacles  Normal    Steps  Mild Impairment    Total Score  21          VESTIBULAR AND BALANCE EVALUATION   HISTORY:  Subjective history of current problem: Patient is a difficult historian and the accuracies of his responses were inconsistent. Patient initially unable to report how long he has been experiencing episodes of dizziness and episodes of shaking. Then, patient reported he has been having episodes of dizziness that have been going on for 5-6 years or more. Patient reports no difference in his symptoms of dizziness. Patient reports that his episodes of shaking began about a year ago, but states he is not sure when exactly it started. Patient reports the episodes of shaking have gotten worse. Patient's wife was also unable to report specifically how long the patient has been  experiencing these symptoms. Patient reports he does not have a history of seizures, but his medical record does report history of seizures. Dr. Raul Del note indicated patient has been having syncopal spells concerning for seizures. Per, medical record Dr. Brigitte Pulse recommended an EEG test. Patient reports he has not had the EEG test and he has not been contacted about scheduling the test yet. Encouraged patient to call Dr. Raul Del office to inquire about the EEG test.  Patient reports he has been getting spontaneous episodes where his whole body will start shaking  and that he will fall unless he holds on for support. The episodes of whole body shaking have been occurring about twice a month. Patient states he had an episode of shaking this morning where he had to hold onto the table to prevent a fall. He reports that has had these episodes occur in sitting, standing, and lying down. Patient states he has never had an episode while driving. Patient states he had an episode of shaking and he feel a few weeks ago onto a glass coffee. Patient's wife reports that she has witnessed these episodes and that the patient does not have a loss of consciousness, but patient reports he does have losses of consciousness.  Patient reports he has also been having some dizziness at times. Patient reports dizziness occurs about once a month and that he experiences lightheadedness and occasional vertigo. Patient states he has been having headaches the last few months and he takes Tylenol which helps. Patient reports when he has an episode of dizziness, it does not occur at the same time as his headaches.  Patient reports he is most concerned about falling. Per medical record, patient has a remote history of head injury when he fell out of a crib as a baby.   Description of dizziness: reports  Lightheadedness and occasional vertigo Frequency: once a month Duration: seconds Symptom nature: spontaneous,variable  Provocative Factors:  nothing that he can recall Easing Factors: sitting still  Falls (yes/no): yes Number of falls in past 6 months: 4 or 5; patient reports near misses occur daily where he has to reach for support.  Prior Functional Level: community ambulator without AD.   Auditory complaints (tinnitus, pain, drainage): reports he used to have ringing in the ears but Dr. Leeanne Mannan removed ear wax and it improved the ringing.  Vision (last eye exam, diplopia, recent changes): glasses, reports his eyes get a burning sensation at times; reports his last eye exam was about 4 years ago. Patient initially denied double vision, but then when attempting to perform VOR exercise, he reported double vision. Then during the evaluation, the patient later reported that at times he has noticed that when he is talking to his dog, he experiences double vision. Encouraged patient to see his eye doctor.  Current Symptoms: (dysarthria, dysphagia, drop attacks, bowel and bladder changes, recent weight loss/gain)    Review of systems negative for red flags.     EXAMINATION    SOMATOSENSORY:  Any N & T in extremities or weakness: denies      COORDINATION: Finger to Nose:   Normal Past Pointing:   Normal  MUSCULOSKELETAL SCREEN: Cervical Spine ROM: Patient with [facility] active cervical neck ROM flexion, rotation L and R and extension without pain.   Gait:  Patient arrives to clinic ambulating without AD. Patient ambulates with slow cadence, with decreased arm swing and decreased step height. Patient with uneven steppage noted at times. No veering with ambulation noted. A few times he needed to take a small extra step when he did a quick turn but no LOB.  Scanning of visual environment with gait is: fair  Balance: Patient presents with difficulties with ambulation with vertical head turns, narrow BOS, uneven surfaces and with eyes closed activities.   POSTURAL CONTROL TESTS:  Clinical Test of Sensory Interaction for Balance     (CTSIB):  CONDITION TIME STRATEGY SWAY  Eyes open, firm surface 30 seconds    Eyes closed, firm surface 30 seconds ankle +1  Eyes open, foam surface  30 seconds ankle +2  Eyes closed, foam surface 5 seconds Ankle, reach +4    OCULOMOTOR / VESTIBULAR TESTING:  Oculomotor Exam- Room Light  Normal Abnormal Comments  Ocular Alignment N    Ocular ROM N    Spontaneous Nystagmus N    End-Gaze Nystagmus N    Smooth Pursuit  Abn Multiple saccadic jumps to acquire target  Saccades  Abn Denies dizziness; noted multiple hypometric saccades in L, R and inferior testing fields  VOR  Abn Patient denied dizziness and blurring of target, but does report that he has double vision the entire time while performing VOR exercise at both near and far distances  VOR Cancellation N    Left Head Thrust N    Right Head Thrust  Abn Denies dizziness; multiple corrective saccades with right head thrust testing  Head Shaking Nystagmus N     Patient has double vision targets near and far.   BPPV TESTS: Will plan on testing next session secondary to time constraints this date.   FUNCTIONAL OUTCOME MEASURES:  Results Comments  DHI 22/100 low perception of handicap  ABC Scale 30% High falls risk; in need of intervention  DGI 21/24 Safe for community ambulation     Neuromuscular Re-education:  VOR X 1 exercise:  Demonstrated and educated as to VOR X1.  Patient performed VOR X 1 horizontal in sitting 3 reps of 30 seconds each with verbal cues for technique.  Patient reports double vision of the target at slow speeds and at near and farther distances.  Patient denies dizziness.     PT Education - 04/28/17 1306    Education provided  Yes    Education Details  discussed plan of care; educated as to VOR for HEP; recommended that patient go to his eye doctor to discuss his double vision and to get an annual eye examination as patient reports it has been 4 years since his last eye exam.     Person(s) Educated   Patient;Spouse    Methods  Explanation;Demonstration;Verbal cues;Handout    Comprehension  Verbalized understanding;Returned demonstration;Verbal cues required;Need further instruction       PT Short Term Goals - 04/29/17 1333      PT SHORT TERM GOAL #1   Title  Patient will be able to perform home program independently for self-management.    Time  4    Period  Weeks    Status  New    Target Date  05/26/17         Plan - 04/28/17 1307    Clinical Impression Statement  Patient is a difficult historian and the accuracies of his responses during the evaluation were inconsistent. Patient with potential indicators of both central and peripheral vestibular symtpoms. Patient identified to be having double vision when attempting to do VOR exercise this date. Encouraged patient to see his eye doctor for further follow-up and evaluation. Patient with saccadic eye movements smooth pursuit testing as well as multiple corrective saccades noted with saccades testing. Patient with corrective saccades noted with right head thrust testing. Patient demonstrates significant difficulty with eyes closed on compliant surfaces. Patient presents with listed functional deficits and is at risk for falls. Patient would benefit from PT services to address deficits and goals and to decrease falls risk.      History and Personal Factors relevant to plan of care:  age, education, multiple comorbidities-seizures, carotid sinus syndrome, MI, cardiac pacemaker, COPD    Clinical Presentation  Evolving  Clinical Decision Making  Moderate    Rehab Potential  Fair    Clinical Impairments Affecting Rehab Potential  positive indicators: motivated, family support  negative indicators: chronicity of problems, potential indicators of central findings, education     PT Frequency  1x / week    PT Duration  8 weeks    PT Treatment/Interventions  [place] Repostioning;Gait training;Stair training;Therapeutic exercise;Therapeutic  activities;Balance training;Neuromuscular re-education;Patient/family education;Vestibular    PT Next Visit Plan  review VOR, DIx-Hallpike testing, MMT and sensation testing,     PT Home Exercise Plan  VOR x 1 in sitting       Patient will benefit from skilled therapeutic intervention in order to improve the following deficits and impairments:  Decreased balance, Decreased endurance, Decreased mobility, Difficulty walking, Impaired vision/preception, Cardiopulmonary status limiting activity, Dizziness, Decreased activity tolerance  Visit Diagnosis: Dizziness and giddiness  Unsteadiness on feet  History of falling  G-Codes - 05-22-17 1336    Functional Assessment Tool Used (Outpatient Only)  clinical judgment, ABC scale, DHI, DGI    Functional Limitation  Mobility: Walking and moving around    Mobility: Walking and Moving Around Current Status [id number]  At least 20 percent but less than 40 percent impaired, limited or restricted    Mobility: Walking and Moving Around Goal Status [phone])  At least 1 percent but less than 20 percent impaired, limited or restricted        Problem List Patient Active Problem List   Diagnosis Date Noted  . Faintness 01/11/2016  . Obesity 01/01/2016  . Tobacco abuse 04/06/2015  . COPD exacerbation ([place]) 12/24/2014  . Hypertension, accelerated with heart disease, without CHF 11/03/2014  . Sleep apnea 10/26/2014  . CAD (coronary artery disease) 10/26/2014  . GERD (gastroesophageal reflux disease) 10/26/2014  . Polyp of colon 10/26/2014  . Diabetes mellitus without complication ([place]) 38/75/6433  . Hyperlipidemia 10/26/2014  . COPD, severe ([place]) 10/26/2014  . Status cardiac pacemaker 10/26/2014  . Lesion of nasal septum 10/26/2014   Lady Deutscher PT, DPT [phone] Lady Deutscher 05/22/2017, 2:28 PM  [place] [facility] [address] [place], [place], [address] Phone: [phone]   Fax:   [phone]  Name: OCTAVIA MOTTOLA MRN: [id number] Date of Birth: [birth year]

## 2017-05-08 ENCOUNTER — Other Ambulatory Visit: Payer: Self-pay

## 2017-05-08 ENCOUNTER — Encounter: Payer: Self-pay | Admitting: *Deleted

## 2017-05-08 ENCOUNTER — Emergency Department
Admission: EM | Admit: 2017-05-08 | Discharge: 2017-05-08 | Disposition: A | Discharge status: Home / Self Care | Payer: Medicare Other | Attending: Emergency Medicine | Admitting: Emergency Medicine

## 2017-05-08 DIAGNOSIS — Z7982 Long term (current) use of aspirin: Secondary | ICD-10-CM | POA: Diagnosis not present

## 2017-05-08 DIAGNOSIS — Z79899 Other long term (current) drug therapy: Secondary | ICD-10-CM | POA: Diagnosis not present

## 2017-05-08 DIAGNOSIS — R1013 Epigastric pain: Secondary | ICD-10-CM | POA: Diagnosis not present

## 2017-05-08 DIAGNOSIS — Z7984 Long term (current) use of oral hypoglycemic drugs: Secondary | ICD-10-CM | POA: Diagnosis not present

## 2017-05-08 DIAGNOSIS — J449 Chronic obstructive pulmonary disease, unspecified: Secondary | ICD-10-CM | POA: Diagnosis not present

## 2017-05-08 DIAGNOSIS — I2510 Atherosclerotic heart disease of native coronary artery without angina pectoris: Secondary | ICD-10-CM | POA: Insufficient documentation

## 2017-05-08 DIAGNOSIS — Z7901 Long term (current) use of anticoagulants: Secondary | ICD-10-CM | POA: Diagnosis not present

## 2017-05-08 DIAGNOSIS — I10 Essential (primary) hypertension: Secondary | ICD-10-CM | POA: Insufficient documentation

## 2017-05-08 DIAGNOSIS — Z950 Presence of cardiac pacemaker: Secondary | ICD-10-CM | POA: Insufficient documentation

## 2017-05-08 DIAGNOSIS — F17210 Nicotine dependence, cigarettes, uncomplicated: Secondary | ICD-10-CM | POA: Diagnosis not present

## 2017-05-08 DIAGNOSIS — E119 Type 2 diabetes mellitus without complications: Secondary | ICD-10-CM | POA: Insufficient documentation

## 2017-05-08 LAB — COMPREHENSIVE METABOLIC PANEL
ALBUMIN: 4.2 g/dL (ref 3.5–5.0)
ALT: 11 U/L — ABNORMAL LOW (ref 17–63)
ANION GAP: 9 (ref 5–15)
AST: 23 U/L (ref 15–41)
Alkaline Phosphatase: 107 U/L (ref 38–126)
BUN: 8 mg/dL (ref 6–20)
CO2: 28 mmol/L (ref 22–32)
Calcium: 9 mg/dL (ref 8.9–10.3)
Chloride: 99 mmol/L — ABNORMAL LOW (ref 101–111)
Creatinine, Ser: 1.03 mg/dL (ref 0.61–1.24)
GFR calc Af Amer: >60 mL/min (ref >60)
GFR calc non Af Amer: >60 mL/min (ref >60)
GLUCOSE: 172 mg/dL — AB (ref 65–99)
POTASSIUM: 3.9 mmol/L (ref 3.5–5.1)
SODIUM: 136 mmol/L (ref 135–145)
TOTAL PROTEIN: 8 g/dL (ref 6.5–8.1)
Total Bilirubin: 0.6 mg/dL (ref 0.3–1.2)

## 2017-05-08 LAB — CBC WITH DIFFERENTIAL/PLATELET
BASOS ABS: 0.2 10*3/uL — AB (ref 0–0.1)
Basophils Relative: 3 %
Eosinophils Absolute: 0.2 10*3/uL (ref 0–0.7)
Eosinophils Relative: 2 %
HEMATOCRIT: 36.6 % — AB (ref 40.0–52.0)
HEMOGLOBIN: 11.7 g/dL — AB (ref 13.0–18.0)
LYMPHS PCT: 12 %
Lymphs Abs: 0.9 10*3/uL — ABNORMAL LOW (ref 1.0–3.6)
MCH: 27.5 pg (ref 26.0–34.0)
MCHC: 32 g/dL (ref 32.0–36.0)
MCV: 86.1 fL (ref 80.0–100.0)
MONOS PCT: 5 %
Monocytes Absolute: 0.4 10*3/uL (ref 0.2–1.0)
NEUTROS PCT: 78 %
Neutro Abs: 5.6 10*3/uL (ref 1.4–6.5)
Platelets: 252 10*3/uL (ref 150–440)
RBC: 4.25 MIL/uL — ABNORMAL LOW (ref 4.40–5.90)
RDW: 19.1 % — AB (ref 11.5–14.5)
WBC: 7.2 10*3/uL (ref 3.8–10.6)

## 2017-05-08 LAB — LIPASE, BLOOD: Lipase: 25 U/L (ref 11–51)

## 2017-05-08 MED ORDER — FAMOTIDINE IN NACL 20-0.9 MG/50ML-% IV SOLN
20.0000 mg | Freq: Once | INTRAVENOUS | Status: AC
  Administered 2017-05-08: 20 mg via INTRAVENOUS
  Filled 2017-05-08: qty 50

## 2017-05-08 MED ORDER — GI COCKTAIL ~~LOC~~
30.0000 mL | Freq: Once | ORAL | Status: AC
  Administered 2017-05-08: 30 mL via ORAL
  Filled 2017-05-08: qty 30

## 2017-05-08 MED ORDER — FAMOTIDINE 40 MG PO TABS: 40.0000 mg | ORAL_TABLET | Freq: Every day | ORAL | 0 refills | Status: DC

## 2017-05-08 NOTE — ED Notes (Signed)

## 2017-05-08 NOTE — ED Notes (Signed)
Family with pt

## 2017-05-08 NOTE — Discharge Instructions (Signed)
Please take your gastric reflux medication as prescribed and follow-up with your primary care physician this coming week for recheck.  Return to the emergency department sooner for any new or worsening symptoms such as fevers, chills, worsening pain, if you cannot eat or drink, or for any other issues whatsoever.  It was a pleasure to take care of you today, and thank you for coming to our emergency department.  If you have any questions or concerns before leaving please ask the nurse to grab me and I'm more than happy to go through your aftercare instructions again.  If you were prescribed any opioid pain medication today such as Norco, Vicodin, Percocet, morphine, hydrocodone, or oxycodone please make sure you do not drive when you are taking this medication as it can alter your ability to drive safely.  If you have any concerns once you are home that you are not improving or are in fact getting worse before you can make it to your follow-up appointment, please do not hesitate to call 911 and come back for further evaluation.  Darel Hong, MD  Results for orders placed or performed during the hospital encounter of 05/08/17  Comprehensive metabolic panel  Result Value Ref Range   Sodium 136 135 - 145 mmol/L   Potassium 3.9 3.5 - 5.1 mmol/L   Chloride 99 (L) 101 - 111 mmol/L   CO2 28 22 - 32 mmol/L   Glucose, Bld 172 (H) 65 - 99 mg/dL   BUN 8 6 - 20 mg/dL   Creatinine, Ser 1.03 0.61 - 1.24 mg/dL   Calcium 9.0 8.9 - 10.3 mg/dL   Total Protein 8.0 6.5 - 8.1 g/dL   Albumin 4.2 3.5 - 5.0 g/dL   AST 23 15 - 41 U/L   ALT 11 (L) 17 - 63 U/L   Alkaline Phosphatase 107 38 - 126 U/L   Total Bilirubin 0.6 0.3 - 1.2 mg/dL   GFR calc non Af Amer >60 >60 mL/min   GFR calc Af Amer >60 >60 mL/min   Anion gap 9 5 - 15  CBC with Differential  Result Value Ref Range   WBC 7.2 3.8 - 10.6 K/uL   RBC 4.25 (L) 4.40 - 5.90 MIL/uL   Hemoglobin 11.7 (L) 13.0 - 18.0 g/dL   HCT 36.6 (L) 40.0 - 52.0 %   MCV 86.1 80.0 - 100.0 fL   MCH 27.5 26.0 - 34.0 pg   MCHC 32.0 32.0 - 36.0 g/dL   RDW 19.1 (H) 11.5 - 14.5 %   Platelets 252 150 - 440 K/uL   Neutrophils Relative % 78 %   Neutro Abs 5.6 1.4 - 6.5 K/uL   Lymphocytes Relative 12 %   Lymphs Abs 0.9 (L) 1.0 - 3.6 K/uL   Monocytes Relative 5 %   Monocytes Absolute 0.4 0.2 - 1.0 K/uL   Eosinophils Relative 2 %   Eosinophils Absolute 0.2 0 - 0.7 K/uL   Basophils Relative 3 %   Basophils Absolute 0.2 (H) 0 - 0.1 K/uL  Lipase, blood  Result Value Ref Range   Lipase 25 11 - 51 U/L

## 2017-05-08 NOTE — ED Triage Notes (Signed)
Pt brought in via ems from home.  Pt has abd pain above the navel for 1 week.  No n/v/d   Hx copd.  Pt alert.

## 2017-05-08 NOTE — ED Provider Notes (Signed)
[facility] Emergency Department Provider Note  ____________________________________________   First MD Initiated Contact with Patient 05/08/17 [phone]     (approximate)  I have reviewed the triage vital signs and the nursing notes.   HISTORY  Chief Complaint Abdominal Pain    HPI CORIN TILLY is a 61 y.o. male who comes to the emergency department via EMS with 1 week of moderate severity upper abdominal pain.  His pain is sharp intermittent moderate severity.  It is in his epigastrium radiating upwards.  It is associated with nausea.  He does have a dry cough.  The pain seems to be somewhat worse in the morning.  He does have a strange taste in his mouth.  Nothing in particular seems to make the pain better.  09/19/14 Endoscopy: - Normal esophagus. - Normal stomach. - Normal examined duodenum. - Random biopsies were obtained in the middle third of the esophagus.   Past Medical History:  Diagnosis Date  . COPD (chronic obstructive pulmonary disease) ([place])   . Diabetes ([place])   . GERD (gastroesophageal reflux disease)   . Heart attack ([place])   . Hyperlipidemia   . Hypertension   . Presence of permanent cardiac pacemaker   . Shortness of breath dyspnea   . Sleep apnea     Patient Active Problem List   Diagnosis Date Noted  . Faintness 01/11/2016  . Obesity 01/01/2016  . Tobacco abuse 04/06/2015  . COPD exacerbation ([place]) 12/24/2014  . Hypertension, accelerated with heart disease, without CHF 11/03/2014  . Sleep apnea 10/26/2014  . CAD (coronary artery disease) 10/26/2014  . GERD (gastroesophageal reflux disease) 10/26/2014  . Polyp of colon 10/26/2014  . Diabetes mellitus without complication ([place]) 59/93/5701  . Hyperlipidemia 10/26/2014  . COPD, severe ([place]) 10/26/2014  . Status cardiac pacemaker 10/26/2014  . Lesion of nasal septum 10/26/2014    Past Surgical History:  Procedure Laterality Date  . CARDIAC SURGERY     defibrillator and  pacemaker  . ESOPHAGOGASTRODUODENOSCOPY N/A 09/19/2014   Procedure: ESOPHAGOGASTRODUODENOSCOPY (EGD);  Surgeon: Lucilla Lame, MD;  Location: [facility] ENDOSCOPY;  Service: Endoscopy;  Laterality: N/A;    Prior to Admission medications   Medication Sig Start Date End Date Taking? Authorizing Provider  aspirin EC 81 MG tablet Take 81 mg by mouth daily.   Yes [provider]  atorvastatin (LIPITOR) 40 MG tablet TAKE 1 TABLET (40 MG TOTAL) BY MOUTH DAILY. 04/10/17  Yes Kathrine Haddock, NP  budesonide-formoterol (SYMBICORT) 160-4.5 MCG/ACT inhaler Inhale 2 puffs into the lungs 2 (two) times daily. 06/15/15  Yes Kathrine Haddock, NP  buPROPion (WELLBUTRIN) 75 MG tablet Take 75 mg by mouth daily. 03/26/16 05/08/18 Yes [provider]  clopidogrel (PLAVIX) 75 MG tablet TAKE 1 TABLET (75 MG TOTAL) BY MOUTH DAILY. 12/08/16  Yes Kathrine Haddock, NP  enalapril (VASOTEC) 5 MG tablet TAKE 1 TABLET (5 MG TOTAL) BY MOUTH DAILY. NEED F/U APPT FOR ADDITIONAL REFILLS 08/07/16  Yes Kathrine Haddock, NP  ipratropium-albuterol (DUONEB) 0.5-2.5 (3) MG/3ML SOLN Take 3 mLs by nebulization every 6 (six) hours as needed (wheezing; shortness of breath).   Yes [provider]  JANUVIA 100 MG tablet TAKE 1 TABLET (100 MG TOTAL) BY MOUTH DAILY. 04/10/17  Yes Kathrine Haddock, NP  levETIRAcetam (KEPPRA) 500 MG tablet Take 500 mg by mouth 2 (two) times daily.   Yes [provider]  metFORMIN (GLUCOPHAGE) 1000 MG tablet TAKE 1 TABLET (1,000 MG TOTAL) BY MOUTH 2 (TWO) TIMES DAILY WITH  A MEAL. 09/12/16  Yes Kathrine Haddock, NP  metoprolol succinate (TOPROL-XL) 50 MG 24 hr tablet TAKE 1 TABLET (50 MG TOTAL) BY MOUTH DAILY. TAKE WITH OR IMMEDIATELY FOLLOWING A MEAL. 01/19/17  Yes Crissman, Jeannette How, MD  nitroGLYCERIN (NITROSTAT) 0.4 MG SL tablet Place 0.4 mg under the tongue every 5 (five) minutes as needed for chest pain.   Yes [provider]  pantoprazole (PROTONIX) 40 MG tablet TAKE 1 TABLET (40 MG TOTAL) BY  MOUTH EVERY MORNING. 12/08/16  Yes Kathrine Haddock, NP  benzonatate (TESSALON PERLES) 100 MG capsule Take 1 capsule (100 mg total) by mouth 3 (three) times daily as needed for cough (Take 1-2 per dose). Patient not taking: Reported on 05/08/2017 10/18/16   Menshew, Dannielle Karvonen, PA-C  chlorpheniramine-HYDROcodone (TUSSIONEX PENNKINETIC ER) 10-8 MG/5ML SUER Take 5 mLs by mouth every 12 (twelve) hours as needed. Patient not taking: Reported on 05/08/2017 12/18/16   Gregor Hams, MD  famotidine (PEPCID) 40 MG tablet Take 1 tablet (40 mg total) by mouth daily. 05/08/17   Darel Hong, MD  hydrocortisone 1 % ointment Apply 1 application topically 2 (two) times daily. Patient not taking: Reported on 05/08/2017 10/18/16   Menshew, Dannielle Karvonen, PA-C  metoprolol succinate (TOPROL-XL) 50 MG 24 hr tablet TAKE 1 TABLET (50 MG TOTAL) BY MOUTH DAILY. TAKE WITH OR IMMEDIATELY FOLLOWING A MEAL. Patient not taking: Reported on 05/08/2017 02/09/17   Kathrine Haddock, NP  ONE [facility] ULTRA TEST test strip USE TO TEST BLOOD SUGAR ONCE DAILY 10/07/16   Park Liter P, DO    Allergies Patient has no known allergies.  Family History  Problem Relation Age of Onset  . Diabetes Mother   . Heart attack Father 64    Social History Social History   Tobacco Use  . Smoking status: Current Every Day Smoker    Packs/day: 0.50    Years: 40.00    Pack years: 20.00    Types: Cigarettes  . Smokeless tobacco: Former Network engineer Use Topics  . Alcohol use: No  . Drug use: No    Review of Systems Constitutional: No fever/chills Eyes: No visual changes. ENT: No sore throat. Cardiovascular: Denies chest pain. Respiratory: Denies shortness of breath. Gastrointestinal: Positive for abdominal pain.  Positive for nausea, no vomiting.  No diarrhea.  No constipation. Genitourinary: Negative for dysuria. Musculoskeletal: Negative for back pain. Skin: Negative for rash. Neurological: Negative for headaches, focal  weakness or numbness.   ____________________________________________   PHYSICAL EXAM:  VITAL SIGNS: ED Triage Vitals [05/08/17 0200]  Enc Vitals Group     BP (!) 144/89     Pulse Rate 96     Resp 20     Temp 98.6 F (37 C)     Temp Source Oral     SpO2 94 %     Weight 135 lb (61.2 kg)     Height 5\' 4"  (1.626 m)     Head Circumference      Peak Flow      Pain Score      Pain Loc      Pain Edu?      Excl. in [place]?     Constitutional: Alert and oriented x4 well-appearing nontoxic no diaphoresis speaks full clear sentences Eyes: PERRL EOMI. Head: Atraumatic. Nose: No congestion/rhinnorhea. Mouth/Throat: No trismus Neck: No stridor.   Cardiovascular: Normal rate, regular rhythm. Grossly normal heart sounds.  Good peripheral circulation. Respiratory: Normal respiratory effort.  No retractions. Lungs  CTAB and moving good air Gastrointestinal: Somewhat obese abdomen soft nondistended nontender no rebound or guarding no peritonitis no McBurney's tenderness negative Rovsing's no costovertebral tenderness negative Murphy's Musculoskeletal: No lower extremity edema   Neurologic:  Normal speech and language. No gross focal neurologic deficits are appreciated. Skin:  Skin is warm, dry and intact. No rash noted. Psychiatric: Mood and affect are normal. Speech and behavior are normal.    ____________________________________________   DIFFERENTIAL includes but not limited to  Appendicitis, diverticulitis, small bowel obstruction, gastric reflux, acute coronary syndrome ____________________________________________   LABS (all labs ordered are listed, but only abnormal results are displayed)  Labs Reviewed  COMPREHENSIVE METABOLIC PANEL - Abnormal; Notable for the following components:      Result Value   Chloride 99 (*)    Glucose, Bld 172 (*)    ALT 11 (*)    All other components within normal limits  CBC WITH DIFFERENTIAL/PLATELET - Abnormal; Notable for the following  components:   RBC 4.25 (*)    Hemoglobin 11.7 (*)    HCT 36.6 (*)    RDW 19.1 (*)    Lymphs Abs 0.9 (*)    Basophils Absolute 0.2 (*)    All other components within normal limits  LIPASE, BLOOD  URINALYSIS, COMPLETE (UACMP) WITH MICROSCOPIC    Lab work reviewed by me with no acute disease noted __________________________________________  EKG  ED ECG REPORT I, Darel Hong, the attending physician, personally viewed and interpreted this ECG.  Date: 05/08/2017 EKG Time:  Rate: 94 Rhythm: normal sinus rhythm QRS Axis: normal Intervals: normal ST/T Wave abnormalities: normal Narrative Interpretation: no evidence of acute ischemia.  Poor R wave progression  ____________________________________________  RADIOLOGY   ____________________________________________   PROCEDURES  Procedure(s) performed: no  Procedures  Critical Care performed: no  Observation: no ____________________________________________   INITIAL IMPRESSION / ASSESSMENT AND PLAN / ED COURSE  Pertinent labs & imaging results that were available during my care of the patient were reviewed by me and considered in my medical decision making (see chart for details).  The patient arrives hemodynamically stable with a nonischemic EKG and a benign abdominal exam along with 1 week of symptoms.  It is epigastric burning associated with a foul taste in his mouth.  I will give him a trial of a GI cocktail and famotidine and reevaluate.     ----------------------------------------- 3:24 AM on 05/08/2017 -----------------------------------------  After famotidine and a GI cocktail the patient's pain is nearly completely resolved.  His abdomen remains benign and nonsurgical and he is able to eat and drink.  I had a lengthy discussion with the patient regarding his symptoms and that I do not believe he requires a CT scan at this point.  He verbalized understanding and agreement the plan.  He understands a  follow-up with his primary care physician Dr. Doy Hutching this coming week for reevaluation.  He is discharged home in improved condition with strict return precautions. ____________________________________________   FINAL CLINICAL IMPRESSION(S) / ED DIAGNOSES  Final diagnoses:  Epigastric pain      NEW MEDICATIONS STARTED DURING THIS VISIT:  This SmartLink is deprecated. Use AVSMEDLIST instead to display the medication list for a patient.   Note:  This document was prepared using Dragon voice recognition software and may include unintentional dictation errors.     Darel Hong, MD 05/08/17 [phone]

## 2017-05-08 NOTE — ED Notes (Signed)
Report off to [employer]

## 2017-05-08 NOTE — ED Notes (Signed)
Pt unable to void at this time. 

## 2017-05-08 NOTE — ED Notes (Signed)
Pt brought in via ems from home with abd pain.  No n/v/d.  Sx for 1 week.  Pt states pain is worse today.  No dysuria. No back pain . No chest pain hx copd  Pt has intermittent sob.  md at bedside on arrival to er treatment room.  Iv started and labs sent. Marland Kitchen

## 2017-05-15 ENCOUNTER — Ambulatory Visit: Payer: Medicare Other | Attending: Neurology | Admitting: Physical Therapy

## 2017-05-19 ENCOUNTER — Ambulatory Visit: Payer: Medicare Other | Admitting: Physical Therapy

## 2017-05-25 ENCOUNTER — Other Ambulatory Visit: Payer: Self-pay

## 2017-05-25 ENCOUNTER — Inpatient Hospital Stay
Admission: EM | Admit: 2017-05-25 | Discharge: 2017-05-26 | DRG: 191 | Disposition: A | Discharge status: Home / Self Care | Payer: Medicare Other | Attending: Specialist | Admitting: Specialist

## 2017-05-25 ENCOUNTER — Emergency Department: Payer: Medicare Other

## 2017-05-25 ENCOUNTER — Encounter: Payer: Self-pay | Admitting: Emergency Medicine

## 2017-05-25 DIAGNOSIS — I5042 Chronic combined systolic (congestive) and diastolic (congestive) heart failure: Secondary | ICD-10-CM | POA: Diagnosis present

## 2017-05-25 DIAGNOSIS — Z7982 Long term (current) use of aspirin: Secondary | ICD-10-CM

## 2017-05-25 DIAGNOSIS — J441 Chronic obstructive pulmonary disease with (acute) exacerbation: Principal | ICD-10-CM | POA: Diagnosis present

## 2017-05-25 DIAGNOSIS — Z7984 Long term (current) use of oral hypoglycemic drugs: Secondary | ICD-10-CM

## 2017-05-25 DIAGNOSIS — I110 Hypertensive heart disease with heart failure: Secondary | ICD-10-CM | POA: Diagnosis not present

## 2017-05-25 DIAGNOSIS — I252 Old myocardial infarction: Secondary | ICD-10-CM | POA: Diagnosis not present

## 2017-05-25 DIAGNOSIS — I10 Essential (primary) hypertension: Secondary | ICD-10-CM | POA: Diagnosis present

## 2017-05-25 DIAGNOSIS — E119 Type 2 diabetes mellitus without complications: Secondary | ICD-10-CM | POA: Diagnosis present

## 2017-05-25 DIAGNOSIS — F17210 Nicotine dependence, cigarettes, uncomplicated: Secondary | ICD-10-CM | POA: Diagnosis present

## 2017-05-25 DIAGNOSIS — R569 Unspecified convulsions: Secondary | ICD-10-CM | POA: Diagnosis not present

## 2017-05-25 DIAGNOSIS — E785 Hyperlipidemia, unspecified: Secondary | ICD-10-CM | POA: Diagnosis present

## 2017-05-25 DIAGNOSIS — I2510 Atherosclerotic heart disease of native coronary artery without angina pectoris: Secondary | ICD-10-CM | POA: Diagnosis not present

## 2017-05-25 DIAGNOSIS — K219 Gastro-esophageal reflux disease without esophagitis: Secondary | ICD-10-CM | POA: Diagnosis present

## 2017-05-25 DIAGNOSIS — R008 Other abnormalities of heart beat: Secondary | ICD-10-CM | POA: Diagnosis not present

## 2017-05-25 DIAGNOSIS — R55 Syncope and collapse: Secondary | ICD-10-CM | POA: Diagnosis present

## 2017-05-25 DIAGNOSIS — W07XXXA Fall from chair, initial encounter: Secondary | ICD-10-CM | POA: Diagnosis present

## 2017-05-25 DIAGNOSIS — G4733 Obstructive sleep apnea (adult) (pediatric): Secondary | ICD-10-CM | POA: Diagnosis present

## 2017-05-25 DIAGNOSIS — Z95810 Presence of automatic (implantable) cardiac defibrillator: Secondary | ICD-10-CM

## 2017-05-25 DIAGNOSIS — R531 Weakness: Secondary | ICD-10-CM

## 2017-05-25 DIAGNOSIS — I429 Cardiomyopathy, unspecified: Secondary | ICD-10-CM | POA: Diagnosis present

## 2017-05-25 DIAGNOSIS — Z7902 Long term (current) use of antithrombotics/antiplatelets: Secondary | ICD-10-CM | POA: Diagnosis not present

## 2017-05-25 DIAGNOSIS — Z9119 Patient's noncompliance with other medical treatment and regimen: Secondary | ICD-10-CM

## 2017-05-25 DIAGNOSIS — Z79899 Other long term (current) drug therapy: Secondary | ICD-10-CM | POA: Diagnosis not present

## 2017-05-25 LAB — COMPREHENSIVE METABOLIC PANEL
ALK PHOS: 108 U/L (ref 38–126)
ALT: 23 U/L (ref 17–63)
ANION GAP: 8 (ref 5–15)
AST: 27 U/L (ref 15–41)
Albumin: 4.3 g/dL (ref 3.5–5.0)
BILIRUBIN TOTAL: 1.3 mg/dL — AB (ref 0.3–1.2)
BUN: 16 mg/dL (ref 6–20)
CALCIUM: 9 mg/dL (ref 8.9–10.3)
CO2: 29 mmol/L (ref 22–32)
Chloride: 96 mmol/L — ABNORMAL LOW (ref 101–111)
Creatinine, Ser: 1.02 mg/dL (ref 0.61–1.24)
GLUCOSE: 172 mg/dL — AB (ref 65–99)
POTASSIUM: 4.7 mmol/L (ref 3.5–5.1)
Sodium: 133 mmol/L — ABNORMAL LOW (ref 135–145)
TOTAL PROTEIN: 8.2 g/dL — AB (ref 6.5–8.1)

## 2017-05-25 LAB — INFLUENZA PANEL BY PCR (TYPE A & B)
INFLAPCR: NEGATIVE
INFLBPCR: NEGATIVE

## 2017-05-25 LAB — CBC WITH DIFFERENTIAL/PLATELET
Basophils Absolute: 0.1 10*3/uL (ref 0–0.1)
Basophils Relative: 1 %
Eosinophils Absolute: 0.1 10*3/uL (ref 0–0.7)
Eosinophils Relative: 1 %
HEMATOCRIT: 37 % — AB (ref 40.0–52.0)
HEMOGLOBIN: 11.6 g/dL — AB (ref 13.0–18.0)
LYMPHS ABS: 1.4 10*3/uL (ref 1.0–3.6)
Lymphocytes Relative: 12 %
MCH: 26.5 pg (ref 26.0–34.0)
MCHC: 31.3 g/dL — AB (ref 32.0–36.0)
MCV: 85 fL (ref 80.0–100.0)
MONOS PCT: 9 %
Monocytes Absolute: 1.1 10*3/uL — ABNORMAL HIGH (ref 0.2–1.0)
NEUTROS ABS: 9.1 10*3/uL — AB (ref 1.4–6.5)
NEUTROS PCT: 77 %
Platelets: 374 10*3/uL (ref 150–440)
RBC: 4.36 MIL/uL — ABNORMAL LOW (ref 4.40–5.90)
RDW: 19.4 % — AB (ref 11.5–14.5)
WBC: 11.8 10*3/uL — ABNORMAL HIGH (ref 3.8–10.6)

## 2017-05-25 LAB — TROPONIN I

## 2017-05-25 MED ORDER — IPRATROPIUM-ALBUTEROL 0.5-2.5 (3) MG/3ML IN SOLN
3.0000 mL | Freq: Once | RESPIRATORY_TRACT | Status: AC
  Administered 2017-05-25: 3 mL via RESPIRATORY_TRACT
  Filled 2017-05-25: qty 3

## 2017-05-25 MED ORDER — DOXYCYCLINE HYCLATE 100 MG PO TABS: 100.0000 mg | ORAL_TABLET | Freq: Two times a day (BID) | ORAL | 0 refills | Status: DC

## 2017-05-25 MED ORDER — DOXYCYCLINE HYCLATE 100 MG PO TABS
100.0000 mg | ORAL_TABLET | Freq: Once | ORAL | Status: AC
  Administered 2017-05-25: 100 mg via ORAL
  Filled 2017-05-25: qty 1

## 2017-05-25 MED ORDER — ALBUTEROL SULFATE HFA 108 (90 BASE) MCG/ACT IN AERS: 2.0000 | INHALATION_SPRAY | Freq: Four times a day (QID) | RESPIRATORY_TRACT | 2 refills | Status: DC | PRN

## 2017-05-25 MED ORDER — PREDNISONE 10 MG PO TABS: 10.0000 mg | ORAL_TABLET | Freq: Every day | ORAL | 0 refills | Status: DC

## 2017-05-25 MED ORDER — METHYLPREDNISOLONE SODIUM SUCC 125 MG IJ SOLR
125.0000 mg | Freq: Once | INTRAMUSCULAR | Status: AC
  Administered 2017-05-25: 125 mg via INTRAVENOUS
  Filled 2017-05-25: qty 2

## 2017-05-25 NOTE — ED Notes (Signed)
Pt reports this is baseline respiratory status

## 2017-05-25 NOTE — ED Triage Notes (Addendum)
Pt arrives via [facility] from home with reports of "seizure-like activity," per pt friend who called. Per friend, pt never lost consciousness and was able to talk to her during said seizure. Pt reports being weaker than normal today. Tachypnic currently.  Pt has hx COPD, current productive cough.

## 2017-05-25 NOTE — ED Notes (Signed)
Pt ambulatory in hallway with this RN without difficulty, maintain 94-97% on room air, denies increased [facility]

## 2017-05-25 NOTE — ED Notes (Signed)
Pt given 2 colas and coffee per request

## 2017-05-25 NOTE — ED Notes (Signed)
Apologized to family and pt for wait, declines wants/needs at this time.   Pt remains with audible wheezing. Pt reports this is normal.

## 2017-05-25 NOTE — ED Notes (Signed)
Update provided to pt and family that waiting for admission/admitting MD

## 2017-05-25 NOTE — ED Notes (Signed)
Friend contact information  [place] Cooks  [phone]

## 2017-05-25 NOTE — ED Provider Notes (Signed)
[facility] Emergency Department Provider Note    First MD Initiated Contact with Patient 05/25/17 1856     (approximate)  I have reviewed the triage vital signs and the nursing notes.   HISTORY  Chief Complaint Weakness    HPI John Frank is a 62 y.o. male with a history of COPD as well as CAD with defibrillator presents with shaking seizure-like episode that occurred today while he is at the desk after he started feeling weak.  Patient states that he has been feeling more short of breath with a productive cough for the past 2-3 days.  Denies any headache.  No numbness or tingling.  No recent fevers but has been coughing thick greenish phlegm.  Has not been on any recent steroids.  No recent admissions to hospital.  Past Medical History:  Diagnosis Date  . COPD (chronic obstructive pulmonary disease) ([place])   . Diabetes ([place])   . GERD (gastroesophageal reflux disease)   . Heart attack ([place])   . Hyperlipidemia   . Hypertension   . Presence of permanent cardiac pacemaker   . Shortness of breath dyspnea   . Sleep apnea    Family History  Problem Relation Age of Onset  . Diabetes Mother   . Heart attack Father 38   Past Surgical History:  Procedure Laterality Date  . CARDIAC SURGERY     defibrillator and pacemaker  . ESOPHAGOGASTRODUODENOSCOPY N/A 09/19/2014   Procedure: ESOPHAGOGASTRODUODENOSCOPY (EGD);  Surgeon: Lucilla Lame, MD;  Location: [facility] ENDOSCOPY;  Service: Endoscopy;  Laterality: N/A;   Patient Active Problem List   Diagnosis Date Noted  . Faintness 01/11/2016  . Obesity 01/01/2016  . Tobacco abuse 04/06/2015  . COPD exacerbation ([place]) 12/24/2014  . Hypertension, accelerated with heart disease, without CHF 11/03/2014  . Sleep apnea 10/26/2014  . CAD (coronary artery disease) 10/26/2014  . GERD (gastroesophageal reflux disease) 10/26/2014  . Polyp of colon 10/26/2014  . Diabetes mellitus without complication ([place]) 83/41/9622  .  Hyperlipidemia 10/26/2014  . COPD, severe ([place]) 10/26/2014  . Status cardiac pacemaker 10/26/2014  . Lesion of nasal septum 10/26/2014      Prior to Admission medications   Medication Sig Start Date End Date Taking? Authorizing Provider  albuterol (PROVENTIL HFA;VENTOLIN HFA) 108 (90 Base) MCG/ACT inhaler Inhale 2 puffs into the lungs every 6 (six) hours as needed for wheezing or shortness of breath. 05/25/17   Merlyn Lot, MD  aspirin EC 81 MG tablet Take 81 mg by mouth daily.    [provider]  atorvastatin (LIPITOR) 40 MG tablet TAKE 1 TABLET (40 MG TOTAL) BY MOUTH DAILY. 04/10/17   Kathrine Haddock, NP  benzonatate (TESSALON PERLES) 100 MG capsule Take 1 capsule (100 mg total) by mouth 3 (three) times daily as needed for cough (Take 1-2 per dose). Patient not taking: Reported on 05/08/2017 10/18/16   Menshew, Dannielle Karvonen, PA-C  budesonide-formoterol [facility]) 160-4.5 MCG/ACT inhaler Inhale 2 puffs into the lungs 2 (two) times daily. 06/15/15   Kathrine Haddock, NP  buPROPion (WELLBUTRIN) 75 MG tablet Take 75 mg by mouth daily. 03/26/16 05/08/18  [provider]  chlorpheniramine-HYDROcodone (TUSSIONEX PENNKINETIC ER) 10-8 MG/5ML SUER Take 5 mLs by mouth every 12 (twelve) hours as needed. Patient not taking: Reported on 05/08/2017 12/18/16   Gregor Hams, MD  clopidogrel (PLAVIX) 75 MG tablet TAKE 1 TABLET (75 MG TOTAL) BY MOUTH DAILY. 12/08/16   Kathrine Haddock, NP  doxycycline (VIBRA-TABS) 100 MG tablet Take  1 tablet (100 mg total) by mouth 2 (two) times daily for 7 days. 05/25/17 06/01/17  Merlyn Lot, MD  enalapril (VASOTEC) 5 MG tablet TAKE 1 TABLET (5 MG TOTAL) BY MOUTH DAILY. NEED F/U APPT FOR ADDITIONAL REFILLS 08/07/16   Kathrine Haddock, NP  famotidine (PEPCID) 40 MG tablet Take 1 tablet (40 mg total) by mouth daily. 05/08/17   Darel Hong, MD  hydrocortisone 1 % ointment Apply 1 application topically 2 (two) times daily. Patient not taking: Reported on  05/08/2017 10/18/16   Menshew, Dannielle Karvonen, PA-C  ipratropium-albuterol (DUONEB) 0.5-2.5 (3) MG/3ML SOLN Take 3 mLs by nebulization every 6 (six) hours as needed (wheezing; shortness of breath).    [provider]  JANUVIA 100 MG tablet TAKE 1 TABLET (100 MG TOTAL) BY MOUTH DAILY. 04/10/17   Kathrine Haddock, NP  levETIRAcetam (KEPPRA) 500 MG tablet Take 500 mg by mouth 2 (two) times daily.    [provider]  metFORMIN (GLUCOPHAGE) 1000 MG tablet TAKE 1 TABLET (1,000 MG TOTAL) BY MOUTH 2 (TWO) TIMES DAILY WITH A MEAL. 09/12/16   Kathrine Haddock, NP  metoprolol succinate (TOPROL-XL) 50 MG 24 hr tablet TAKE 1 TABLET (50 MG TOTAL) BY MOUTH DAILY. TAKE WITH OR IMMEDIATELY FOLLOWING A MEAL. 01/19/17   Guadalupe Maple, MD  metoprolol succinate (TOPROL-XL) 50 MG 24 hr tablet TAKE 1 TABLET (50 MG TOTAL) BY MOUTH DAILY. TAKE WITH OR IMMEDIATELY FOLLOWING A MEAL. Patient not taking: Reported on 05/08/2017 02/09/17   Kathrine Haddock, NP  nitroGLYCERIN (NITROSTAT) 0.4 MG SL tablet Place 0.4 mg under the tongue every 5 (five) minutes as needed for chest pain.    [provider]  ONE TOUCH ULTRA TEST test strip USE TO TEST BLOOD SUGAR ONCE DAILY 10/07/16   Wynetta Emery, Megan P, DO  pantoprazole (PROTONIX) 40 MG tablet TAKE 1 TABLET (40 MG TOTAL) BY MOUTH EVERY MORNING. 12/08/16   Kathrine Haddock, NP  predniSONE (DELTASONE) 10 MG tablet Take 1 tablet (10 mg total) by mouth daily. Day 1-2: Take 50 mg  ( 5 pills) Day 3-4 : Take 40 mg (4pills) Day 5-6: Take 30 mg (3 pills) Day 7-8:  Take 20 mg (2 pills) Day 9:  Take 10mg  (1 pill) 05/25/17   Merlyn Lot, MD    Allergies Patient has no known allergies.    Social History Social History   Tobacco Use  . Smoking status: Current Every Day Smoker    Packs/day: 0.50    Years: 40.00    Pack years: 20.00    Types: Cigarettes  . Smokeless tobacco: Former Network engineer Use Topics  . Alcohol use: No  . Drug use: No    Review of  Systems Patient denies headaches, rhinorrhea, blurry vision, numbness, shortness of breath, chest pain, edema, cough, abdominal pain, nausea, vomiting, diarrhea, dysuria, fevers, rashes or hallucinations unless otherwise stated above in HPI. ____________________________________________   PHYSICAL EXAM:  VITAL SIGNS: Vitals:   05/25/17 1854  BP: 137/66  Resp: (!) 25  Temp: (!) 97.4 F (36.3 C)  SpO2: 99%    Constitutional: Alert and oriented.  Patient with pursed lip breathing.  No tripoding.  Does appear in mild shortness of breath. Eyes: Conjunctivae are normal.  Head: Atraumatic. Nose: No congestion/rhinnorhea. Mouth/Throat: Mucous membranes are moist.   Neck: No stridor. Painless ROM.  Cardiovascular: Normal rate, regular rhythm. Grossly normal heart sounds.  Good peripheral circulation. Respiratory: Tachypnea with prolonged expiratory phase and pursed lip breathing.  Breath sounds  are equal bilaterally with diffuse wheeze .  Gastrointestinal: Soft and nontender. No distention. No abdominal bruits. No CVA tenderness. Genitourinary:  Musculoskeletal: No lower extremity tenderness nor edema.  No joint effusions. Neurologic:  Normal speech and language. No gross focal neurologic deficits are appreciated. No facial droop Skin:  Skin is warm, dry and intact. No rash noted. Psychiatric: Mood and affect are normal. Speech and behavior are normal.  ____________________________________________   LABS (all labs ordered are listed, but only abnormal results are displayed)  Results for orders placed or performed during the hospital encounter of 05/25/17 (from the past 24 hour(s))  CBC with Differential/Platelet     Status: Abnormal   Collection Time: 05/25/17  6:58 PM  Result Value Ref Range   WBC 11.8 (H) 3.8 - 10.6 K/uL   RBC 4.36 (L) 4.40 - 5.90 MIL/uL   Hemoglobin 11.6 (L) 13.0 - 18.0 g/dL   HCT 37.0 (L) 40.0 - 52.0 %   MCV 85.0 80.0 - 100.0 fL   MCH 26.5 26.0 - 34.0 pg    MCHC 31.3 (L) 32.0 - 36.0 g/dL   RDW 19.4 (H) 11.5 - 14.5 %   Platelets 374 150 - 440 K/uL   Neutrophils Relative % 77 %   Neutro Abs 9.1 (H) 1.4 - 6.5 K/uL   Lymphocytes Relative 12 %   Lymphs Abs 1.4 1.0 - 3.6 K/uL   Monocytes Relative 9 %   Monocytes Absolute 1.1 (H) 0.2 - 1.0 K/uL   Eosinophils Relative 1 %   Eosinophils Absolute 0.1 0 - 0.7 K/uL   Basophils Relative 1 %   Basophils Absolute 0.1 0 - 0.1 K/uL  Comprehensive metabolic panel     Status: Abnormal   Collection Time: 05/25/17  6:58 PM  Result Value Ref Range   Sodium 133 (L) 135 - 145 mmol/L   Potassium 4.7 3.5 - 5.1 mmol/L   Chloride 96 (L) 101 - 111 mmol/L   CO2 29 22 - 32 mmol/L   Glucose, Bld 172 (H) 65 - 99 mg/dL   BUN 16 6 - 20 mg/dL   Creatinine, Ser 1.02 0.61 - 1.24 mg/dL   Calcium 9.0 8.9 - 10.3 mg/dL   Total Protein 8.2 (H) 6.5 - 8.1 g/dL   Albumin 4.3 3.5 - 5.0 g/dL   AST 27 15 - 41 U/L   ALT 23 17 - 63 U/L   Alkaline Phosphatase 108 38 - 126 U/L   Total Bilirubin 1.3 (H) 0.3 - 1.2 mg/dL   GFR calc non Af Amer >60 >60 mL/min   GFR calc Af Amer >60 >60 mL/min   Anion gap 8 5 - 15  Troponin I     Status: None   Collection Time: 05/25/17  6:58 PM  Result Value Ref Range   Troponin I <0.03 <0.03 ng/mL   ____________________________________________  EKG My review and personal interpretation at Time: 18:54   Indication: weakness  Rate: 90  Rhythm: sinus Axis: normal Other: poor r wave progression, nonspecific st and t wave changes, consistent with previous ____________________________________________  RADIOLOGY  I personally reviewed all radiographic images ordered to evaluate for the above acute complaints and reviewed radiology reports and findings.  These findings were personally discussed with the patient.  Please see medical record for radiology report.  ____________________________________________   PROCEDURES  Procedure(s) performed:  Procedures    Critical Care performed:  no ____________________________________________   INITIAL IMPRESSION / ASSESSMENT AND PLAN / ED COURSE  Pertinent labs &  imaging results that were available during my care of the patient were reviewed by me and considered in my medical decision making (see chart for details).  DDX: Asthma, copd, CHF, pna, ptx, malignancy, Pe, anemia   BENUEL LY is a 62 y.o. who presents to the ED with symptoms as described above.  Presentation concerning for malignancy, SAH stroke.  CT imaging of head shows no mass-effect bleed or CVA.  Neuro exam is nonfocal at this time.  Seems less consistent with true seizure activity as the patient states that he was able to remember what his wife was saying and did not fully lose consciousness.  Chest x-ray shows no evidence of pneumonia or consolidation.  Blood will be sent for the above differential.  Patient is short of breath with increased work of breathing therefore will give nebulizer treatments steroids for COPD exacerbation and reassess.  Clinical Course as of May 25 2034  Molli Knock May 25, 2017  2033 Patient reassessed.  Still short of breath but has not received breathing treatments yet.  He was able to ambulate with no hypoxia.  This point patient will be signed out to oncoming physician pending reassessment for improvement after nebulizer treatments.  Have discussed with the patient and available family all diagnostics and treatments performed thus far and all questions were answered to the best of my ability. The patient demonstrates understanding and agreement with plan.   [PR]    Clinical Course User Index [PR] Merlyn Lot, MD     ____________________________________________   FINAL CLINICAL IMPRESSION(S) / ED DIAGNOSES  Final diagnoses:  Weakness  Chronic obstructive pulmonary disease with acute exacerbation (HCC)      NEW MEDICATIONS STARTED DURING THIS VISIT:  New Prescriptions   ALBUTEROL (PROVENTIL HFA;VENTOLIN HFA) 108 (90 BASE)  MCG/ACT INHALER    Inhale 2 puffs into the lungs every 6 (six) hours as needed for wheezing or shortness of breath.   DOXYCYCLINE (VIBRA-TABS) 100 MG TABLET    Take 1 tablet (100 mg total) by mouth 2 (two) times daily for 7 days.   PREDNISONE (DELTASONE) 10 MG TABLET    Take 1 tablet (10 mg total) by mouth daily. Day 1-2: Take 50 mg  ( 5 pills) Day 3-4 : Take 40 mg (4pills) Day 5-6: Take 30 mg (3 pills) Day 7-8:  Take 20 mg (2 pills) Day 9:  Take 10mg  (1 pill)     Note:  This document was prepared using Dragon voice recognition software and may include unintentional dictation errors.    Merlyn Lot, MD 05/25/17 2036

## 2017-05-25 NOTE — ED Notes (Signed)
Pt cleared my EDMD to eat. Pt provided sandwich tray by RN

## 2017-05-25 NOTE — ED Notes (Signed)
Portable XR at bedside

## 2017-05-26 ENCOUNTER — Ambulatory Visit: Payer: Medicare Other | Admitting: Physical Therapy

## 2017-05-26 DIAGNOSIS — R55 Syncope and collapse: Secondary | ICD-10-CM | POA: Diagnosis present

## 2017-05-26 DIAGNOSIS — E119 Type 2 diabetes mellitus without complications: Secondary | ICD-10-CM | POA: Diagnosis not present

## 2017-05-26 DIAGNOSIS — G4733 Obstructive sleep apnea (adult) (pediatric): Secondary | ICD-10-CM | POA: Diagnosis not present

## 2017-05-26 DIAGNOSIS — I5042 Chronic combined systolic (congestive) and diastolic (congestive) heart failure: Secondary | ICD-10-CM | POA: Diagnosis present

## 2017-05-26 DIAGNOSIS — I110 Hypertensive heart disease with heart failure: Secondary | ICD-10-CM | POA: Diagnosis not present

## 2017-05-26 DIAGNOSIS — W07XXXA Fall from chair, initial encounter: Secondary | ICD-10-CM | POA: Diagnosis not present

## 2017-05-26 DIAGNOSIS — I252 Old myocardial infarction: Secondary | ICD-10-CM | POA: Diagnosis not present

## 2017-05-26 DIAGNOSIS — J441 Chronic obstructive pulmonary disease with (acute) exacerbation: Secondary | ICD-10-CM | POA: Diagnosis not present

## 2017-05-26 DIAGNOSIS — Z7902 Long term (current) use of antithrombotics/antiplatelets: Secondary | ICD-10-CM | POA: Diagnosis not present

## 2017-05-26 DIAGNOSIS — Z79899 Other long term (current) drug therapy: Secondary | ICD-10-CM | POA: Diagnosis not present

## 2017-05-26 DIAGNOSIS — E785 Hyperlipidemia, unspecified: Secondary | ICD-10-CM | POA: Diagnosis not present

## 2017-05-26 DIAGNOSIS — F17210 Nicotine dependence, cigarettes, uncomplicated: Secondary | ICD-10-CM | POA: Diagnosis not present

## 2017-05-26 DIAGNOSIS — R569 Unspecified convulsions: Secondary | ICD-10-CM | POA: Diagnosis not present

## 2017-05-26 DIAGNOSIS — Z7982 Long term (current) use of aspirin: Secondary | ICD-10-CM | POA: Diagnosis not present

## 2017-05-26 DIAGNOSIS — I2510 Atherosclerotic heart disease of native coronary artery without angina pectoris: Secondary | ICD-10-CM | POA: Diagnosis not present

## 2017-05-26 DIAGNOSIS — R008 Other abnormalities of heart beat: Secondary | ICD-10-CM | POA: Diagnosis not present

## 2017-05-26 DIAGNOSIS — K219 Gastro-esophageal reflux disease without esophagitis: Secondary | ICD-10-CM | POA: Diagnosis not present

## 2017-05-26 DIAGNOSIS — Z7984 Long term (current) use of oral hypoglycemic drugs: Secondary | ICD-10-CM | POA: Diagnosis not present

## 2017-05-26 DIAGNOSIS — I429 Cardiomyopathy, unspecified: Secondary | ICD-10-CM | POA: Diagnosis not present

## 2017-05-26 LAB — CBC
HCT: 33.3 % — ABNORMAL LOW (ref 40.0–52.0)
Hemoglobin: 10.9 g/dL — ABNORMAL LOW (ref 13.0–18.0)
MCH: 27.5 pg (ref 26.0–34.0)
MCHC: 32.7 g/dL (ref 32.0–36.0)
MCV: 84.2 fL (ref 80.0–100.0)
PLATELETS: 290 10*3/uL (ref 150–440)
RBC: 3.96 MIL/uL — AB (ref 4.40–5.90)
RDW: 19.1 % — AB (ref 11.5–14.5)
WBC: 7 10*3/uL (ref 3.8–10.6)

## 2017-05-26 LAB — URINALYSIS, COMPLETE (UACMP) WITH MICROSCOPIC
BACTERIA UA: NONE SEEN
BILIRUBIN URINE: NEGATIVE
GLUCOSE, UA: 50 mg/dL — AB
Hgb urine dipstick: NEGATIVE
KETONES UR: NEGATIVE mg/dL
LEUKOCYTES UA: NEGATIVE
NITRITE: NEGATIVE
PH: 6 (ref 5.0–8.0)
Protein, ur: NEGATIVE mg/dL
SPECIFIC GRAVITY, URINE: 1.005 (ref 1.005–1.030)
Squamous Epithelial / LPF: NONE SEEN

## 2017-05-26 LAB — BASIC METABOLIC PANEL
Anion gap: 10 (ref 5–15)
BUN: 14 mg/dL (ref 6–20)
CALCIUM: 9.1 mg/dL (ref 8.9–10.3)
CHLORIDE: 98 mmol/L — AB (ref 101–111)
CO2: 26 mmol/L (ref 22–32)
CREATININE: 0.89 mg/dL (ref 0.61–1.24)
GFR calc non Af Amer: >60 mL/min (ref >60)
GLUCOSE: 304 mg/dL — AB (ref 65–99)
Potassium: 4.6 mmol/L (ref 3.5–5.1)
Sodium: 134 mmol/L — ABNORMAL LOW (ref 135–145)

## 2017-05-26 LAB — GLUCOSE, CAPILLARY
GLUCOSE-CAPILLARY: 301 mg/dL — AB (ref 65–99)
Glucose-Capillary: 300 mg/dL — ABNORMAL HIGH (ref 65–99)

## 2017-05-26 MED ORDER — DOXYCYCLINE HYCLATE 100 MG PO TABS: 100.0000 mg | ORAL_TABLET | Freq: Two times a day (BID) | ORAL | 0 refills | Status: AC

## 2017-05-26 MED ORDER — ASPIRIN EC 81 MG PO TBEC
81.0000 mg | DELAYED_RELEASE_TABLET | Freq: Every day | ORAL | Status: DC
  Administered 2017-05-26: 81 mg via ORAL
  Filled 2017-05-26: qty 1

## 2017-05-26 MED ORDER — BUPROPION HCL 75 MG PO TABS
75.0000 mg | ORAL_TABLET | Freq: Every day | ORAL | Status: DC
  Administered 2017-05-26: 75 mg via ORAL
  Filled 2017-05-26: qty 1

## 2017-05-26 MED ORDER — ATORVASTATIN CALCIUM 20 MG PO TABS: 40.0000 mg | ORAL_TABLET | Freq: Every day | ORAL | Status: DC

## 2017-05-26 MED ORDER — AZITHROMYCIN 250 MG PO TABS
500.0000 mg | ORAL_TABLET | Freq: Every day | ORAL | Status: DC
  Administered 2017-05-26: 500 mg via ORAL
  Filled 2017-05-26: qty 2

## 2017-05-26 MED ORDER — METHYLPREDNISOLONE SODIUM SUCC 125 MG IJ SOLR
60.0000 mg | Freq: Four times a day (QID) | INTRAMUSCULAR | Status: DC
  Administered 2017-05-26 (×2): 60 mg via INTRAVENOUS
  Filled 2017-05-26 (×2): qty 2

## 2017-05-26 MED ORDER — PANTOPRAZOLE SODIUM 40 MG PO TBEC
40.0000 mg | DELAYED_RELEASE_TABLET | ORAL | Status: DC
  Administered 2017-05-26: 40 mg via ORAL
  Filled 2017-05-26: qty 1

## 2017-05-26 MED ORDER — MOMETASONE FURO-FORMOTEROL FUM 200-5 MCG/ACT IN AERO
2.0000 | INHALATION_SPRAY | Freq: Two times a day (BID) | RESPIRATORY_TRACT | Status: DC
  Administered 2017-05-26: 2 via RESPIRATORY_TRACT
  Filled 2017-05-26: qty 8.8

## 2017-05-26 MED ORDER — METOPROLOL SUCCINATE ER 50 MG PO TB24
50.0000 mg | ORAL_TABLET | Freq: Every day | ORAL | Status: DC
  Administered 2017-05-26: 50 mg via ORAL
  Filled 2017-05-26: qty 1

## 2017-05-26 MED ORDER — ENALAPRIL MALEATE 5 MG PO TABS
5.0000 mg | ORAL_TABLET | Freq: Every day | ORAL | Status: DC
  Administered 2017-05-26: 5 mg via ORAL
  Filled 2017-05-26: qty 1

## 2017-05-26 MED ORDER — LEVETIRACETAM 500 MG PO TABS
500.0000 mg | ORAL_TABLET | Freq: Two times a day (BID) | ORAL | Status: DC
  Administered 2017-05-26 (×2): 500 mg via ORAL
  Filled 2017-05-26 (×2): qty 1

## 2017-05-26 MED ORDER — ONDANSETRON HCL 4 MG PO TABS: 4.0000 mg | ORAL_TABLET | Freq: Four times a day (QID) | ORAL | Status: DC | PRN

## 2017-05-26 MED ORDER — PREDNISONE 10 MG PO TABS: 10.0000 mg | ORAL_TABLET | Freq: Every day | ORAL | 0 refills | Status: DC

## 2017-05-26 MED ORDER — SODIUM CHLORIDE 0.9% FLUSH
3.0000 mL | INTRAVENOUS | Status: DC | PRN
  Administered 2017-05-26 (×2): 3 mL via INTRAVENOUS
  Filled 2017-05-26: qty 3

## 2017-05-26 MED ORDER — CLOPIDOGREL BISULFATE 75 MG PO TABS
75.0000 mg | ORAL_TABLET | Freq: Every day | ORAL | Status: DC
Start: 2017-05-26 — End: 2017-05-26
  Administered 2017-05-26: 75 mg via ORAL
  Filled 2017-05-26: qty 1

## 2017-05-26 MED ORDER — SODIUM CHLORIDE 0.9% FLUSH: 3.0000 mL | Freq: Two times a day (BID) | INTRAVENOUS | Status: DC

## 2017-05-26 MED ORDER — INSULIN ASPART 100 UNIT/ML ~~LOC~~ SOLN
0.0000 [IU] | Freq: Three times a day (TID) | SUBCUTANEOUS | Status: DC
  Administered 2017-05-26: 7 [IU] via SUBCUTANEOUS
  Filled 2017-05-26: qty 1

## 2017-05-26 MED ORDER — ENOXAPARIN SODIUM 40 MG/0.4ML ~~LOC~~ SOLN
40.0000 mg | SUBCUTANEOUS | Status: DC
  Filled 2017-05-26: qty 0.4

## 2017-05-26 MED ORDER — ACETAMINOPHEN 650 MG RE SUPP: 650.0000 mg | Freq: Four times a day (QID) | RECTAL | Status: DC | PRN

## 2017-05-26 MED ORDER — ONDANSETRON HCL 4 MG/2ML IJ SOLN: 4.0000 mg | Freq: Four times a day (QID) | INTRAMUSCULAR | Status: DC | PRN

## 2017-05-26 MED ORDER — IPRATROPIUM-ALBUTEROL 0.5-2.5 (3) MG/3ML IN SOLN
3.0000 mL | RESPIRATORY_TRACT | Status: DC | PRN
Start: 2017-05-26 — End: 2017-05-26

## 2017-05-26 MED ORDER — ALBUTEROL SULFATE HFA 108 (90 BASE) MCG/ACT IN AERS: 2.0000 | INHALATION_SPRAY | Freq: Four times a day (QID) | RESPIRATORY_TRACT | 2 refills | Status: DC | PRN

## 2017-05-26 MED ORDER — ACETAMINOPHEN 325 MG PO TABS
650.0000 mg | ORAL_TABLET | Freq: Four times a day (QID) | ORAL | Status: DC | PRN
Start: 2017-05-26 — End: 2017-05-26

## 2017-05-26 NOTE — Progress Notes (Signed)
Inpatient Diabetes Program Recommendations  AACE/ADA: New Consensus Statement on Inpatient Glycemic Control (2015)  Target Ranges:  Prepandial:   less than 140 mg/dL      Peak postprandial:   less than 180 mg/dL (1-2 hours)      Critically ill patients:  140 - 180 mg/dL   Results for John Frank, John Frank (MRN [id number]) as of 05/26/2017 09:01  Ref. Range 05/26/2017 02:08 05/26/2017 07:11  Glucose-Capillary Latest Ref Range: 65 - 99 mg/dL 300 (H) 301 (H)   Review of Glycemic Control  Diabetes history: DM2 Outpatient Diabetes medications: Januvia 100 mg daily, Metformin 1000 mg BID Current orders for Inpatient glycemic control: Novolog 0-9 units ACHS; Solumedrol 60 mg Q6H  Inpatient Diabetes Program Recommendations: Insulin - Basal: If steroids are continued as ordered, please consider ordering Lantus 7 units Q24H (based on 70 kg x 0.1 units). Insulin - Meal Coverage: If steroids are continued, please consider ordering Novolog 4 units TID with meals for meal coverage if patient eats at least 50% of meals. HgbA1C: Please consider ordering an A1C to evaluate glycemic control over the past 2-3 months.  Thanks, Barnie Alderman, RN, MSN, CDE Diabetes Coordinator Inpatient Diabetes Program [phone] (Team Pager from 8am to 5pm)

## 2017-05-26 NOTE — Consult Note (Signed)
John Frank  Frank CONSULT NOTE  Patient ID: John Frank MRN: [id number] DOB/AGE: Dec 05, 1955 62 y.o.  Admit date: 05/25/2017 Referring Physician Verdell Carmine Primary Physician [place] Primary Cardiologist [facility] Reason for Consultation Near syncope  HPI: 62 year old male referred for evaluation of near syncope with a history of recurrent syncope. The patient has a history of CAD, status post ICD implantation, OSA, noncompliant with CPAP, cardiomyopathy with LVEF 60%, chronic systolic CHF, COPD without ongoing tobacco abuse, type II diabetes, and dyslipidemia. The patient presented to the ED on 05/25/17 for a near-syncopal episode. Patient reports that he was sitting in his chair when he became really warm and fell to the ground. He never lost consciousness but was "out of it" for 1-2 seconds. The patient has a 1 year history of these episodes which reportedly have increased to 3 times per week in frequency recently. He is followed by Dr. Clayborn Bigness who recently put him on a Holter monitor, which showed PVCs, but was otherwise unremarkable. During the time the patient was wearing the Holter monitor, he did not have any syncopal episodes. The patient has chronic shortness of breath at rest with a history of COPD, but is not on oxygen at home. The patient denies chest pain, palpitations, or peripheral edema. The patient admits to productive cough, but no fever, chills, or night sweats. Of note, the patient underwent a nuclear stress test on 04/19/17, which revealed severely decrease EF of 25% with inferior and posterior scar without ischemia. 2D echocardiogram 04/13/17 revealed LV EF 30% with mild MR and TR. Most recent ICD interrogation on 04/15/17 revealed normal ICD function with a longevity of 22-28 months, with an underlying rhythm of sinus rhythm at 80 bpm, with no shocks delivered.  Review of systems complete and found to be negative unless listed above     Past Medical History:  Diagnosis  Date  . COPD (chronic obstructive pulmonary disease) ([place])   . Diabetes ([place])   . GERD (gastroesophageal reflux disease)   . Heart attack ([place])   . Hyperlipidemia   . Hypertension   . Presence of permanent cardiac pacemaker   . Shortness of breath dyspnea   . Sleep apnea     Past Surgical History:  Procedure Laterality Date  . CARDIAC SURGERY     defibrillator and pacemaker  . ESOPHAGOGASTRODUODENOSCOPY N/A 09/19/2014   Procedure: ESOPHAGOGASTRODUODENOSCOPY (EGD);  Surgeon: Lucilla Lame, MD;  Location[facility] ENDOSCOPY;  Service: Endoscopy;  Laterality: N/A;    Medications Prior to Admission  Medication Sig Dispense Refill Last Dose  . aspirin EC 81 MG tablet Take 81 mg by mouth daily.   05/25/2017  . atorvastatin (LIPITOR) 40 MG tablet TAKE 1 TABLET (40 MG TOTAL) BY MOUTH DAILY. 90 tablet 1 05/25/2017  . budesonide-formoterol (SYMBICORT) 160-4.5 MCG/ACT inhaler Inhale 2 puffs into the lungs 2 (two) times daily. 1 Inhaler 3 05/25/2017  . buPROPion (WELLBUTRIN) 75 MG tablet Take 75 mg by mouth daily.   05/25/2017  . clopidogrel (PLAVIX) 75 MG tablet TAKE 1 TABLET (75 MG TOTAL) BY MOUTH DAILY. 90 tablet 3 05/25/2017  . enalapril (VASOTEC) 5 MG tablet TAKE 1 TABLET (5 MG TOTAL) BY MOUTH DAILY. NEED F/U APPT FOR ADDITIONAL REFILLS 30 tablet 3 05/25/2017  . famotidine (PEPCID) 40 MG tablet Take 1 tablet (40 mg total) by mouth daily. 30 tablet 0 05/25/2017  . ipratropium-albuterol (DUONEB) 0.5-2.5 (3) MG/3ML SOLN Take 3 mLs by nebulization every 6 (six) hours as needed (wheezing; shortness of breath).  prn at prn  . JANUVIA 100 MG tablet TAKE 1 TABLET (100 MG TOTAL) BY MOUTH DAILY. 90 tablet 1 05/25/2017  . levETIRAcetam (KEPPRA) 500 MG tablet Take 500 mg by mouth 2 (two) times daily.   05/25/2017  . metFORMIN (GLUCOPHAGE) 1000 MG tablet TAKE 1 TABLET (1,000 MG TOTAL) BY MOUTH 2 (TWO) TIMES DAILY WITH A MEAL. 180 tablet 1 05/25/2017  . metoprolol succinate (TOPROL-XL) 50 MG 24 hr tablet TAKE 1 TABLET (50  MG TOTAL) BY MOUTH DAILY. TAKE WITH OR IMMEDIATELY FOLLOWING A MEAL. 90 tablet 1 05/25/2017  . nitroGLYCERIN (NITROSTAT) 0.4 MG SL tablet Place 0.4 mg under the tongue every 5 (five) minutes as needed for chest pain.   prn at prn  . ONE TOUCH ULTRA TEST test strip USE TO TEST BLOOD SUGAR ONCE DAILY 100 each 3 05/25/2017  . pantoprazole (PROTONIX) 40 MG tablet TAKE 1 TABLET (40 MG TOTAL) BY MOUTH EVERY MORNING. 90 tablet 1 05/25/2017   Social History   Socioeconomic History  . Marital status: Single    Spouse name: Not on file  . Number of children: Not on file  . Years of education: Not on file  . Highest education level: Not on file  Social Needs  . Financial resource strain: Not on file  . Food insecurity - worry: Not on file  . Food insecurity - inability: Not on file  . Transportation needs - medical: Not on file  . Transportation needs - non-medical: Not on file  Occupational History  . Not on file  Tobacco Use  . Smoking status: Current Every Day Smoker    Packs/day: 0.50    Years: 40.00    Pack years: 20.00    Types: Cigarettes  . Smokeless tobacco: Former Network engineer and Sexual Activity  . Alcohol use: No  . Drug use: No  . Sexual activity: Not on file  Other Topics Concern  . Not on file  Social History Narrative  . Not on file    Family History  Problem Relation Age of Onset  . Diabetes Mother   . Heart attack Father 44      Review of systems complete and found to be negative unless listed above      PHYSICAL EXAM  General: Well developed, well nourished, in no acute distress HEENT:  Normocephalic and atramatic Neck:  No JVD.  Lungs: Expiratory wheezes bilaterally. Normal effort of breathing on room air. Heart: HRRR . Normal S1 and S2 without gallops or murmurs.  Abdomen: Bowel sounds are positive, abdomen soft and non-tender  Msk:  Sitting upright in bed. Normal strength and tone for age. Extremities: No clubbing, cyanosis or edema.   Neuro:  Alert and oriented X 3. Psych:  Good affect, responds appropriately  Labs:   Lab Results  Component Value Date   WBC 7.0 05/26/2017   HGB 10.9 (L) 05/26/2017   HCT 33.3 (L) 05/26/2017   MCV 84.2 05/26/2017   PLT 290 05/26/2017    Recent Labs  Lab 05/25/17 1858 05/26/17 0421  NA 133* 134*  K 4.7 4.6  CL 96* 98*  CO2 29 26  BUN 16 14  CREATININE 1.02 0.89  CALCIUM 9.0 9.1  PROT 8.2*  --   BILITOT 1.3*  --   ALKPHOS 108  --   ALT 23  --   AST 27  --   GLUCOSE 172* 304*   Lab Results  Component Value Date   CKTOTAL 313 (H) 06/16/2012  CKMB 3.3 06/16/2012   TROPONINI <0.03 05/25/2017    Lab Results  Component Value Date   CHOL 106 01/01/2016   CHOL CANCELED 06/15/2015   CHOL 188 06/15/2015   Lab Results  Component Value Date   HDL 21 (L) 01/01/2016   HDL 16 (L) 06/15/2015   HDL 19 (L) 06/17/2012   Lab Results  Component Value Date   LDLCALC 36 01/01/2016   [place] Comment 06/15/2015   LDLCALC 36 06/17/2012   Lab Results  Component Value Date   TRIG 245 (H) 01/01/2016   TRIG CANCELED 06/15/2015   TRIG 671 ([place]) 06/15/2015   No results found for: CHOLHDL No results found for: LDLDIRECT    Radiology: Ct Head Wo Contrast  Result Date: 05/25/2017 CLINICAL DATA:  Possible seizure activity earlier this evening. Generalized weakness. EXAM: CT HEAD WITHOUT CONTRAST TECHNIQUE: Contiguous axial images were obtained from the base of the skull through the vertex without intravenous contrast. COMPARISON:  02/13/2016, 10/10/2011. FINDINGS: Brain: Ventricular system upper normal in size. No mass lesion. No midline shift. No acute hemorrhage or hematoma. No extra-axial fluid collections. No evidence of acute infarction. No interval change. Vascular: Moderate bilateral carotid siphon and mild bilateral vertebral artery atherosclerosis. No hyperdense vessel. Skull: No skull fracture or other focal osseous abnormality involving the skull. Sinuses/Orbits: Visualized paranasal  sinuses, bilateral mastoid air cells and bilateral middle ear cavities well-aerated. Visualized orbits and globes normal in appearance. Other: Benign hyperdense Thornwaldt cyst in the nasopharynx. IMPRESSION: 1. No acute intracranial abnormality. 2. 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 Electronically Signed   By: Evangeline Dakin M.D.   On: 05/25/2017 19:31   Dg Chest Portable 1 View  Result Date: 05/25/2017 CLINICAL DATA:  Seizure like activity, productive cough EXAM: PORTABLE CHEST 1 VIEW COMPARISON:  02/02/2017 radiograph FINDINGS: Left-sided pacing device as before. No focal pulmonary infiltrate or effusion. Heart size upper limits of normal. No pneumothorax is seen. IMPRESSION: No active disease. Electronically Signed   By: Donavan Foil M.D.   On: 05/25/2017 19:20    EKG: Sinus rhythm, 91 bpm  ASSESSMENT AND PLAN:  1. Syncope, episodes brief in nature, with atypical features. Sounds non-cardiac with previous negative work-up, negative Holter, negative pacemaker interrogation. Currently in sinus rhythm. Troponin negative. Syncope likely of pulmonary origin.  2. Status post ICD implantation, with recent interrogation on 04/15/17 revealing ICD function with no delivered shocks, with underlying rhythm sinus rhythm at a rate of 80 bpm. 3. COPD, ongoing tobacco abuse 4. Hyperlipidemia 5. OSA, non-compliant with CPAP 6. Chronic systolic CHF, does not appear to be fluid overloaded 7. Cardiomyopathy, LV EF 30% per echocardiogram 04/13/17 8. CAD, with most recent nuclear stress test 04/19/17 revealing evidence of inferior and posterior scar without ischemia.  Recommendations 1. Continue current therapy. 2. Encourage patient stop smoking. 2. No further inpatient workup at this time  3. Recommend follow up with Dr. Clayborn Bigness as outpatient in 1 week; may consider LINQ implantable monitor for further evaluation.   Signed: Clabe Seal PA-C 05/26/2017, 8:12 AM

## 2017-05-26 NOTE — Plan of Care (Signed)
Familiar with his and his care.  Continues to smoke - not interesting in stopping.

## 2017-05-26 NOTE — H&P (Signed)
[facility] [place] NAME: John Frank    MR#:  [id number]  DATE OF BIRTH:  February 02, 1956  DATE OF ADMISSION:  05/25/2017  PRIMARY CARE PHYSICIAN: Idelle Crouch, MD   REQUESTING/REFERRING PHYSICIAN: Alfred Levins, MD  CHIEF COMPLAINT:   Chief Complaint  Patient presents with  . Weakness    HISTORY OF PRESENT ILLNESS:  John Frank  is a 62 y.o. male who presents with shortness of breath and "shaking episode".  Patient states that his shortness of breath has been getting worse over the past couple of days, he has a history of severe COPD, and has had similar exacerbations in the past.  Today he had an episode where he had some shaking activity, but he states that he was conscious throughout the episode and able to communicate the whole time.  Per his report and his family's report he nearly lost consciousness.  He states that he has been having some episodes where he has lost consciousness.  Here in the ED he was treated for COPD exacerbation, and hospitalist were called for admission  PAST MEDICAL HISTORY:   Past Medical History:  Diagnosis Date  . COPD (chronic obstructive pulmonary disease) ([place])   . Diabetes ([place])   . GERD (gastroesophageal reflux disease)   . Heart attack ([place])   . Hyperlipidemia   . Hypertension   . Presence of permanent cardiac pacemaker   . Shortness of breath dyspnea   . Sleep apnea     PAST SURGICAL HISTORY:   Past Surgical History:  Procedure Laterality Date  . CARDIAC SURGERY     defibrillator and pacemaker  . ESOPHAGOGASTRODUODENOSCOPY N/A 09/19/2014   Procedure: ESOPHAGOGASTRODUODENOSCOPY (EGD);  Surgeon: Lucilla Lame, MD;  Location: [facility] ENDOSCOPY;  Service: Endoscopy;  Laterality: N/A;    SOCIAL HISTORY:   Social History   Tobacco Use  . Smoking status: Current Every Day Smoker    Packs/day: 0.50    Years: 40.00    Pack years: 20.00    Types: Cigarettes  . Smokeless tobacco: Former  Network engineer Use Topics  . Alcohol use: No    FAMILY HISTORY:   Family History  Problem Relation Age of Onset  . Diabetes Mother   . Heart attack Father 75    DRUG ALLERGIES:  No Known Allergies  MEDICATIONS AT HOME:   Prior to Admission medications   Medication Sig Start Date End Date Taking? Authorizing Provider  aspirin EC 81 MG tablet Take 81 mg by mouth daily.   Yes [provider]  atorvastatin (LIPITOR) 40 MG tablet TAKE 1 TABLET (40 MG TOTAL) BY MOUTH DAILY. 04/10/17  Yes Kathrine Haddock, NP  budesonide-formoterol (SYMBICORT) 160-4.5 MCG/ACT inhaler Inhale 2 puffs into the lungs 2 (two) times daily. 06/15/15  Yes Kathrine Haddock, NP  buPROPion (WELLBUTRIN) 75 MG tablet Take 75 mg by mouth daily. 03/26/16 05/08/18 Yes [provider]  clopidogrel (PLAVIX) 75 MG tablet TAKE 1 TABLET (75 MG TOTAL) BY MOUTH DAILY. 12/08/16  Yes Kathrine Haddock, NP  enalapril (VASOTEC) 5 MG tablet TAKE 1 TABLET (5 MG TOTAL) BY MOUTH DAILY. NEED F/U APPT FOR ADDITIONAL REFILLS 08/07/16  Yes Kathrine Haddock, NP  famotidine (PEPCID) 40 MG tablet Take 1 tablet (40 mg total) by mouth daily. 05/08/17  Yes Darel Hong, MD  ipratropium-albuterol (DUONEB) 0.5-2.5 (3) MG/3ML SOLN Take 3 mLs by nebulization every 6 (six) hours as needed (wheezing; shortness of breath).   Yes [provider]  JANUVIA 100 MG tablet TAKE 1 TABLET (100 MG TOTAL) BY MOUTH DAILY. 04/10/17  Yes Kathrine Haddock, NP  levETIRAcetam (KEPPRA) 500 MG tablet Take 500 mg by mouth 2 (two) times daily.   Yes [provider]  metFORMIN (GLUCOPHAGE) 1000 MG tablet TAKE 1 TABLET (1,000 MG TOTAL) BY MOUTH 2 (TWO) TIMES DAILY WITH A MEAL. 09/12/16  Yes Kathrine Haddock, NP  metoprolol succinate (TOPROL-XL) 50 MG 24 hr tablet TAKE 1 TABLET (50 MG TOTAL) BY MOUTH DAILY. TAKE WITH OR IMMEDIATELY FOLLOWING A MEAL. 01/19/17  Yes Crissman, Jeannette How, MD  nitroGLYCERIN (NITROSTAT) 0.4 MG SL tablet Place 0.4 mg under the tongue  every 5 (five) minutes as needed for chest pain.   Yes [provider]  ONE TOUCH ULTRA TEST test strip USE TO TEST BLOOD SUGAR ONCE DAILY 10/07/16  Yes Johnson, Megan P, DO  pantoprazole (PROTONIX) 40 MG tablet TAKE 1 TABLET (40 MG TOTAL) BY MOUTH EVERY MORNING. 12/08/16  Yes Kathrine Haddock, NP  albuterol (PROVENTIL HFA;VENTOLIN HFA) 108 (90 Base) MCG/ACT inhaler Inhale 2 puffs into the lungs every 6 (six) hours as needed for wheezing or shortness of breath. 05/25/17   Merlyn Lot, MD  doxycycline (VIBRA-TABS) 100 MG tablet Take 1 tablet (100 mg total) by mouth 2 (two) times daily for 7 days. 05/25/17 06/01/17  Merlyn Lot, MD  predniSONE (DELTASONE) 10 MG tablet Take 1 tablet (10 mg total) by mouth daily. Day 1-2: Take 50 mg  ( 5 pills) Day 3-4 : Take 40 mg (4pills) Day 5-6: Take 30 mg (3 pills) Day 7-8:  Take 20 mg (2 pills) Day 9:  Take 10mg  (1 pill) 05/25/17   Merlyn Lot, MD    REVIEW OF SYSTEMS:  Review of Systems  Constitutional: Negative for chills, fever, malaise/fatigue and weight loss.  HENT: Negative for ear pain, hearing loss and tinnitus.   Eyes: Negative for blurred vision, double vision, pain and redness.  Respiratory: Positive for cough, shortness of breath and wheezing. Negative for hemoptysis.   Cardiovascular: Negative for chest pain, palpitations, orthopnea and leg swelling.  Gastrointestinal: Negative for abdominal pain, constipation, diarrhea, nausea and vomiting.  Genitourinary: Negative for dysuria, frequency and hematuria.  Musculoskeletal: Negative for back pain, joint pain and neck pain.  Skin:       No acne, rash, or lesions  Neurological: Negative for dizziness, tremors, focal weakness and weakness.       Near syncope  Endo/Heme/Allergies: Negative for polydipsia. Does not bruise/bleed easily.  Psychiatric/Behavioral: Negative for depression. The patient is not nervous/anxious and does not have insomnia.      VITAL SIGNS:   Vitals:    05/25/17 2130 05/25/17 2200 05/25/17 2230 05/25/17 2300  BP: 122/72 127/84 111/78 133/77  Pulse: 90 90 88 (!) 102  Resp: (!) 27 (!) 24 20 (!) 23  Temp:      TempSrc:      SpO2: 95% 93% 98% 92%  Weight:      Height:       Wt Readings from Last 3 Encounters:  05/25/17 63.5 kg (140 lb)  05/08/17 61.2 kg (135 lb)  02/02/17 70.3 kg (155 lb)    PHYSICAL EXAMINATION:  Physical Exam  Vitals reviewed. Constitutional: He is oriented to person, place, and time. He appears well-developed and well-nourished. No distress.  HENT:  Head: Normocephalic and atraumatic.  Mouth/Throat: Oropharynx is clear and moist.  Eyes: Conjunctivae and EOM are normal. Pupils are equal, round, and reactive to light. No scleral  icterus.  Neck: Normal range of motion. Neck supple. No JVD present. No thyromegaly present.  Cardiovascular: Normal rate, regular rhythm and intact distal pulses. Exam reveals no gallop and no friction rub.  No murmur heard. Respiratory: Effort normal. No respiratory distress. He has wheezes. He has no rales.  GI: Soft. Bowel sounds are normal. He exhibits no distension. There is no tenderness.  Musculoskeletal: Normal range of motion. He exhibits no edema.  No arthritis, no gout  Lymphadenopathy:    He has no cervical adenopathy.  Neurological: He is alert and oriented to person, place, and time. No cranial nerve deficit.  No dysarthria, no aphasia  Skin: Skin is warm and dry. No rash noted. No erythema.  Psychiatric: He has a normal mood and affect. His behavior is normal. Judgment and thought content normal.    LABORATORY PANEL:   CBC Recent Labs  Lab 05/25/17 1858  WBC 11.8*  HGB 11.6*  HCT 37.0*  PLT 374   ------------------------------------------------------------------------------------------------------------------  Chemistries  Recent Labs  Lab 05/25/17 1858  NA 133*  K 4.7  CL 96*  CO2 29  GLUCOSE 172*  BUN 16  CREATININE 1.02  CALCIUM 9.0  AST 27   ALT 23  ALKPHOS 108  BILITOT 1.3*   ------------------------------------------------------------------------------------------------------------------  Cardiac Enzymes Recent Labs  Lab 05/25/17 1858  TROPONINI <0.03   ------------------------------------------------------------------------------------------------------------------  RADIOLOGY:  Ct Head Wo Contrast  Result Date: 05/25/2017 CLINICAL DATA:  Possible seizure activity earlier this evening. Generalized weakness. EXAM: CT HEAD WITHOUT CONTRAST TECHNIQUE: Contiguous axial images were obtained from the base of the skull through the vertex without intravenous contrast. COMPARISON:  02/13/2016, 10/10/2011. FINDINGS: Brain: Ventricular system upper normal in size. No mass lesion. No midline shift. No acute hemorrhage or hematoma. No extra-axial fluid collections. No evidence of acute infarction. No interval change. Vascular: Moderate bilateral carotid siphon and mild bilateral vertebral artery atherosclerosis. No hyperdense vessel. Skull: No skull fracture or other focal osseous abnormality involving the skull. Sinuses/Orbits: Visualized paranasal sinuses, bilateral mastoid air cells and bilateral middle ear cavities well-aerated. Visualized orbits and globes normal in appearance. Other: Benign hyperdense Thornwaldt cyst in the nasopharynx. IMPRESSION: 1. No acute intracranial abnormality. 2. 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 Electronically Signed   By: Evangeline Dakin M.D.   On: 05/25/2017 19:31   Dg Chest Portable 1 View  Result Date: 05/25/2017 CLINICAL DATA:  Seizure like activity, productive cough EXAM: PORTABLE CHEST 1 VIEW COMPARISON:  02/02/2017 radiograph FINDINGS: Left-sided pacing device as before. No focal pulmonary infiltrate or effusion. Heart size upper limits of normal. No pneumothorax is seen. IMPRESSION: No active disease. Electronically Signed   By: Donavan Foil M.D.   On: 05/25/2017 19:20    EKG:    Orders placed or performed during the hospital encounter of 05/25/17  . ED EKG  . ED EKG    IMPRESSION AND PLAN:  Principal Problem:   Recurrent syncope -patient just had echocardiogram last month which shows some chronic systolic and diastolic heart failure.  EKG here tonight showed some ventricular trigeminy, raising concern for possible arrhythmia contributing to these episodes.  We will keep him on telemetry monitor tonight, and get a cardiology consult. Active Problems:   COPD exacerbation (HCC) -IV Solu-Medrol, azithromycin, PRN duo nebs and antitussive, continue home meds   CAD (coronary artery disease) -continue home medications   Diabetes mellitus without complication (HCC) -sliding scale insulin with corresponding glucose checks   Hypertension -continue home medications   Chronic combined systolic  and diastolic CHF (congestive heart failure) ([place]) -continue home meds, cardiology consult as above   GERD (gastroesophageal reflux disease) -home dose PPI   Hyperlipidemia -home dose statin  All the records are reviewed and case discussed with ED provider. Management plans discussed with the patient and/or family.  DVT PROPHYLAXIS: SubQ lovenox  GI PROPHYLAXIS: PPI  ADMISSION STATUS: Inpatient  CODE STATUS: Full Code Status History    Date Active Date Inactive Code Status Order ID Comments User Context   12/24/2014 07:28 12/25/2014 13:59 Full Code [id number]  Hillary Bow, MD ED      TOTAL TIME TAKING CARE OF THIS PATIENT: 45 minutes.   Shi Grose [place] 05/26/2017, 1:09 AM  [employer]  [phone]  CC: Primary care physician; Idelle Crouch, MD  Note:  This document was prepared using Dragon voice recognition software and may include unintentional dictation errors.

## 2017-05-26 NOTE — Plan of Care (Signed)
Oxygen saturation >90% on room air.  Non productive cough noted.

## 2017-05-26 NOTE — Discharge Summary (Signed)
[facility] NAME: John Frank    MR#:  [id number]  DATE OF BIRTH:  1955/10/10  DATE OF ADMISSION:  05/25/2017 ADMITTING PHYSICIAN: Lance Coon, MD  DATE OF DISCHARGE: 05/26/2017  1:10 PM  PRIMARY CARE PHYSICIAN: Idelle Crouch, MD    ADMISSION DIAGNOSIS:  Weakness [R53.1] Chronic obstructive pulmonary disease with acute exacerbation (HCC) [J44.1] Syncope, unspecified syncope type [R55]  DISCHARGE DIAGNOSIS:  Principal Problem:   Recurrent syncope Active Problems:   CAD (coronary artery disease)   GERD (gastroesophageal reflux disease)   Diabetes mellitus without complication (HCC)   Hyperlipidemia   HTN (hypertension)   COPD exacerbation (HCC)   Chronic combined systolic and diastolic CHF (congestive heart failure) ([place])   SECONDARY DIAGNOSIS:   Past Medical History:  Diagnosis Date  . COPD (chronic obstructive pulmonary disease) ([place])   . Diabetes ([place])   . GERD (gastroesophageal reflux disease)   . Heart attack ([place])   . Hyperlipidemia   . Hypertension   . Presence of permanent cardiac pacemaker   . Shortness of breath dyspnea   . Sleep apnea     HOSPITAL COURSE:   62 year old male with past medical history of COPD, obstructive sleep apnea, diabetes, GERD, previous history of MI, hypertension, hyperlipidemia who presented to the hospital due to syncope and also noted to be in mild COPD exacerbation.  1. Syncope-etiology is syncope remains unclear. Patient was observed overnight in the hospital had no arrhythmias. Patient was seen by cardiology and has had an extensive workup for syncope with his AICD interrogated last month. -As per cardiology did not recommend any further intervention. He would benefit from a LINQ device as an outpatient.  - He was not orthostatic, CT head was negative for acute pathology. He was ambulated and has had no further episodes of syncope and therefore now being discharged  home.  2. COPD exacerbation-patient was treated with IV steroids, scheduled DuoNeb's, and empiric antibiotics with Zithromax. -he has improved now he's being discharged on oral prednisone taper, doxycycline.  3. History of seizures-patient had no acute seizures while in the hospital. He will continue his Keppra.  4. Diabetes type 2 without complication-patient will continue his Januvia, metformin.  5. HyperLipidemia-patient will continue his atorvastatin.  DISCHARGE CONDITIONS:   Stable  CONSULTS OBTAINED:  Treatment Team:  Isaias Cowman, MD  DRUG ALLERGIES:  No Known Allergies  DISCHARGE MEDICATIONS:   Allergies as of 05/26/2017   No Known Allergies     Medication List    TAKE these medications   albuterol 108 (90 Base) MCG/ACT inhaler Commonly known as:  PROVENTIL HFA;VENTOLIN HFA Inhale 2 puffs into the lungs every 6 (six) hours as needed for wheezing or shortness of breath.   aspirin EC 81 MG tablet Take 81 mg by mouth daily.   atorvastatin 40 MG tablet Commonly known as:  LIPITOR TAKE 1 TABLET (40 MG TOTAL) BY MOUTH DAILY.   budesonide-formoterol 160-4.5 MCG/ACT inhaler Commonly known as:  SYMBICORT Inhale 2 puffs into the lungs 2 (two) times daily.   buPROPion 75 MG tablet Commonly known as:  WELLBUTRIN Take 75 mg by mouth daily.   clopidogrel 75 MG tablet Commonly known as:  PLAVIX TAKE 1 TABLET (75 MG TOTAL) BY MOUTH DAILY.   doxycycline 100 MG tablet Commonly known as:  VIBRA-TABS Take 1 tablet (100 mg total) by mouth 2 (two) times daily for 7 days.   enalapril 5 MG tablet Commonly known as:  VASOTEC TAKE 1 TABLET (5 MG TOTAL) BY MOUTH DAILY. NEED F/U APPT FOR ADDITIONAL REFILLS   famotidine 40 MG tablet Commonly known as:  PEPCID Take 1 tablet (40 mg total) by mouth daily.   ipratropium-albuterol 0.5-2.5 (3) MG/3ML Soln Commonly known as:  DUONEB Take 3 mLs by nebulization every 6 (six) hours as needed (wheezing; shortness of  breath).   JANUVIA 100 MG tablet Generic drug:  sitaGLIPtin TAKE 1 TABLET (100 MG TOTAL) BY MOUTH DAILY.   levETIRAcetam 500 MG tablet Commonly known as:  KEPPRA Take 500 mg by mouth 2 (two) times daily.   metFORMIN 1000 MG tablet Commonly known as:  GLUCOPHAGE TAKE 1 TABLET (1,000 MG TOTAL) BY MOUTH 2 (TWO) TIMES DAILY WITH A MEAL.   metoprolol succinate 50 MG 24 hr tablet Commonly known as:  TOPROL-XL TAKE 1 TABLET (50 MG TOTAL) BY MOUTH DAILY. TAKE WITH OR IMMEDIATELY FOLLOWING A MEAL.   nitroGLYCERIN 0.4 MG SL tablet Commonly known as:  NITROSTAT Place 0.4 mg under the tongue every 5 (five) minutes as needed for chest pain.   ONE TOUCH ULTRA TEST test strip Generic drug:  glucose blood USE TO TEST BLOOD SUGAR ONCE DAILY   pantoprazole 40 MG tablet Commonly known as:  PROTONIX TAKE 1 TABLET (40 MG TOTAL) BY MOUTH EVERY MORNING.   predniSONE 10 MG tablet Commonly known as:  DELTASONE Take 1 tablet (10 mg total) by mouth daily. Day 1-2: Take 50 mg  ( 5 pills) Day 3-4 : Take 40 mg (4pills) Day 5-6: Take 30 mg (3 pills) Day 7-8:  Take 20 mg (2 pills) Day 9:  Take 10mg  (1 pill)         DISCHARGE INSTRUCTIONS:   DIET:  Cardiac diet and Diabetic diet  DISCHARGE CONDITION:  Stable  ACTIVITY:  Activity as tolerated  OXYGEN:  Home Oxygen: No.   Oxygen Delivery: room air  DISCHARGE LOCATION:  home   If you experience worsening of your admission symptoms, develop shortness of breath, life threatening emergency, suicidal or homicidal thoughts you must seek medical attention immediately by calling 911 or calling your MD immediately  if symptoms less severe.  You Must read complete instructions/literature along with all the possible adverse reactions/side effects for all the Medicines you take and that have been prescribed to you. Take any new Medicines after you have completely understood and accpet all the possible adverse reactions/side effects.   Please  note  You were cared for by a hospitalist during your hospital stay. If you have any questions about your discharge medications or the care you received while you were in the hospital after you are discharged, you can call the unit and asked to speak with the hospitalist on call if the hospitalist that took care of you is not available. Once you are discharged, your primary care physician will handle any further medical issues. Please note that NO REFILLS for any discharge medications will be authorized once you are discharged, as it is imperative that you return to your primary care physician (or establish a relationship with a primary care physician if you do not have one) for your aftercare needs so that they can reassess your need for medications and monitor your lab values.     Today   No further episodes of syncope. Shortness of breath improved. Will d/c home today.   VITAL SIGNS:  Blood pressure 132/78, pulse 93, temperature 98.5 F (36.9 C), temperature source Oral, resp. rate 18, height 5'  4" (1.626 m), weight 70.1 kg (154 lb 9.6 oz), SpO2 99 %.  I/O:    Intake/Output Summary (Last 24 hours) at 05/26/2017 1533 Last data filed at 05/26/2017 1019 Gross per 24 hour  Intake 240 ml  Output 400 ml  Net -160 ml    PHYSICAL EXAMINATION:  GENERAL:  62 y.o.-year-old patient lying in the bed with no acute distress.  EYES: Pupils equal, round, reactive to light and accommodation. No scleral icterus. Extraocular muscles intact.  HEENT: Head atraumatic, normocephalic. Oropharynx and nasopharynx clear.  NECK:  Supple, no jugular venous distention. No thyroid enlargement, no tenderness.  LUNGS: Normal breath sounds bilaterally, no wheezing, rales,rhonchi. No use of accessory muscles of respiration.  CARDIOVASCULAR: S1, S2 normal. No murmurs, rubs, or gallops.  ABDOMEN: Soft, non-tender, non-distended. Bowel sounds present. No organomegaly or mass.  EXTREMITIES: No pedal edema, cyanosis, or  clubbing.  NEUROLOGIC: Cranial nerves II through XII are intact. No focal motor or sensory defecits b/l.  PSYCHIATRIC: The patient is alert and oriented x 3.  SKIN: No obvious rash, lesion, or ulcer.   DATA REVIEW:   CBC Recent Labs  Lab 05/26/17 0421  WBC 7.0  HGB 10.9*  HCT 33.3*  PLT 290    Chemistries  Recent Labs  Lab 05/25/17 1858 05/26/17 0421  NA 133* 134*  K 4.7 4.6  CL 96* 98*  CO2 29 26  GLUCOSE 172* 304*  BUN 16 14  CREATININE 1.02 0.89  CALCIUM 9.0 9.1  AST 27  --   ALT 23  --   ALKPHOS 108  --   BILITOT 1.3*  --     Cardiac Enzymes Recent Labs  Lab 05/25/17 1858  TROPONINI <0.03    Microbiology Results  Results for orders placed or performed during the hospital encounter of 12/24/14  Blood culture (routine x 2)     Status: None   Collection Time: 12/24/14  7:18 AM  Result Value Ref Range Status   Specimen Description BLOOD LEFT ARM  Final   Special Requests BOTTLES DRAWN AEROBIC AND ANAEROBIC  6CC  Final   Culture NO GROWTH 5 DAYS  Final   Report Status 12/29/2014 FINAL  Final  Blood culture (routine x 2)     Status: None   Collection Time: 12/24/14  7:25 AM  Result Value Ref Range Status   Specimen Description BLOOD RIGHT ASSIST CONTROL  Final   Special Requests   Final    BOTTLES DRAWN AEROBIC AND ANAEROBIC  AER 2CC ANA 1CC   Culture NO GROWTH 5 DAYS  Final   Report Status 12/29/2014 FINAL  Final    RADIOLOGY:  Ct Head Wo Contrast  Result Date: 05/25/2017 CLINICAL DATA:  Possible seizure activity earlier this evening. Generalized weakness. EXAM: CT HEAD WITHOUT CONTRAST TECHNIQUE: Contiguous axial images were obtained from the base of the skull through the vertex without intravenous contrast. COMPARISON:  02/13/2016, 10/10/2011. FINDINGS: Brain: Ventricular system upper normal in size. No mass lesion. No midline shift. No acute hemorrhage or hematoma. No extra-axial fluid collections. No evidence of acute infarction. No interval change.  Vascular: Moderate bilateral carotid siphon and mild bilateral vertebral artery atherosclerosis. No hyperdense vessel. Skull: No skull fracture or other focal osseous abnormality involving the skull. Sinuses/Orbits: Visualized paranasal sinuses, bilateral mastoid air cells and bilateral middle ear cavities well-aerated. Visualized orbits and globes normal in appearance. Other: Benign hyperdense Thornwaldt cyst in the nasopharynx. IMPRESSION: 1. No acute intracranial abnormality. 2. Benign Thornwaldt cyst in the  nasopharynx. Electronically Signed   By: Evangeline Dakin M.D.   On: 05/25/2017 19:31   Dg Chest Portable 1 View  Result Date: 05/25/2017 CLINICAL DATA:  Seizure like activity, productive cough EXAM: PORTABLE CHEST 1 VIEW COMPARISON:  02/02/2017 radiograph FINDINGS: Left-sided pacing device as before. No focal pulmonary infiltrate or effusion. Heart size upper limits of normal. No pneumothorax is seen. IMPRESSION: No active disease. Electronically Signed   By: Donavan Foil M.D.   On: 05/25/2017 19:20      Management plans discussed with the patient, family and they are in agreement.  CODE STATUS:     Code Status Orders  (From admission, onward)        Start     Ordered   05/26/17 0211  Full code  Continuous     05/26/17 0210    Code Status History    Date Active Date Inactive Code Status Order ID Comments User Context   12/24/2014 07:28 12/25/2014 13:59 Full Code [id number]  Hillary Bow, MD ED      TOTAL TIME TAKING CARE OF THIS PATIENT: 40 minutes.    Henreitta Leber M.D on 05/26/2017 at 3:33 PM  Between 7am to 6pm - Pager - [phone]  After 6pm go to [email or web address] - Proofreader  [facility]  Office  [phone]  CC: Primary care physician; Idelle Crouch, MD

## 2017-05-27 LAB — HIV ANTIBODY (ROUTINE TESTING W REFLEX): HIV Screen 4th Generation wRfx: NONREACTIVE

## 2017-06-02 ENCOUNTER — Ambulatory Visit: Payer: Medicare Other | Admitting: Physical Therapy

## 2017-06-03 ENCOUNTER — Ambulatory Visit: Payer: Medicare Other | Admitting: Gastroenterology

## 2017-06-07 ENCOUNTER — Other Ambulatory Visit: Payer: Self-pay | Admitting: Unknown Physician Specialty

## 2017-06-09 ENCOUNTER — Ambulatory Visit: Payer: Medicare Other | Admitting: Physical Therapy

## 2017-06-16 ENCOUNTER — Ambulatory Visit: Payer: Medicare Other | Admitting: Physical Therapy

## 2017-06-23 ENCOUNTER — Ambulatory Visit: Payer: Medicare Other | Admitting: Gastroenterology

## 2017-06-23 ENCOUNTER — Encounter: Payer: Self-pay | Admitting: Gastroenterology

## 2017-06-29 ENCOUNTER — Encounter: Payer: Self-pay | Admitting: Gastroenterology

## 2017-06-29 ENCOUNTER — Ambulatory Visit (INDEPENDENT_AMBULATORY_CARE_PROVIDER_SITE_OTHER): Payer: Medicare Other | Admitting: Gastroenterology

## 2017-06-29 VITALS — BP 131/87 | HR 87 | Temp 97.4°F | Ht 64.0 in | Wt 159.2 lb

## 2017-06-29 DIAGNOSIS — R1013 Epigastric pain: Secondary | ICD-10-CM

## 2017-06-29 DIAGNOSIS — K219 Gastro-esophageal reflux disease without esophagitis: Secondary | ICD-10-CM

## 2017-06-29 MED ORDER — PANTOPRAZOLE SODIUM 40 MG PO TBEC: 40.0000 mg | DELAYED_RELEASE_TABLET | ORAL | 1 refills | Status: DC

## 2017-06-29 NOTE — Progress Notes (Signed)
[name] MD, MRCP(U.K) [address]  [place]  [place], [place] [address]  Main: [phone]  Fax: [phone]   Gastroenterology Consultation  Referring Provider:     Idelle Crouch, MD Primary Care Physician:  Idelle Crouch, MD Primary Gastroenterologist:  Dr. [name]  Reason for Consultation:     Abdominal pain         HPI:   John Frank is a 62 y.o. y/o male referred for consultation & management  by Dr. Doy Hutching, Leonie Douglas, MD.    He has been referred for abdominal pain. EGD in 2016 - normal. Ct abdomen in 01/2017 sowed right inguinal hernia. Labs 05/2017 : Hb 10.9 and MCV 84 basic metabolic panel-normal. HIV negative . H/o GERD,COPD    Abdominal pain: Onset: over a month, ok before that , continious, wakes him up at night  Site :epigastrium  Radiation: localized  Severity :8-10/10 Nature of pain: like a knife  Aggravating factors: laying down  Relieving factors :does not know  Weight loss: no  NSAID use: no  PPI use :Protonix- unsure if he takes it  Gall bladder surgery: yes  Frequency of bowel movements: every 2-3 days  Change in bowel movements: no  Relief with bowel movements: no  Gas/Bloating/Abdominal distension: no    1 pack of cigarettes a day .  Recalls last colonoscopy cant recall.  Lots pf heartburn.     Past Medical History:  Diagnosis Date  . COPD (chronic obstructive pulmonary disease) ([place])   . Diabetes ([place])   . GERD (gastroesophageal reflux disease)   . Heart attack ([place])   . Hyperlipidemia   . Hypertension   . Presence of permanent cardiac pacemaker   . Shortness of breath dyspnea   . Sleep apnea     Past Surgical History:  Procedure Laterality Date  . CARDIAC SURGERY     defibrillator and pacemaker  . ESOPHAGOGASTRODUODENOSCOPY N/A 09/19/2014   Procedure: ESOPHAGOGASTRODUODENOSCOPY (EGD);  Surgeon: Lucilla Lame, MD;  Location[facility] ENDOSCOPY;  Service: Endoscopy;  Laterality: N/A;    Prior to Admission  medications   Medication Sig Start Date End Date Taking? Authorizing Provider  albuterol (PROVENTIL HFA;VENTOLIN HFA) 108 (90 Base) MCG/ACT inhaler Inhale 2 puffs into the lungs every 6 (six) hours as needed for wheezing or shortness of breath. 05/26/17  Yes Henreitta Leber, MD  aspirin EC 81 MG tablet Take 81 mg by mouth daily.   Yes [provider]  atorvastatin (LIPITOR) 40 MG tablet TAKE 1 TABLET (40 MG TOTAL) BY MOUTH DAILY. 04/10/17  Yes Kathrine Haddock, NP  budesonide-formoterol (SYMBICORT) 160-4.5 MCG/ACT inhaler Inhale 2 puffs into the lungs 2 (two) times daily. 06/15/15  Yes Kathrine Haddock, NP  buPROPion (WELLBUTRIN) 75 MG tablet Take 75 mg by mouth daily. 03/26/16 05/08/18 Yes [provider]  clopidogrel (PLAVIX) 75 MG tablet TAKE 1 TABLET (75 MG TOTAL) BY MOUTH DAILY. 12/08/16  Yes Kathrine Haddock, NP  enalapril (VASOTEC) 5 MG tablet TAKE 1 TABLET (5 MG TOTAL) BY MOUTH DAILY. NEED F/U APPT FOR ADDITIONAL REFILLS 08/07/16  Yes Kathrine Haddock, NP  famotidine (PEPCID) 40 MG tablet Take 1 tablet (40 mg total) by mouth daily. 05/08/17  Yes Darel Hong, MD  ipratropium-albuterol (DUONEB) 0.5-2.5 (3) MG/3ML SOLN Take 3 mLs by nebulization every 6 (six) hours as needed (wheezing; shortness of breath).   Yes [provider]  JANUVIA 100 MG tablet TAKE 1 TABLET (100 MG TOTAL) BY MOUTH DAILY. 04/10/17  Yes Kathrine Haddock, NP  levETIRAcetam (KEPPRA) 500 MG tablet Take 500 mg by mouth 2 (two) times daily.   Yes [provider]  metFORMIN (GLUCOPHAGE) 1000 MG tablet TAKE 1 TABLET (1,000 MG TOTAL) BY MOUTH 2 (TWO) TIMES DAILY WITH A MEAL. 09/12/16  Yes Kathrine Haddock, NP  metoprolol succinate (TOPROL-XL) 50 MG 24 hr tablet TAKE 1 TABLET (50 MG TOTAL) BY MOUTH DAILY. TAKE WITH OR IMMEDIATELY FOLLOWING A MEAL. 01/19/17  Yes Crissman, Jeannette How, MD  nitroGLYCERIN (NITROSTAT) 0.4 MG SL tablet Place 0.4 mg under the tongue every 5 (five) minutes as needed for chest pain.   Yes  [provider]  ONE TOUCH ULTRA TEST test strip USE TO TEST BLOOD SUGAR ONCE DAILY 10/07/16  Yes Johnson, Megan P, DO  pantoprazole (PROTONIX) 40 MG tablet TAKE 1 TABLET (40 MG TOTAL) BY MOUTH EVERY MORNING. 06/08/17  Yes Kathrine Haddock, NP  predniSONE (DELTASONE) 10 MG tablet Take 1 tablet (10 mg total) by mouth daily. Day 1-2: Take 50 mg  ( 5 pills) Day 3-4 : Take 40 mg (4pills) Day 5-6: Take 30 mg (3 pills) Day 7-8:  Take 20 mg (2 pills) Day 9:  Take 10mg  (1 pill) 05/26/17  Yes Sainani, Belia Heman, MD  sucralfate (CARAFATE) 1 g tablet Take by mouth. 05/15/17 05/15/18 Yes [provider]    Family History  Problem Relation Age of Onset  . Diabetes Mother   . Heart attack Father 52     Social History   Tobacco Use  . Smoking status: Current Every Day Smoker    Packs/day: 0.50    Years: 40.00    Pack years: 20.00    Types: Cigarettes  . Smokeless tobacco: Former Network engineer Use Topics  . Alcohol use: No  . Drug use: No    Allergies as of 06/29/2017  . (No Known Allergies)    Review of Systems:    All systems reviewed and negative except where noted in HPI.   Physical Exam:  BP 131/87 (BP Location: Left Arm, Patient Position: Sitting, Cuff Size: Normal)   Pulse 87   Temp (!) 97.4 F (36.3 C) (Oral)   Ht 5\' 4"  (1.626 m)   Wt 159 lb 3.2 oz (72.2 kg)   BMI 27.33 kg/m  No LMP for male patient. Psych:  Alert and cooperative. Normal mood and affect.Strong oodor of nicotine in the roo,  General:   Alert,  Well-developed, well-nourished, pleasant and cooperative in NAD Head:  Normocephalic and atraumatic. Eyes:  Sclera clear, no icterus.   Conjunctiva pink. Ears:  Normal auditory acuity. Nose:  No deformity, discharge, or lesions. Mouth:  No deformity or lesions,oropharynx pink & moist. Neck:  Supple; no masses or thyromegaly. Lungs:  Respirations even and unlabored.  Clear throughout to auscultation.   No wheezes, crackles, or rhonchi. No acute  distress. Heart:  Regular rate and rhythm; no murmurs, clicks, rubs, or gallops. Abdomen:  Normal bowel sounds.  No bruits.  Soft, non-tender and non-distended without masses, hepatosplenomegaly or hernias noted.  No guarding or rebound tenderness.    Extremities:  No clubbing or edema.  No cyanosis. Neurologic:  Alert and oriented x3;  grossly normal neurologically. Skin:  Intact without significant lesions or rashes. No jaundice. Lymph Nodes:  No significant cervical adenopathy. Psych:  Alert and cooperative. Normal mood and affect.  Imaging Studies: No results found.  Assessment and Plan:   John Frank is a 62 y.o. y/o male has  been referred for acute abdominal pain of 4 weeks duration. Long standing smoker. I do notice a normocytic anemia. He ha a lot of wheezing and feel at this time unsafe to proceed with any elective endoscopy .  Plan  1. Iron sudies, b12,folate ,ferritin to evaluate normocytic anemia. 2. amitiza daily for constipation - 2 weeks samples provided.  3. Stop smoking  4. EGD +colonoscopy to discuss at next visit if no better.  5. Stool H pylori antifgen  6. He has not taken Protonix which he was prescribed- suggest to start today  7. F/u in 3 weeks.    Dr [name] MD,MRCP(U.K)

## 2017-07-08 ENCOUNTER — Ambulatory Visit: Payer: Medicare Other | Admitting: Gastroenterology

## 2017-07-20 ENCOUNTER — Ambulatory Visit: Payer: Medicare Other | Admitting: Gastroenterology

## 2017-08-03 ENCOUNTER — Encounter: Payer: Self-pay | Admitting: Gastroenterology

## 2017-08-03 ENCOUNTER — Other Ambulatory Visit
Admission: RE | Admit: 2017-08-03 | Discharge: 2017-08-03 | Disposition: A | Discharge status: Home / Self Care | Payer: Medicare Other | Source: Ambulatory Visit | Attending: Gastroenterology | Admitting: Gastroenterology

## 2017-08-03 ENCOUNTER — Encounter (INDEPENDENT_AMBULATORY_CARE_PROVIDER_SITE_OTHER): Payer: Self-pay

## 2017-08-03 ENCOUNTER — Ambulatory Visit (INDEPENDENT_AMBULATORY_CARE_PROVIDER_SITE_OTHER): Payer: Medicare Other | Admitting: Gastroenterology

## 2017-08-03 VITALS — BP 119/82 | HR 90 | Ht 64.0 in | Wt 166.0 lb

## 2017-08-03 DIAGNOSIS — R1013 Epigastric pain: Secondary | ICD-10-CM | POA: Insufficient documentation

## 2017-08-03 LAB — COMPREHENSIVE METABOLIC PANEL
ALBUMIN: 4 g/dL (ref 3.5–5.0)
ALT: 15 U/L — AB (ref 17–63)
AST: 28 U/L (ref 15–41)
Alkaline Phosphatase: 88 U/L (ref 38–126)
Anion gap: 11 (ref 5–15)
BILIRUBIN TOTAL: 1.2 mg/dL (ref 0.3–1.2)
BUN: 11 mg/dL (ref 6–20)
CHLORIDE: 99 mmol/L — AB (ref 101–111)
CO2: 26 mmol/L (ref 22–32)
CREATININE: 0.87 mg/dL (ref 0.61–1.24)
Calcium: 9 mg/dL (ref 8.9–10.3)
GFR calc Af Amer: >60 mL/min (ref >60)
GLUCOSE: 121 mg/dL — AB (ref 65–99)
Potassium: 4.4 mmol/L (ref 3.5–5.1)
Sodium: 136 mmol/L (ref 135–145)
TOTAL PROTEIN: 7.3 g/dL (ref 6.5–8.1)

## 2017-08-03 LAB — IRON AND TIBC
Iron: 30 ug/dL — ABNORMAL LOW (ref 45–182)
Saturation Ratios: 8 % — ABNORMAL LOW (ref 17.9–39.5)
TIBC: 398 ug/dL (ref 250–450)
UIBC: 368 ug/dL

## 2017-08-03 LAB — VITAMIN B12: Vitamin B-12: 327 pg/mL (ref 180–914)

## 2017-08-03 LAB — FERRITIN: FERRITIN: 34 ng/mL (ref 24–336)

## 2017-08-03 LAB — FOLATE: FOLATE: 12.5 ng/mL (ref >5.9)

## 2017-08-03 NOTE — Progress Notes (Signed)
Patient states lower abdominal pain. He's unable to find a comfortable position to lay in at night.

## 2017-08-03 NOTE — Addendum Note (Signed)
Addended by: Leontine Locket Z on: 08/03/2017 02:00 PM   Modules accepted: Orders, SmartSet

## 2017-08-03 NOTE — Progress Notes (Signed)
[name] MD, MRCP(U.K) [address]  [place]  [place], [place] [address]  Main: [phone]  Fax: [phone]   Primary Care Physician: Idelle Crouch, MD  Primary Gastroenterologist:  Dr. [name]   No chief complaint on file.   HPI: John Frank is a 62 y.o. male    Summary of history :  He is here today to follow up to his initial visit on 06/29/17 when he was referred for abdominal pain for a month , like a knife non radiating in the epigastrium , worse when he lays down . In addition I noted normocytic anemia. Was wheezing a lot which would not permit any endoscopy at that time. I also treated him for constipation   EGD in 2016 - normal. Ct abdomen in 01/2017 sowed right inguinal hernia. Labs 05/2017 : Hb 10.9 and MCV 84 basic metabolic panel-normal. HIV negative . H/o GERD,COPD    Interval history   06/29/2017-  08/03/2017   He did not obtain any of the lab work I ordered.   Forgot to get the labs done. The amitiza did not work. At 2.30 in the morning his abdomen stops. Cant recall if he has had a colonoscopy in the past . No family history of colon cancer or polyps.    Taking his prilosec, says stool is soft.   Current Outpatient Medications  Medication Sig Dispense Refill  . albuterol (PROVENTIL HFA;VENTOLIN HFA) 108 (90 Base) MCG/ACT inhaler Inhale 2 puffs into the lungs every 6 (six) hours as needed for wheezing or shortness of breath. 1 Inhaler 2  . aspirin EC 81 MG tablet Take 81 mg by mouth daily.    Marland Kitchen atorvastatin (LIPITOR) 40 MG tablet TAKE 1 TABLET (40 MG TOTAL) BY MOUTH DAILY. 90 tablet 1  . budesonide-formoterol (SYMBICORT) 160-4.5 MCG/ACT inhaler Inhale 2 puffs into the lungs 2 (two) times daily. 1 Inhaler 3  . buPROPion (WELLBUTRIN) 75 MG tablet Take 75 mg by mouth daily.    . clopidogrel (PLAVIX) 75 MG tablet TAKE 1 TABLET (75 MG TOTAL) BY MOUTH DAILY. 90 tablet 3  . enalapril (VASOTEC) 5 MG tablet TAKE 1 TABLET (5 MG TOTAL) BY MOUTH  DAILY. NEED F/U APPT FOR ADDITIONAL REFILLS 30 tablet 3  . ipratropium-albuterol (DUONEB) 0.5-2.5 (3) MG/3ML SOLN Take 3 mLs by nebulization every 6 (six) hours as needed (wheezing; shortness of breath).    Marland Kitchen JANUVIA 100 MG tablet TAKE 1 TABLET (100 MG TOTAL) BY MOUTH DAILY. 90 tablet 1  . levETIRAcetam (KEPPRA) 500 MG tablet Take 500 mg by mouth 2 (two) times daily.    . metFORMIN (GLUCOPHAGE) 1000 MG tablet TAKE 1 TABLET (1,000 MG TOTAL) BY MOUTH 2 (TWO) TIMES DAILY WITH A MEAL. 180 tablet 1  . metoprolol succinate (TOPROL-XL) 50 MG 24 hr tablet TAKE 1 TABLET (50 MG TOTAL) BY MOUTH DAILY. TAKE WITH OR IMMEDIATELY FOLLOWING A MEAL. 90 tablet 1  . nitroGLYCERIN (NITROSTAT) 0.4 MG SL tablet Place 0.4 mg under the tongue every 5 (five) minutes as needed for chest pain.    . ONE TOUCH ULTRA TEST test strip USE TO TEST BLOOD SUGAR ONCE DAILY 100 each 3  . pantoprazole (PROTONIX) 40 MG tablet Take 1 tablet (40 mg total) by mouth every morning. 90 tablet 1  . sucralfate (CARAFATE) 1 g tablet Take by mouth.     No current facility-administered medications for this visit.     Allergies as of 08/03/2017  . (No  Known Allergies)    ROS:  General: Negative for anorexia, weight loss, fever, chills, fatigue, weakness. ENT: Negative for hoarseness, difficulty swallowing , nasal congestion. CV: Negative for chest pain, angina, palpitations, dyspnea on exertion, peripheral edema.  Respiratory: Negative for dyspnea at rest, dyspnea on exertion, cough, sputum, wheezing.  GI: See history of present illness. GU:  Negative for dysuria, hematuria, urinary incontinence, urinary frequency, nocturnal urination.  Endo: Negative for unusual weight change.    Physical Examination:   There were no vitals taken for this visit.  General: Well-nourished, well-developed in no acute distress.  Eyes: No icterus. Conjunctivae pink. Mouth: Oropharyngeal mucosa moist and pink , no lesions erythema or exudate. Lungs:  Clear to auscultation bilaterally. Few crackles in the upper lobes b/l in the suprascapular areas.  Heart: Regular rate and rhythm, no murmurs rubs or gallops.  Abdomen: Bowel sounds are normal, nontender, nondistended, no hepatosplenomegaly or masses, no abdominal bruits or hernia , no rebound or guarding.   Extremities: No lower extremity edema. No clubbing or deformities. Neuro: Alert and oriented x 3.  Grossly intact. Skin: Warm and dry, no jaundice.   Psych: Alert and cooperative, normal mood and affect.   Imaging Studies: No results found.  Assessment and Plan:   John Frank is a 62 y.o. y/o male here to follow up  for acute abdominal pain of 8 weeks duration. Long standing smoker. He has  a normocytic anemia. Did not obtain labs after last visit   Plan  1. Iron sudies, b12,folate ,ferritin to evaluate normocytic anemia. 2. Stop smoking  3. EGD +colonoscopy to discuss at next visit if no better.  4. Stool H pylori antifgen    I have discussed alternative options, risks & benefits,  which include, but are not limited to, bleeding, infection, perforation,respiratory complication & drug reaction.  The patient agrees with this plan & written consent will be obtained.    Dr [name]  MD,MRCP [facility]) Follow up in 6 weeks

## 2017-08-04 ENCOUNTER — Telehealth: Payer: Self-pay

## 2017-08-04 MED ORDER — PEG 3350-KCL-NA BICARB-NACL 420 G PO SOLR: 4000.0000 mL | Freq: Once | ORAL | 0 refills | Status: AC

## 2017-08-04 NOTE — Addendum Note (Signed)
Addended by: Peggye Ley on: 08/04/2017 11:32 AM   Modules accepted: Orders, SmartSet

## 2017-08-04 NOTE — Telephone Encounter (Signed)
Received medical clearance for  PLAVIX from Dr. Hollice Gong.   Stop: 4/3 Restart: 4/11  Advised patient and gave the exact dates

## 2017-08-05 ENCOUNTER — Telehealth: Payer: Self-pay

## 2017-08-05 NOTE — Telephone Encounter (Signed)
John Frank called this morning for clarification about the medication he's supposed to HOLD prior to his procedure.   The patient pulled all his medications while on the phone and looked through each one for both Clopidogrel or Plavix. He didn't find a bottle.   We advised the patient that Dr. Clayborn Bigness was expecting that he was taking this medication.   John Frank will contact Dr. Marianna Payment office about the medication/refill.   Sent an inbox message to both Dr. Clayborn Bigness and Dr. Doy Hutching concerning the patient's non-compliance.

## 2017-08-06 ENCOUNTER — Other Ambulatory Visit
Admission: RE | Admit: 2017-08-06 | Discharge: 2017-08-06 | Disposition: A | Discharge status: Home / Self Care | Payer: Medicare Other | Source: Ambulatory Visit | Attending: Gastroenterology | Admitting: Gastroenterology

## 2017-08-06 DIAGNOSIS — D649 Anemia, unspecified: Secondary | ICD-10-CM | POA: Diagnosis not present

## 2017-08-06 DIAGNOSIS — R1013 Epigastric pain: Secondary | ICD-10-CM | POA: Insufficient documentation

## 2017-08-07 LAB — H. PYLORI ANTIGEN, STOOL: H. Pylori Stool Ag, Eia: NEGATIVE

## 2017-08-09 ENCOUNTER — Emergency Department: Payer: Medicare Other

## 2017-08-09 ENCOUNTER — Other Ambulatory Visit: Payer: Self-pay

## 2017-08-09 ENCOUNTER — Encounter: Payer: Self-pay | Admitting: Radiology

## 2017-08-09 ENCOUNTER — Encounter: Payer: Self-pay | Admitting: Gastroenterology

## 2017-08-09 ENCOUNTER — Inpatient Hospital Stay
Admission: EM | Admit: 2017-08-09 | Discharge: 2017-08-11 | DRG: 291 | Disposition: A | Discharge status: Home Health | Payer: Medicare Other | Attending: Internal Medicine | Admitting: Internal Medicine

## 2017-08-09 DIAGNOSIS — I2510 Atherosclerotic heart disease of native coronary artery without angina pectoris: Secondary | ICD-10-CM | POA: Diagnosis present

## 2017-08-09 DIAGNOSIS — Z79899 Other long term (current) drug therapy: Secondary | ICD-10-CM

## 2017-08-09 DIAGNOSIS — J9601 Acute respiratory failure with hypoxia: Secondary | ICD-10-CM | POA: Diagnosis present

## 2017-08-09 DIAGNOSIS — J449 Chronic obstructive pulmonary disease, unspecified: Secondary | ICD-10-CM | POA: Diagnosis present

## 2017-08-09 DIAGNOSIS — R188 Other ascites: Secondary | ICD-10-CM | POA: Diagnosis present

## 2017-08-09 DIAGNOSIS — J90 Pleural effusion, not elsewhere classified: Secondary | ICD-10-CM

## 2017-08-09 DIAGNOSIS — Z7982 Long term (current) use of aspirin: Secondary | ICD-10-CM | POA: Diagnosis not present

## 2017-08-09 DIAGNOSIS — Z7984 Long term (current) use of oral hypoglycemic drugs: Secondary | ICD-10-CM | POA: Diagnosis not present

## 2017-08-09 DIAGNOSIS — K652 Spontaneous bacterial peritonitis: Secondary | ICD-10-CM | POA: Diagnosis present

## 2017-08-09 DIAGNOSIS — Z95810 Presence of automatic (implantable) cardiac defibrillator: Secondary | ICD-10-CM

## 2017-08-09 DIAGNOSIS — E785 Hyperlipidemia, unspecified: Secondary | ICD-10-CM | POA: Diagnosis present

## 2017-08-09 DIAGNOSIS — I509 Heart failure, unspecified: Secondary | ICD-10-CM

## 2017-08-09 DIAGNOSIS — I5023 Acute on chronic systolic (congestive) heart failure: Secondary | ICD-10-CM | POA: Diagnosis present

## 2017-08-09 DIAGNOSIS — E119 Type 2 diabetes mellitus without complications: Secondary | ICD-10-CM | POA: Diagnosis present

## 2017-08-09 DIAGNOSIS — K219 Gastro-esophageal reflux disease without esophagitis: Secondary | ICD-10-CM | POA: Diagnosis present

## 2017-08-09 DIAGNOSIS — Z8249 Family history of ischemic heart disease and other diseases of the circulatory system: Secondary | ICD-10-CM

## 2017-08-09 DIAGNOSIS — I110 Hypertensive heart disease with heart failure: Principal | ICD-10-CM | POA: Diagnosis present

## 2017-08-09 DIAGNOSIS — I5020 Unspecified systolic (congestive) heart failure: Secondary | ICD-10-CM

## 2017-08-09 DIAGNOSIS — R0902 Hypoxemia: Secondary | ICD-10-CM

## 2017-08-09 DIAGNOSIS — R1030 Lower abdominal pain, unspecified: Secondary | ICD-10-CM | POA: Diagnosis not present

## 2017-08-09 DIAGNOSIS — I252 Old myocardial infarction: Secondary | ICD-10-CM | POA: Diagnosis not present

## 2017-08-09 DIAGNOSIS — Z7951 Long term (current) use of inhaled steroids: Secondary | ICD-10-CM

## 2017-08-09 DIAGNOSIS — Z955 Presence of coronary angioplasty implant and graft: Secondary | ICD-10-CM | POA: Diagnosis not present

## 2017-08-09 DIAGNOSIS — R109 Unspecified abdominal pain: Secondary | ICD-10-CM

## 2017-08-09 DIAGNOSIS — F17210 Nicotine dependence, cigarettes, uncomplicated: Secondary | ICD-10-CM | POA: Diagnosis present

## 2017-08-09 HISTORY — DX: Atherosclerotic heart disease of native coronary artery without angina pectoris: I25.10

## 2017-08-09 HISTORY — DX: Heart failure, unspecified: I50.9

## 2017-08-09 LAB — COMPREHENSIVE METABOLIC PANEL
ALK PHOS: 92 U/L (ref 38–126)
ALT: 18 U/L (ref 17–63)
AST: 30 U/L (ref 15–41)
Albumin: 3.7 g/dL (ref 3.5–5.0)
Anion gap: 9 (ref 5–15)
BILIRUBIN TOTAL: 0.8 mg/dL (ref 0.3–1.2)
BUN: 14 mg/dL (ref 6–20)
CALCIUM: 9 mg/dL (ref 8.9–10.3)
CO2: 27 mmol/L (ref 22–32)
CREATININE: 0.86 mg/dL (ref 0.61–1.24)
Chloride: 101 mmol/L (ref 101–111)
Glucose, Bld: 153 mg/dL — ABNORMAL HIGH (ref 65–99)
Potassium: 4.5 mmol/L (ref 3.5–5.1)
Sodium: 137 mmol/L (ref 135–145)
Total Protein: 7.4 g/dL (ref 6.5–8.1)

## 2017-08-09 LAB — CBC WITH DIFFERENTIAL/PLATELET
Basophils Absolute: 0.1 10*3/uL (ref 0–0.1)
Basophils Relative: 1 %
Eosinophils Absolute: 0.1 10*3/uL (ref 0–0.7)
Eosinophils Relative: 1 %
HEMATOCRIT: 34.1 % — AB (ref 40.0–52.0)
HEMOGLOBIN: 10.9 g/dL — AB (ref 13.0–18.0)
LYMPHS ABS: 0.9 10*3/uL — AB (ref 1.0–3.6)
LYMPHS PCT: 12 %
MCH: 26.4 pg (ref 26.0–34.0)
MCHC: 32.1 g/dL (ref 32.0–36.0)
MCV: 82.5 fL (ref 80.0–100.0)
Monocytes Absolute: 0.7 10*3/uL (ref 0.2–1.0)
Monocytes Relative: 9 %
NEUTROS ABS: 6.1 10*3/uL (ref 1.4–6.5)
NEUTROS PCT: 77 %
Platelets: 277 10*3/uL (ref 150–440)
RBC: 4.13 MIL/uL — AB (ref 4.40–5.90)
RDW: 19.8 % — ABNORMAL HIGH (ref 11.5–14.5)
WBC: 7.9 10*3/uL (ref 3.8–10.6)

## 2017-08-09 LAB — GLUCOSE, CAPILLARY
GLUCOSE-CAPILLARY: 160 mg/dL — AB (ref 65–99)
GLUCOSE-CAPILLARY: 104 mg/dL — AB (ref 65–99)

## 2017-08-09 LAB — URINALYSIS, COMPLETE (UACMP) WITH MICROSCOPIC
BACTERIA UA: NONE SEEN
Bilirubin Urine: NEGATIVE
Glucose, UA: NEGATIVE mg/dL
Hgb urine dipstick: NEGATIVE
KETONES UR: NEGATIVE mg/dL
Leukocytes, UA: NEGATIVE
Nitrite: NEGATIVE
PROTEIN: NEGATIVE mg/dL
Specific Gravity, Urine: >1.046 — ABNORMAL HIGH (ref 1.005–1.030)
Squamous Epithelial / LPF: NONE SEEN
WBC UA: NONE SEEN WBC/hpf (ref 0–5)
pH: 5 (ref 5.0–8.0)

## 2017-08-09 LAB — LIPASE, BLOOD: Lipase: 47 U/L (ref 11–51)

## 2017-08-09 LAB — HEMOGLOBIN A1C
Hgb A1c MFr Bld: 7.1 % — ABNORMAL HIGH (ref 4.8–5.6)
Mean Plasma Glucose: 157.07 mg/dL

## 2017-08-09 LAB — TROPONIN I: Troponin I: <0.03 ng/mL (ref <0.03)

## 2017-08-09 LAB — BRAIN NATRIURETIC PEPTIDE: B Natriuretic Peptide: 756 pg/mL — ABNORMAL HIGH (ref 0.0–100.0)

## 2017-08-09 LAB — TSH: TSH: 2.557 u[IU]/mL (ref 0.350–4.500)

## 2017-08-09 LAB — PROTIME-INR
INR: 1.19
PROTHROMBIN TIME: 15 s (ref 11.4–15.2)

## 2017-08-09 MED ORDER — HEPARIN SODIUM (PORCINE) 5000 UNIT/ML IJ SOLN
5000.0000 [IU] | Freq: Three times a day (TID) | INTRAMUSCULAR | Status: DC
  Administered 2017-08-09 – 2017-08-10 (×3): 5000 [IU] via SUBCUTANEOUS
  Filled 2017-08-09 (×4): qty 1

## 2017-08-09 MED ORDER — LEVETIRACETAM 500 MG PO TABS
500.0000 mg | ORAL_TABLET | Freq: Two times a day (BID) | ORAL | Status: DC
  Administered 2017-08-09 – 2017-08-11 (×4): 500 mg via ORAL
  Filled 2017-08-09 (×4): qty 1

## 2017-08-09 MED ORDER — SODIUM CHLORIDE 0.9% FLUSH
3.0000 mL | Freq: Two times a day (BID) | INTRAVENOUS | Status: DC
  Administered 2017-08-09 – 2017-08-11 (×3): 3 mL via INTRAVENOUS

## 2017-08-09 MED ORDER — ONDANSETRON HCL 4 MG/2ML IJ SOLN
4.0000 mg | Freq: Once | INTRAMUSCULAR | Status: AC
  Administered 2017-08-09: 4 mg via INTRAVENOUS
  Filled 2017-08-09: qty 2

## 2017-08-09 MED ORDER — ASPIRIN EC 81 MG PO TBEC
81.0000 mg | DELAYED_RELEASE_TABLET | Freq: Every day | ORAL | Status: DC
  Administered 2017-08-10 – 2017-08-11 (×2): 81 mg via ORAL
  Filled 2017-08-09 (×2): qty 1

## 2017-08-09 MED ORDER — SODIUM CHLORIDE 0.9 % IV BOLUS
1000.0000 mL | Freq: Once | INTRAVENOUS | Status: AC
  Administered 2017-08-09: 1000 mL via INTRAVENOUS

## 2017-08-09 MED ORDER — ONDANSETRON HCL 4 MG/2ML IJ SOLN
4.0000 mg | Freq: Four times a day (QID) | INTRAMUSCULAR | Status: DC | PRN
  Administered 2017-08-10: 4 mg via INTRAVENOUS
  Filled 2017-08-09: qty 2

## 2017-08-09 MED ORDER — ACETAMINOPHEN 650 MG RE SUPP: 650.0000 mg | Freq: Four times a day (QID) | RECTAL | Status: DC | PRN

## 2017-08-09 MED ORDER — BUPROPION HCL 75 MG PO TABS
75.0000 mg | ORAL_TABLET | Freq: Every day | ORAL | Status: DC
  Administered 2017-08-09 – 2017-08-11 (×3): 75 mg via ORAL
  Filled 2017-08-09 (×3): qty 1

## 2017-08-09 MED ORDER — IPRATROPIUM-ALBUTEROL 0.5-2.5 (3) MG/3ML IN SOLN
3.0000 mL | Freq: Once | RESPIRATORY_TRACT | Status: AC
  Administered 2017-08-09: 3 mL via RESPIRATORY_TRACT
  Filled 2017-08-09: qty 3

## 2017-08-09 MED ORDER — IPRATROPIUM-ALBUTEROL 0.5-2.5 (3) MG/3ML IN SOLN
3.0000 mL | Freq: Four times a day (QID) | RESPIRATORY_TRACT | Status: DC
  Administered 2017-08-09 – 2017-08-11 (×6): 3 mL via RESPIRATORY_TRACT
  Filled 2017-08-09 (×8): qty 3

## 2017-08-09 MED ORDER — MOMETASONE FURO-FORMOTEROL FUM 200-5 MCG/ACT IN AERO
2.0000 | INHALATION_SPRAY | Freq: Two times a day (BID) | RESPIRATORY_TRACT | Status: DC
  Administered 2017-08-09 – 2017-08-11 (×4): 2 via RESPIRATORY_TRACT
  Filled 2017-08-09: qty 8.8

## 2017-08-09 MED ORDER — PANTOPRAZOLE SODIUM 40 MG PO TBEC
40.0000 mg | DELAYED_RELEASE_TABLET | ORAL | Status: DC
  Administered 2017-08-10 – 2017-08-11 (×2): 40 mg via ORAL
  Filled 2017-08-09 (×2): qty 1

## 2017-08-09 MED ORDER — CEFTRIAXONE SODIUM 2 G IJ SOLR
2.0000 g | INTRAMUSCULAR | Status: DC
Start: 2017-08-09 — End: 2017-08-11
  Administered 2017-08-10 – 2017-08-11 (×2): 2 g via INTRAVENOUS
  Filled 2017-08-09 (×3): qty 20

## 2017-08-09 MED ORDER — IOPAMIDOL (ISOVUE-300) INJECTION 61%
100.0000 mL | Freq: Once | INTRAVENOUS | Status: AC | PRN
  Administered 2017-08-09: 100 mL via INTRAVENOUS

## 2017-08-09 MED ORDER — FUROSEMIDE 10 MG/ML IJ SOLN
20.0000 mg | Freq: Once | INTRAMUSCULAR | Status: AC
  Administered 2017-08-09: 20 mg via INTRAVENOUS
  Filled 2017-08-09: qty 4

## 2017-08-09 MED ORDER — SUCRALFATE 1 G PO TABS
1.0000 g | ORAL_TABLET | Freq: Three times a day (TID) | ORAL | Status: DC
Start: 2017-08-09 — End: 2017-08-11
  Administered 2017-08-09 – 2017-08-11 (×5): 1 g via ORAL
  Filled 2017-08-09 (×6): qty 1

## 2017-08-09 MED ORDER — INSULIN ASPART 100 UNIT/ML ~~LOC~~ SOLN
0.0000 [IU] | Freq: Three times a day (TID) | SUBCUTANEOUS | Status: DC
  Administered 2017-08-09 – 2017-08-10 (×2): 2 [IU] via SUBCUTANEOUS
  Administered 2017-08-11: 3 [IU] via SUBCUTANEOUS
  Filled 2017-08-09 (×3): qty 1

## 2017-08-09 MED ORDER — ALBUTEROL SULFATE (2.5 MG/3ML) 0.083% IN NEBU
2.5000 mg | INHALATION_SOLUTION | Freq: Once | RESPIRATORY_TRACT | Status: AC
  Administered 2017-08-09: 2.5 mg via RESPIRATORY_TRACT
  Filled 2017-08-09: qty 3

## 2017-08-09 MED ORDER — METOPROLOL SUCCINATE ER 50 MG PO TB24
50.0000 mg | ORAL_TABLET | Freq: Every day | ORAL | Status: DC
  Administered 2017-08-09 – 2017-08-11 (×3): 50 mg via ORAL
  Filled 2017-08-09 (×3): qty 1

## 2017-08-09 MED ORDER — LINAGLIPTIN 5 MG PO TABS
5.0000 mg | ORAL_TABLET | Freq: Every day | ORAL | Status: DC
  Administered 2017-08-09 – 2017-08-11 (×3): 5 mg via ORAL
  Filled 2017-08-09 (×3): qty 1

## 2017-08-09 MED ORDER — INSULIN ASPART 100 UNIT/ML ~~LOC~~ SOLN: 0.0000 [IU] | Freq: Every day | SUBCUTANEOUS | Status: DC

## 2017-08-09 MED ORDER — MORPHINE SULFATE (PF) 4 MG/ML IV SOLN
4.0000 mg | Freq: Once | INTRAVENOUS | Status: AC
  Administered 2017-08-09: 4 mg via INTRAVENOUS
  Filled 2017-08-09: qty 1

## 2017-08-09 MED ORDER — ENALAPRIL MALEATE 5 MG PO TABS
5.0000 mg | ORAL_TABLET | Freq: Every day | ORAL | Status: DC
  Administered 2017-08-09 – 2017-08-11 (×3): 5 mg via ORAL
  Filled 2017-08-09 (×3): qty 1

## 2017-08-09 MED ORDER — ACETAMINOPHEN 325 MG PO TABS
650.0000 mg | ORAL_TABLET | Freq: Four times a day (QID) | ORAL | Status: DC | PRN
  Filled 2017-08-09 (×2): qty 2

## 2017-08-09 MED ORDER — NITROGLYCERIN 0.4 MG SL SUBL: 0.4000 mg | SUBLINGUAL_TABLET | SUBLINGUAL | Status: DC | PRN

## 2017-08-09 MED ORDER — ONDANSETRON HCL 4 MG PO TABS: 4.0000 mg | ORAL_TABLET | Freq: Four times a day (QID) | ORAL | Status: DC | PRN

## 2017-08-09 MED ORDER — FUROSEMIDE 10 MG/ML IJ SOLN
40.0000 mg | Freq: Two times a day (BID) | INTRAMUSCULAR | Status: DC
  Administered 2017-08-09 – 2017-08-11 (×4): 40 mg via INTRAVENOUS
  Filled 2017-08-09 (×4): qty 4

## 2017-08-09 NOTE — ED Notes (Signed)
Pt to CT

## 2017-08-09 NOTE — ED Provider Notes (Signed)
CT shows moderate ascites and small right pleural effusion. On reexam patient is not tender in his abdomen at present. He and his wife says he's been having pain off and on for about a month. A set at present there is no tenderness. He does complain however that he has not been able to urinate since 4 this morning and he feels the need to urinate now. He is not tender over his bladder either though.CT does not show markedly enlarged bladder. We will get his chest x-ray done do a bladder scan and see how much urine he has in there.  chest x-ray is read as no acute disease.  CT of the abdomen showed a pleural effusion. BNP is elevated. Patient's O2 sats are 85 on room air. in view of this and the fact that he has some cephalization on his chest x-ray I am wondering if he has some congestive failure.his lungs are clear howeverWe will try some Lasix and if that does not improve his oxygen saturation and help him with his urine output we will plan on admitting him.  after 3 DuoNeb so Lasix patient is still hypoxic with O2 sats go down to 82% on room air. He is not wheezing. He has a new pleural effusion seen on CT scan and new ascites. He is not having any abdominal pain now. I will put him in the hospital for management of his hypoxia and evaluation of his other problems.   Nena Polio, MD 08/09/17 [phone]

## 2017-08-09 NOTE — ED Triage Notes (Signed)
Patient reports abdominal pain for 2 months.  Has colonoscopy scheduled on 08/17/17.

## 2017-08-09 NOTE — ED Provider Notes (Signed)
[facility] Emergency Department Provider Note  ___________________________________________   First MD Initiated Contact with Patient 08/09/17 [phone]     (approximate)  I have reviewed the triage vital signs and the nursing notes.   HISTORY  Chief Complaint Abdominal Pain   HPI John Frank is a 62 y.o. male with a history of a hernia, COPD and diabetes who is presenting to the emergency department today with 4 months of worsening abdominal pain as well as abdominal distention.  He says that he has been seen by Dr. Vicente Males of GI in the clinic who is recommended a colonoscopy and endoscopy in the next several weeks.  He has tried sucralfate without relief for his abdominal pain but says that his pain is lower abdominal pain tonight which has been worsening over the course of the morning.  He says that he is presented to the emergency department now because he was unable to sleep last night.  He says the pain is sharp and nonradiating.  Does not report any burning with urination.  Says that he just had a bowel movement prior to me talking to him that was slightly loose but without any blood.  Said that he also had 2 episodes of dry heaving overnight.   Past Medical History:  Diagnosis Date  . CAD (coronary artery disease)    s/p stents in 2017  . CHF (congestive heart failure) ([place])   . COPD (chronic obstructive pulmonary disease) ([place])   . Diabetes ([place])   . GERD (gastroesophageal reflux disease)   . Heart attack ([place])   . Hyperlipidemia   . Hypertension   . Presence of permanent cardiac pacemaker   . Shortness of breath dyspnea   . Sleep apnea     Patient Active Problem List   Diagnosis Date Noted  . CHF exacerbation ([place]) 08/09/2017  . Recurrent syncope 05/26/2017  . Chronic combined systolic and diastolic CHF (congestive heart failure) ([place]) 05/26/2017  . Faintness 01/11/2016  . Obesity 01/01/2016  . Tobacco abuse 04/06/2015  . HTN (hypertension)  11/03/2014  . Sleep apnea 10/26/2014  . CAD (coronary artery disease) 10/26/2014  . GERD (gastroesophageal reflux disease) 10/26/2014  . Polyp of colon 10/26/2014  . Diabetes mellitus without complication ([place]) 43/32/9518  . Hyperlipidemia 10/26/2014  . COPD, severe ([place]) 10/26/2014  . Status cardiac pacemaker 10/26/2014  . Lesion of nasal septum 10/26/2014    Past Surgical History:  Procedure Laterality Date  . cardiac stents    . CARDIAC SURGERY     defibrillator and pacemaker  . ESOPHAGOGASTRODUODENOSCOPY N/A 09/19/2014   Procedure: ESOPHAGOGASTRODUODENOSCOPY (EGD);  Surgeon: Lucilla Lame, MD;  Location: [facility] ENDOSCOPY;  Service: Endoscopy;  Laterality: N/A;    Prior to Admission medications   Medication Sig Start Date End Date Taking? Authorizing Provider  albuterol (PROVENTIL HFA;VENTOLIN HFA) 108 (90 Base) MCG/ACT inhaler Inhale 2 puffs into the lungs every 6 (six) hours as needed for wheezing or shortness of breath. 05/26/17  Yes Henreitta Leber, MD  aspirin EC 81 MG tablet Take 81 mg by mouth daily.   Yes [provider]  atorvastatin (LIPITOR) 40 MG tablet TAKE 1 TABLET (40 MG TOTAL) BY MOUTH DAILY. 04/10/17  Yes Kathrine Haddock, NP  budesonide-formoterol (SYMBICORT) 160-4.5 MCG/ACT inhaler Inhale 2 puffs into the lungs 2 (two) times daily. Patient taking differently: Inhale 2 puffs into the lungs daily.  06/15/15  Yes Kathrine Haddock, NP  buPROPion (WELLBUTRIN) 75 MG tablet Take 75 mg by mouth  daily. 03/26/16 05/08/18 Yes [provider]  enalapril (VASOTEC) 5 MG tablet TAKE 1 TABLET (5 MG TOTAL) BY MOUTH DAILY. NEED F/U APPT FOR ADDITIONAL REFILLS 08/07/16  Yes Kathrine Haddock, NP  ipratropium-albuterol (DUONEB) 0.5-2.5 (3) MG/3ML SOLN Take 3 mLs by nebulization every 6 (six) hours as needed (wheezing; shortness of breath).   Yes [provider]  JANUVIA 100 MG tablet TAKE 1 TABLET (100 MG TOTAL) BY MOUTH DAILY. 04/10/17  Yes Kathrine Haddock, NP  metFORMIN  (GLUCOPHAGE) 1000 MG tablet TAKE 1 TABLET (1,000 MG TOTAL) BY MOUTH 2 (TWO) TIMES DAILY WITH A MEAL. 09/12/16  Yes Kathrine Haddock, NP  metoprolol succinate (TOPROL-XL) 50 MG 24 hr tablet TAKE 1 TABLET (50 MG TOTAL) BY MOUTH DAILY. TAKE WITH OR IMMEDIATELY FOLLOWING A MEAL. 01/19/17  Yes Crissman, Jeannette How, MD  nitroGLYCERIN (NITROSTAT) 0.4 MG SL tablet Place 0.4 mg under the tongue every 5 (five) minutes as needed for chest pain.   Yes [provider]  pantoprazole (PROTONIX) 40 MG tablet Take 1 tablet (40 mg total) by mouth every morning. 06/29/17  Yes [name], MD  sucralfate (CARAFATE) 1 g tablet Take 1 g by mouth 4 (four) times daily -  with meals and at bedtime.  05/15/17 05/15/18 Yes [provider]  ciprofloxacin (CIPRO) 500 MG tablet Take 1 tablet (500 mg total) by mouth 2 (two) times daily. 08/12/17   Loletha Grayer, MD  clopidogrel (PLAVIX) 75 MG tablet TAKE 1 TABLET (75 MG TOTAL) BY MOUTH DAILY. Patient not taking: Reported on 08/09/2017 12/08/16   Kathrine Haddock, NP  furosemide (LASIX) 40 MG tablet Take 1 tablet (40 mg total) by mouth daily. 08/11/17   Loletha Grayer, MD  levETIRAcetam (KEPPRA) 500 MG tablet Take 500 mg by mouth 2 (two) times daily.    [provider]  ONE TOUCH ULTRA TEST test strip USE TO TEST BLOOD SUGAR ONCE DAILY 10/07/16   Park Liter P, DO  spironolactone (ALDACTONE) 25 MG tablet Take 1 tablet (25 mg total) by mouth daily. 08/12/17   Loletha Grayer, MD    Allergies Patient has no known allergies.  Family History  Problem Relation Age of Onset  . Diabetes Mother   . Heart attack Father 59    Social History Social History   Tobacco Use  . Smoking status: Current Every Day Smoker    Packs/day: 1.00    Years: 40.00    Pack years: 40.00    Types: Cigarettes  . Smokeless tobacco: Former Network engineer Use Topics  . Alcohol use: No  . Drug use: No    Review of Systems  Constitutional: No fever/chills Eyes: No visual  changes. ENT: No sore throat. Cardiovascular: Denies chest pain. Respiratory: Denies shortness of breath. Gastrointestinal:   No constipation. Genitourinary: Negative for dysuria. Musculoskeletal: Negative for back pain. Skin: Negative for rash. Neurological: Negative for headaches, focal weakness or numbness.   ____________________________________________   PHYSICAL EXAM:  VITAL SIGNS: ED Triage Vitals [08/09/17 0516]  Enc Vitals Group     BP 126/81     Pulse Rate 91     Resp (!) 2     Temp 97.8 F (36.6 C)     Temp Source Oral     SpO2 94 %     Weight      Height      Head Circumference      Peak Flow      Pain Score 10     Pain Loc  Pain Edu?      Excl. in [place]?     Constitutional: Alert and oriented. Well appearing and in no acute distress. Eyes: Conjunctivae are normal.  Head: Atraumatic. Nose: No congestion/rhinnorhea. Mouth/Throat: Mucous membranes are moist.  Neck: No stridor.   Cardiovascular: Normal rate, regular rhythm. Grossly normal heart sounds.   Respiratory: Normal respiratory effort.  No retractions. Lungs CTAB. Gastrointestinal: Soft with moderate tenderness to palpation across the lower abdomen as well as to the right upper quadrant with a negative Murphy sign.  No distention. No CVA tenderness. Musculoskeletal: No lower extremity tenderness nor edema.  No joint effusions. Neurologic:  Normal speech and language. No gross focal neurologic deficits are appreciated. Skin:  Skin is warm, dry and intact. No rash noted. Psychiatric: Mood and affect are normal. Speech and behavior are normal.  ____________________________________________   LABS (all labs ordered are listed, but only abnormal results are displayed)  Labs Reviewed  CBC WITH DIFFERENTIAL/PLATELET - Abnormal; Notable for the following components:      Result Value   RBC 4.13 (*)    Hemoglobin 10.9 (*)    HCT 34.1 (*)    RDW 19.8 (*)    Lymphs Abs 0.9 (*)    All other  components within normal limits  COMPREHENSIVE METABOLIC PANEL - Abnormal; Notable for the following components:   Glucose, Bld 153 (*)    All other components within normal limits  URINALYSIS, COMPLETE (UACMP) WITH MICROSCOPIC - Abnormal; Notable for the following components:   Color, Urine YELLOW (*)    APPearance CLEAR (*)    Specific Gravity, Urine >1.046 (*)    All other components within normal limits  BRAIN NATRIURETIC PEPTIDE - Abnormal; Notable for the following components:   B Natriuretic Peptide 756.0 (*)    All other components within normal limits  HEMOGLOBIN A1C - Abnormal; Notable for the following components:   Hgb A1c MFr Bld 7.1 (*)    All other components within normal limits  GLUCOSE, CAPILLARY - Abnormal; Notable for the following components:   Glucose-Capillary 160 (*)    All other components within normal limits  BASIC METABOLIC PANEL - Abnormal; Notable for the following components:   Chloride 100 (*)    Glucose, Bld 139 (*)    Calcium 8.6 (*)    All other components within normal limits  CBC - Abnormal; Notable for the following components:   RBC 4.13 (*)    Hemoglobin 10.7 (*)    HCT 34.4 (*)    MCH 25.9 (*)    MCHC 31.1 (*)    RDW 19.6 (*)    All other components within normal limits  GLUCOSE, CAPILLARY - Abnormal; Notable for the following components:   Glucose-Capillary 104 (*)    All other components within normal limits  GLUCOSE, CAPILLARY - Abnormal; Notable for the following components:   Glucose-Capillary 176 (*)    All other components within normal limits  GLUCOSE, CAPILLARY - Abnormal; Notable for the following components:   Glucose-Capillary 123 (*)    All other components within normal limits  LACTATE DEHYDROGENASE, PLEURAL OR PERITONEAL FLUID - Abnormal; Notable for the following components:   LD, Fluid 76 (*)    All other components within normal limits  BODY FLUID CELL COUNT WITH DIFFERENTIAL - Abnormal; Notable for the following  components:   Color, Fluid STRAW (*)    All other components within normal limits  GLUCOSE, CAPILLARY - Abnormal; Notable for the following components:   Glucose-Capillary  114 (*)    All other components within normal limits  BASIC METABOLIC PANEL - Abnormal; Notable for the following components:   Chloride 98 (*)    CO2 33 (*)    Glucose, Bld 124 (*)    Calcium 8.5 (*)    All other components within normal limits  GLUCOSE, CAPILLARY - Abnormal; Notable for the following components:   Glucose-Capillary 114 (*)    All other components within normal limits  GLUCOSE, CAPILLARY - Abnormal; Notable for the following components:   Glucose-Capillary 218 (*)    All other components within normal limits  BODY FLUID CULTURE  FUNGUS CULTURE WITH STAIN  ACID FAST CULTURE WITH REFLEXED SENSITIVITIES  ACID FAST SMEAR (AFB)  LIPASE, BLOOD  PROTIME-INR  TROPONIN I  TROPONIN I  TROPONIN I  TSH  TRIGLYCERIDES, BODY FLUIDS  AMYLASE, PLEURAL OR PERITONEAL FLUID   ALBUMIN, PLEURAL OR PERITONEAL FLUID  PROTEIN, PLEURAL OR PERITONEAL FLUID  GLUCOSE, PLEURAL OR PERITONEAL FLUID  PATHOLOGIST SMEAR REVIEW  TOTAL BILIRUBIN, BODY FLUID  CYTOLOGY - NON PAP   ____________________________________________  EKG   ____________________________________________  RADIOLOGY  Pending CT scan ____________________________________________   PROCEDURES  Procedure(s) performed:   Procedures  Critical Care performed:   ____________________________________________   INITIAL IMPRESSION / ASSESSMENT AND PLAN / ED COURSE  Pertinent labs & imaging results that were available during my care of the patient were reviewed by me and considered in my medical decision making (see chart for details).  Differential diagnosis includes, but is not limited to, acute appendicitis, renal colic, testicular torsion, urinary tract infection/pyelonephritis, prostatitis,  epididymitis, diverticulitis,  small bowel obstruction or ileus, colitis, abdominal aortic aneurysm, gastroenteritis, hernia, etc. As part of my medical decision making, I reviewed the following data within the [place] chart reviewed  Patient signed out the morning of Sunday, March 31 at 7 AM to Dr. Cinda Quest pending CT scan. ____________________________________________   FINAL CLINICAL IMPRESSION(S) / ED DIAGNOSES  Final diagnoses:  Hypoxia  Other ascites  Systolic congestive heart failure, unspecified HF chronicity ([place])  Chronic obstructive pulmonary disease, unspecified COPD type ([place])  Intermittent abdominal pain      NEW MEDICATIONS STARTED DURING THIS VISIT:  Discharge Medication List as of 08/11/2017 11:33 AM    START taking these medications   Details  ciprofloxacin (CIPRO) 500 MG tablet Take 1 tablet (500 mg total) by mouth 2 (two) times daily., Starting Wed 08/12/2017, Normal    furosemide (LASIX) 40 MG tablet Take 1 tablet (40 mg total) by mouth daily., Starting Tue 08/11/2017, Normal    spironolactone (ALDACTONE) 25 MG tablet Take 1 tablet (25 mg total) by mouth daily., Starting Wed 08/12/2017, Normal         Note:  This document was prepared using Dragon voice recognition software and may include unintentional dictation errors.     Orbie Pyo, MD 08/11/17 1515

## 2017-08-09 NOTE — ED Notes (Signed)
Patient spilled the urinal in the bed. Linens were changed by student and new gown and ED underwear given to the patient. Patient's significant other assisted the patient in changing clothes.

## 2017-08-09 NOTE — ED Notes (Signed)
Pt encouraged to give urine specimen, urinal given to patient again.

## 2017-08-09 NOTE — Progress Notes (Signed)
Pharmacy Antibiotic Note  LETICIA MCDIARMID is a 62 y.o. male admitted on 08/09/2017 with intra abdominal infection.  Pharmacy has been consulted for ceftriaxone dosing.  Plan: ceftriaxone 2gm iv q24h      Temp (24hrs), Avg:98 F (36.7 C), Min:97.8 F (36.6 C), Max:98.1 F (36.7 C)  Recent Labs  Lab 08/03/17 1442 08/09/17 0626  WBC  --  7.9  CREATININE 0.87 0.86    Estimated Creatinine Clearance: 82.6 mL/min (by C-G formula based on SCr of 0.86 mg/dL).    No Known Allergies  Antimicrobials this admission: Anti-infectives (From admission, onward)   Start     Dose/Rate Route Frequency Ordered Stop   08/09/17 1245  cefTRIAXone (ROCEPHIN) 2 g in sodium chloride 0.9 % 100 mL IVPB     2 g 200 mL/hr over 30 Minutes Intravenous Every 24 hours 08/09/17 1235         Microbiology results: No results found for this or any previous visit (from the past 240 hour(s)).  Thank you for allowing pharmacy to be a part of this patient's care.  Donna Christen Khandi Kernes 08/09/2017 12:35 PM

## 2017-08-09 NOTE — ED Notes (Addendum)
Pt taken off 2L [place] and placed on RA. Pt sats went from 98% to 85%. Pt placed back on 2L via [place] and MD informed.

## 2017-08-09 NOTE — H&P (Signed)
[facility] [place] NAME: John Frank    MR#:  [id number]  DATE OF BIRTH:  December 15, 1955  DATE OF ADMISSION:  08/09/2017  PRIMARY CARE PHYSICIAN: Idelle Crouch, MD   REQUESTING/REFERRING PHYSICIAN: Dr. Conni Slipper  CHIEF COMPLAINT:   Chief Complaint  Patient presents with  . Abdominal Pain    HISTORY OF PRESENT ILLNESS:  John Frank  is a 62 y.o. male with a known history of CAD, ischemic cardiomyopathy with EF of 25% status post defibrillator and pacemaker, COPD not on home oxygen, diabetes, hypertension presents to hospital secondary to worsening abdominal pain and shortness of breath. Patient has been having abdominal pain for a few months now.  He has seen a GI as outpatient and was in the process of getting EGD and colonoscopy done.  His CT of the abdomen done few months ago was normal at the time.  His abdomen over the last week does appear to be more distended according to his wife, and he has been having intermittent episodes of mid abdominal pain which was worse last night so came to the emergency room.  He also noticed that he has 1-2+ ankle edema and some shortness of breath.  He was hypoxic here with sats 86% on room air and so placed on oxygen.  CT of the abdomen showing moderate ascites.  Denies any fevers or chills.  Mild nausea but no vomiting or diarrhea.  Denies any chest pain, cold cough or congestion.  Also has right pleural effusion.  PAST MEDICAL HISTORY:   Past Medical History:  Diagnosis Date  . CAD (coronary artery disease)    s/p stents in 2017  . CHF (congestive heart failure) ([place])   . COPD (chronic obstructive pulmonary disease) ([place])   . Diabetes ([place])   . GERD (gastroesophageal reflux disease)   . Heart attack ([place])   . Hyperlipidemia   . Hypertension   . Presence of permanent cardiac pacemaker   . Shortness of breath dyspnea   . Sleep apnea     PAST SURGICAL HISTORY:   Past Surgical History:    Procedure Laterality Date  . cardiac stents    . CARDIAC SURGERY     defibrillator and pacemaker  . ESOPHAGOGASTRODUODENOSCOPY N/A 09/19/2014   Procedure: ESOPHAGOGASTRODUODENOSCOPY (EGD);  Surgeon: Lucilla Lame, MD;  Location: [facility] ENDOSCOPY;  Service: Endoscopy;  Laterality: N/A;    SOCIAL HISTORY:   Social History   Tobacco Use  . Smoking status: Current Every Day Smoker    Packs/day: 1.00    Years: 40.00    Pack years: 40.00    Types: Cigarettes  . Smokeless tobacco: Former Network engineer Use Topics  . Alcohol use: No    FAMILY HISTORY:   Family History  Problem Relation Age of Onset  . Diabetes Mother   . Heart attack Father 53    DRUG ALLERGIES:  No Known Allergies  REVIEW OF SYSTEMS:   Review of Systems  Constitutional: Negative for chills, fever, malaise/fatigue and weight loss.  HENT: Negative for ear discharge, ear pain, hearing loss and nosebleeds.   Eyes: Negative for blurred vision, double vision and photophobia.  Respiratory: Positive for shortness of breath. Negative for cough, hemoptysis and wheezing.   Cardiovascular: Negative for chest pain, palpitations, orthopnea and leg swelling.  Gastrointestinal: Positive for abdominal pain and nausea. Negative for constipation, diarrhea, heartburn, melena and vomiting.  Genitourinary: Negative for dysuria, frequency and urgency.  Musculoskeletal: Negative  for back pain, myalgias and neck pain.  Skin: Negative for rash.  Neurological: Negative for dizziness, tremors, sensory change, speech change, focal weakness and headaches.  Endo/Heme/Allergies: Does not bruise/bleed easily.  Psychiatric/Behavioral: Negative for depression.    MEDICATIONS AT HOME:   Prior to Admission medications   Medication Sig Start Date End Date Taking? Authorizing Provider  albuterol (PROVENTIL HFA;VENTOLIN HFA) 108 (90 Base) MCG/ACT inhaler Inhale 2 puffs into the lungs every 6 (six) hours as needed for wheezing or shortness of  breath. 05/26/17  Yes Henreitta Leber, MD  aspirin EC 81 MG tablet Take 81 mg by mouth daily.   Yes [provider]  atorvastatin (LIPITOR) 40 MG tablet TAKE 1 TABLET (40 MG TOTAL) BY MOUTH DAILY. 04/10/17  Yes Kathrine Haddock, NP  budesonide-formoterol (SYMBICORT) 160-4.5 MCG/ACT inhaler Inhale 2 puffs into the lungs 2 (two) times daily. Patient taking differently: Inhale 2 puffs into the lungs daily.  06/15/15  Yes Kathrine Haddock, NP  buPROPion (WELLBUTRIN) 75 MG tablet Take 75 mg by mouth daily. 03/26/16 05/08/18 Yes [provider]  enalapril (VASOTEC) 5 MG tablet TAKE 1 TABLET (5 MG TOTAL) BY MOUTH DAILY. NEED F/U APPT FOR ADDITIONAL REFILLS 08/07/16  Yes Kathrine Haddock, NP  ipratropium-albuterol (DUONEB) 0.5-2.5 (3) MG/3ML SOLN Take 3 mLs by nebulization every 6 (six) hours as needed (wheezing; shortness of breath).   Yes [provider]  JANUVIA 100 MG tablet TAKE 1 TABLET (100 MG TOTAL) BY MOUTH DAILY. 04/10/17  Yes Kathrine Haddock, NP  metFORMIN (GLUCOPHAGE) 1000 MG tablet TAKE 1 TABLET (1,000 MG TOTAL) BY MOUTH 2 (TWO) TIMES DAILY WITH A MEAL. 09/12/16  Yes Kathrine Haddock, NP  metoprolol succinate (TOPROL-XL) 50 MG 24 hr tablet TAKE 1 TABLET (50 MG TOTAL) BY MOUTH DAILY. TAKE WITH OR IMMEDIATELY FOLLOWING A MEAL. 01/19/17  Yes Crissman, Jeannette How, MD  nitroGLYCERIN (NITROSTAT) 0.4 MG SL tablet Place 0.4 mg under the tongue every 5 (five) minutes as needed for chest pain.   Yes [provider]  pantoprazole (PROTONIX) 40 MG tablet Take 1 tablet (40 mg total) by mouth every morning. 06/29/17  Yes [name], MD  sucralfate (CARAFATE) 1 g tablet Take 1 g by mouth 4 (four) times daily -  with meals and at bedtime.  05/15/17 05/15/18 Yes [provider]  clopidogrel (PLAVIX) 75 MG tablet TAKE 1 TABLET (75 MG TOTAL) BY MOUTH DAILY. Patient not taking: Reported on 08/09/2017 12/08/16   Kathrine Haddock, NP  levETIRAcetam (KEPPRA) 500 MG tablet Take 500 mg by mouth 2 (two)  times daily.    [provider]  ONE TOUCH ULTRA TEST test strip USE TO TEST BLOOD SUGAR ONCE DAILY 10/07/16   Park Liter P, DO      VITAL SIGNS:  Blood pressure 125/81, pulse 91, temperature 98.1 F (36.7 C), temperature source Oral, resp. rate 16, SpO2 96 %.  PHYSICAL EXAMINATION:   Physical Exam  GENERAL:  62 y.o.-year-old patient lying in the bed with no acute distress.  EYES: Pupils equal, round, reactive to light and accommodation. No scleral icterus. Extraocular muscles intact.  HEENT: Head atraumatic, normocephalic. Oropharynx and nasopharynx clear.  NECK:  Supple, no jugular venous distention. No thyroid enlargement, no tenderness.  LUNGS: Scant breath sounds bilaterally, occasional scattered wheezing, no rhonchi or crackles.  Decreased breath sounds at the bases. No use of accessory muscles of respiration.  CARDIOVASCULAR: S1, S2 normal. No rubs, or gallops.  3/6 systolic murmur present.  Pacemaker and defibrillator  in place ABDOMEN: Soft, nontender, diffusely  distended. Bowel sounds present. No organomegaly or mass.  EXTREMITIES: No  cyanosis, or clubbing.  1+ bilateral pedal edema noted NEUROLOGIC: Cranial nerves II through XII are intact. Muscle strength 5/5 in all extremities. Sensation intact. Gait not checked.  PSYCHIATRIC: The patient is alert and oriented x 3.  SKIN: No obvious rash, lesion, or ulcer.   LABORATORY PANEL:   CBC Recent Labs  Lab 08/09/17 0626  WBC 7.9  HGB 10.9*  HCT 34.1*  PLT 277   ------------------------------------------------------------------------------------------------------------------  Chemistries  Recent Labs  Lab 08/09/17 0626  NA 137  K 4.5  CL 101  CO2 27  GLUCOSE 153*  BUN 14  CREATININE 0.86  CALCIUM 9.0  AST 30  ALT 18  ALKPHOS 92  BILITOT 0.8   ------------------------------------------------------------------------------------------------------------------  Cardiac Enzymes No results for  input(s): TROPONINI in the last 168 hours. ------------------------------------------------------------------------------------------------------------------  RADIOLOGY:  Dg Chest 2 View  Result Date: 08/09/2017 CLINICAL DATA:  Pleural effusion and ascites. EXAM: CHEST - 2 VIEW COMPARISON:  05/25/2017 FINDINGS: Left chest wall ICD is noted with leads in the right atrial appendage and right ventricle. No pleural effusion or edema identified. No airspace opacities. The visualized osseous structures are unremarkable. IMPRESSION: 1. No acute cardiopulmonary abnormalities. Electronically Signed   By: Kerby Moors M.D.   On: 08/09/2017 09:32   Ct Abdomen Pelvis W Contrast  Result Date: 08/09/2017 CLINICAL DATA:  ABDOMINAL PAIN. EXAM: CT ABDOMEN AND PELVIS WITH CONTRAST TECHNIQUE: Multidetector CT imaging of the abdomen and pelvis was performed using the standard protocol following bolus administration of intravenous contrast. CONTRAST:  189mL ISOVUE-300 IOPAMIDOL (ISOVUE-300) INJECTION 61% COMPARISON:  02/02/2017. FINDINGS: Lower chest: Small right pleural effusion. Hepatobiliary: Mild hepatic steatosis. Relative hypertrophy of the lateral segment of left lobe of liver. No focal liver abnormality. Previous cholecystectomy. No biliary dilatation. Pancreas: Unremarkable. No pancreatic ductal dilatation or surrounding inflammatory changes. Spleen: Unchanged subcapsular calcifications overlying the lateral aspect of the spleen. Adrenals/Urinary Tract: The adrenal glands appear normal. Unremarkable appearance of the kidneys. No mass or hydronephrosis. The urinary bladder appears normal. Stomach/Bowel: The stomach appears normal. No abnormal dilatation of the small or large bowel loops. The appendix is visualized and appears normal. Distal colonic diverticula identified scratch set a few distal colonic diverticula are noted. No acute inflammation. Vascular/Lymphatic: Aortic atherosclerosis. No aneurysm. The portal  vein and hepatic veins appear patent. Left-sided inferior vena cava identified. Peripancreatic node is unchanged measuring 1.4 cm. No pelvic or inguinal adenopathy. Reproductive: Prostate is unremarkable. Other: There is a moderate volume of ascites within the abdomen and pelvis which is new compared with previous exam. No focal fluid collections identified. Bilateral fact containing inguinal hernias identified. Ascites extends into the right inguinal canal. Musculoskeletal: Degenerative disc disease identified within the lumbar spine. No suspicious bone lesions. IMPRESSION: 1. Interval development of moderate volume of ascites within the abdomen and pelvis. Etiology indeterminate. Consider further evaluation with diagnostic paracentesis. 2. There is hypertrophy of the lateral segment of left lobe of liver. Nonspecific but may seen with early cirrhosis. 3. New right pleural effusion 4.  Aortic Atherosclerosis (ICD10-I70.0). 5. Bilateral fat containing inguinal hernias. Electronically Signed   By: Kerby Moors M.D.   On: 08/09/2017 07:52    EKG:   Orders placed or performed during the hospital encounter of 05/25/17  . ED EKG  . ED EKG    IMPRESSION AND PLAN:   John Frank  is a 62 y.o. male with a  known history of CAD, ischemic cardiomyopathy with EF of 25% status post defibrillator and pacemaker, COPD not on home oxygen, diabetes, hypertension presents to hospital secondary to worsening abdominal pain and shortness of breath.  1. Acute on chronic systolic CHF exacerbation- known EF 25% -Not in any diuretics at home. -Admit to telemetry, started on IV Lasix -Daily weights, low-salt diet.  Fluid restriction -On Toprol and enalapril.  Consider entresto as outpatient -On 2 L supplemental oxygen.  Wean as tolerated  2.  Abdominal pain, now new ascites-could be CHF.  LFTs and albumin are within normal limits.  Check INR -Consult GI. -Diagnostic tap ordered.  Empirically started on Rocephin.  3.   Diabetes mellitus-hold metformin due to CT, on linagliptin and sliding scale insulin.  4.  GERD-sucralfate and Protonix  5.  COPD-no acute exacerbation.  Continue nebs and inhalers.  Continue supplemental oxygen  6. DVT Prophylaxis- SQ heparin   All the records are reviewed and case discussed with ED provider. Management plans discussed with the patient, family and they are in agreement.  CODE STATUS: Full code  TOTAL TIME TAKING CARE OF THIS PATIENT: 50 minutes.    Gladstone Lighter M.D on 08/09/2017 at 12:57 PM  Between 7am to 6pm - Pager - [phone]  After 6pm go to [email or web address] - password [email or web address] [facility]  Office  [phone]  CC: Primary care physician; Idelle Crouch, MD

## 2017-08-09 NOTE — ED Notes (Signed)
Pt reports 2 weeks ago developed a sharp pain in abdomen reports scheduled endoscopy on 4/9 reports feels nausea but no episodes of vomiting reports able to eat, passes gas and has normal BM, also reports 2 years ago he was told he has a hiatal hernia. Pt reports abdominal pain at all times, pt talks in complete sentences no distress noted

## 2017-08-09 NOTE — ED Notes (Signed)
Dr. Tressia Miners at bedside.

## 2017-08-09 NOTE — ED Notes (Signed)
Pt able to void about 50cc of dark yellow urine, no odor noted.

## 2017-08-10 ENCOUNTER — Inpatient Hospital Stay: Payer: Medicare Other

## 2017-08-10 LAB — BASIC METABOLIC PANEL
ANION GAP: 9 (ref 5–15)
BUN: 14 mg/dL (ref 6–20)
CO2: 28 mmol/L (ref 22–32)
Calcium: 8.6 mg/dL — ABNORMAL LOW (ref 8.9–10.3)
Chloride: 100 mmol/L — ABNORMAL LOW (ref 101–111)
Creatinine, Ser: 0.94 mg/dL (ref 0.61–1.24)
GFR calc Af Amer: >60 mL/min (ref >60)
Glucose, Bld: 139 mg/dL — ABNORMAL HIGH (ref 65–99)
POTASSIUM: 4.3 mmol/L (ref 3.5–5.1)
SODIUM: 137 mmol/L (ref 135–145)

## 2017-08-10 LAB — AMYLASE, PLEURAL OR PERITONEAL FLUID: AMYLASE FL: 33 U/L

## 2017-08-10 LAB — ALBUMIN, PLEURAL OR PERITONEAL FLUID: Albumin, Fluid: 2.1 g/dL

## 2017-08-10 LAB — GLUCOSE, CAPILLARY
GLUCOSE-CAPILLARY: 123 mg/dL — AB (ref 65–99)
GLUCOSE-CAPILLARY: 114 mg/dL — AB (ref 65–99)
Glucose-Capillary: 176 mg/dL — ABNORMAL HIGH (ref 65–99)
Glucose-Capillary: 114 mg/dL — ABNORMAL HIGH (ref 65–99)

## 2017-08-10 LAB — PROTEIN, PLEURAL OR PERITONEAL FLUID: TOTAL PROTEIN, FLUID: 3.2 g/dL

## 2017-08-10 LAB — CBC
HEMATOCRIT: 34.4 % — AB (ref 40.0–52.0)
HEMOGLOBIN: 10.7 g/dL — AB (ref 13.0–18.0)
MCH: 25.9 pg — ABNORMAL LOW (ref 26.0–34.0)
MCHC: 31.1 g/dL — ABNORMAL LOW (ref 32.0–36.0)
MCV: 83.4 fL (ref 80.0–100.0)
Platelets: 256 10*3/uL (ref 150–440)
RBC: 4.13 MIL/uL — ABNORMAL LOW (ref 4.40–5.90)
RDW: 19.6 % — ABNORMAL HIGH (ref 11.5–14.5)
WBC: 6.9 10*3/uL (ref 3.8–10.6)

## 2017-08-10 LAB — BODY FLUID CELL COUNT WITH DIFFERENTIAL
Lymphs, Fluid: 41 %
Monocyte-Macrophage-Serous Fluid: 43 %
Neutrophil Count, Fluid: 16 %
Total Nucleated Cell Count, Fluid: 664 cu mm

## 2017-08-10 LAB — GLUCOSE, PLEURAL OR PERITONEAL FLUID: Glucose, Fluid: 162 mg/dL

## 2017-08-10 LAB — PATHOLOGIST SMEAR REVIEW

## 2017-08-10 LAB — TROPONIN I: Troponin I: <0.03 ng/mL (ref <0.03)

## 2017-08-10 LAB — LACTATE DEHYDROGENASE, PLEURAL OR PERITONEAL FLUID: LD FL: 76 U/L — AB (ref 3–23)

## 2017-08-10 MED ORDER — SPIRONOLACTONE 25 MG PO TABS
12.5000 mg | ORAL_TABLET | Freq: Every day | ORAL | Status: DC
  Administered 2017-08-10 – 2017-08-11 (×2): 12.5 mg via ORAL
  Filled 2017-08-10: qty 0.5
  Filled 2017-08-10: qty 1
  Filled 2017-08-10: qty 0.5
  Filled 2017-08-10: qty 1

## 2017-08-10 NOTE — Procedures (Signed)
US guided paracentesis.  800 ml of yellow fluid removed from right abdomen.  Minimal blood loss and no immediate complication.

## 2017-08-10 NOTE — Progress Notes (Signed)
PT Cancellation Note  Patient Details Name: John Frank MRN: [id number] DOB: 09/02/1955   Cancelled Treatment:    Reason Eval/Treat Not Completed: Patient at procedure or test/unavailable, will attempt to see pt at a future date/time as medically appropriate.   [name] PT, DPT 08/10/17, 10:06 AM

## 2017-08-10 NOTE — Progress Notes (Signed)
Patient ID: John Frank, male   DOB: [birth year], 62 y.o.   MRN: [id number]  [facility] PROGRESS NOTE  John Frank ZJI:[id number] DOB: August 14, 1955 DOA: 08/09/2017 PCP: John Crouch, MD  HPI/Subjective: Patient feeling a little bit better when I saw him.  Little bit short of breath.  Some abdominal distention.  Abdominal pain better than yesterday.  Objective: Vitals:   08/10/17 1130 08/10/17 1320  BP: 106/75   Pulse: 90   Resp: 16   Temp:    SpO2: 98% 97%    Filed Weights   08/09/17 1923 08/10/17 0326  Weight: 75.7 kg (166 lb 14.4 oz) 75.5 kg (166 lb 6.4 oz)    ROS: Review of Systems  Constitutional: Negative for chills and fever.  Eyes: Negative for blurred vision.  Respiratory: Positive for shortness of breath. Negative for cough.   Cardiovascular: Negative for chest pain.  Gastrointestinal: Positive for abdominal pain. Negative for constipation, diarrhea, nausea and vomiting.  Genitourinary: Negative for dysuria.  Musculoskeletal: Negative for joint pain.  Neurological: Negative for dizziness and headaches.   Exam: Physical Exam  Constitutional: He is oriented to person, place, and time.  HENT:  Nose: No mucosal edema.  Mouth/Throat: No oropharyngeal exudate or posterior oropharyngeal edema.  Eyes: Pupils are equal, round, and reactive to light. Conjunctivae, EOM and lids are normal.  Neck: No JVD present. Carotid bruit is not present. No edema present. No thyroid mass and no thyromegaly present.  Cardiovascular: S1 normal and S2 normal. Exam reveals no gallop.  No murmur heard. Pulses:      Dorsalis pedis pulses are 2+ on the right side, and 2+ on the left side.  Respiratory: No respiratory distress. He has decreased breath sounds in the right middle field and the right lower field. He has no wheezes. He has rhonchi in the right lower field. He has no rales.  GI: Soft. Bowel sounds are normal. He exhibits distension. There is no tenderness.   Musculoskeletal:       Right shoulder: He exhibits no swelling.  Lymphadenopathy:    He has no cervical adenopathy.  Neurological: He is alert and oriented to person, place, and time. No cranial nerve deficit.  Skin: Skin is warm. No rash noted. Nails show no clubbing.  Psychiatric: He has a normal mood and affect.      Data Reviewed: Basic Metabolic Panel: Recent Labs  Lab 08/03/17 1442 08/09/17 0626 08/10/17 0316  NA 136 137 137  K 4.4 4.5 4.3  CL 99* 101 100*  CO2 26 27 28   GLUCOSE 121* 153* 139*  BUN 11 14 14   CREATININE 0.87 0.86 0.94  CALCIUM 9.0 9.0 8.6*   Liver Function Tests: Recent Labs  Lab 08/03/17 1442 08/09/17 0626  AST 28 30  ALT 15* 18  ALKPHOS 88 92  BILITOT 1.2 0.8  PROT 7.3 7.4  ALBUMIN 4.0 3.7   Recent Labs  Lab 08/09/17 0626  LIPASE 47   CBC: Recent Labs  Lab 08/09/17 0626 08/10/17 0316  WBC 7.9 6.9  NEUTROABS 6.1  --   HGB 10.9* 10.7*  HCT 34.1* 34.4*  MCV 82.5 83.4  PLT 277 256   Cardiac Enzymes: Recent Labs  Lab 08/09/17 1452 08/09/17 2028 08/10/17 0316  TROPONINI <0.03 <0.03 <0.03   BNP (last 3 results) Recent Labs    08/09/17 0626  BNP 756.0*     CBG: Recent Labs  Lab 08/09/17 1613 08/09/17 2114 08/10/17 0735 08/10/17 1144  GLUCAP 160*  104* 176* 123*    Studies: Dg Chest 2 View  Result Date: 08/09/2017 CLINICAL DATA:  Pleural effusion and ascites. EXAM: CHEST - 2 VIEW COMPARISON:  05/25/2017 FINDINGS: Left chest wall ICD is noted with leads in the right atrial appendage and right ventricle. No pleural effusion or edema identified. No airspace opacities. The visualized osseous structures are unremarkable. IMPRESSION: 1. No acute cardiopulmonary abnormalities. Electronically Signed   By: Kerby Moors M.D.   On: 08/09/2017 09:32   Ct Abdomen Pelvis W Contrast  Result Date: 08/09/2017 CLINICAL DATA:  ABDOMINAL PAIN. EXAM: CT ABDOMEN AND PELVIS WITH CONTRAST TECHNIQUE: Multidetector CT imaging of the  abdomen and pelvis was performed using the standard protocol following bolus administration of intravenous contrast. CONTRAST:  122mL ISOVUE-300 IOPAMIDOL (ISOVUE-300) INJECTION 61% COMPARISON:  02/02/2017. FINDINGS: Lower chest: Small right pleural effusion. Hepatobiliary: Mild hepatic steatosis. Relative hypertrophy of the lateral segment of left lobe of liver. No focal liver abnormality. Previous cholecystectomy. No biliary dilatation. Pancreas: Unremarkable. No pancreatic ductal dilatation or surrounding inflammatory changes. Spleen: Unchanged subcapsular calcifications overlying the lateral aspect of the spleen. Adrenals/Urinary Tract: The adrenal glands appear normal. Unremarkable appearance of the kidneys. No mass or hydronephrosis. The urinary bladder appears normal. Stomach/Bowel: The stomach appears normal. No abnormal dilatation of the small or large bowel loops. The appendix is visualized and appears normal. Distal colonic diverticula identified scratch set a few distal colonic diverticula are noted. No acute inflammation. Vascular/Lymphatic: Aortic atherosclerosis. No aneurysm. The portal vein and hepatic veins appear patent. Left-sided inferior vena cava identified. Peripancreatic node is unchanged measuring 1.4 cm. No pelvic or inguinal adenopathy. Reproductive: Prostate is unremarkable. Other: There is a moderate volume of ascites within the abdomen and pelvis which is new compared with previous exam. No focal fluid collections identified. Bilateral fact containing inguinal hernias identified. Ascites extends into the right inguinal canal. Musculoskeletal: Degenerative disc disease identified within the lumbar spine. No suspicious bone lesions. IMPRESSION: 1. Interval development of moderate volume of ascites within the abdomen and pelvis. Etiology indeterminate. Consider further evaluation with diagnostic paracentesis. 2. There is hypertrophy of the lateral segment of left lobe of liver. Nonspecific  but may seen with early cirrhosis. 3. New right pleural effusion 4.  Aortic Atherosclerosis (ICD10-I70.0). 5. Bilateral fat containing inguinal hernias. Electronically Signed   By: Kerby Moors M.D.   On: 08/09/2017 07:52   US Paracentesis  Result Date: 08/10/2017 INDICATION: 62 year old with abdominal ascites. Request for a diagnostic paracentesis. EXAM: ULTRASOUND GUIDED DIAGNOSTIC PARACENTESIS MEDICATIONS: None. COMPLICATIONS: None immediate. PROCEDURE: Informed written consent was obtained from the patient after a discussion of the risks, benefits and alternatives to treatment. A timeout was performed prior to the initiation of the procedure. Initial ultrasound scanning demonstrates a moderate amount of ascites within the right abdomen, adjacent to the liver. The right abdomen was prepped and draped in the usual sterile fashion. 1% lidocaine was used for local anesthesia. Following this, a 6 Fr Safe-T-Centesis catheter was introduced with real-time ultrasound guidance. An ultrasound image was saved for documentation purposes. The paracentesis was performed. The catheter was removed and a dressing was applied. The patient tolerated the procedure well without immediate post procedural complication. FINDINGS: A total of approximately 800 mL of yellow fluid was removed. Samples were sent to the laboratory as requested by the clinical team. IMPRESSION: Successful ultrasound-guided paracentesis yielding 800 mL of peritoneal fluid. Electronically Signed   By: Markus Daft M.D.   On: 08/10/2017 12:03    Scheduled Meds: .  aspirin EC  81 mg Oral Daily  . buPROPion  75 mg Oral Daily  . enalapril  5 mg Oral Daily  . furosemide  40 mg Intravenous Q12H  . heparin  5,000 Units Subcutaneous Q8H  . insulin aspart  0-5 Units Subcutaneous QHS  . insulin aspart  0-9 Units Subcutaneous TID WC  . ipratropium-albuterol  3 mL Nebulization Q6H  . levETIRAcetam  500 mg Oral BID  . linagliptin  5 mg Oral Daily  .  metoprolol succinate  50 mg Oral Daily  . mometasone-formoterol  2 puff Inhalation BID  . pantoprazole  40 mg Oral [id number]  . sodium chloride flush  3 mL Intravenous Q12H  . spironolactone  12.5 mg Oral Daily  . sucralfate  1 g Oral TID WC & HS   Continuous Infusions: . cefTRIAXone (ROCEPHIN)  IV 2 g (08/10/17 1207)    Assessment/Plan:  1. Severe abdominal pain and new ascites.  Treated empirically for SBP with Rocephin.  White blood cells in fluids 664.  Awaiting culture.  Unclear why the patient has ascites at this point. 2. Acute on chronic systolic congestive heart failure with EF around 30%.  Patient also has right pleural effusion.  Patient on Toprol and enalapril.  Continue IV Lasix while here.  Check chest x-ray tomorrow to see if effusion has improved. Add low-dose Aldactone. 3. Type 2 diabetes mellitus.  Hold metformin.  On linagliptin. 4. GERD on Carafate and Protonix 5. COPD on inhalers  Code Status:     Code Status Orders  (From admission, onward)        Start     Ordered   08/09/17 1444  Full code  Continuous     08/09/17 1443    Code Status History    Date Active Date Inactive Code Status Order ID Comments User Context   05/26/2017 0211 05/26/2017 1627 Full Code [id number]  Lance Coon, MD Inpatient   12/24/2014 0728 12/25/2014 1359 Full Code [id number]  Hillary Bow, MD ED     Family Communication: Family at bedside Disposition Plan: To be determined  Consultants:  Cardiology  Gastroenterology  Antibiotics:  Empiric Rocephin  Time spent: 28 minutes  [place]

## 2017-08-10 NOTE — Plan of Care (Signed)
No voiced complaints of abdominal pain.

## 2017-08-10 NOTE — Progress Notes (Addendum)
62 year old male who presented to the ED with severe abdominal pain - new ascites, acute on chronic systolic CHF with EF @ 16% (04/13/2017), right pleural effusion.  Patient has hx of DM Type 2, GERD, and COPD.  S/P paracentesis today where 800 ml fluid removed.    Rounded on patient.  Patient's significant other at bedside.    CHF Education:?? Educational session with patient and significant other completed.?  Provided patient with "Living Better with Heart Failure" packet. Briefly reviewed definition of heart failure and signs and symptoms of an exacerbation.?Discussed the meaning of EF and his value as compared to normal value. Explained to patient that HF is a chronic illness which requires self-assessment / self-management along with help from the cardiologist/PCP/HF Clinic.???  *Reviewed importance of and reason behind checking weight daily in the AM, after using the bathroom, but before getting dressed.?Patient has access to working scales.   Reviewed the following information with patient:  *Discussed when to call the Dr= weight gain of >2-3lb overnight of 5lb in a week,  *Discussed yellow zone= call MD: weight gain of >2-3lb overnight of 5lb in a week, increased swelling, increased SOB when lying down, chest discomfort, dizziness, increased fatigue *Red Zone= call 911: struggle to breath, fainting or near fainting, significant chest pain   *Reviewed low sodium diet-provided handout of recommended and not recommended foods. ?Reviewed reading labels with patient. Discussed fluid intake with patient as well. Patient not currently on a fluid restriction, but advised no more than 8-8 ounces glass of fluids per day.?Note:  Dietitian Consultation for diet education pending.     *Instructed patient to take medications as prescribed for heart failure. Explained briefly why pt is on the medications (either make you feel better, live longer or keep you out of the hospital) and discussed monitoring and  side effects.   *Smoking Cessation - Patient is a current every day smoker.  Patient reports smoking 0.5 ppd currently. Patient informed this RN that he has never tried to quit smoking before.  Talked with patient about tapering down and switching brands in preparation to quit.  "Thinking about Quitting - Yes You Can!" informational sheet reviewed with patient.    ? *Discussed the benefits of exercise.?Patient informed this RN that his exercise currently is cleaning house.  Patient encouraged to remain as active as possible. ?Per CM RN Note:  Patient has agreed to have [facility] RN visit post discharge.  Patient's EF is 30% and therefore, per Medicare guidelines he is a candidate for cardiac rehab.  Will discuss this with patient tomorrow.    *[facility]- Explained the role of the [facility]. ?Explained to patient and significant other that the HF Clinic does not replace his PCP/cardiologist, but is an additional resource to help him manage his HF and to keep him out of the hospital. Appointment in the [facility] is scheduled for 08/17/2017 at 1:20 p.m.  Patient encouraged to keep this appointment.   Again, the 5 Steps to Living Better with Heart Failure were reviewed with patient and significant other. ?Patient and significant other thanked me for providing the above information. ?  Roanna Epley, RN, BSN, [facility] Cardiovascular and Pulmonary Nurse Navigator

## 2017-08-10 NOTE — Care Management (Signed)
Patient presents from home.  He lives with his significant other. Each say they take care of the other due to "confusion."  Patient's current need for oxygen is acute. He drives himself to his pcp- Dr Doy Hutching.  he denies that he has any trouble paying for his medications.  he would be agreeable to have home health RN .  Would also anticipate need for sw to determine if there are any resources needed by patient and his significant other. has access to a walker "but I don't need it."  Dietician referral.  has access to working scales "and I can do my weight every day."

## 2017-08-10 NOTE — Evaluation (Signed)
Physical Therapy Evaluation Patient Details Name: John Frank MRN: [id number] DOB: 1955-11-12 Today's Date: 08/10/2017   History of Present Illness  Pt is a 62 y.o. male with a known history of CAD, ischemic cardiomyopathy with EF of 25% status post defibrillator and pacemaker, COPD not on home oxygen, diabetes, hypertension presents to hospital secondary to worsening abdominal pain and shortness of breath.  Patient has been having abdominal pain for a few months now.  He has seen a GI as outpatient and was in the process of getting EGD and colonoscopy done.  His CT of the abdomen done few months ago was normal at the time.  His abdomen over the last week does appear to be more distended according to his wife, and he has been having intermittent episodes of mid abdominal pain which was worse last night so came to the emergency room.  He also noticed that he has 1-2+ ankle edema and some shortness of breath.  He was hypoxic here with sats 86% on room air and so placed on oxygen.  CT of the abdomen showing moderate ascites.  Denies any fevers or chills.  Mild nausea but no vomiting or diarrhea.  Denies any chest pain, cold cough or congestion.  Also has right pleural effusion.  Assessment includes: Abdominal pain and ascites, acute on chronic systolic CHF with EF around 30%, DM II, GERD, and COPD.    Clinical Impression  Pt presents with no deficits in strength, transfers, mobility, gait, or balance, and with min deficits in activity tolerance.  Pt Ind with all bed mobility tasks and transfers demonstrating good eccentric and concentric control during transfers with good stability.  Pt able to amb both with a RW and without an AD with very good stability and presented with bilateral SLS times of > 10 sec each without LOB.  Pt's SpO2 on 2LO2/min was 97% at baseline with a HR of 92 bpm.  After amb pt's SpO2 remained at 97% with HR increasing to 105 bpm.  Pt reported distance ambulated as "easy".  Will  complete PT orders at this time as pt does not require skilled PT services.  Will reassess pt pending a change in status upon receipt of new PT orders.       Follow Up Recommendations No PT follow up    Equipment Recommendations  None recommended by PT    Recommendations for Other Services       Precautions / Restrictions Precautions Precautions: Fall Restrictions Weight Bearing Restrictions: No      Mobility  Bed Mobility Overal bed mobility: Independent             General bed mobility comments: Good speed and effort with all bed mobility tasks assessed without use of bed rail  Transfers Overall transfer level: Independent Equipment used: None             General transfer comment: Good eccentric and concentric control during transfers with good stability   Ambulation/Gait Ambulation/Gait assistance: Independent Ambulation Distance (Feet): 100 Feet Assistive device: None;Rolling walker (2 wheeled) Gait Pattern/deviations: WFL(Within Functional Limits)   Gait velocity interpretation: Below normal speed for age/gender General Gait Details: Minimally reduced gait speed for age/gender but steady both with and without a RW  Stairs            Wheelchair Mobility    Modified Rankin (Stroke Patients Only)       Balance Overall balance assessment: Independent(Pt steady during functional tasks without UE support and able  to perform SLS on the R and L LEs for > 10 sec each without LOB)                                           Pertinent Vitals/Pain Pain Assessment: No/denies pain    Home Living Family/patient expects to be discharged to:: Private residence Living Arrangements: Other (Comment);Spouse/significant other(live-in girlfriend) Available Help at Discharge: Friend(s);Available 24 hours/day Type of Home: Apartment Home Access: Level entry     Home Layout: One level Home Equipment: Cane - single point;Walker - 4 wheels       Prior Function Level of Independence: Independent         Comments: Pt Ind with amb without AD limited community distances with no fall history, Ind with all ADLs     Hand Dominance   Dominant Hand: Right    Extremity/Trunk Assessment   Upper Extremity Assessment Upper Extremity Assessment: Overall WFL for tasks assessed    Lower Extremity Assessment Lower Extremity Assessment: Overall WFL for tasks assessed       Communication   Communication: No difficulties  Cognition Arousal/Alertness: Awake/alert Behavior During Therapy: WFL for tasks assessed/performed Overall Cognitive Status: Within Functional Limits for tasks assessed                                        General Comments      Exercises     Assessment/Plan    PT Assessment Patent does not need any further PT services  PT Problem List         PT Treatment Interventions      PT Goals (Current goals can be found in the Care Plan section)  Acute Rehab PT Goals PT Goal Formulation: All assessment and education complete, DC therapy    Frequency     Barriers to discharge        Co-evaluation               AM-PAC PT "6 Clicks" Daily Activity  Outcome Measure Difficulty turning over in bed (including adjusting bedclothes, sheets and blankets)?: None Difficulty moving from lying on back to sitting on the side of the bed? : None Difficulty sitting down on and standing up from a chair with arms (e.g., wheelchair, bedside commode, etc,.)?: None Help needed moving to and from a bed to chair (including a wheelchair)?: None Help needed walking in hospital room?: None Help needed climbing 3-5 steps with a railing? : None 6 Click Score: 24    End of Session Equipment Utilized During Treatment: Gait belt;Oxygen Activity Tolerance: Patient tolerated treatment well Patient left: in chair;with call bell/phone within reach;with chair alarm set Nurse Communication: Mobility  status PT Visit Diagnosis: Muscle weakness (generalized) (M62.81)    Time: 1410-1441 PT Time Calculation (min) (ACUTE ONLY): 31 min   Charges:   PT Evaluation $PT Eval Low Complexity: 1 Low     PT G Codes:        DRoyetta Asal PT, DPT 08/10/17, 4:27 PM

## 2017-08-11 ENCOUNTER — Telehealth: Payer: Self-pay | Admitting: Gastroenterology

## 2017-08-11 ENCOUNTER — Inpatient Hospital Stay: Payer: Medicare Other

## 2017-08-11 LAB — TRIGLYCERIDES, BODY FLUIDS: Triglycerides, Fluid: 31 mg/dL

## 2017-08-11 LAB — BASIC METABOLIC PANEL
Anion gap: 8 (ref 5–15)
BUN: 17 mg/dL (ref 6–20)
CHLORIDE: 98 mmol/L — AB (ref 101–111)
CO2: 33 mmol/L — AB (ref 22–32)
Calcium: 8.5 mg/dL — ABNORMAL LOW (ref 8.9–10.3)
Creatinine, Ser: 0.96 mg/dL (ref 0.61–1.24)
GFR calc Af Amer: >60 mL/min (ref >60)
GFR calc non Af Amer: >60 mL/min (ref >60)
GLUCOSE: 124 mg/dL — AB (ref 65–99)
POTASSIUM: 4.2 mmol/L (ref 3.5–5.1)
Sodium: 139 mmol/L (ref 135–145)

## 2017-08-11 LAB — GLUCOSE, CAPILLARY: Glucose-Capillary: 218 mg/dL — ABNORMAL HIGH (ref 65–99)

## 2017-08-11 MED ORDER — SPIRONOLACTONE 25 MG PO TABS: 25.0000 mg | ORAL_TABLET | Freq: Every day | ORAL | Status: DC

## 2017-08-11 MED ORDER — CIPROFLOXACIN HCL 500 MG PO TABS: 500.0000 mg | ORAL_TABLET | Freq: Two times a day (BID) | ORAL | 0 refills | Status: DC

## 2017-08-11 MED ORDER — SPIRONOLACTONE 25 MG PO TABS: 25.0000 mg | ORAL_TABLET | Freq: Every day | ORAL | 0 refills | Status: DC

## 2017-08-11 MED ORDER — SPIRONOLACTONE 25 MG PO TABS
12.5000 mg | ORAL_TABLET | Freq: Once | ORAL | Status: AC
  Administered 2017-08-11: 12.5 mg via ORAL
  Filled 2017-08-11: qty 0.5

## 2017-08-11 MED ORDER — FUROSEMIDE 40 MG PO TABS: 40.0000 mg | ORAL_TABLET | Freq: Every day | ORAL | 0 refills | Status: DC

## 2017-08-11 NOTE — Progress Notes (Signed)
IVs and tele removed from patient. Discharge instructions given to patient. Verbalized understanding. No distress at this time. Patient to call when ready for wheelchair.

## 2017-08-11 NOTE — Progress Notes (Signed)
Nutrition Education Note  RD consulted for nutrition education regarding new onset CHF.  62 y/o male with h/o COPD, GERD, admitted with new CHF  RD provided "Low Sodium Nutrition Therapy" handout from the Academy of [employer]. Reviewed patient's dietary recall. Provided examples on ways to decrease sodium intake in diet. Discouraged intake of processed foods and use of salt shaker. Encouraged fresh fruits and vegetables as well as whole grain sources of carbohydrates to maximize fiber intake.   RD discussed why it is important for patient to adhere to diet recommendations, and emphasized the role of fluids, foods to avoid, and importance of weighing self daily. Teach back method used.  Expect fair compliance.  Body mass index is 27.91 kg/m. Pt meets criteria for overweight based on current BMI. Pt with weight gain secondary to ascites but previously weight stable.   Current diet order is HH/CHO, patient is consuming approximately 100% of meals at this time. Labs and medications reviewed. No further nutrition interventions warranted at this time. RD contact information provided. If additional nutrition issues arise, please re-consult RD.   Koleen Distance MS, RD, LDN Pager #- [phone] After Hours Pager: [phone]

## 2017-08-11 NOTE — Telephone Encounter (Signed)
[facility] 2 week ED fu I left vm with pt to inform him of his scheduld apt

## 2017-08-11 NOTE — Discharge Summary (Signed)
[place] [facility] [place] NAME: John Frank    MR#:  [id number]  DATE OF BIRTH:  January 30, 1956  DATE OF ADMISSION:  08/09/2017 ADMITTING PHYSICIAN: Gladstone Lighter, MD  DATE OF DISCHARGE: 08/11/2017  1:08 PM  PRIMARY CARE PHYSICIAN: Idelle Crouch, MD    ADMISSION DIAGNOSIS:  Intermittent abdominal pain [R10.9] Hypoxia [R09.02] Other ascites [R18.8] Ascites [R18.8] Chronic obstructive pulmonary disease, unspecified COPD type ([place]) [R67.8] Systolic congestive heart failure, unspecified HF chronicity ([place]) [I50.20]  DISCHARGE DIAGNOSIS:  Active Problems:   CHF exacerbation ([place])   SECONDARY DIAGNOSIS:   Past Medical History:  Diagnosis Date  . CAD (coronary artery disease)    s/p stents in 2017  . CHF (congestive heart failure) ([place])   . COPD (chronic obstructive pulmonary disease) ([place])   . Diabetes ([place])   . GERD (gastroesophageal reflux disease)   . Heart attack ([place])   . Hyperlipidemia   . Hypertension   . Presence of permanent cardiac pacemaker   . Shortness of breath dyspnea   . Sleep apnea     HOSPITAL COURSE:   1.  Acute on chronic systolic congestive heart failure.  EF as outpatient 30%.  Patient also had a right pleural effusion and ascites.  Patient on Toprol and enalapril as outpatient.  The patient was diuresed with IV Lasix.  Aldactone was added to his regimen.  He will be set up with the CHF clinic as outpatient. 2.  Acute hypoxic respiratory failure as per ER physician's note with a pulse ox of 85% on room air.  This has improved and saturating well on room air at this point 3.  Right pleural effusion.  Repeat chest x-ray on the day of discharge shows this has improved.  Hold off on thoracentesis at this point. 4.  Abdominal pain.  Treat empirically for SBP with white blood cell count high on paracentesis.  The patient was given antibiotics prior to the paracentesis.  Patient was given Rocephin here and will finish up  a course with Cipro.  Follow-up with gastroenterology as outpatient. 5.  Ascites.  800 mL of of yellow fluid removed from the right abdomen.  Case discussed with gastroenterology, SAAG is high which can be consistent with congestive heart failure. 6.  Type 2 diabetes mellitus.  Can restart metformin as outpatient.  On linagliptin 7.  GERD on Carafate and Protonix 8.  COPD on inhalers 9.  Hyperlipidemia unspecified on atorvastatin  DISCHARGE CONDITIONS:   Satisfactory  CONSULTS OBTAINED:  Treatment Team:  [name], MD Lucilla Lame, MD Yolonda Kida, MD  DRUG ALLERGIES:  No Known Allergies  DISCHARGE MEDICATIONS:   Allergies as of 08/11/2017   No Known Allergies     Medication List    TAKE these medications   albuterol 108 (90 Base) MCG/ACT inhaler Commonly known as:  PROVENTIL HFA;VENTOLIN HFA Inhale 2 puffs into the lungs every 6 (six) hours as needed for wheezing or shortness of breath.   aspirin EC 81 MG tablet Take 81 mg by mouth daily.   atorvastatin 40 MG tablet Commonly known as:  LIPITOR TAKE 1 TABLET (40 MG TOTAL) BY MOUTH DAILY.   budesonide-formoterol 160-4.5 MCG/ACT inhaler Commonly known as:  SYMBICORT Inhale 2 puffs into the lungs 2 (two) times daily. What changed:  when to take this   buPROPion 75 MG tablet Commonly known as:  WELLBUTRIN Take 75 mg by mouth daily.   ciprofloxacin 500 MG tablet Commonly known as:  CIPRO Take 1 tablet (500 mg total) by mouth 2 (two) times daily. Start taking on:  08/12/2017   clopidogrel 75 MG tablet Commonly known as:  PLAVIX TAKE 1 TABLET (75 MG TOTAL) BY MOUTH DAILY.   enalapril 5 MG tablet Commonly known as:  VASOTEC TAKE 1 TABLET (5 MG TOTAL) BY MOUTH DAILY. NEED F/U APPT FOR ADDITIONAL REFILLS   furosemide 40 MG tablet Commonly known as:  LASIX Take 1 tablet (40 mg total) by mouth daily.   ipratropium-albuterol 0.5-2.5 (3) MG/3ML Soln Commonly known as:  DUONEB Take 3 mLs by nebulization every  6 (six) hours as needed (wheezing; shortness of breath).   JANUVIA 100 MG tablet Generic drug:  sitaGLIPtin TAKE 1 TABLET (100 MG TOTAL) BY MOUTH DAILY.   levETIRAcetam 500 MG tablet Commonly known as:  KEPPRA Take 500 mg by mouth 2 (two) times daily.   metFORMIN 1000 MG tablet Commonly known as:  GLUCOPHAGE TAKE 1 TABLET (1,000 MG TOTAL) BY MOUTH 2 (TWO) TIMES DAILY WITH A MEAL.   metoprolol succinate 50 MG 24 hr tablet Commonly known as:  TOPROL-XL TAKE 1 TABLET (50 MG TOTAL) BY MOUTH DAILY. TAKE WITH OR IMMEDIATELY FOLLOWING A MEAL.   nitroGLYCERIN 0.4 MG SL tablet Commonly known as:  NITROSTAT Place 0.4 mg under the tongue every 5 (five) minutes as needed for chest pain.   ONE TOUCH ULTRA TEST test strip Generic drug:  glucose blood USE TO TEST BLOOD SUGAR ONCE DAILY   pantoprazole 40 MG tablet Commonly known as:  PROTONIX Take 1 tablet (40 mg total) by mouth every morning.   spironolactone 25 MG tablet Commonly known as:  ALDACTONE Take 1 tablet (25 mg total) by mouth daily. Start taking on:  08/12/2017   sucralfate 1 g tablet Commonly known as:  CARAFATE Take 1 g by mouth 4 (four) times daily -  with meals and at bedtime.        DISCHARGE INSTRUCTIONS:   Follow-up PMD 6 days Follow-up cardiology 1 week Follow-up CHF clinic Follow-up gastroenterology 2 weeks  If you experience worsening of your admission symptoms, develop shortness of breath, life threatening emergency, suicidal or homicidal thoughts you must seek medical attention immediately by calling 911 or calling your MD immediately  if symptoms less severe.  You Must read complete instructions/literature along with all the possible adverse reactions/side effects for all the Medicines you take and that have been prescribed to you. Take any new Medicines after you have completely understood and accept all the possible adverse reactions/side effects.   Please note  You were cared for by a hospitalist  during your hospital stay. If you have any questions about your discharge medications or the care you received while you were in the hospital after you are discharged, you can call the unit and asked to speak with the hospitalist on call if the hospitalist that took care of you is not available. Once you are discharged, your primary care physician will handle any further medical issues. Please note that NO REFILLS for any discharge medications will be authorized once you are discharged, as it is imperative that you return to your primary care physician (or establish a relationship with a primary care physician if you do not have one) for your aftercare needs so that they can reassess your need for medications and monitor your lab values.    Today   CHIEF COMPLAINT:   Chief Complaint  Patient presents with  . Abdominal Pain  HISTORY OF PRESENT ILLNESS:  John Frank  is a 62 y.o. male presented with abdominal pain   VITAL SIGNS:  Blood pressure 111/69, pulse 89, temperature 97.9 F (36.6 C), temperature source Oral, resp. rate 14, weight 73.8 kg (162 lb 9.6 oz), SpO2 93 %.   PHYSICAL EXAMINATION:  GENERAL:  62 y.o.-year-old patient lying in the bed with no acute distress.  EYES: Pupils equal, round, reactive to light and accommodation. No scleral icterus. Extraocular muscles intact.  HEENT: Head atraumatic, normocephalic. Oropharynx and nasopharynx clear.  NECK:  Supple, no jugular venous distention. No thyroid enlargement, no tenderness.  LUNGS:  decreased breath sounds bilateral bases, no wheezing, rales,rhonchi or crepitation. No use of accessory muscles of respiration.  CARDIOVASCULAR: S1, S2 normal. No murmurs, rubs, or gallops.  ABDOMEN: Soft, non-tender, non-distended. Bowel sounds present. No organomegaly or mass.  EXTREMITIES: Trace pedal edema.  No cyanosis, or clubbing.  NEUROLOGIC: Cranial nerves II through XII are intact. Muscle strength 5/5 in all extremities. Sensation  intact. Gait not checked.  PSYCHIATRIC: The patient is alert and oriented x 3.  SKIN: No obvious rash, lesion, or ulcer.   DATA REVIEW:   CBC Recent Labs  Lab 08/10/17 0316  WBC 6.9  HGB 10.7*  HCT 34.4*  PLT 256    Chemistries  Recent Labs  Lab 08/09/17 0626  08/11/17 0325  NA 137   < > 139  K 4.5   < > 4.2  CL 101   < > 98*  CO2 27   < > 33*  GLUCOSE 153*   < > 124*  BUN 14   < > 17  CREATININE 0.86   < > 0.96  CALCIUM 9.0   < > 8.5*  AST 30  --   --   ALT 18  --   --   ALKPHOS 92  --   --   BILITOT 0.8  --   --    < > = values in this interval not displayed.    Cardiac Enzymes Recent Labs  Lab 08/10/17 0316  TROPONINI <0.03    Microbiology Results  Results for orders placed or performed during the hospital encounter of 08/09/17  Body fluid culture     Status: None (Preliminary result)   Collection Time: 08/10/17 11:15 AM  Result Value Ref Range Status   Specimen Description   Final    PERITONEAL Performed at [facility], [address] [place], [place] [address]    Special Requests   Final    NONE Performed at [facility], [address] [place], [place] [address]    Gram Stain   Final    MODERATE WBC PRESENT, PREDOMINANTLY PMN NO ORGANISMS SEEN    Culture   Final    NO GROWTH < 24 HOURS Performed at [facility], [place] [address], [place], [place] [address]    Report Status PENDING  Incomplete    RADIOLOGY:  Dg Chest Right Decubitus  Result Date: 08/11/2017 CLINICAL DATA:  Assess for pleural effusion. EXAM: CHEST - RIGHT DECUBITUS COMPARISON:  08/09/2017 FINDINGS: Right-side-down decubitus views show a small layering pleural effusion on the right. There also may be a small amount on the left but this is difficult to say with certainty on the right side down film. IMPRESSION: Small layering effusion on the right. Electronically Signed   By: Nelson Chimes M.D.   On: 08/11/2017 08:14   US Paracentesis  Result Date:  08/10/2017 INDICATION:  62 year old with abdominal ascites. Request for a diagnostic paracentesis. EXAM: ULTRASOUND GUIDED DIAGNOSTIC PARACENTESIS MEDICATIONS: None. COMPLICATIONS: None immediate. PROCEDURE: Informed written consent was obtained from the patient after a discussion of the risks, benefits and alternatives to treatment. A timeout was performed prior to the initiation of the procedure. Initial ultrasound scanning demonstrates a moderate amount of ascites within the right abdomen, adjacent to the liver. The right abdomen was prepped and draped in the usual sterile fashion. 1% lidocaine was used for local anesthesia. Following this, a 6 Fr Safe-T-Centesis catheter was introduced with real-time ultrasound guidance. An ultrasound image was saved for documentation purposes. The paracentesis was performed. The catheter was removed and a dressing was applied. The patient tolerated the procedure well without immediate post procedural complication. FINDINGS: A total of approximately 800 mL of yellow fluid was removed. Samples were sent to the laboratory as requested by the clinical team. IMPRESSION: Successful ultrasound-guided paracentesis yielding 800 mL of peritoneal fluid. Electronically Signed   By: Markus Daft M.D.   On: 08/10/2017 12:03     Management plans discussed with the patient, family and they are in agreement.  CODE STATUS:     Code Status Orders  (From admission, onward)        Start     Ordered   08/09/17 1444  Full code  Continuous     08/09/17 1443    Code Status History    Date Active Date Inactive Code Status Order ID Comments User Context   05/26/2017 0211 05/26/2017 1627 Full Code [id number]  Lance Coon, MD Inpatient   12/24/2014 0728 12/25/2014 1359 Full Code [id number]  Hillary Bow, MD ED      TOTAL TIME TAKING CARE OF THIS PATIENT: 35 minutes.    Loletha Grayer M.D on 08/11/2017 at 3:43 PM  Between 7am to 6pm - Pager - [phone]  After 6pm go to  [email or web address] - password [email or web address] [facility]  [phone]  CC: Primary care physician; Idelle Crouch, MD

## 2017-08-11 NOTE — Progress Notes (Signed)
Patient for discharge today.  Reviewed patient's appointments.  Patient has a follow-up appointment with Dr. Doy Hutching on 08/14/2017 and Dr. Clayborn Bigness on 08/19/2017.  Patient also scheduled for colonoscopy on 08/17/2017. Originally, patient's new patient appointment in the [facility] was scheduled for 08/17/2017 at 1:20 p.m.   Therefore, new patient appointment in the [facility] was rescheduled for Tuesday, 08/25/2017 at 8:20 a.m.  Reviewed these appointments with patient and significant other.  Encouraged patient to keep these appointments.  Patient verbalized understanding.    Roanna Epley, RN, BSN, [facility] Cardiovascular and Pulmonary Nurse Navigator

## 2017-08-11 NOTE — Discharge Instructions (Signed)
Ascites °Ascites is a collection of excess fluid in the abdomen. Ascites can range from mild to severe. It can get worse without treatment. °What are the causes? °Possible causes include: °· Cirrhosis. This is the most common cause of ascites. °· Infection or inflammation in the abdomen. °· Cancer in the abdomen. °· Heart failure. °· Kidney disease. °· Inflammation of the pancreas. °· Clots in the veins of the liver. ° °What are the signs or symptoms? °Signs and symptoms may include: °· A feeling of fullness in your abdomen. This is common. °· An increase in the size of your abdomen or your waist. °· Swelling in your legs. °· Swelling of the scrotum in men. °· Difficulty breathing. °· Abdominal pain. °· Sudden weight gain. ° °If the condition is mild, you may not have symptoms. °How is this diagnosed? °To make a diagnosis, your health care provider will: °· Ask about your medical history. °· Perform a physical exam. °· Order imaging tests, such as an ultrasound or CT scan of your abdomen. ° °How is this treated? °Treatment depends on the cause of the ascites. It may include: °· Taking a pill to make you urinate. This is called a water pill (diuretic pill). °· Strictly reducing your salt (sodium) intake. Salt can cause extra fluid to be kept in the body, and this makes ascites worse. °· Having a procedure to remove fluid from your abdomen (paracentesis). °· Having a procedure to transfer fluid from your abdomen into a vein. °· Having a procedure that connects two of the major veins within your liver and relieves pressure on your liver (TIPS procedure). ° °Ascites may go away or improve with treatment of the condition that caused it. °Follow these instructions at home: °· Keep track of your weight. To do this, weigh yourself at the same time every day and record your weight. °· Keep track of how much you drink and any changes in the amount you urinate. °· Follow any instructions that your health care provider gives  you about how much to drink. °· Try not to eat salty (high-sodium) foods. °· Take medicines only as directed by your health care provider. °· Keep all follow-up visits as directed by your health care provider. This is important. °· Report any changes in your health to your health care provider, especially if you develop new symptoms or your symptoms get worse. °Contact a health care provider if: °· Your gain more than 3 pounds in 3 days. °· Your abdominal size or your waist size increases. °· You have new swelling in your legs. °· The swelling in your legs gets worse. °Get help right away if: °· You develop a fever. °· You develop confusion. °· You develop new or worsening difficulty breathing. °· You develop new or worsening abdominal pain. °· You develop new or worsening swelling in the scrotum (in men). °This information is not intended to replace advice given to you by your health care provider. Make sure you discuss any questions you have with your health care provider. °Document Released: 04/28/2005 Document Revised: 09/05/2015 Document Reviewed: 11/25/2013 °Elsevier Interactive Patient Education © 2018 [employer] ° °

## 2017-08-11 NOTE — Care Management (Signed)
Patient is in agreement with home health nurse and social work. [facility]. Requested orders for [facility] protocol. Referral accepted

## 2017-08-12 LAB — ACID FAST SMEAR (AFB, MYCOBACTERIA): Acid Fast Smear: NEGATIVE

## 2017-08-13 LAB — BODY FLUID CULTURE: CULTURE: NO GROWTH

## 2017-08-14 ENCOUNTER — Encounter: Payer: Self-pay | Admitting: *Deleted

## 2017-08-15 LAB — TOTAL BILIRUBIN, BODY FLUID: Total bilirubin, fluid: 0.2 mg/dL

## 2017-08-17 ENCOUNTER — Ambulatory Visit: Admission: RE | Admit: 2017-08-17 | Payer: Medicare Other | Source: Ambulatory Visit | Admitting: Gastroenterology

## 2017-08-17 ENCOUNTER — Other Ambulatory Visit: Payer: Self-pay

## 2017-08-17 ENCOUNTER — Encounter: Admission: RE | Payer: Self-pay | Source: Ambulatory Visit

## 2017-08-17 ENCOUNTER — Ambulatory Visit: Payer: Medicare Other | Admitting: Family

## 2017-08-17 SURGERY — COLONOSCOPY WITH PROPOFOL
Anesthesia: General

## 2017-08-17 NOTE — Patient Outreach (Signed)
[facility]) Care Management  08/17/2017  John Frank February 21, 1956 [id number]     EMMI-General Discharge RED ON EMMI ALERT Day # 1 Date: 08/14/17 Red Alert Reason: "Unfilled prescriptions? Yes"   Outreach attempt #1 to patient. Spoke with patient who voices he is doing well. Reviewed and addressed red alert with patient. He reports that he has all his meds and denies any questions/concerns regarding them.       Plan: RN CM will close case at this time as no further interventions needed.    Enzo Montgomery, RN,BSN,CCM [facility] Telephonic Care Management Coordinator Direct Phone: [phone] Toll Free: [phone] Fax: [phone]
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
## 2017-08-22 NOTE — Progress Notes (Deleted)
   Patient ID: MCCLELLAN DEMARAIS, male    DOB: 10-23-1955, 62 y.o.   MRN: [id number]  HPI  Mr Hinkle is a 62 y/o male with a history of  Echo report from 04/13/17 reviewed and showed an EF of 30% along with mild MR/TR.  Admitted 08/09/17 due to acute on chronic HF. Initially needed IV Lasix and then transitioned to oral diuretics. Paracentesis was done due to ascites and antibiotics were also given. Discharged after 2 days.   He presents today for his initial visit with a chief complaint of   Review of Systems    Physical Exam  Assessment & Plan:  1: Chronic heart failure with reduced ejection fraction- - NYHA class - saw cardiology Clayborn Bigness) 06/12/17 - saw pulmonologist Raul Del) 06/22/17 - BNP 08/09/17 was 756.0  2: HTN- - saw PCP ([facility]) 06/04/17 - BMP 08/11/17 reviewed and showed sodium 139, potassium 4.2 and GFR >60  3: Diabetes- - A1c 08/09/17 was 7.1%

## 2017-08-25 ENCOUNTER — Telehealth: Payer: Self-pay | Admitting: Family

## 2017-08-25 ENCOUNTER — Ambulatory Visit: Payer: Medicare Other | Admitting: Family

## 2017-08-25 NOTE — Telephone Encounter (Signed)
Patient did not show for his Heart Failure Clinic appointment on 08/25/17. Will attempt to reschedule.

## 2017-08-26 ENCOUNTER — Ambulatory Visit (INDEPENDENT_AMBULATORY_CARE_PROVIDER_SITE_OTHER): Payer: Medicare Other | Admitting: Gastroenterology

## 2017-08-26 ENCOUNTER — Other Ambulatory Visit
Admission: RE | Admit: 2017-08-26 | Discharge: 2017-08-26 | Disposition: A | Discharge status: Home / Self Care | Payer: Medicare Other | Source: Ambulatory Visit | Attending: Gastroenterology | Admitting: Gastroenterology

## 2017-08-26 ENCOUNTER — Encounter: Payer: Self-pay | Admitting: Gastroenterology

## 2017-08-26 VITALS — BP 108/64 | HR 82 | Ht 64.0 in | Wt 147.4 lb

## 2017-08-26 DIAGNOSIS — R188 Other ascites: Secondary | ICD-10-CM | POA: Diagnosis not present

## 2017-08-26 DIAGNOSIS — I509 Heart failure, unspecified: Secondary | ICD-10-CM | POA: Diagnosis present

## 2017-08-26 DIAGNOSIS — K7460 Unspecified cirrhosis of liver: Secondary | ICD-10-CM | POA: Diagnosis not present

## 2017-08-26 DIAGNOSIS — I110 Hypertensive heart disease with heart failure: Secondary | ICD-10-CM | POA: Insufficient documentation

## 2017-08-26 DIAGNOSIS — D509 Iron deficiency anemia, unspecified: Secondary | ICD-10-CM | POA: Diagnosis not present

## 2017-08-26 LAB — PROTIME-INR
INR: 1.14
PROTHROMBIN TIME: 14.5 s (ref 11.4–15.2)

## 2017-08-26 MED ORDER — FERROUS SULFATE 325 (65 FE) MG PO TABS: 325.0000 mg | ORAL_TABLET | Freq: Every day | ORAL | 0 refills | Status: DC

## 2017-08-26 NOTE — Progress Notes (Signed)
John Bellows MD, MRCP(U.K) [address]  [place]  [place], [place] [address]  Main: [phone]  Fax: [phone]   Primary Care Physician: Idelle Crouch, MD  Primary Gastroenterologist:  Dr. Jonathon Frank   Chief Complaint  Patient presents with  . Hospitalization Follow-up    HPI: John Frank is a 62 y.o. male   He is here today to see me for hospital follow up   Summary of history :  He has been seen previously for constipation.  EGD in 2016 - normal. Ct abdomen in 01/2017 sowed right inguinal hernia. Labs 05/2017 : Hb 10.9 and MCV 95GLOVF metabolic panel-normal. HIV negative . H/o GERD,COPD   Interval history 08/03/2017 -08/26/17   08/03/17- Iron studies indicate possible deficiency. Normal B12, folate.H pylori stool antigen negative.     He was admitted in 08/2017 for CHF , EF 30% , right pleural effusion and ascites. Treated with diuresis , was noted to have ascites and a tap after giving him antibiotics showed high white cell count. GI was not consulted. CT abdomen showed ascites, possible early cirrhosis, right pleural effusion. Ascites fluid culture was negative . Ascitic fluid wcc 664 with 16% neutrophils. SAAG 1.6 which indicates possible portal hypertension   Doing well since discharge. Has a bowel movement every 2 days , not hard. Abdominal pain is much better. Feels his abdomen has fluid. He is on aldactone and lasix. No canned foods, no frozen meals For lunch has roast beef sandwiches . Uses [employer] . He did not show up to his heart failure appointment yesterday.   CBC Latest Ref Rng & Units 08/10/2017 08/09/2017 05/26/2017  WBC 3.8 - 10.6 K/uL 6.9 7.9 7.0  Hemoglobin 13.0 - 18.0 g/dL 10.7(L) 10.9(L) 10.9(L)  Hematocrit 40.0 - 52.0 % 34.4(L) 34.1(L) 33.3(L)  Platelets 150 - 440 K/uL 256 277 290   Hepatic Function Latest Ref Rng & Units 08/09/2017 08/03/2017 05/25/2017  Total Protein 6.5 - 8.1 g/dL 7.4 7.3 8.2(H)  Albumin 3.5 - 5.0 g/dL 3.7 4.0 4.3    AST 15 - 41 U/L '30 28 27  ' ALT 17 - 63 U/L 18 15(L) 23  Alk Phosphatase 38 - 126 U/L 92 88 108  Total Bilirubin 0.3 - 1.2 mg/dL 0.8 1.2 1.3(H)       Current Outpatient Medications  Medication Sig Dispense Refill  . albuterol (PROVENTIL HFA;VENTOLIN HFA) 108 (90 Base) MCG/ACT inhaler Inhale 2 puffs into the lungs every 6 (six) hours as needed for wheezing or shortness of breath. 1 Inhaler 2  . aspirin EC 81 MG tablet Take 81 mg by mouth daily.    Marland Kitchen atorvastatin (LIPITOR) 40 MG tablet TAKE 1 TABLET (40 MG TOTAL) BY MOUTH DAILY. 90 tablet 1  . budesonide-formoterol (SYMBICORT) 160-4.5 MCG/ACT inhaler Inhale 2 puffs into the lungs 2 (two) times daily. (Patient taking differently: Inhale 2 puffs into the lungs daily. ) 1 Inhaler 3  . buPROPion (WELLBUTRIN) 75 MG tablet Take 75 mg by mouth daily.    . clopidogrel (PLAVIX) 75 MG tablet TAKE 1 TABLET (75 MG TOTAL) BY MOUTH DAILY. 90 tablet 3  . enalapril (VASOTEC) 10 MG tablet Take 10 mg by mouth daily.  11  . furosemide (LASIX) 40 MG tablet Take 1 tablet (40 mg total) by mouth daily. 30 tablet 0  . ipratropium-albuterol (DUONEB) 0.5-2.5 (3) MG/3ML SOLN Take 3 mLs by nebulization every 6 (six) hours as needed (wheezing; shortness of breath).    Marland Kitchen JANUVIA 100 MG  tablet TAKE 1 TABLET (100 MG TOTAL) BY MOUTH DAILY. 90 tablet 1  . levETIRAcetam (KEPPRA) 500 MG tablet Take 500 mg by mouth 2 (two) times daily.    . metFORMIN (GLUCOPHAGE) 1000 MG tablet TAKE 1 TABLET (1,000 MG TOTAL) BY MOUTH 2 (TWO) TIMES DAILY WITH A MEAL. 180 tablet 1  . metoprolol succinate (TOPROL-XL) 100 MG 24 hr tablet Take by mouth.    . nitroGLYCERIN (NITROSTAT) 0.4 MG SL tablet Place 0.4 mg under the tongue every 5 (five) minutes as needed for chest pain.    . ONE TOUCH ULTRA TEST test strip USE TO TEST BLOOD SUGAR ONCE DAILY 100 each 3  . pantoprazole (PROTONIX) 40 MG tablet Take 1 tablet (40 mg total) by mouth every morning. 90 tablet 1  . spironolactone (ALDACTONE) 25 MG  tablet Take 1 tablet (25 mg total) by mouth daily. 30 tablet 0  . sucralfate (CARAFATE) 1 g tablet Take 1 g by mouth 4 (four) times daily -  with meals and at bedtime.      No current facility-administered medications for this visit.     Allergies as of 08/26/2017  . (No Known Allergies)    ROS:  General: Negative for anorexia, weight loss, fever, chills, fatigue, weakness. ENT: Negative for hoarseness, difficulty swallowing , nasal congestion. CV: Negative for chest pain, angina, palpitations, dyspnea on exertion, peripheral edema.  Respiratory: Negative for dyspnea at rest, dyspnea on exertion, cough, sputum, wheezing.  GI: See history of present illness. GU:  Negative for dysuria, hematuria, urinary incontinence, urinary frequency, nocturnal urination.  Endo: Negative for unusual weight change.    Physical Examination:   BP 108/64 (BP Location: Left Arm, Patient Position: Sitting, Cuff Size: Normal)   Pulse 82   Ht '5\' 4"'  (1.626 m)   Wt 147 lb 6.4 oz (66.9 kg)   BMI 25.30 kg/m   General: Well-nourished, well-developed in no acute distress.  Eyes: No icterus. Conjunctivae pink. Mouth: Oropharyngeal mucosa moist and pink , no lesions erythema or exudate. Lungs: Clear to auscultation bilaterally. Non-labored. Heart: Regular rate and rhythm, no murmurs rubs or gallops.  Abdomen: Bowel sounds are normal, nontender, moderately distended with free fluid on percussion , no hepatosplenomegaly or masses, no abdominal bruits or hernia , no rebound or guarding.   Extremities: No lower extremity edema. No clubbing or deformities. Neuro: Alert and oriented x 3.  Grossly intact. Skin: Warm and dry, no jaundice.   Psych: Alert and cooperative, normal mood and affect.   Imaging Studies: Dg Chest 2 View  Result Date: 08/09/2017 CLINICAL DATA:  Pleural effusion and ascites. EXAM: CHEST - 2 VIEW COMPARISON:  05/25/2017 FINDINGS: Left chest wall ICD is noted with leads in the right atrial  appendage and right ventricle. No pleural effusion or edema identified. No airspace opacities. The visualized osseous structures are unremarkable. IMPRESSION: 1. No acute cardiopulmonary abnormalities. Electronically Signed   By: Kerby Moors M.D.   On: 08/09/2017 09:32   Dg Chest Right Decubitus  Result Date: 08/11/2017 CLINICAL DATA:  Assess for pleural effusion. EXAM: CHEST - RIGHT DECUBITUS COMPARISON:  08/09/2017 FINDINGS: Right-side-down decubitus views show a small layering pleural effusion on the right. There also may be a small amount on the left but this is difficult to say with certainty on the right side down film. IMPRESSION: Small layering effusion on the right. Electronically Signed   By: Nelson Chimes M.D.   On: 08/11/2017 08:14   Ct Abdomen Pelvis W Contrast  Result Date: 08/09/2017 CLINICAL DATA:  ABDOMINAL PAIN. EXAM: CT ABDOMEN AND PELVIS WITH CONTRAST TECHNIQUE: Multidetector CT imaging of the abdomen and pelvis was performed using the standard protocol following bolus administration of intravenous contrast. CONTRAST:  159m ISOVUE-300 IOPAMIDOL (ISOVUE-300) INJECTION 61% COMPARISON:  02/02/2017. FINDINGS: Lower chest: Small right pleural effusion. Hepatobiliary: Mild hepatic steatosis. Relative hypertrophy of the lateral segment of left lobe of liver. No focal liver abnormality. Previous cholecystectomy. No biliary dilatation. Pancreas: Unremarkable. No pancreatic ductal dilatation or surrounding inflammatory changes. Spleen: Unchanged subcapsular calcifications overlying the lateral aspect of the spleen. Adrenals/Urinary Tract: The adrenal glands appear normal. Unremarkable appearance of the kidneys. No mass or hydronephrosis. The urinary bladder appears normal. Stomach/Bowel: The stomach appears normal. No abnormal dilatation of the small or large bowel loops. The appendix is visualized and appears normal. Distal colonic diverticula identified scratch set a few distal colonic  diverticula are noted. No acute inflammation. Vascular/Lymphatic: Aortic atherosclerosis. No aneurysm. The portal vein and hepatic veins appear patent. Left-sided inferior vena cava identified. Peripancreatic node is unchanged measuring 1.4 cm. No pelvic or inguinal adenopathy. Reproductive: Prostate is unremarkable. Other: There is a moderate volume of ascites within the abdomen and pelvis which is new compared with previous exam. No focal fluid collections identified. Bilateral fact containing inguinal hernias identified. Ascites extends into the right inguinal canal. Musculoskeletal: Degenerative disc disease identified within the lumbar spine. No suspicious bone lesions. IMPRESSION: 1. Interval development of moderate volume of ascites within the abdomen and pelvis. Etiology indeterminate. Consider further evaluation with diagnostic paracentesis. 2. There is hypertrophy of the lateral segment of left lobe of liver. Nonspecific but may seen with early cirrhosis. 3. New right pleural effusion 4.  Aortic Atherosclerosis (ICD10-I70.0). 5. Bilateral fat containing inguinal hernias. Electronically Signed   By: TKerby MoorsM.D.   On: 08/09/2017 07:52   UKoreaParacentesis  Result Date: 08/10/2017 INDICATION: 62year old with abdominal ascites. Request for a diagnostic paracentesis. EXAM: ULTRASOUND GUIDED DIAGNOSTIC PARACENTESIS MEDICATIONS: None. COMPLICATIONS: None immediate. PROCEDURE: Informed written consent was obtained from the patient after a discussion of the risks, benefits and alternatives to treatment. A timeout was performed prior to the initiation of the procedure. Initial ultrasound scanning demonstrates a moderate amount of ascites within the right abdomen, adjacent to the liver. The right abdomen was prepped and draped in the usual sterile fashion. 1% lidocaine was used for local anesthesia. Following this, a 6 Fr Safe-T-Centesis catheter was introduced with real-time ultrasound guidance. An  ultrasound image was saved for documentation purposes. The paracentesis was performed. The catheter was removed and a dressing was applied. The patient tolerated the procedure well without immediate post procedural complication. FINDINGS: A total of approximately 800 mL of yellow fluid was removed. Samples were sent to the laboratory as requested by the clinical team. IMPRESSION: Successful ultrasound-guided paracentesis yielding 800 mL of peritoneal fluid. Electronically Signed   By: AMarkus DaftM.D.   On: 08/10/2017 12:03    Assessment and Plan:   RKAEDON FANELLIis a 62y.o. y/o male here to follow up to his hospital discharge. Previously seen me for constipation . Recent hospitalization for respiratory failure, CHF, ascites and pleural effusion . Hard to determine but likely may have a combination of CHF with an element of possible cirrhosis based on imaging . No evidence of portal hypertension biochemical or radiologically. SAAG is > 1.1 which can indicate ascites from portal hypertension. Presently on diuretics started by Cardiology for CHF.    Plan  1. PO iron for possible iron deficiency  2. Needs cardiology follow up to optimize diuretics , which will help the ascites as well. Advised on strict low sodium diet  3. When optimized from cardiac point of view can consider EGD to screen for varices. Will screen for Hep B/C. His cirrhosis can also be secondary to his cardiac issues.  4. Maintain daily weights  Dr John Bellows  MD,MRCP [facility]) Follow up in 4 weeks

## 2017-08-27 LAB — HEPATITIS B SURFACE ANTIBODY, QUANTITATIVE: HEPATITIS B-POST: 139.8 m[IU]/mL

## 2017-08-27 LAB — HEPATITIS A ANTIBODY, TOTAL: Hep A Total Ab: NEGATIVE

## 2017-08-27 LAB — HEPATITIS C ANTIBODY

## 2017-08-28 NOTE — Progress Notes (Signed)
Patient ID: John Frank, male    DOB: [birth year], 62 y.o.   MRN: [id number]  HPI  John Frank is a 62 y/o male with a history of CAD, DM, hyperlipidemia, HTN, GERD, COPD, obstructive sleep apnea, chronic heart failure and current tobacco use.   Echo report from 04/13/17 reviewed and showed an EF of 30% along with mild John/TR.  Admitted 08/09/17 due to acute on chronic HF. Initially needed IV Lasix and then transitioned to oral diuretics. Paracentesis was done due to ascites and antibiotics were also given. Discharged after 2 days.   He presents today for his initial visit with a chief complaint of minimal fatigue upon moderate exertion. He says this has been present for several months and has been improving. He has associated cough, dizziness, headaches and difficulty sleeping along with this. He denies any abdominal distention, palpitations, edema, chest pain or shortness of breath.   Past Medical History:  Diagnosis Date  . CAD (coronary artery disease)    s/p stents in 2017  . CHF (congestive heart failure) ([place])   . COPD (chronic obstructive pulmonary disease) ([place])   . Diabetes ([place])   . GERD (gastroesophageal reflux disease)   . Heart attack ([place])   . Hyperlipidemia   . Hypertension   . Presence of permanent cardiac pacemaker   . Shortness of breath dyspnea   . Sleep apnea    Past Surgical History:  Procedure Laterality Date  . cardiac stents    . CARDIAC SURGERY     defibrillator and pacemaker  . ESOPHAGOGASTRODUODENOSCOPY N/A 09/19/2014   Procedure: ESOPHAGOGASTRODUODENOSCOPY (EGD);  Surgeon: Lucilla Lame, MD;  Location: [facility] END[facility]COPY;  Service: Endoscopy;  Laterality: N/A;   Family History  Problem Relation Age of Onset  . Diabetes Mother   . Heart attack Father 39   Social History   Tobacco Use  . Smoking status: Current Every Day Smoker    Packs/day: 1.00    Years: 40.00    Pack years: 40.00    Types: Cigarettes  . Smokeless tobacco: Former Network engineer  Use Topics  . Alcohol use: No   No Known Allergies Prior to Admission medications   Medication Sig Start Date End Date Taking? Authorizing Provider  albuterol (PROVENTIL HFA;VENTOLIN HFA) 108 (90 Base) MCG/ACT inhaler Inhale 2 puffs into the lungs every 6 (six) hours as needed for wheezing or shortness of breath. 05/26/17  Yes Henreitta Leber, MD  aspirin EC 81 MG tablet Take 81 mg by mouth daily.   Yes [provider]  atorvastatin (LIPITOR) 40 MG tablet TAKE 1 TABLET (40 MG TOTAL) BY MOUTH DAILY. 04/10/17  Yes Kathrine Haddock, NP  budesonide-formoterol (SYMBICORT) 160-4.5 MCG/ACT inhaler Inhale 2 puffs into the lungs 2 (two) times daily. Patient taking differently: Inhale 2 puffs into the lungs daily.  06/15/15  Yes Kathrine Haddock, NP  buPROPion (WELLBUTRIN) 75 MG tablet Take 75 mg by mouth daily. 03/26/16 05/08/18 Yes [provider]  clopidogrel (PLAVIX) 75 MG tablet TAKE 1 TABLET (75 MG TOTAL) BY MOUTH DAILY. 12/08/16  Yes Kathrine Haddock, NP  enalapril (VASOTEC) 10 MG tablet Take 10 mg by mouth daily. 08/19/17  Yes [provider]  ferrous sulfate (FERROUSUL) 325 (65 FE) MG tablet Take 1 tablet (325 mg total) by mouth daily with breakfast. 08/26/17 10/26/17 Yes [name], MD  furosemide (LASIX) 40 MG tablet Take 1 tablet (40 mg total) by mouth daily. 08/11/17  Yes Loletha Grayer, MD  ipratropium-albuterol (DUONEB) 0.5-2.5 (  3) MG/3ML SOLN Take 3 mLs by nebulization every 6 (six) hours as needed (wheezing; shortness of breath).   Yes [provider]  JANUVIA 100 MG tablet TAKE 1 TABLET (100 MG TOTAL) BY MOUTH DAILY. 04/10/17  Yes Kathrine Haddock, NP  metFORMIN (GLUCOPHAGE) 1000 MG tablet TAKE 1 TABLET (1,000 MG TOTAL) BY MOUTH 2 (TWO) TIMES DAILY WITH A MEAL. 09/12/16  Yes Kathrine Haddock, NP  metoprolol succinate (TOPROL-XL) 100 MG 24 hr tablet Take by mouth. 08/19/17  Yes [provider]  nitroGLYCERIN (NITROSTAT) 0.4 MG SL tablet Place 0.4 mg under the  tongue every 5 (five) minutes as needed for chest pain.   Yes [provider]  ONE TOUCH ULTRA TEST test strip USE TO TEST BLOOD SUGAR ONCE DAILY 10/07/16  Yes Johnson, Megan P, DO  pantoprazole (PROTONIX) 40 MG tablet Take 1 tablet (40 mg total) by mouth every morning. 06/29/17  Yes [name], MD  spironolactone (ALDACTONE) 25 MG tablet Take 1 tablet (25 mg total) by mouth daily. 08/12/17  Yes Wieting, Richard, MD  sucralfate (CARAFATE) 1 g tablet Take 1 g by mouth 4 (four) times daily -  with meals and at bedtime.  05/15/17 05/15/18 Yes [provider]  levETIRAcetam (KEPPRA) 500 MG tablet Take 500 mg by mouth 2 (two) times daily.    [provider]   Review of Systems  Constitutional: Positive for fatigue. Negative for appetite change.  HENT: Negative for congestion, postnasal drip and sore throat.   Eyes: Negative.   Respiratory: Positive for cough. Negative for chest tightness and shortness of breath.   Cardiovascular: Negative for chest pain, palpitations and leg swelling.  Gastrointestinal: Negative for abdominal distention and abdominal pain.  Endocrine: Negative.   Genitourinary: Negative.   Musculoskeletal: Negative for back pain and neck pain.  Skin: Negative.   Allergic/Immunologic: Negative.   Neurological: Positive for dizziness and headaches (over the last 6 months). Negative for light-headedness.  Hematological: Negative for adenopathy. Does not bruise/bleed easily.  Psychiatric/Behavioral: Positive for sleep disturbance (hasn't been wearing CPAP ). Negative for dysphoric mood. The patient is not nervous/anxious.    Vitals:   08/31/17 1421  BP: 104/61  Pulse: 87  Resp: 18  SpO2: 97%  Weight: 146 lb 6 oz (66.4 kg)  Height: 5\' 4"  (1.626 m)   Wt Readings from Last 3 Encounters:  08/31/17 146 lb 6 oz (66.4 kg)  08/26/17 147 lb 6.4 oz (66.9 kg)  08/11/17 162 lb 9.6 oz (73.8 kg)   Lab Results  Component Value Date   CREATININE 0.96 08/11/2017    CREATININE 0.94 08/10/2017   CREATININE 0.86 08/09/2017   Physical Exam  Constitutional: He is oriented to person, place, and time. He appears well-developed and well-nourished.  HENT:  Head: Normocephalic and atraumatic.  Neck: Normal range of motion. Neck supple. No JVD present.  Cardiovascular: Normal rate and regular rhythm.  Pulmonary/Chest: Effort normal. He has no wheezes. He has no rales.  Abdominal: Soft. He exhibits no distension. There is no tenderness.  Musculoskeletal: He exhibits no edema or tenderness.  Neurological: He is alert and oriented to person, place, and time.  Skin: Skin is warm and dry.  Psychiatric: He has a normal mood and affect. His behavior is normal. Thought content normal.  Nursing note and vitals reviewed.  Assessment & Plan:  1: Chronic heart failure with reduced ejection fraction- - NYHA class II - euvolemic today - weighing daily and he was instructed to call for an  overnight weight gain of >2 pounds or a weekly weight gain of >5 pounds. Advised to write the weight done and bring weight chart to appointments - weight down 16.3 pounds since hospital discharge - not adding salt; using Mrs. Dash; admits that he's not reading food labels and reviewed how to read food labels so that he can keep his daily sodium intake to 2000mg  a day. Written dietary information given to him regarding this - currently drinking a 2L soda, ~20 ounces of tea and 2-3 cups of coffee daily. Drinking enough water to take his medications - discussed the importance of decreasing his daily fluid intake to closer to 60 ounces daily - saw cardiology Clayborn Bigness) 06/12/17 - saw pulmonologist Raul Del) 06/22/17 - BNP 08/09/17 was 756.0 - currently has pacemaker/defibrillator  - current BP does not allow to change enalapril to entresto - could consider titrating up metoprolol at future visits  2: HTN- - BP on the low side; may need to decrease diuretic in the future - saw PCP ([place])  06/04/17 and returns 09/02/17 - BMP 08/11/17 reviewed and showed sodium 139, potassium 4.2 and GFR >60  3: Diabetes- - not checking his glucose often and he was encouraged to check it daily - nonfasting glucose in clinic today was 161 - A1c 08/09/17 was 7.1%  4: Tobacco use- - currently smoking 3/4 ppd of cigarettes daily which is down from 2 ppd - complete cessation discussed for 3 minutes with patient and his friend  5: Obstructive sleep apnea- - has CPAP but hasn't been wearing it due to not being able to get the straps tight enough - discussed the importance of wearing his CPAP nightly and that he may need to contact the company if he can't get it tight enough  Patient did not bring his medications nor a list. Each medication was verbally reviewed with the patient and he was encouraged to bring the bottles to every visit to confirm accuracy of list.  Return in 1 month or sooner for any questions/problems before then.

## 2017-08-31 ENCOUNTER — Encounter: Payer: Self-pay | Admitting: Family

## 2017-08-31 ENCOUNTER — Ambulatory Visit: Payer: Medicare Other | Attending: Family | Admitting: Family

## 2017-08-31 VITALS — BP 104/61 | HR 87 | Resp 18 | Ht 64.0 in | Wt 146.4 lb

## 2017-08-31 DIAGNOSIS — Z833 Family history of diabetes mellitus: Secondary | ICD-10-CM | POA: Insufficient documentation

## 2017-08-31 DIAGNOSIS — I110 Hypertensive heart disease with heart failure: Secondary | ICD-10-CM | POA: Insufficient documentation

## 2017-08-31 DIAGNOSIS — I252 Old myocardial infarction: Secondary | ICD-10-CM | POA: Diagnosis not present

## 2017-08-31 DIAGNOSIS — J449 Chronic obstructive pulmonary disease, unspecified: Secondary | ICD-10-CM | POA: Diagnosis not present

## 2017-08-31 DIAGNOSIS — Z7984 Long term (current) use of oral hypoglycemic drugs: Secondary | ICD-10-CM | POA: Diagnosis not present

## 2017-08-31 DIAGNOSIS — E119 Type 2 diabetes mellitus without complications: Secondary | ICD-10-CM | POA: Insufficient documentation

## 2017-08-31 DIAGNOSIS — I5022 Chronic systolic (congestive) heart failure: Secondary | ICD-10-CM | POA: Insufficient documentation

## 2017-08-31 DIAGNOSIS — Z7982 Long term (current) use of aspirin: Secondary | ICD-10-CM | POA: Insufficient documentation

## 2017-08-31 DIAGNOSIS — Z955 Presence of coronary angioplasty implant and graft: Secondary | ICD-10-CM | POA: Insufficient documentation

## 2017-08-31 DIAGNOSIS — Z8249 Family history of ischemic heart disease and other diseases of the circulatory system: Secondary | ICD-10-CM | POA: Insufficient documentation

## 2017-08-31 DIAGNOSIS — K219 Gastro-esophageal reflux disease without esophagitis: Secondary | ICD-10-CM | POA: Insufficient documentation

## 2017-08-31 DIAGNOSIS — Z7951 Long term (current) use of inhaled steroids: Secondary | ICD-10-CM | POA: Diagnosis not present

## 2017-08-31 DIAGNOSIS — E785 Hyperlipidemia, unspecified: Secondary | ICD-10-CM | POA: Insufficient documentation

## 2017-08-31 DIAGNOSIS — Z950 Presence of cardiac pacemaker: Secondary | ICD-10-CM | POA: Insufficient documentation

## 2017-08-31 DIAGNOSIS — G4733 Obstructive sleep apnea (adult) (pediatric): Secondary | ICD-10-CM | POA: Diagnosis not present

## 2017-08-31 DIAGNOSIS — Z79899 Other long term (current) drug therapy: Secondary | ICD-10-CM | POA: Diagnosis not present

## 2017-08-31 DIAGNOSIS — Z7902 Long term (current) use of antithrombotics/antiplatelets: Secondary | ICD-10-CM | POA: Insufficient documentation

## 2017-08-31 DIAGNOSIS — I2510 Atherosclerotic heart disease of native coronary artery without angina pectoris: Secondary | ICD-10-CM | POA: Insufficient documentation

## 2017-08-31 DIAGNOSIS — Z720 Tobacco use: Secondary | ICD-10-CM

## 2017-08-31 DIAGNOSIS — I10 Essential (primary) hypertension: Secondary | ICD-10-CM

## 2017-08-31 DIAGNOSIS — F17210 Nicotine dependence, cigarettes, uncomplicated: Secondary | ICD-10-CM | POA: Insufficient documentation

## 2017-08-31 LAB — GLUCOSE, CAPILLARY: GLUCOSE-CAPILLARY: 161 mg/dL — AB (ref 65–99)

## 2017-08-31 NOTE — Patient Instructions (Addendum)
Continue weighing daily and call for an overnight weight gain of > 2 pounds or a weekly weight gain of >5 pounds.  Try to decrease fluid intake to 60 ounces daily  Smoking Cessation Quitting smoking is important to your health and has many advantages. However, it is not always easy to quit since nicotine is a very addictive drug. Oftentimes, people try 3 times or more before being able to quit. This document explains the best ways for you to prepare to quit smoking. Quitting takes hard work and a lot of effort, but you can do it. ADVANTAGES OF QUITTING SMOKING  You will live longer, feel better, and live better.  Your body will feel the impact of quitting smoking almost immediately.  Within 20 minutes, blood pressure decreases. Your pulse returns to its normal level.  After 8 hours, carbon monoxide levels in the blood return to normal. Your oxygen level increases.  After 24 hours, the chance of having a heart attack starts to decrease. Your breath, hair, and body stop smelling like smoke.  After 48 hours, damaged nerve endings begin to recover. Your sense of taste and smell improve.  After 72 hours, the body is virtually free of nicotine. Your bronchial tubes relax and breathing becomes easier.  After 2 to 12 weeks, lungs can hold more air. Exercise becomes easier and circulation improves.  The risk of having a heart attack, stroke, cancer, or lung disease is greatly reduced.  After 1 year, the risk of coronary heart disease is cut in half.  After 5 years, the risk of stroke falls to the same as a nonsmoker.  After 10 years, the risk of lung cancer is cut in half and the risk of other cancers decreases significantly.  After 15 years, the risk of coronary heart disease drops, usually to the level of a nonsmoker.  If you are pregnant, quitting smoking will improve your chances of having a healthy baby.  The people you live with, especially any children, will be healthier.  You  will have extra money to spend on things other than cigarettes. QUESTIONS TO THINK ABOUT BEFORE ATTEMPTING TO QUIT You may want to talk about your answers with your health care provider.  Why do you want to quit?  If you tried to quit in the past, what helped and what did not?  What will be the most difficult situations for you after you quit? How will you plan to handle them?  Who can help you through the tough times? Your family? Friends? A health care provider?  What pleasures do you get from smoking? What ways can you still get pleasure if you quit? Here are some questions to ask your health care provider:  How can you help me to be successful at quitting?  What medicine do you think would be best for me and how should I take it?  What should I do if I need more help?  What is smoking withdrawal like? How can I get information on withdrawal? GET READY  Set a quit date.  Change your environment by getting rid of all cigarettes, ashtrays, matches, and lighters in your home, car, or work. Do not let people smoke in your home.  Review your past attempts to quit. Think about what worked and what did not. GET SUPPORT AND ENCOURAGEMENT You have a better chance of being successful if you have help. You can get support in many ways.  Tell your family, friends, and coworkers that you are  going to quit and need their support. Ask them not to smoke around you.  Get individual, group, or telephone counseling and support. Programs are available at [employer] and health centers. Call your local health department for information about programs in your area.  Spiritual beliefs and practices may help some smokers quit.  Download a "quit meter" on your computer to keep track of quit statistics, such as how Bertha you have gone without smoking, cigarettes not smoked, and money saved.  Get a self-help book about quitting smoking and staying off tobacco. Lehigh yourself from urges to smoke. Talk to someone, go for a walk, or occupy your time with a task.  Change your normal routine. Take a different route to work. Drink tea instead of coffee. Eat breakfast in a different place.  Reduce your stress. Take a hot bath, exercise, or read a book.  Plan something enjoyable to do every day. Reward yourself for not smoking.  Explore interactive web-based programs that specialize in helping you quit. GET MEDICINE AND USE IT CORRECTLY Medicines can help you stop smoking and decrease the urge to smoke. Combining medicine with the above behavioral methods and support can greatly increase your chances of successfully quitting smoking.  Nicotine replacement therapy helps deliver nicotine to your body without the negative effects and risks of smoking. Nicotine replacement therapy includes nicotine gum, lozenges, inhalers, nasal sprays, and skin patches. Some may be available over-the-counter and others require a prescription.  Antidepressant medicine helps people abstain from smoking, but how this works is unknown. This medicine is available by prescription.  Nicotinic receptor partial agonist medicine simulates the effect of nicotine in your brain. This medicine is available by prescription. Ask your health care provider for advice about which medicines to use and how to use them based on your health history. Your health care provider will tell you what side effects to look out for if you choose to be on a medicine or therapy. Carefully read the information on the package. Do not use any other product containing nicotine while using a nicotine replacement product.  RELAPSE OR DIFFICULT SITUATIONS Most relapses occur within the first 3 months after quitting. Do not be discouraged if you start smoking again. Remember, most people try several times before finally quitting. You may have symptoms of withdrawal because your body is used to nicotine. You  may crave cigarettes, be irritable, feel very hungry, cough often, get headaches, or have difficulty concentrating. The withdrawal symptoms are only temporary. They are strongest when you first quit, but they will go away within 10-14 days. To reduce the chances of relapse, try to:  Avoid drinking alcohol. Drinking lowers your chances of successfully quitting.  Reduce the amount of caffeine you consume. Once you quit smoking, the amount of caffeine in your body increases and can give you symptoms, such as a rapid heartbeat, sweating, and anxiety.  Avoid smokers because they can make you want to smoke.  Do not let weight gain distract you. Many smokers will gain weight when they quit, usually less than 10 pounds. Eat a healthy diet and stay active. You can always lose the weight gained after you quit.  Find ways to improve your mood other than smoking. FOR MORE INFORMATION  www.smokefree.gov  Document Released: 04/22/2001 Document Revised: 09/12/2013 Document Reviewed: 08/07/2011 [facility] Patient Information 2015 [place], [place]. This information is not intended to replace advice given to you by your health care provider. Make sure  you discuss any questions you have with your health care provider.

## 2017-09-09 ENCOUNTER — Other Ambulatory Visit: Payer: Self-pay | Admitting: Unknown Physician Specialty

## 2017-09-09 LAB — FUNGUS CULTURE WITH STAIN

## 2017-09-09 LAB — FUNGAL ORGANISM REFLEX

## 2017-09-09 LAB — FUNGUS CULTURE RESULT

## 2017-09-11 ENCOUNTER — Telehealth: Payer: Self-pay | Admitting: Unknown Physician Specialty

## 2017-09-11 NOTE — Telephone Encounter (Signed)
Patient came in the office with a concern over the medication Malachy Mood had prescribed him a year ago Januvia, he no longer sees Malachy Mood so he was advised to talk with Dr Doy Hutching his current PCP regarding the questions and concerns he has about this medication.  Just FYI

## 2017-09-13 ENCOUNTER — Other Ambulatory Visit: Payer: Self-pay | Admitting: Unknown Physician Specialty

## 2017-09-15 ENCOUNTER — Ambulatory Visit (INDEPENDENT_AMBULATORY_CARE_PROVIDER_SITE_OTHER): Payer: Medicare Other | Admitting: Vascular Surgery

## 2017-09-15 ENCOUNTER — Encounter (INDEPENDENT_AMBULATORY_CARE_PROVIDER_SITE_OTHER): Payer: Self-pay | Admitting: Vascular Surgery

## 2017-09-15 VITALS — BP 113/69 | HR 85 | Resp 17 | Ht 64.0 in | Wt 148.4 lb

## 2017-09-15 DIAGNOSIS — M79604 Pain in right leg: Secondary | ICD-10-CM

## 2017-09-15 DIAGNOSIS — E119 Type 2 diabetes mellitus without complications: Secondary | ICD-10-CM

## 2017-09-15 DIAGNOSIS — M79605 Pain in left leg: Secondary | ICD-10-CM

## 2017-09-15 DIAGNOSIS — Z720 Tobacco use: Secondary | ICD-10-CM

## 2017-09-15 DIAGNOSIS — E785 Hyperlipidemia, unspecified: Secondary | ICD-10-CM | POA: Diagnosis not present

## 2017-09-15 NOTE — Progress Notes (Signed)
Subjective:    Patient ID: John Frank, male    DOB: Jul 03, 1955, 62 y.o.   MRN: [id number] Chief Complaint  Patient presents with  . New Patient (Initial Visit)    ref [place] for claudication   Presents as a new patient referred by Dr. Georgia Dom for evaluation of "claudication".  Patient was seen with his wife.  The patient endorses a progressively worsening 66-month period of claudication-like symptoms to the bilateral thighs.  The patient notes that he begins to experience cramping and pain to his thighs with activity.  The patient will stop the activity and the discomfort will resolve.  The patient also fell once during an episode of pain while walking in a parking lot.  The patient denies any rest pain or ulceration to the bilateral lower extremity.  The patient denies any recent surgery or trauma to the bilateral lower extremity.  The patient denies ever undergoing any vascular studies in the past.  The patient denies any fever, nausea vomiting.  Review of Systems  Constitutional: Negative.   HENT: Negative.   Eyes: Negative.   Respiratory: Negative.   Cardiovascular:       Thigh claudication  Gastrointestinal: Negative.   Endocrine: Negative.   Genitourinary: Negative.   Musculoskeletal: Negative.   Skin: Negative.   Allergic/Immunologic: Negative.   Neurological: Negative.   Hematological: Negative.   Psychiatric/Behavioral: Negative.       Objective:   Physical Exam  Constitutional: He is oriented to person, place, and time. He appears well-developed and well-nourished. No distress.  HENT:  Head: Normocephalic and atraumatic.  Right Ear: External ear normal.  Left Ear: External ear normal.  Eyes: Pupils are equal, round, and reactive to light. Conjunctivae and EOM are normal.  Neck: Normal range of motion.  Cardiovascular: Normal rate, regular rhythm and normal heart sounds.  Pulses:      Radial pulses are 2+ on the right side, and 2+ on the left side.  Hard to  palpate pedal pulses however the bilateral feet are warm.  Pulmonary/Chest: Effort normal and breath sounds normal.  Musculoskeletal: Normal range of motion. He exhibits no edema.  Neurological: He is alert and oriented to person, place, and time.  Skin: Skin is warm and dry. He is not diaphoretic.  Psychiatric: He has a normal mood and affect. His behavior is normal. Judgment and thought content normal.  Vitals reviewed.  BP 113/69 (BP Location: Right Arm)   Pulse 85   Resp 17   Ht 5\' 4"  (1.626 m)   Wt 148 lb 6.4 oz (67.3 kg)   BMI 25.47 kg/m   Past Medical History:  Diagnosis Date  . CAD (coronary artery disease)    s/p stents in 2017  . CHF (congestive heart failure) ([place])   . COPD (chronic obstructive pulmonary disease) ([place])   . Diabetes ([place])   . GERD (gastroesophageal reflux disease)   . Heart attack ([place])   . Hyperlipidemia   . Hypertension   . Presence of permanent cardiac pacemaker   . Shortness of breath dyspnea   . Sleep apnea    Social History   Socioeconomic History  . Marital status: Single    Spouse name: Not on file  . Number of children: Not on file  . Years of education: Not on file  . Highest education level: Not on file  Occupational History  . Not on file  Social Needs  . Financial resource strain: Not on file  . Food insecurity:  Worry: Not on file    Inability: Not on file  . Transportation needs:    Medical: Not on file    Non-medical: Not on file  Tobacco Use  . Smoking status: Current Every Day Smoker    Packs/day: 1.00    Years: 40.00    Pack years: 40.00    Types: Cigarettes  . Smokeless tobacco: Former Network engineer and Sexual Activity  . Alcohol use: No  . Drug use: No  . Sexual activity: Yes  Lifestyle  . Physical activity:    Days per week: Not on file    Minutes per session: Not on file  . Stress: Not on file  Relationships  . Social connections:    Talks on phone: Not on file    Gets together: Not on file     Attends religious service: Not on file    Active member of club or organization: Not on file    Attends meetings of clubs or organizations: Not on file    Relationship status: Not on file  . Intimate partner violence:    Fear of current or ex partner: Not on file    Emotionally abused: Not on file    Physically abused: Not on file    Forced sexual activity: Not on file  Other Topics Concern  . Not on file  Social History Narrative   Independent at baseline.  Lives at home with his family   Past Surgical History:  Procedure Laterality Date  . cardiac stents    . CARDIAC SURGERY     defibrillator and pacemaker  . ESOPHAGOGASTRODUODENOSCOPY N/A 09/19/2014   Procedure: ESOPHAGOGASTRODUODENOSCOPY (EGD);  Surgeon: Lucilla Lame, MD;  Location: [facility] ENDOSCOPY;  Service: Endoscopy;  Laterality: N/A;   Family History  Problem Relation Age of Onset  . Diabetes Mother   . Heart attack Father 60   No Known Allergies     Assessment & Plan:  Presents as a new patient referred by Dr. Georgia Dom for evaluation of "claudication".  Patient was seen with his wife.  The patient endorses a progressively worsening 67-month period of claudication-like symptoms to the bilateral thighs.  The patient notes that he begins to experience cramping and pain to his thighs with activity.  The patient will stop the activity and the discomfort will resolve.  The patient also fell once during an episode of pain while walking in a parking lot.  The patient denies any rest pain or ulceration to the bilateral lower extremity.  The patient denies any recent surgery or trauma to the bilateral lower extremity.  The patient denies ever undergoing any vascular studies in the past.  The patient denies any fever, nausea vomiting.  1. Lower extremity pain, bilateral - New Patient with multiple risk factors for peripheral artery disease Patient with progressively worsening claudication-like symptoms Hard to palpate pedal pulses on  exam I will bring the patient back and have him undergo an aortoiliac and ABI to assess for any contributing peripheral artery disease I have discussed with the patient at length the risk factors for and pathogenesis of atherosclerotic disease and encouraged a healthy diet, regular exercise regimen and blood pressure / glucose control.  The patient was encouraged to call the office in the interim if he experiences any claudication like symptoms, rest pain or ulcers to his feet / toes.  - VAS US AORTA/IVC/ILIACS; Future - VAS [place] ABI WITH/WO TBI; Future  2. Tobacco abuse - Stable We had a discussion for  approximately five minutes regarding the absolute need for smoking cessation due to the deleterious nature of tobacco on the vascular system. We discussed the tobacco use would diminish patency of any intervention, and likely significantly worsen progressio of disease. We discussed multiple agents for quitting including replacement therapy or medications to reduce cravings such as Chantix. The patient voices their understanding of the importance of smoking cessation.  3. Hyperlipidemia, unspecified hyperlipidemia type - Stable Encouraged good control as its slows the progression of atherosclerotic disease  4. Diabetes mellitus without complication ([place]) - Stable Encouraged good control as its slows the progression of atherosclerotic disease  Current Outpatient Medications on File Prior to Visit  Medication Sig Dispense Refill  . albuterol (PROVENTIL HFA;VENTOLIN HFA) 108 (90 Base) MCG/ACT inhaler Inhale 2 puffs into the lungs every 6 (six) hours as needed for wheezing or shortness of breath. 1 Inhaler 2  . aspirin EC 81 MG tablet Take 81 mg by mouth daily.    Marland Kitchen atorvastatin (LIPITOR) 40 MG tablet TAKE 1 TABLET (40 MG TOTAL) BY MOUTH DAILY. 90 tablet 1  . budesonide-formoterol (SYMBICORT) 160-4.5 MCG/ACT inhaler Inhale 2 puffs into the lungs 2 (two) times daily. (Patient taking differently: Inhale  2 puffs into the lungs daily. ) 1 Inhaler 3  . buPROPion (WELLBUTRIN) 75 MG tablet Take 75 mg by mouth daily.    . clopidogrel (PLAVIX) 75 MG tablet TAKE 1 TABLET (75 MG TOTAL) BY MOUTH DAILY. 90 tablet 3  . enalapril (VASOTEC) 10 MG tablet Take 10 mg by mouth daily.  11  . famotidine (PEPCID) 40 MG tablet TAKE 1 TABLET BY MOUTH EVERY DAY 90 tablet 3  . ferrous sulfate (FERROUSUL) 325 (65 FE) MG tablet Take 1 tablet (325 mg total) by mouth daily with breakfast. 61 tablet 0  . furosemide (LASIX) 40 MG tablet Take 1 tablet (40 mg total) by mouth daily. 30 tablet 0  . ipratropium-albuterol (DUONEB) 0.5-2.5 (3) MG/3ML SOLN Take 3 mLs by nebulization every 6 (six) hours as needed (wheezing; shortness of breath).    Marland Kitchen JANUVIA 100 MG tablet TAKE 1 TABLET (100 MG TOTAL) BY MOUTH DAILY. 90 tablet 1  . levETIRAcetam (KEPPRA) 500 MG tablet Take 500 mg by mouth 2 (two) times daily.    . metFORMIN (GLUCOPHAGE) 1000 MG tablet TAKE 1 TABLET (1,000 MG TOTAL) BY MOUTH 2 (TWO) TIMES DAILY WITH A MEAL. 180 tablet 1  . metoprolol succinate (TOPROL-XL) 100 MG 24 hr tablet Take by mouth.    . nitroGLYCERIN (NITROSTAT) 0.4 MG SL tablet Place 0.4 mg under the tongue every 5 (five) minutes as needed for chest pain.    . ONE TOUCH ULTRA TEST test strip USE TO TEST BLOOD SUGAR ONCE DAILY 100 each 3  . pantoprazole (PROTONIX) 40 MG tablet Take 1 tablet (40 mg total) by mouth every morning. 90 tablet 1  . spironolactone (ALDACTONE) 25 MG tablet Take 1 tablet (25 mg total) by mouth daily. 30 tablet 0  . sucralfate (CARAFATE) 1 g tablet Take 1 g by mouth 4 (four) times daily -  with meals and at bedtime.      No current facility-administered medications on file prior to visit.    There are no Patient Instructions on file for this visit. No follow-ups on file.  Claire Bridge A Leelynd Maldonado, PA-C

## 2017-09-17 ENCOUNTER — Ambulatory Visit (INDEPENDENT_AMBULATORY_CARE_PROVIDER_SITE_OTHER): Payer: Medicare Other | Admitting: Gastroenterology

## 2017-09-17 ENCOUNTER — Encounter: Payer: Self-pay | Admitting: Gastroenterology

## 2017-09-17 VITALS — BP 105/56 | HR 82 | Ht 64.0 in | Wt 147.4 lb

## 2017-09-17 DIAGNOSIS — D509 Iron deficiency anemia, unspecified: Secondary | ICD-10-CM

## 2017-09-17 DIAGNOSIS — R188 Other ascites: Secondary | ICD-10-CM | POA: Diagnosis not present

## 2017-09-17 NOTE — Progress Notes (Signed)
[name] MD, MRCP(U.K) [address]  [place]  [place], [place] [address]  Main: [phone]  Fax: [phone]   Primary Care Physician: Idelle Crouch, MD  Primary Gastroenterologist:  Dr. [name]   No chief complaint on file.   HPI: John Frank is a 62 y.o. male   Summary of history : He was admitted in 08/2017 for CHF , EF 30% , right pleural effusion and ascites. Treated with diuresis , was noted to have ascites and a tap after giving him antibiotics showed high white cell count. GI was not consulted. CT abdomen showed ascites, possible early cirrhosis, right pleural effusion. Ascites fluid culture was negative . Ascitic fluid wcc 664 with 16% neutrophils. SAAG 1.6 which indicates possible portal hypertension.He has been seen previously for constipation.   08/03/17- Iron studies indicate possible deficiency. Normal B12, folate.H pylori stool antigen negative.   EGD in 2016 - normal. Ct abdomen in 01/2017 sowed right inguinal hernia. Labs 05/2017 : Hb 10.9 and MCV 20NOBSJ metabolic panel-normal. HIV negative . H/o GERD,COPD   Interval history3/25/2019 -08/26/17   Hep A ab,Hep C ab  -negative . Immune to hep B Inr 1.14. Cr 0.96 .  Weight has been stable. Feels better, no shortness of breath . Legs swelling has resolved.   Seen by Dr Clayborn Bigness in 08/2017 his cardiologist and has scheduled follow up in 6 months  He is on aldactone and lasix. No canned foods, no frozen meals . Occasional  roast beef sandwiches . Uses Dash .    BP (!) 105/56 (BP Location: Right Arm, Patient Position: Sitting, Cuff Size: Normal)   Pulse 82   Ht 5\' 4"  (1.626 m)   Wt 147 lb 6.4 oz (66.9 kg)   BMI 25.30 kg/m       Current Outpatient Medications  Medication Sig Dispense Refill  . albuterol (PROVENTIL HFA;VENTOLIN HFA) 108 (90 Base) MCG/ACT inhaler Inhale 2 puffs into the lungs every 6 (six) hours as needed for wheezing or shortness of breath. 1 Inhaler 2  . aspirin EC  81 MG tablet Take 81 mg by mouth daily.    Marland Kitchen atorvastatin (LIPITOR) 40 MG tablet TAKE 1 TABLET (40 MG TOTAL) BY MOUTH DAILY. 90 tablet 1  . azithromycin (ZITHROMAX) 500 MG tablet Take 500 mg by mouth daily.  0  . budesonide-formoterol (SYMBICORT) 160-4.5 MCG/ACT inhaler Inhale 2 puffs into the lungs 2 (two) times daily. (Patient taking differently: Inhale 2 puffs into the lungs daily. ) 1 Inhaler 3  . buPROPion (WELLBUTRIN) 75 MG tablet Take 75 mg by mouth daily.    . clopidogrel (PLAVIX) 75 MG tablet TAKE 1 TABLET (75 MG TOTAL) BY MOUTH DAILY. 90 tablet 3  . enalapril (VASOTEC) 10 MG tablet Take 10 mg by mouth daily.  11  . enalapril (VASOTEC) 5 MG tablet Take 5 mg by mouth daily.  1  . famotidine (PEPCID) 40 MG tablet TAKE 1 TABLET BY MOUTH EVERY DAY 90 tablet 3  . ferrous sulfate (FERROUSUL) 325 (65 FE) MG tablet Take 1 tablet (325 mg total) by mouth daily with breakfast. 61 tablet 0  . furosemide (LASIX) 40 MG tablet Take 1 tablet (40 mg total) by mouth daily. 30 tablet 0  . ipratropium-albuterol (DUONEB) 0.5-2.5 (3) MG/3ML SOLN Take 3 mLs by nebulization every 6 (six) hours as needed (wheezing; shortness of breath).    Marland Kitchen JANUVIA 100 MG tablet TAKE 1 TABLET (100 MG TOTAL) BY MOUTH DAILY. 90 tablet 1  .  levETIRAcetam (KEPPRA) 500 MG tablet Take 500 mg by mouth 2 (two) times daily.    . metFORMIN (GLUCOPHAGE) 1000 MG tablet TAKE 1 TABLET (1,000 MG TOTAL) BY MOUTH 2 (TWO) TIMES DAILY WITH A MEAL. 180 tablet 1  . metoprolol succinate (TOPROL-XL) 100 MG 24 hr tablet Take by mouth.    . nitroGLYCERIN (NITROSTAT) 0.4 MG SL tablet Place 0.4 mg under the tongue every 5 (five) minutes as needed for chest pain.    . ONE TOUCH ULTRA TEST test strip USE TO TEST BLOOD SUGAR ONCE DAILY 100 each 3  . pantoprazole (PROTONIX) 40 MG tablet Take 1 tablet (40 mg total) by mouth every morning. 90 tablet 1  . spironolactone (ALDACTONE) 25 MG tablet Take 1 tablet (25 mg total) by mouth daily. 30 tablet 0  .  sucralfate (CARAFATE) 1 g tablet Take 1 g by mouth 4 (four) times daily -  with meals and at bedtime.      No current facility-administered medications for this visit.     Allergies as of 09/17/2017  . (No Known Allergies)    ROS:  General: Negative for anorexia, weight loss, fever, chills, fatigue, weakness. ENT: Negative for hoarseness, difficulty swallowing , nasal congestion. CV: Negative for chest pain, angina, palpitations, dyspnea on exertion, peripheral edema.  Respiratory: Negative for dyspnea at rest, dyspnea on exertion, cough, sputum, wheezing.  GI: See history of present illness. GU:  Negative for dysuria, hematuria, urinary incontinence, urinary frequency, nocturnal urination.  Endo: Negative for unusual weight change.    Physical Examination:   There were no vitals taken for this visit.  General: Well-nourished, well-developed in no acute distress.  Eyes: No icterus. Conjunctivae pink. Mouth: Oropharyngeal mucosa moist and pink , no lesions erythema or exudate. Lungs: Clear to auscultation bilaterally. Non-labored. Heart: Regular rate and rhythm, no murmurs rubs or gallops.  Abdomen: Bowel sounds are normal, nontender, nondistended, no hepatosplenomegaly or masses, no abdominal bruits or hernia , no rebound or guarding.   Extremities:bruising , no edema  Neuro: Alert and oriented x 3.  Grossly intact. Skin: Warm and dry, no jaundice.   Psych: Alert and cooperative, normal mood and affect.   Imaging Studies: No results found.  Assessment and Plan:   John Frank is a 62 y.o. y/o malehere to follow recent hospitalization for  respiratory failure, CHF, ascites and pleural effusion . Hard to determine but likely may have a combination of CHF with an element of possible cirrhosis based on imaging . No evidence of portal hypertension biochemical or radiologically. SAAG is > 1.1 which can indicate ascites from portal hypertension. Presently on diuretics started by  Cardiology for CHF. Clinically heart failure has been well controlled and euvolemic fluid status.    Plan  1. Check CBC, on oral iron , check LFT 2.  low sodium diet  3. EGD with cardiac clearance to hold plavix to screen for esophageal varices.  4. Hep A vaccine with Idelle Crouch, MD   I have discussed alternative options, risks & benefits,  which include, but are not limited to, bleeding, infection, perforation,respiratory complication & drug reaction.  The patient agrees with this plan & written consent will be obtained.    Dr [name]  MD,MRCP [facility]) Follow up in 4 months

## 2017-09-18 ENCOUNTER — Telehealth: Payer: Self-pay

## 2017-09-18 ENCOUNTER — Other Ambulatory Visit: Payer: Self-pay

## 2017-09-18 ENCOUNTER — Other Ambulatory Visit
Admission: RE | Admit: 2017-09-18 | Discharge: 2017-09-18 | Disposition: A | Discharge status: Home / Self Care | Payer: Medicare Other | Source: Ambulatory Visit | Attending: Gastroenterology | Admitting: Gastroenterology

## 2017-09-18 DIAGNOSIS — D509 Iron deficiency anemia, unspecified: Secondary | ICD-10-CM

## 2017-09-18 DIAGNOSIS — J90 Pleural effusion, not elsewhere classified: Secondary | ICD-10-CM | POA: Diagnosis not present

## 2017-09-18 DIAGNOSIS — I509 Heart failure, unspecified: Secondary | ICD-10-CM | POA: Insufficient documentation

## 2017-09-18 DIAGNOSIS — R188 Other ascites: Secondary | ICD-10-CM | POA: Insufficient documentation

## 2017-09-18 LAB — CBC WITH DIFFERENTIAL/PLATELET
Basophils Absolute: 0.1 10*3/uL (ref 0–0.1)
Basophils Relative: 1 %
Eosinophils Absolute: 0.1 10*3/uL (ref 0–0.7)
Eosinophils Relative: 2 %
HCT: 37.1 % — ABNORMAL LOW (ref 40.0–52.0)
Hemoglobin: 11.8 g/dL — ABNORMAL LOW (ref 13.0–18.0)
LYMPHS ABS: 1.5 10*3/uL (ref 1.0–3.6)
Lymphocytes Relative: 21 %
MCH: 26.4 pg (ref 26.0–34.0)
MCHC: 31.8 g/dL — AB (ref 32.0–36.0)
MCV: 83.1 fL (ref 80.0–100.0)
MONO ABS: 0.6 10*3/uL (ref 0.2–1.0)
MONOS PCT: 8 %
NEUTROS ABS: 5.1 10*3/uL (ref 1.4–6.5)
Neutrophils Relative %: 68 %
Platelets: 230 10*3/uL (ref 150–440)
RBC: 4.47 MIL/uL (ref 4.40–5.90)
RDW: 20.5 % — ABNORMAL HIGH (ref 11.5–14.5)
WBC: 7.4 10*3/uL (ref 3.8–10.6)

## 2017-09-18 LAB — COMPREHENSIVE METABOLIC PANEL
ALK PHOS: 97 U/L (ref 38–126)
ALT: 15 U/L — ABNORMAL LOW (ref 17–63)
AST: 23 U/L (ref 15–41)
Albumin: 4.3 g/dL (ref 3.5–5.0)
Anion gap: 10 (ref 5–15)
BUN: 20 mg/dL (ref 6–20)
CALCIUM: 9.7 mg/dL (ref 8.9–10.3)
CHLORIDE: 97 mmol/L — AB (ref 101–111)
CO2: 30 mmol/L (ref 22–32)
Creatinine, Ser: 1.13 mg/dL (ref 0.61–1.24)
Glucose, Bld: 120 mg/dL — ABNORMAL HIGH (ref 65–99)
Potassium: 4.3 mmol/L (ref 3.5–5.1)
SODIUM: 137 mmol/L (ref 135–145)
Total Bilirubin: 0.9 mg/dL (ref 0.3–1.2)
Total Protein: 7.6 g/dL (ref 6.5–8.1)

## 2017-09-18 NOTE — Telephone Encounter (Signed)
Received PLAVIX holding instructions.  Scanned to chart.  Advised patient to hold PLAVIX: Start: 5/11 Restart: 5/20

## 2017-09-21 ENCOUNTER — Telehealth: Payer: Self-pay | Admitting: Gastroenterology

## 2017-09-21 ENCOUNTER — Telehealth: Payer: Self-pay

## 2017-09-21 NOTE — Telephone Encounter (Signed)
Pt is calling for [place] for results

## 2017-09-21 NOTE — Telephone Encounter (Signed)
LVM for patient callback for results per Dr. Vicente Males.    - Inform Hb improved- continue on oral iron - recheck CBC in 8-12 weeks

## 2017-09-23 ENCOUNTER — Encounter: Payer: Self-pay | Admitting: *Deleted

## 2017-09-23 LAB — ACID FAST CULTURE WITH REFLEXED SENSITIVITIES (MYCOBACTERIA): Acid Fast Culture: NEGATIVE

## 2017-09-23 NOTE — Telephone Encounter (Signed)
Advised patient of lab results per Dr. Vicente Males.  Inform Hb improved- continue on oral iron - recheck CBC in 8-12 weeks

## 2017-09-24 ENCOUNTER — Encounter
Admission: RE | Disposition: A | Discharge status: Home / Self Care | Payer: Self-pay | Source: Ambulatory Visit | Attending: Gastroenterology

## 2017-09-24 ENCOUNTER — Encounter: Payer: Self-pay | Admitting: Anesthesiology

## 2017-09-24 ENCOUNTER — Telehealth: Payer: Self-pay | Admitting: Gastroenterology

## 2017-09-24 ENCOUNTER — Ambulatory Visit
Admission: RE | Admit: 2017-09-24 | Discharge: 2017-09-24 | Disposition: A | Discharge status: Home / Self Care | Payer: Medicare Other | Source: Ambulatory Visit | Attending: Gastroenterology | Admitting: Gastroenterology

## 2017-09-24 SURGERY — ESOPHAGOGASTRODUODENOSCOPY (EGD) WITH PROPOFOL
Anesthesia: General

## 2017-09-24 NOTE — Telephone Encounter (Signed)
Returned patient's call.  Patient needs to reschedule due to poor prep.   LVM for callback.

## 2017-09-24 NOTE — Telephone Encounter (Signed)
Hospital called pt was unable to have procedure he drank coffee with cream  They had him rescheduled to noon and then he did not have a driver please call pt to reschedule procedure

## 2017-09-24 NOTE — H&P (Signed)
Vonda Antigua, MD [address] [place], [place], [place], [address] [address], [place], [place], [place], [address] Phone: [phone]  Fax: [phone]  Primary Care Physician:  Idelle Crouch, MD   Pre-Procedure History & Physical: HPI:  John Frank is a 62 y.o. male is here for an EGD.   Past Medical History:  Diagnosis Date  . CAD (coronary artery disease)    s/p stents in 2017  . CHF (congestive heart failure) ([place])   . COPD (chronic obstructive pulmonary disease) ([place])   . Diabetes ([place])   . GERD (gastroesophageal reflux disease)   . Heart attack ([place])   . Hyperlipidemia   . Hypertension   . Presence of permanent cardiac pacemaker   . Shortness of breath dyspnea   . Sleep apnea     Past Surgical History:  Procedure Laterality Date  . cardiac stents    . CARDIAC SURGERY     defibrillator and pacemaker  . CORONARY ANGIOPLASTY    . ESOPHAGOGASTRODUODENOSCOPY N/A 09/19/2014   Procedure: ESOPHAGOGASTRODUODENOSCOPY (EGD);  Surgeon: Lucilla Lame, MD;  Location: [facility] ENDOSCOPY;  Service: Endoscopy;  Laterality: N/A;    Prior to Admission medications   Medication Sig Start Date End Date Taking? Authorizing Provider  albuterol (PROVENTIL HFA;VENTOLIN HFA) 108 (90 Base) MCG/ACT inhaler Inhale 2 puffs into the lungs every 6 (six) hours as needed for wheezing or shortness of breath. 05/26/17   Henreitta Leber, MD  aspirin EC 81 MG tablet Take 81 mg by mouth daily.    [provider]  atorvastatin (LIPITOR) 40 MG tablet TAKE 1 TABLET (40 MG TOTAL) BY MOUTH DAILY. 09/14/17   Kathrine Haddock, NP  azithromycin (ZITHROMAX) 500 MG tablet Take 500 mg by mouth daily. 09/03/17   [provider]  budesonide-formoterol (SYMBICORT) 160-4.5 MCG/ACT inhaler Inhale 2 puffs into the lungs 2 (two) times daily. Patient taking differently: Inhale 2 puffs into the lungs daily.  06/15/15   Kathrine Haddock, NP  buPROPion (WELLBUTRIN) 75 MG tablet Take 75 mg by mouth  daily. 03/26/16 05/08/18  [provider]  clopidogrel (PLAVIX) 75 MG tablet TAKE 1 TABLET (75 MG TOTAL) BY MOUTH DAILY. 12/08/16   Kathrine Haddock, NP  enalapril (VASOTEC) 10 MG tablet Take 10 mg by mouth daily. 08/19/17   [provider]  enalapril (VASOTEC) 5 MG tablet Take 5 mg by mouth daily. 09/09/17   [provider]  famotidine (PEPCID) 40 MG tablet TAKE 1 TABLET BY MOUTH EVERY DAY 09/09/17   Kathrine Haddock, NP  ferrous sulfate (FERROUSUL) 325 (65 FE) MG tablet Take 1 tablet (325 mg total) by mouth daily with breakfast. 08/26/17 10/26/17  [name], MD  furosemide (LASIX) 40 MG tablet Take 1 tablet (40 mg total) by mouth daily. 08/11/17   Loletha Grayer, MD  ipratropium-albuterol (DUONEB) 0.5-2.5 (3) MG/3ML SOLN Take 3 mLs by nebulization every 6 (six) hours as needed (wheezing; shortness of breath).    [provider]  JANUVIA 100 MG tablet TAKE 1 TABLET (100 MG TOTAL) BY MOUTH DAILY. 09/14/17   Kathrine Haddock, NP  levETIRAcetam (KEPPRA) 500 MG tablet Take 500 mg by mouth 2 (two) times daily.    [provider]  metFORMIN (GLUCOPHAGE) 1000 MG tablet TAKE 1 TABLET (1,000 MG TOTAL) BY MOUTH 2 (TWO) TIMES DAILY WITH A MEAL. 09/12/16   Kathrine Haddock, NP  metoprolol succinate (TOPROL-XL) 100 MG 24 hr tablet Take by mouth. 08/19/17   [provider]  nitroGLYCERIN (NITROSTAT) 0.4  MG SL tablet Place 0.4 mg under the tongue every 5 (five) minutes as needed for chest pain.    [provider]  ONE TOUCH ULTRA TEST test strip USE TO TEST BLOOD SUGAR ONCE DAILY 10/07/16   Wynetta Emery, Megan P, DO  pantoprazole (PROTONIX) 40 MG tablet Take 1 tablet (40 mg total) by mouth every morning. 06/29/17   [name], MD  spironolactone (ALDACTONE) 25 MG tablet Take 1 tablet (25 mg total) by mouth daily. 08/12/17   Loletha Grayer, MD  sucralfate (CARAFATE) 1 g tablet Take 1 g by mouth 4 (four) times daily -  with meals and at bedtime.  05/15/17 05/15/18  [provider]    Allergies as of 09/18/2017  . (No Known Allergies)    Family History  Problem Relation Age of Onset  . Diabetes Mother   . Heart attack Father 41    Social History   Socioeconomic History  . Marital status: Single    Spouse name: Not on file  . Number of children: Not on file  . Years of education: Not on file  . Highest education level: Not on file  Occupational History  . Not on file  Social Needs  . Financial resource strain: Not on file  . Food insecurity:    Worry: Not on file    Inability: Not on file  . Transportation needs:    Medical: Not on file    Non-medical: Not on file  Tobacco Use  . Smoking status: Current Every Day Smoker    Packs/day: 1.00    Years: 40.00    Pack years: 40.00    Types: Cigarettes  . Smokeless tobacco: Former Network engineer and Sexual Activity  . Alcohol use: No  . Drug use: No  . Sexual activity: Yes  Lifestyle  . Physical activity:    Days per week: Not on file    Minutes per session: Not on file  . Stress: Not on file  Relationships  . Social connections:    Talks on phone: Not on file    Gets together: Not on file    Attends religious service: Not on file    Active member of club or organization: Not on file    Attends meetings of clubs or organizations: Not on file    Relationship status: Not on file  . Intimate partner violence:    Fear of current or ex partner: Not on file    Emotionally abused: Not on file    Physically abused: Not on file    Forced sexual activity: Not on file  Other Topics Concern  . Not on file  Social History Narrative   Independent at baseline.  Lives at home with his family    Review of Systems: See HPI, otherwise negative ROS  Physical Exam: There were no vitals taken for this visit. General:   Alert,  pleasant and cooperative in NAD Head:  Normocephalic and atraumatic. Neck:  Supple; no masses or thyromegaly. Lungs:  Clear throughout to auscultation, normal  respiratory effort.    Heart:  +S1, +S2, Regular rate and rhythm, No edema. Abdomen:  Soft, nontender and nondistended. Normal bowel sounds, without guarding, and without rebound.   Neurologic:  Alert and  oriented x4;  grossly normal neurologically.  Impression/Plan: John Frank is a patient of Dr. Vicente Males, here for an EGD for Variceal Screening.   Risks, benefits, limitations, and alternatives regarding the procedure have been reviewed with the patient.  Questions have been answered.  All parties agreeable.   Virgel Manifold, MD  09/24/2017, 11:13 AM

## 2017-09-25 ENCOUNTER — Other Ambulatory Visit: Payer: Self-pay

## 2017-09-25 DIAGNOSIS — D509 Iron deficiency anemia, unspecified: Secondary | ICD-10-CM

## 2017-09-28 ENCOUNTER — Encounter: Payer: Self-pay | Admitting: *Deleted

## 2017-09-28 ENCOUNTER — Ambulatory Visit: Payer: Medicare Other | Admitting: Registered Nurse

## 2017-09-28 ENCOUNTER — Ambulatory Visit
Admission: RE | Admit: 2017-09-28 | Discharge: 2017-09-28 | Disposition: A | Discharge status: Home / Self Care | Payer: Medicare Other | Source: Ambulatory Visit | Attending: Gastroenterology | Admitting: Gastroenterology

## 2017-09-28 ENCOUNTER — Encounter
Admission: RE | Disposition: A | Discharge status: Home / Self Care | Payer: Self-pay | Source: Ambulatory Visit | Attending: Gastroenterology

## 2017-09-28 DIAGNOSIS — Z950 Presence of cardiac pacemaker: Secondary | ICD-10-CM | POA: Diagnosis not present

## 2017-09-28 DIAGNOSIS — Z79899 Other long term (current) drug therapy: Secondary | ICD-10-CM | POA: Insufficient documentation

## 2017-09-28 DIAGNOSIS — G4730 Sleep apnea, unspecified: Secondary | ICD-10-CM | POA: Insufficient documentation

## 2017-09-28 DIAGNOSIS — I110 Hypertensive heart disease with heart failure: Secondary | ICD-10-CM | POA: Insufficient documentation

## 2017-09-28 DIAGNOSIS — I252 Old myocardial infarction: Secondary | ICD-10-CM | POA: Diagnosis not present

## 2017-09-28 DIAGNOSIS — Z7984 Long term (current) use of oral hypoglycemic drugs: Secondary | ICD-10-CM | POA: Diagnosis not present

## 2017-09-28 DIAGNOSIS — K3189 Other diseases of stomach and duodenum: Secondary | ICD-10-CM | POA: Diagnosis not present

## 2017-09-28 DIAGNOSIS — D509 Iron deficiency anemia, unspecified: Secondary | ICD-10-CM | POA: Diagnosis not present

## 2017-09-28 DIAGNOSIS — F17210 Nicotine dependence, cigarettes, uncomplicated: Secondary | ICD-10-CM | POA: Insufficient documentation

## 2017-09-28 DIAGNOSIS — J449 Chronic obstructive pulmonary disease, unspecified: Secondary | ICD-10-CM | POA: Insufficient documentation

## 2017-09-28 DIAGNOSIS — R188 Other ascites: Secondary | ICD-10-CM

## 2017-09-28 DIAGNOSIS — I2510 Atherosclerotic heart disease of native coronary artery without angina pectoris: Secondary | ICD-10-CM | POA: Diagnosis not present

## 2017-09-28 DIAGNOSIS — Z7951 Long term (current) use of inhaled steroids: Secondary | ICD-10-CM | POA: Insufficient documentation

## 2017-09-28 DIAGNOSIS — Z7902 Long term (current) use of antithrombotics/antiplatelets: Secondary | ICD-10-CM | POA: Insufficient documentation

## 2017-09-28 DIAGNOSIS — I509 Heart failure, unspecified: Secondary | ICD-10-CM | POA: Insufficient documentation

## 2017-09-28 DIAGNOSIS — K2940 Chronic atrophic gastritis without bleeding: Secondary | ICD-10-CM | POA: Diagnosis not present

## 2017-09-28 DIAGNOSIS — K7460 Unspecified cirrhosis of liver: Secondary | ICD-10-CM | POA: Diagnosis not present

## 2017-09-28 DIAGNOSIS — K219 Gastro-esophageal reflux disease without esophagitis: Secondary | ICD-10-CM | POA: Insufficient documentation

## 2017-09-28 DIAGNOSIS — Z955 Presence of coronary angioplasty implant and graft: Secondary | ICD-10-CM | POA: Insufficient documentation

## 2017-09-28 DIAGNOSIS — E785 Hyperlipidemia, unspecified: Secondary | ICD-10-CM | POA: Diagnosis not present

## 2017-09-28 HISTORY — PX: ESOPHAGOGASTRODUODENOSCOPY (EGD) WITH PROPOFOL: SHX5813

## 2017-09-28 LAB — GLUCOSE, CAPILLARY: Glucose-Capillary: 129 mg/dL — ABNORMAL HIGH (ref 65–99)

## 2017-09-28 SURGERY — ESOPHAGOGASTRODUODENOSCOPY (EGD) WITH PROPOFOL
Anesthesia: General

## 2017-09-28 MED ORDER — LIDOCAINE HCL (PF) 2 % IJ SOLN
INTRAMUSCULAR | Status: AC
  Filled 2017-09-28: qty 10

## 2017-09-28 MED ORDER — PROPOFOL 500 MG/50ML IV EMUL
INTRAVENOUS | Status: AC
  Filled 2017-09-28: qty 50

## 2017-09-28 MED ORDER — PROPOFOL 10 MG/ML IV BOLUS
INTRAVENOUS | Status: DC | PRN
  Administered 2017-09-28: 60 mg via INTRAVENOUS

## 2017-09-28 MED ORDER — LIDOCAINE HCL (PF) 1 % IJ SOLN
2.0000 mL | Freq: Once | INTRAMUSCULAR | Status: AC
Start: 2017-09-28 — End: 2017-09-28
  Administered 2017-09-28: 0.3 mL via INTRADERMAL

## 2017-09-28 MED ORDER — PHENYLEPHRINE HCL 10 MG/ML IJ SOLN
INTRAMUSCULAR | Status: DC | PRN
  Administered 2017-09-28: 100 ug via INTRAVENOUS

## 2017-09-28 MED ORDER — SODIUM CHLORIDE 0.9 % IV SOLN
INTRAVENOUS | Status: DC
  Administered 2017-09-28: 1000 mL via INTRAVENOUS

## 2017-09-28 MED ORDER — GLYCOPYRROLATE 0.2 MG/ML IJ SOLN
INTRAMUSCULAR | Status: AC
  Filled 2017-09-28: qty 1

## 2017-09-28 MED ORDER — FENTANYL CITRATE (PF) 100 MCG/2ML IJ SOLN
INTRAMUSCULAR | Status: DC | PRN
  Administered 2017-09-28: 25 ug via INTRAVENOUS

## 2017-09-28 MED ORDER — FENTANYL CITRATE (PF) 100 MCG/2ML IJ SOLN
INTRAMUSCULAR | Status: AC
  Filled 2017-09-28: qty 2

## 2017-09-28 MED ORDER — LIDOCAINE HCL (PF) 1 % IJ SOLN
INTRAMUSCULAR | Status: AC
  Administered 2017-09-28: 0.3 mL via INTRADERMAL
  Filled 2017-09-28: qty 2

## 2017-09-28 MED ORDER — GLYCOPYRROLATE 0.2 MG/ML IJ SOLN
INTRAMUSCULAR | Status: DC | PRN
  Administered 2017-09-28: .1 mg via INTRAVENOUS

## 2017-09-28 MED ORDER — PROPOFOL 500 MG/50ML IV EMUL
INTRAVENOUS | Status: DC | PRN
  Administered 2017-09-28: 140 ug/kg/min via INTRAVENOUS

## 2017-09-28 MED ORDER — LIDOCAINE HCL (CARDIAC) PF 100 MG/5ML IV SOSY
PREFILLED_SYRINGE | INTRAVENOUS | Status: DC | PRN
  Administered 2017-09-28: 60 mg via INTRAVENOUS

## 2017-09-28 NOTE — Op Note (Signed)
[facility] Gastroenterology Patient Name: John Frank Procedure Date: 09/28/2017 9:00 AM MRN: [id number] Account #: [id number] Date of Birth: January 20, 1956 Admit Type: Outpatient Age: 62 Room: [facility] ENDO ROOM 4 Gender: Male Note Status: Finalized Procedure:            Upper GI endoscopy Indications:          Iron deficiency anemia, Cirrhosis rule out esophageal                        varices Providers:            Varnita B. Bonna Gains MD, MD Referring MD:         Leonie Douglas. Doy Hutching, MD (Referring MD) Medicines:            Monitored Anesthesia Care Complications:        No immediate complications. Procedure:            Pre-Anesthesia Assessment:                       - Prior to the procedure, a History and Physical was                        performed, and patient medications, allergies and                        sensitivities were reviewed. The patient's tolerance of                        previous anesthesia was reviewed.                       - The risks and benefits of the procedure and the                        sedation options and risks were discussed with the                        patient. All questions were answered and informed                        consent was obtained.                       - Patient identification and proposed procedure were                        verified prior to the procedure by the physician, the                        nurse, the anesthesiologist, the anesthetist and the                        technician. The procedure was verified in the procedure                        room.                       - ASA Grade Assessment: III - A patient with severe  systemic disease.                       After obtaining informed consent, the endoscope was                        passed under direct vision. Throughout the procedure,                        the patient's blood pressure, pulse, and oxygen   saturations were monitored continuously. The Endoscope                        was introduced through the mouth, and advanced to the                        third part of duodenum. The upper GI endoscopy was                        accomplished with ease. The patient tolerated the                        procedure well. Findings:      The examined esophagus was normal.      There is no endoscopic evidence of varices in the entire esophagus.      Patchy atrophic mucosa was found in the entire examined stomach.       Biopsies were obtained in the gastric body, at the incisura and in the       gastric antrum with cold forceps for histology.      The duodenal bulb, second portion of the duodenum and examined duodenum       were normal. Biopsies for histology were taken with a cold forceps for       evaluation of celiac disease. Impression:           - Normal esophagus.                       - Gastric mucosal atrophy.                       - Normal duodenal bulb, second portion of the duodenum                        and examined duodenum. Biopsied.                       - Biopsies were obtained in the gastric body, at the                        incisura and in the gastric antrum. Recommendation:       - Await pathology results.                       - Discharge patient to home (with escort).                       - Advance diet as tolerated.                       - Continue present medications.                       -  Patient has a contact number available for                        emergencies. The signs and symptoms of potential                        delayed complications were discussed with the patient.                        Return to normal activities tomorrow. Written discharge                        instructions were provided to the patient.                       - Discharge patient to home (with escort).                       - The findings and recommendations were discussed with                         the patient.                       - The findings and recommendations were discussed with                        the patient's family.                       - Return to GI clinic as previously scheduled. Procedure Code(s):    --- Professional ---                       [id number], Esophagogastroduodenoscopy, flexible, transoral;                        with biopsy, single or multiple Diagnosis Code(s):    --- Professional ---                       K31.89, Other diseases of stomach and duodenum                       D50.9, Iron deficiency anemia, unspecified                       K74.60, Unspecified cirrhosis of liver CPT copyright 2017 American Medical Association. All rights reserved. The codes documented in this report are preliminary and upon coder review may  be revised to meet current compliance requirements.  Vonda Antigua, MD Margretta Sidle B. Bonna Gains MD, MD 09/28/2017 9:48:56 AM This report has been signed electronically. Number of Addenda: 0 Note Initiated On: 09/28/2017 9:00 AM Estimated Blood Loss: Estimated blood loss: none.      [facility]

## 2017-09-28 NOTE — Anesthesia Post-op Follow-up Note (Signed)
Anesthesia QCDR form completed.        

## 2017-09-28 NOTE — Anesthesia Postprocedure Evaluation (Signed)
Anesthesia Post Note  Patient: ARNOL MCGIBBON  Procedure(s) Performed: ESOPHAGOGASTRODUODENOSCOPY (EGD) WITH PROPOFOL (N/A )  Patient location during evaluation: Endoscopy Anesthesia Type: General Level of consciousness: awake and alert and oriented Pain management: pain level controlled Vital Signs Assessment: post-procedure vital signs reviewed and stable Respiratory status: spontaneous breathing, nonlabored ventilation and respiratory function stable Cardiovascular status: blood pressure returned to baseline and stable Postop Assessment: no signs of nausea or vomiting Anesthetic complications: no     Last Vitals:  Vitals:   09/28/17 1000 09/28/17 1010  BP: (!) 91/59 (!) 91/59  Pulse: 81 77  Resp: 17 18  Temp:    SpO2: 98% 98%    Last Pain:  Vitals:   09/28/17 0940  TempSrc: Tympanic  PainSc:                  Orrin Yurkovich

## 2017-09-28 NOTE — Anesthesia Preprocedure Evaluation (Signed)
Anesthesia Evaluation  Patient identified by MRN, date of birth, ID band Patient awake    Reviewed: Allergy & Precautions, NPO status , Patient's Chart, lab work & pertinent test results  History of Anesthesia Complications Negative for: history of anesthetic complications  Airway Mallampati: III  TM Distance: >3 FB Neck ROM: Full    Dental  (+) Edentulous Upper, Edentulous Lower   Pulmonary sleep apnea (does not wear CPAP because he says he doesn't know how to put the mask on) , COPD, Current Smoker,    breath sounds clear to auscultation- rhonchi (-) wheezing      Cardiovascular hypertension, + CAD, + Past MI, + Cardiac Stents and +CHF (EF 30%)  + pacemaker + Cardiac Defibrillator  Rhythm:Regular Rate:Normal - Systolic murmurs and - Diastolic murmurs Echo 50/1/58: MODERATE LV SYSTOLIC DYSFUNCTION WITH AN ESTIMATED EF = 30 % NORMAL RIGHT VENTRICULAR SYSTOLIC FUNCTION MILD TRICUSPID AND MITRAL VALVE INSUFFICIENCY TRACE AORTIC VALVE INSUFFICIENCY NO VALVULAR STENOSIS MILD LV ENLARGEMENT PACER WIRE NOTED   Neuro/Psych negative neurological ROS  negative psych ROS   GI/Hepatic Neg liver ROS, GERD  ,  Endo/Other  diabetes, Oral Hypoglycemic Agents  Renal/GU negative Renal ROS     Musculoskeletal negative musculoskeletal ROS (+)   Abdominal (+) - obese,   Peds  Hematology negative hematology ROS (+)   Anesthesia Other Findings Past Medical History: No date: CAD (coronary artery disease)     Comment:  s/p stents in 2017 No date: CHF (congestive heart failure) (HCC) No date: COPD (chronic obstructive pulmonary disease) (HCC) No date: Diabetes (HCC) No date: GERD (gastroesophageal reflux disease) No date: Heart attack ([place]) No date: Hyperlipidemia No date: Hypertension No date: Presence of permanent cardiac pacemaker No date: Shortness of breath dyspnea No date: Sleep apnea   Reproductive/Obstetrics                              Anesthesia Physical Anesthesia Plan  ASA: III  Anesthesia Plan: General   Post-op Pain Management:    Induction: Intravenous  PONV Risk Score and Plan: 0 and Propofol infusion  Airway Management Planned: Natural Airway  Additional Equipment:   Intra-op Plan:   Post-operative Plan:   Informed Consent: I have reviewed the patients History and Physical, chart, labs and discussed the procedure including the risks, benefits and alternatives for the proposed anesthesia with the patient or authorized representative who has indicated his/her understanding and acceptance.   Dental advisory given  Plan Discussed with: CRNA and Anesthesiologist  Anesthesia Plan Comments:         Anesthesia Quick Evaluation

## 2017-09-28 NOTE — H&P (Signed)
Vonda Antigua, MD [address] [place], [place], [place], [address] [id number], [place], [place], [place], [address] Phone: [phone]  Fax: [phone]  Primary Care Physician:  Idelle Crouch, MD   Pre-Procedure History & Physical: HPI:  John Frank is a 62 y.o. male is here for an EGD.   Past Medical History:  Diagnosis Date  . CAD (coronary artery disease)    s/p stents in 2017  . CHF (congestive heart failure) ([place])   . COPD (chronic obstructive pulmonary disease) ([place])   . Diabetes ([place])   . GERD (gastroesophageal reflux disease)   . Heart attack ([place])   . Hyperlipidemia   . Hypertension   . Presence of permanent cardiac pacemaker   . Shortness of breath dyspnea   . Sleep apnea     Past Surgical History:  Procedure Laterality Date  . cardiac stents    . CARDIAC SURGERY     defibrillator and pacemaker  . CORONARY ANGIOPLASTY    . ESOPHAGOGASTRODUODENOSCOPY N/A 09/19/2014   Procedure: ESOPHAGOGASTRODUODENOSCOPY (EGD);  Surgeon: Lucilla Lame, MD;  Location: [facility] ENDOSCOPY;  Service: Endoscopy;  Laterality: N/A;    Prior to Admission medications   Medication Sig Start Date End Date Taking? Authorizing Provider  albuterol (PROVENTIL HFA;VENTOLIN HFA) 108 (90 Base) MCG/ACT inhaler Inhale 2 puffs into the lungs every 6 (six) hours as needed for wheezing or shortness of breath. 05/26/17  Yes Henreitta Leber, MD  aspirin EC 81 MG tablet Take 81 mg by mouth daily.   Yes [provider]  atorvastatin (LIPITOR) 40 MG tablet TAKE 1 TABLET (40 MG TOTAL) BY MOUTH DAILY. 09/14/17  Yes Kathrine Haddock, NP  budesonide-formoterol (SYMBICORT) 160-4.5 MCG/ACT inhaler Inhale 2 puffs into the lungs 2 (two) times daily. Patient taking differently: Inhale 2 puffs into the lungs daily.  06/15/15  Yes Kathrine Haddock, NP  buPROPion (WELLBUTRIN) 75 MG tablet Take 75 mg by mouth daily. 03/26/16 05/08/18 Yes [provider]  clopidogrel (PLAVIX) 75 MG tablet TAKE  1 TABLET (75 MG TOTAL) BY MOUTH DAILY. 12/08/16  Yes Kathrine Haddock, NP  enalapril (VASOTEC) 10 MG tablet Take 10 mg by mouth daily. 08/19/17  Yes [provider]  famotidine (PEPCID) 40 MG tablet TAKE 1 TABLET BY MOUTH EVERY DAY 09/09/17  Yes Kathrine Haddock, NP  ferrous sulfate (FERROUSUL) 325 (65 FE) MG tablet Take 1 tablet (325 mg total) by mouth daily with breakfast. 08/26/17 10/26/17 Yes [name], MD  furosemide (LASIX) 40 MG tablet Take 1 tablet (40 mg total) by mouth daily. 08/11/17  Yes Wieting, Richard, MD  JANUVIA 100 MG tablet TAKE 1 TABLET (100 MG TOTAL) BY MOUTH DAILY. 09/14/17  Yes Kathrine Haddock, NP  levETIRAcetam (KEPPRA) 500 MG tablet Take 500 mg by mouth 2 (two) times daily.   Yes [provider]  metFORMIN (GLUCOPHAGE) 1000 MG tablet TAKE 1 TABLET (1,000 MG TOTAL) BY MOUTH 2 (TWO) TIMES DAILY WITH A MEAL. 09/12/16  Yes Kathrine Haddock, NP  metoprolol succinate (TOPROL-XL) 100 MG 24 hr tablet Take by mouth. 08/19/17  Yes [provider]  nitroGLYCERIN (NITROSTAT) 0.4 MG SL tablet Place 0.4 mg under the tongue every 5 (five) minutes as needed for chest pain.   Yes [provider]  ONE TOUCH ULTRA TEST test strip USE TO TEST BLOOD SUGAR ONCE DAILY 10/07/16  Yes Johnson, Megan P, DO  pantoprazole (PROTONIX) 40 MG tablet Take 1 tablet (40 mg total) by mouth every morning. 06/29/17  Yes  [name], MD  spironolactone (ALDACTONE) 25 MG tablet Take 1 tablet (25 mg total) by mouth daily. 08/12/17  Yes Wieting, Richard, MD  sucralfate (CARAFATE) 1 g tablet Take 1 g by mouth 4 (four) times daily -  with meals and at bedtime.  05/15/17 05/15/18 Yes [provider]  azithromycin (ZITHROMAX) 500 MG tablet Take 500 mg by mouth daily. 09/03/17   [provider]  enalapril (VASOTEC) 5 MG tablet Take 5 mg by mouth daily. 09/09/17   [provider]  ipratropium-albuterol (DUONEB) 0.5-2.5 (3) MG/3ML SOLN Take 3 mLs by nebulization every 6 (six) hours as needed  (wheezing; shortness of breath).    [provider]    Allergies as of 09/25/2017  . (No Known Allergies)    Family History  Problem Relation Age of Onset  . Diabetes Mother   . Heart attack Father 14    Social History   Socioeconomic History  . Marital status: Single    Spouse name: Not on file  . Number of children: Not on file  . Years of education: Not on file  . Highest education level: Not on file  Occupational History  . Not on file  Social Needs  . Financial resource strain: Not on file  . Food insecurity:    Worry: Not on file    Inability: Not on file  . Transportation needs:    Medical: Not on file    Non-medical: Not on file  Tobacco Use  . Smoking status: Current Every Day Smoker    Packs/day: 1.00    Years: 40.00    Pack years: 40.00    Types: Cigarettes  . Smokeless tobacco: Former Network engineer and Sexual Activity  . Alcohol use: No  . Drug use: Never  . Sexual activity: Yes  Lifestyle  . Physical activity:    Days per week: Not on file    Minutes per session: Not on file  . Stress: Not on file  Relationships  . Social connections:    Talks on phone: Not on file    Gets together: Not on file    Attends religious service: Not on file    Active member of club or organization: Not on file    Attends meetings of clubs or organizations: Not on file    Relationship status: Not on file  . Intimate partner violence:    Fear of current or ex partner: Not on file    Emotionally abused: Not on file    Physically abused: Not on file    Forced sexual activity: Not on file  Other Topics Concern  . Not on file  Social History Narrative   Independent at baseline.  Lives at home with his family    Review of Systems: See HPI, otherwise negative ROS  Physical Exam: BP 111/74   Pulse 77   Temp 97.9 F (36.6 C) (Tympanic)   Resp 20   Ht 5\' 4"  (1.626 m)   Wt 164 lb (74.4 kg)   SpO2 100%   BMI 28.15 kg/m  General:   Alert,  pleasant  and cooperative in NAD Head:  Normocephalic and atraumatic. Neck:  Supple; no masses or thyromegaly. Lungs:  Clear throughout to auscultation, normal respiratory effort.    Heart:  +S1, +S2, Regular rate and rhythm, No edema. Abdomen:  Soft, nontender and nondistended. Normal bowel sounds, without guarding, and without rebound.   Neurologic:  Alert and  oriented x4;  grossly normal neurologically.  Impression/Plan: John Frank is a patient of Dr. [name], and pt. here for Variceal Screening as ordered by Dr. Vicente Males.  Risks, benefits, limitations, and alternatives regarding the procedure have been reviewed with the patient.  Questions have been answered.  All parties agreeable.   Virgel Manifold, MD  09/28/2017, 9:01 AM

## 2017-09-28 NOTE — Transfer of Care (Signed)
Immediate Anesthesia Transfer of Care Note  Patient: John Frank  Procedure(s) Performed: ESOPHAGOGASTRODUODENOSCOPY (EGD) WITH PROPOFOL (N/A )  Patient Location: PACU  Anesthesia Type:General  Level of Consciousness: awake, alert  and oriented  Airway & Oxygen Therapy: Patient Spontanous Breathing and Patient connected to nasal cannula oxygen  Post-op Assessment: Report given to RN and Post -op Vital signs reviewed and stable  Post vital signs: Reviewed and stable  Last Vitals:  Vitals Value Taken Time  BP 98/62 09/28/2017  9:45 AM  Temp 36.3 C 09/28/2017  9:45 AM  Pulse 80 09/28/2017  9:45 AM  Resp 13 09/28/2017  9:45 AM  SpO2 95 % 09/28/2017  9:45 AM    Last Pain:  Vitals:   09/28/17 0940  TempSrc: Tympanic  PainSc:          Complications: No apparent anesthesia complications

## 2017-09-29 ENCOUNTER — Encounter: Payer: Self-pay | Admitting: Gastroenterology

## 2017-09-29 ENCOUNTER — Ambulatory Visit: Payer: Medicare Other | Admitting: Family

## 2017-10-05 NOTE — Progress Notes (Signed)
Patient ID: John Frank, male    DOB: [birth year], 62 y.o.   MRN: [id number]  HPI  Mr Lemelle is a 62 y/o male with a history of CAD, DM, hyperlipidemia, HTN, GERD, COPD, obstructive sleep apnea, chronic heart failure and current tobacco use.   Echo report from 04/13/17 reviewed and showed an EF of 30% along with mild MR/TR.  Admitted 08/09/17 due to acute on chronic HF. Initially needed IV Lasix and then transitioned to oral diuretics. Paracentesis was done due to ascites and antibiotics were also given. Discharged after 2 days.   He presents today for a follow-up visit with a chief complaint of minimal shortness of breath upon moderate exertion. He says that this has been present for several years with varying levels of severity. He has associated cough along with this. He denies any difficulty sleeping, abdominal distention, palpitations, pedal edema, chest pain, fatigue, dizziness or weight gain. He's not completely sure what medications he is taking. He stopped taking his [place] after reading potential side effects related to his heart.   Past Medical History:  Diagnosis Date  . CAD (coronary artery disease)    s/p stents in 2017  . CHF (congestive heart failure) ([place])   . COPD (chronic obstructive pulmonary disease) ([place])   . Diabetes ([place])   . GERD (gastroesophageal reflux disease)   . Heart attack ([place])   . Hyperlipidemia   . Hypertension   . Presence of permanent cardiac pacemaker   . Shortness of breath dyspnea   . Sleep apnea    Past Surgical History:  Procedure Laterality Date  . cardiac stents    . CARDIAC SURGERY     defibrillator and pacemaker  . CORONARY ANGIOPLASTY    . ESOPHAGOGASTRODUODENOSCOPY N/A 09/19/2014   Procedure: ESOPHAGOGASTRODUODENOSCOPY (EGD);  Surgeon: Lucilla Lame, MD;  Location: [facility] ENDOSCOPY;  Service: Endoscopy;  Laterality: N/A;  . ESOPHAGOGASTRODUODENOSCOPY (EGD) WITH PROPOFOL N/A 09/28/2017   Procedure: ESOPHAGOGASTRODUODENOSCOPY (EGD) WITH  PROPOFOL;  Surgeon: Virgel Manifold, MD;  Location: [facility] ENDOSCOPY;  Service: Endoscopy;  Laterality: N/A;   Family History  Problem Relation Age of Onset  . Diabetes Mother   . Heart attack Father 65   Social History   Tobacco Use  . Smoking status: Current Every Day Smoker    Packs/day: 1.00    Years: 40.00    Pack years: 40.00    Types: Cigarettes  . Smokeless tobacco: Former Network engineer Use Topics  . Alcohol use: No   No Known Allergies  Prior to Admission medications   Medication Sig Start Date End Date Taking? Authorizing Provider  albuterol (PROVENTIL HFA;VENTOLIN HFA) 108 (90 Base) MCG/ACT inhaler Inhale 2 puffs into the lungs every 6 (six) hours as needed for wheezing or shortness of breath. 05/26/17  Yes Henreitta Leber, MD  aspirin EC 81 MG tablet Take 81 mg by mouth daily.   Yes [provider]  atorvastatin (LIPITOR) 40 MG tablet TAKE 1 TABLET (40 MG TOTAL) BY MOUTH DAILY. 09/14/17  Yes Kathrine Haddock, NP  budesonide-formoterol (SYMBICORT) 160-4.5 MCG/ACT inhaler Inhale 2 puffs into the lungs 2 (two) times daily. Patient taking differently: Inhale 2 puffs into the lungs as needed.  06/15/15  Yes Kathrine Haddock, NP  buPROPion (WELLBUTRIN) 75 MG tablet Take 75 mg by mouth daily. 03/26/16 05/08/18 Yes [provider]  clopidogrel (PLAVIX) 75 MG tablet TAKE 1 TABLET (75 MG TOTAL) BY MOUTH DAILY. 12/08/16  Yes Kathrine Haddock, NP  enalapril (VASOTEC) 10  MG tablet Take 10 mg by mouth daily. 08/19/17  Yes [provider]  enalapril (VASOTEC) 5 MG tablet Take 5 mg by mouth daily. 09/09/17  Yes [provider]  famotidine (PEPCID) 40 MG tablet TAKE 1 TABLET BY MOUTH EVERY DAY 09/09/17  Yes Kathrine Haddock, NP  ferrous sulfate (FERROUSUL) 325 (65 FE) MG tablet Take 1 tablet (325 mg total) by mouth daily with breakfast. 08/26/17 10/26/17 Yes [name], MD  furosemide (LASIX) 40 MG tablet Take 1 tablet (40 mg total) by mouth daily. 08/11/17  Yes  Wieting, Richard, MD  ipratropium-albuterol (DUONEB) 0.5-2.5 (3) MG/3ML SOLN Take 3 mLs by nebulization every 6 (six) hours as needed (wheezing; shortness of breath).   Yes [provider]  metFORMIN (GLUCOPHAGE) 1000 MG tablet TAKE 1 TABLET (1,000 MG TOTAL) BY MOUTH 2 (TWO) TIMES DAILY WITH A MEAL. 09/12/16  Yes Kathrine Haddock, NP  metoprolol succinate (TOPROL-XL) 100 MG 24 hr tablet Take by mouth. 08/19/17  Yes [provider]  nitroGLYCERIN (NITROSTAT) 0.4 MG SL tablet Place 0.4 mg under the tongue every 5 (five) minutes as needed for chest pain.   Yes [provider]  ONE TOUCH ULTRA TEST test strip USE TO TEST BLOOD SUGAR ONCE DAILY 10/07/16  Yes Johnson, Megan P, DO  pantoprazole (PROTONIX) 40 MG tablet Take 1 tablet (40 mg total) by mouth every morning. 06/29/17  Yes [name], MD  spironolactone (ALDACTONE) 25 MG tablet Take 1 tablet (25 mg total) by mouth daily. 08/12/17  Yes Wieting, Richard, MD  sucralfate (CARAFATE) 1 g tablet Take 1 g by mouth 4 (four) times daily -  with meals and at bedtime.  05/15/17 05/15/18 Yes [provider]  JANUVIA 100 MG tablet TAKE 1 TABLET (100 MG TOTAL) BY MOUTH DAILY. Patient not taking: Reported on 10/06/2017 09/14/17   Kathrine Haddock, NP  levETIRAcetam (KEPPRA) 500 MG tablet Take 500 mg by mouth 2 (two) times daily.    [provider]    Review of Systems  Constitutional: Negative for appetite change and fatigue.  HENT: Negative for congestion, postnasal drip and sore throat.   Eyes: Negative.   Respiratory: Positive for cough and shortness of breath (minimal). Negative for chest tightness.   Cardiovascular: Negative for chest pain, palpitations and leg swelling.  Gastrointestinal: Negative for abdominal distention and abdominal pain.  Endocrine: Negative.   Genitourinary: Negative.   Musculoskeletal: Negative for back pain and neck pain.  Skin: Negative.   Allergic/Immunologic: Negative.   Neurological: Negative  for dizziness, light-headedness and headaches.  Hematological: Negative for adenopathy. Does not bruise/bleed easily.  Psychiatric/Behavioral: Negative for dysphoric mood and sleep disturbance (hasn't been wearing CPAP ). The patient is not nervous/anxious.    Vitals:   10/06/17 0847  BP: 112/74  Pulse: 78  Resp: 18  SpO2: 98%  Weight: 149 lb 4 oz (67.7 kg)  Height: 5\' 4"  (1.626 m)   Wt Readings from Last 3 Encounters:  10/06/17 149 lb 4 oz (67.7 kg)  09/28/17 164 lb (74.4 kg)  09/17/17 147 lb 6.4 oz (66.9 kg)   Lab Results  Component Value Date   CREATININE 1.13 09/18/2017   CREATININE 0.96 08/11/2017   CREATININE 0.94 08/10/2017    Physical Exam  Constitutional: He is oriented to person, place, and time. He appears well-developed and well-nourished.  HENT:  Head: Normocephalic and atraumatic.  Neck: Normal range of motion. Neck supple. No JVD present.  Cardiovascular: Normal rate and regular rhythm.  Pulmonary/Chest: Effort  normal. He has no wheezes. He has no rales.  Abdominal: Soft. He exhibits no distension. There is no tenderness.  Musculoskeletal: He exhibits no edema or tenderness.  Neurological: He is alert and oriented to person, place, and time.  Skin: Skin is warm and dry.  Psychiatric: He has a normal mood and affect. His behavior is normal. Thought content normal.  Nursing note and vitals reviewed.  Assessment & Plan:  1: Chronic heart failure with reduced ejection fraction- - NYHA class II - euvolemic today - weighing daily and he was reminded to call for an overnight weight gain of >2 pounds or a weekly weight gain of >5 pounds.  - weight up a couple of pounds since he was last here - not adding salt; using Mrs. Dash; admits that he's still not reading food labels reminded him to keep his daily sodium intake to 2000mg  a day. Does eat quite a bit of processed food; rinses canned vegetables out - currently drinking a 1/2L soda, ~20 ounces of tea and 2-3  cups of coffee daily. Drinking 1 bottle of water daily - has worked on decreasing fluid intake - saw cardiology Clayborn Bigness) 06/12/17 - saw pulmonologist Raul Del) 06/22/17 - BNP 08/09/17 was 756.0 - currently has pacemaker/defibrillator  - current BP does not allow to change enalapril to entresto - could consider titrating up metoprolol at future visits - PharmD reconciled medications with the patient  2: HTN- - BP continues to be on the low side - saw PCP ([place]) 09/02/17 - BMP 09/18/17 reviewed and showed sodium 137, potassium 4.3 and GFR >60  3: Diabetes- - not checking his glucose often and he was encouraged to check it daily - he says that his glucose yesterday was 120 - he stopped taking his [place] as he read it could damage his heart. He sees his PCP tomorrow and he was instructed to discuss this with him - A1c 08/09/17 was 7.1%  4: Tobacco use- - currently smoking 2-3 cigarettes daily which is down from 2 ppd - complete cessation discussed for 3 minutes with patient  5: Obstructive sleep apnea- - still isn't wearing his CPAP due to not being able to get the straps tight enough - discussed the importance of wearing his CPAP nightly and that he may need to contact the company if he can't get it tight enough  Patient did not bring his medications nor a list. Each medication was verbally reviewed with the patient and he was encouraged to bring the bottles to every visit to confirm accuracy of list.   Uneasy about titrating up his metoprolol as he's not entirely sure of medications that he's taking. Emphasized the importance of bringing his bottles to every visit   Return in 3 months or sooner for any questions/problems before then.

## 2017-10-06 ENCOUNTER — Ambulatory Visit: Payer: Medicare Other | Attending: Family | Admitting: Family

## 2017-10-06 ENCOUNTER — Encounter: Payer: Self-pay | Admitting: Family

## 2017-10-06 VITALS — BP 112/74 | HR 78 | Resp 18 | Ht 64.0 in | Wt 149.2 lb

## 2017-10-06 DIAGNOSIS — F17210 Nicotine dependence, cigarettes, uncomplicated: Secondary | ICD-10-CM | POA: Insufficient documentation

## 2017-10-06 DIAGNOSIS — G4733 Obstructive sleep apnea (adult) (pediatric): Secondary | ICD-10-CM

## 2017-10-06 DIAGNOSIS — I252 Old myocardial infarction: Secondary | ICD-10-CM | POA: Insufficient documentation

## 2017-10-06 DIAGNOSIS — I5022 Chronic systolic (congestive) heart failure: Secondary | ICD-10-CM | POA: Diagnosis not present

## 2017-10-06 DIAGNOSIS — Z7951 Long term (current) use of inhaled steroids: Secondary | ICD-10-CM | POA: Insufficient documentation

## 2017-10-06 DIAGNOSIS — I2510 Atherosclerotic heart disease of native coronary artery without angina pectoris: Secondary | ICD-10-CM | POA: Insufficient documentation

## 2017-10-06 DIAGNOSIS — Z7982 Long term (current) use of aspirin: Secondary | ICD-10-CM | POA: Diagnosis not present

## 2017-10-06 DIAGNOSIS — K219 Gastro-esophageal reflux disease without esophagitis: Secondary | ICD-10-CM | POA: Diagnosis not present

## 2017-10-06 DIAGNOSIS — R0602 Shortness of breath: Secondary | ICD-10-CM | POA: Diagnosis present

## 2017-10-06 DIAGNOSIS — Z95810 Presence of automatic (implantable) cardiac defibrillator: Secondary | ICD-10-CM | POA: Insufficient documentation

## 2017-10-06 DIAGNOSIS — I110 Hypertensive heart disease with heart failure: Secondary | ICD-10-CM | POA: Insufficient documentation

## 2017-10-06 DIAGNOSIS — Z955 Presence of coronary angioplasty implant and graft: Secondary | ICD-10-CM | POA: Insufficient documentation

## 2017-10-06 DIAGNOSIS — E785 Hyperlipidemia, unspecified: Secondary | ICD-10-CM | POA: Diagnosis not present

## 2017-10-06 DIAGNOSIS — Z7984 Long term (current) use of oral hypoglycemic drugs: Secondary | ICD-10-CM | POA: Diagnosis not present

## 2017-10-06 DIAGNOSIS — E119 Type 2 diabetes mellitus without complications: Secondary | ICD-10-CM | POA: Diagnosis not present

## 2017-10-06 DIAGNOSIS — I10 Essential (primary) hypertension: Secondary | ICD-10-CM

## 2017-10-06 DIAGNOSIS — Z79899 Other long term (current) drug therapy: Secondary | ICD-10-CM | POA: Insufficient documentation

## 2017-10-06 DIAGNOSIS — Z720 Tobacco use: Secondary | ICD-10-CM

## 2017-10-06 DIAGNOSIS — J449 Chronic obstructive pulmonary disease, unspecified: Secondary | ICD-10-CM | POA: Diagnosis not present

## 2017-10-06 DIAGNOSIS — Z7902 Long term (current) use of antithrombotics/antiplatelets: Secondary | ICD-10-CM | POA: Insufficient documentation

## 2017-10-06 NOTE — Patient Instructions (Addendum)
Continue weighing daily and call for an overnight weight gain of > 2 pounds or a weekly weight gain of >5 pounds.  Bring medication bottles to next visit.

## 2017-10-14 ENCOUNTER — Ambulatory Visit (INDEPENDENT_AMBULATORY_CARE_PROVIDER_SITE_OTHER): Payer: Medicare Other | Admitting: Vascular Surgery

## 2017-10-14 ENCOUNTER — Encounter (INDEPENDENT_AMBULATORY_CARE_PROVIDER_SITE_OTHER): Payer: Self-pay | Admitting: Vascular Surgery

## 2017-10-14 ENCOUNTER — Ambulatory Visit (INDEPENDENT_AMBULATORY_CARE_PROVIDER_SITE_OTHER): Payer: Medicare Other

## 2017-10-14 VITALS — BP 121/71 | HR 84 | Resp 16 | Ht 64.0 in | Wt 153.0 lb

## 2017-10-14 DIAGNOSIS — E785 Hyperlipidemia, unspecified: Secondary | ICD-10-CM | POA: Diagnosis not present

## 2017-10-14 DIAGNOSIS — M79605 Pain in left leg: Secondary | ICD-10-CM

## 2017-10-14 DIAGNOSIS — M79604 Pain in right leg: Secondary | ICD-10-CM | POA: Diagnosis not present

## 2017-10-14 DIAGNOSIS — E119 Type 2 diabetes mellitus without complications: Secondary | ICD-10-CM | POA: Diagnosis not present

## 2017-10-14 DIAGNOSIS — Z720 Tobacco use: Secondary | ICD-10-CM | POA: Diagnosis not present

## 2017-10-14 LAB — SURGICAL PATHOLOGY

## 2017-10-14 NOTE — Progress Notes (Signed)
Subjective:    Patient ID: John Frank, male    DOB: [birth year], 62 y.o.   MRN: [id number] Chief Complaint  Patient presents with  . Follow-up    pt conv aorta iliac and abi   Patient presents to review vascular studies.  The patient was 09/15/2017 for evaluation of bilateral lower extremity pain.  The patient underwent a bilateral ABI which was notable for right triphasic tibials with normal great toe waveforms and left triphasic tibials with normal great toe waveforms.  Biphasic right common and external iliac artery.  Left biphasic common and external iliac artery.  Patient denies any progression in his symptoms.  Patient denies any rest pain or ulceration to the bilateral lower extremity.  Patient denies any fever, nausea or vomiting.  Review of Systems  Constitutional: Negative.   HENT: Negative.   Eyes: Negative.   Respiratory: Negative.   Cardiovascular:       Lower Extremity Pain  Gastrointestinal: Negative.   Endocrine: Negative.   Genitourinary: Negative.   Musculoskeletal: Negative.   Skin: Negative.   Allergic/Immunologic: Negative.   Neurological: Negative.   Hematological: Negative.   Psychiatric/Behavioral: Negative.       Objective:   Physical Exam  Constitutional: He is oriented to person, place, and time. He appears well-developed and well-nourished. No distress.  HENT:  Head: Normocephalic and atraumatic.  Right Ear: External ear normal.  Left Ear: External ear normal.  Eyes: Pupils are equal, round, and reactive to light. Conjunctivae and EOM are normal.  Neck: Normal range of motion.  Cardiovascular: Normal rate, regular rhythm, normal heart sounds and intact distal pulses.  Hard to palpate pedal pulses however the bilateral feet are warm  Pulmonary/Chest: Effort normal and breath sounds normal.  Musculoskeletal: Normal range of motion. He exhibits no edema.  Neurological: He is alert and oriented to person, place, and time.  Skin: Skin is warm and  dry. He is not diaphoretic.  Psychiatric: He has a normal mood and affect. His behavior is normal. Judgment and thought content normal.  Vitals reviewed.  BP 121/71 (BP Location: Right Arm)   Pulse 84   Resp 16   Ht 5\' 4"  (1.626 m)   Wt 153 lb (69.4 kg)   BMI 26.26 kg/m   Past Medical History:  Diagnosis Date  . CAD (coronary artery disease)    s/p stents in 2017  . CHF (congestive heart failure) ([place])   . COPD (chronic obstructive pulmonary disease) ([place])   . Diabetes ([place])   . GERD (gastroesophageal reflux disease)   . Heart attack ([place])   . Hyperlipidemia   . Hypertension   . Presence of permanent cardiac pacemaker   . Shortness of breath dyspnea   . Sleep apnea    Social History   Socioeconomic History  . Marital status: Single    Spouse name: Not on file  . Number of children: Not on file  . Years of education: Not on file  . Highest education level: Not on file  Occupational History  . Not on file  Social Needs  . Financial resource strain: Not on file  . Food insecurity:    Worry: Not on file    Inability: Not on file  . Transportation needs:    Medical: Not on file    Non-medical: Not on file  Tobacco Use  . Smoking status: Current Every Day Smoker    Packs/day: 1.00    Years: 40.00    Pack years: 40.00  Types: Cigarettes  . Smokeless tobacco: Former Network engineer and Sexual Activity  . Alcohol use: No  . Drug use: Never  . Sexual activity: Yes  Lifestyle  . Physical activity:    Days per week: Not on file    Minutes per session: Not on file  . Stress: Not on file  Relationships  . Social connections:    Talks on phone: Not on file    Gets together: Not on file    Attends religious service: Not on file    Active member of club or organization: Not on file    Attends meetings of clubs or organizations: Not on file    Relationship status: Not on file  . Intimate partner violence:    Fear of current or ex partner: Not on file     Emotionally abused: Not on file    Physically abused: Not on file    Forced sexual activity: Not on file  Other Topics Concern  . Not on file  Social History Narrative   Independent at baseline.  Lives at home with his family   Past Surgical History:  Procedure Laterality Date  . cardiac stents    . CARDIAC SURGERY     defibrillator and pacemaker  . CORONARY ANGIOPLASTY    . ESOPHAGOGASTRODUODENOSCOPY N/A 09/19/2014   Procedure: ESOPHAGOGASTRODUODENOSCOPY (EGD);  Surgeon: Lucilla Lame, MD;  Location: [facility] ENDOSCOPY;  Service: Endoscopy;  Laterality: N/A;  . ESOPHAGOGASTRODUODENOSCOPY (EGD) WITH PROPOFOL N/A 09/28/2017   Procedure: ESOPHAGOGASTRODUODENOSCOPY (EGD) WITH PROPOFOL;  Surgeon: Virgel Manifold, MD;  Location: [facility] ENDOSCOPY;  Service: Endoscopy;  Laterality: N/A;    Family History  Problem Relation Age of Onset  . Diabetes Mother   . Heart attack Father 12   No Known Allergies     Assessment & Plan:  Patient presents to review vascular studies.  The patient was 09/15/2017 for evaluation of bilateral lower extremity pain.  The patient underwent a bilateral ABI which was notable for right triphasic tibials with normal great toe waveforms and left triphasic tibials with normal great toe waveforms.  Biphasic right common and external iliac artery.  Left biphasic common and external iliac artery.  Patient denies any progression in his symptoms.  Patient denies any rest pain or ulceration to the bilateral lower extremity.  Patient denies any fever, nausea or vomiting.  1. Pain in both lower extremities - Stable Based on the patient's arterial duplex and ABI today I do not feel that his discomfort and weakness is from arterial insufficiency Recommend that the patient follow-up with his primary care physician for further work-up as he may be experiencing osteoarthritis/degenerative joint disease. The patient does have multiple risk factors for peripheral artery disease and  recommend him undergo another ABI in approximately 2 years I have discussed with the patient at length the risk factors for and pathogenesis of atherosclerotic disease and encouraged a healthy diet, regular exercise regimen and blood pressure / glucose control.  The patient was encouraged to call the office in the interim if he experiences any claudication like symptoms, rest pain or ulcers to his feet / toes.  - VAS [place] ABI WITH/WO TBI; Future  2. Tobacco abuse - Stable We had a discussion for approximately 10 minutes regarding the absolute need for smoking cessation due to the deleterious nature of tobacco on the vascular system. We discussed the tobacco use would diminish patency of any intervention, and likely significantly worsen progressio of disease. We discussed multiple agents for  quitting including replacement therapy or medications to reduce cravings such as Chantix. The patient voices their understanding of the importance of smoking cessation.  3. Hyperlipidemia, unspecified hyperlipidemia type - Stable Encouraged good control as its slows the progression of atherosclerotic disease  4. Diabetes mellitus without complication ([place]) - Stable Encouraged good control as its slows the progression of atherosclerotic disease  Current Outpatient Medications on File Prior to Visit  Medication Sig Dispense Refill  . albuterol (PROVENTIL HFA;VENTOLIN HFA) 108 (90 Base) MCG/ACT inhaler Inhale 2 puffs into the lungs every 6 (six) hours as needed for wheezing or shortness of breath. 1 Inhaler 2  . aspirin EC 81 MG tablet Take 81 mg by mouth daily.    Marland Kitchen atorvastatin (LIPITOR) 40 MG tablet TAKE 1 TABLET (40 MG TOTAL) BY MOUTH DAILY. 90 tablet 1  . budesonide-formoterol (SYMBICORT) 160-4.5 MCG/ACT inhaler Inhale 2 puffs into the lungs 2 (two) times daily. (Patient taking differently: Inhale 2 puffs into the lungs as needed. ) 1 Inhaler 3  . buPROPion (WELLBUTRIN) 75 MG tablet Take 75 mg by mouth daily.     . clopidogrel (PLAVIX) 75 MG tablet TAKE 1 TABLET (75 MG TOTAL) BY MOUTH DAILY. 90 tablet 3  . enalapril (VASOTEC) 10 MG tablet Take 10 mg by mouth daily.  11  . enalapril (VASOTEC) 5 MG tablet Take 5 mg by mouth daily.  1  . famotidine (PEPCID) 40 MG tablet TAKE 1 TABLET BY MOUTH EVERY DAY 90 tablet 3  . ferrous sulfate (FERROUSUL) 325 (65 FE) MG tablet Take 1 tablet (325 mg total) by mouth daily with breakfast. 61 tablet 0  . furosemide (LASIX) 40 MG tablet Take 1 tablet (40 mg total) by mouth daily. 30 tablet 0  . ipratropium-albuterol (DUONEB) 0.5-2.5 (3) MG/3ML SOLN Take 3 mLs by nebulization every 6 (six) hours as needed (wheezing; shortness of breath).    . levETIRAcetam (KEPPRA) 500 MG tablet Take 500 mg by mouth 2 (two) times daily.    . metFORMIN (GLUCOPHAGE) 1000 MG tablet TAKE 1 TABLET (1,000 MG TOTAL) BY MOUTH 2 (TWO) TIMES DAILY WITH A MEAL. 180 tablet 1  . metoprolol succinate (TOPROL-XL) 100 MG 24 hr tablet Take by mouth.    . nitroGLYCERIN (NITROSTAT) 0.4 MG SL tablet Place 0.4 mg under the tongue every 5 (five) minutes as needed for chest pain.    . ONE TOUCH ULTRA TEST test strip USE TO TEST BLOOD SUGAR ONCE DAILY 100 each 3  . pantoprazole (PROTONIX) 40 MG tablet Take 1 tablet (40 mg total) by mouth every morning. 90 tablet 1  . spironolactone (ALDACTONE) 25 MG tablet Take 1 tablet (25 mg total) by mouth daily. 30 tablet 0  . sucralfate (CARAFATE) 1 g tablet Take 1 g by mouth 4 (four) times daily -  with meals and at bedtime.     Marland Kitchen JANUVIA 100 MG tablet TAKE 1 TABLET (100 MG TOTAL) BY MOUTH DAILY. (Patient not taking: Reported on 10/06/2017) 90 tablet 1   No current facility-administered medications on file prior to visit.    There are no Patient Instructions on file for this visit. No follow-ups on file.  Isiaha Greenup A Shanyiah Conde, PA-C

## 2017-10-19 ENCOUNTER — Other Ambulatory Visit: Payer: Self-pay

## 2017-10-19 DIAGNOSIS — K2970 Gastritis, unspecified, without bleeding: Secondary | ICD-10-CM

## 2017-10-19 DIAGNOSIS — K3189 Other diseases of stomach and duodenum: Principal | ICD-10-CM

## 2017-10-19 DIAGNOSIS — K31A0 Gastric intestinal metaplasia, unspecified: Secondary | ICD-10-CM

## 2017-10-23 ENCOUNTER — Other Ambulatory Visit
Admission: RE | Admit: 2017-10-23 | Discharge: 2017-10-23 | Disposition: A | Discharge status: Home / Self Care | Payer: Medicare Other | Source: Ambulatory Visit | Attending: Gastroenterology | Admitting: Gastroenterology

## 2017-10-23 DIAGNOSIS — K2970 Gastritis, unspecified, without bleeding: Secondary | ICD-10-CM | POA: Insufficient documentation

## 2017-10-23 DIAGNOSIS — K3189 Other diseases of stomach and duodenum: Secondary | ICD-10-CM | POA: Diagnosis present

## 2017-10-26 ENCOUNTER — Other Ambulatory Visit: Payer: Self-pay | Admitting: Gastroenterology

## 2017-10-26 DIAGNOSIS — D509 Iron deficiency anemia, unspecified: Secondary | ICD-10-CM

## 2017-10-27 LAB — H PYLORI, IGM, IGG, IGA AB
H Pylori IgG: <0.8 Index Value (ref 0.00–0.79)
H. Pylogi, Igm Abs: <9 units (ref 0.0–8.9)

## 2017-11-02 ENCOUNTER — Other Ambulatory Visit: Payer: Self-pay

## 2017-11-02 DIAGNOSIS — D509 Iron deficiency anemia, unspecified: Secondary | ICD-10-CM

## 2017-11-02 MED ORDER — FERROUS SULFATE 325 (65 FE) MG PO TABS
325.0000 mg | ORAL_TABLET | Freq: Every day | ORAL | 0 refills | Status: DC
Start: 2017-11-02 — End: 2018-04-27

## 2017-11-27 ENCOUNTER — Other Ambulatory Visit: Payer: Self-pay

## 2017-11-27 NOTE — Progress Notes (Unsigned)
c 

## 2017-12-28 NOTE — Progress Notes (Signed)
Patient ID: John Frank, male    DOB: Dec 12, 1955, 62 y.o.   MRN: [id number]  HPI  John Frank is a 62 y/o male with a history of CAD, DM, hyperlipidemia, HTN, GERD, COPD, obstructive sleep apnea, chronic heart failure and current tobacco use.   Echo report from 04/13/17 reviewed and showed an EF of 30% along with mild John/TR.  Admitted 08/09/17 due to acute on chronic HF. Initially needed IV Lasix and then transitioned to oral diuretics. Paracentesis was done due to ascites and antibiotics were also given. Discharged after 2 days.   He presents today for a follow-up visit with a chief complaint of minimal shortness of breath upon moderate exertion. He describes this as chronic in nature having been present for many years. He feels like he currently had a bit of head cold. He has associated head congestion, cough and wheezing along with this. He denies any difficulty sleeping, abdominal distention, palpitations, pedal edema, chest pain, dizziness, fatigue or weight gain.   Past Medical History:  Diagnosis Date  . CAD (coronary artery disease)    s/p stents in 2017  . CHF (congestive heart failure) ([place])   . COPD (chronic obstructive pulmonary disease) ([place])   . Diabetes ([place])   . GERD (gastroesophageal reflux disease)   . Heart attack ([place])   . Hyperlipidemia   . Hypertension   . Presence of permanent cardiac pacemaker   . Shortness of breath dyspnea   . Sleep apnea    Past Surgical History:  Procedure Laterality Date  . cardiac stents    . CARDIAC SURGERY     defibrillator and pacemaker  . CORONARY ANGIOPLASTY    . ESOPHAGOGASTRODUODENOSCOPY N/A 09/19/2014   Procedure: ESOPHAGOGASTRODUODENOSCOPY (EGD);  Surgeon: Lucilla Lame, MD;  Location: [facility] ENDOSCOPY;  Service: Endoscopy;  Laterality: N/A;  . ESOPHAGOGASTRODUODENOSCOPY (EGD) WITH PROPOFOL N/A 09/28/2017   Procedure: ESOPHAGOGASTRODUODENOSCOPY (EGD) WITH PROPOFOL;  Surgeon: Virgel Manifold, MD;  Location: [facility] ENDOSCOPY;   Service: Endoscopy;  Laterality: N/A;   Family History  Problem Relation Age of Onset  . Diabetes Mother   . Heart attack Father 26   Social History   Tobacco Use  . Smoking status: Current Every Day Smoker    Packs/day: 1.00    Years: 40.00    Pack years: 40.00    Types: Cigarettes  . Smokeless tobacco: Former Network engineer Use Topics  . Alcohol use: No   No Known Allergies  Prior to Admission medications   Medication Sig Start Date End Date Taking? Authorizing Provider  albuterol (PROVENTIL HFA;VENTOLIN HFA) 108 (90 Base) MCG/ACT inhaler Inhale 2 puffs into the lungs every 6 (six) hours as needed for wheezing or shortness of breath. 05/26/17  Yes Henreitta Leber, MD  aspirin EC 81 MG tablet Take 81 mg by mouth daily.   Yes [provider]  atorvastatin (LIPITOR) 40 MG tablet TAKE 1 TABLET (40 MG TOTAL) BY MOUTH DAILY. 09/14/17  Yes Kathrine Haddock, NP  buPROPion (WELLBUTRIN) 75 MG tablet Take 75 mg by mouth daily. 03/26/16 05/08/18 Yes [provider]  clopidogrel (PLAVIX) 75 MG tablet TAKE 1 TABLET (75 MG TOTAL) BY MOUTH DAILY. 12/08/16  Yes Kathrine Haddock, NP  enalapril (VASOTEC) 5 MG tablet Take 5 mg by mouth daily. 09/09/17  Yes [provider]  famotidine (PEPCID) 40 MG tablet TAKE 1 TABLET BY MOUTH EVERY DAY 09/09/17  Yes Kathrine Haddock, NP  ferrous sulfate (FERROUSUL) 325 (65 FE) MG tablet Take 1  tablet (325 mg total) by mouth daily with breakfast. 11/02/17 01/02/18 Yes [name], MD  furosemide (LASIX) 40 MG tablet Take 1 tablet (40 mg total) by mouth daily. 08/11/17  Yes Wieting, Richard, MD  ipratropium-albuterol (DUONEB) 0.5-2.5 (3) MG/3ML SOLN Take 3 mLs by nebulization every 6 (six) hours as needed (wheezing; shortness of breath).   Yes [provider]  JANUVIA 100 MG tablet TAKE 1 TABLET (100 MG TOTAL) BY MOUTH DAILY. 09/14/17  Yes Kathrine Haddock, NP  metFORMIN (GLUCOPHAGE) 1000 MG tablet TAKE 1 TABLET (1,000 MG TOTAL) BY MOUTH 2 (TWO) TIMES  DAILY WITH A MEAL. 09/12/16  Yes Kathrine Haddock, NP  metoprolol succinate (TOPROL-XL) 100 MG 24 hr tablet Take by mouth. 08/19/17  Yes [provider]  pantoprazole (PROTONIX) 40 MG tablet Take 1 tablet (40 mg total) by mouth every morning. 06/29/17  Yes [name], MD  sucralfate (CARAFATE) 1 g tablet Take 1 g by mouth 4 (four) times daily -  with meals and at bedtime.  05/15/17 05/15/18 Yes [provider]  triamcinolone cream (KENALOG) 0.5 % Apply topically. 10/12/17 10/12/18 Yes [provider]  budesonide-formoterol (SYMBICORT) 160-4.5 MCG/ACT inhaler Inhale 2 puffs into the lungs 2 (two) times daily. Patient not taking: Reported on 12/29/2017 06/15/15   Kathrine Haddock, NP  enalapril (VASOTEC) 10 MG tablet Take 10 mg by mouth daily. 08/19/17   [provider]  levETIRAcetam (KEPPRA) 500 MG tablet Take 500 mg by mouth 2 (two) times daily.    [provider]  nitroGLYCERIN (NITROSTAT) 0.4 MG SL tablet Place 0.4 mg under the tongue every 5 (five) minutes as needed for chest pain.    [provider]  ONE TOUCH ULTRA TEST test strip USE TO TEST BLOOD SUGAR ONCE DAILY 10/07/16   [place] Liter P, DO  spironolactone (ALDACTONE) 25 MG tablet Take 1 tablet (25 mg total) by mouth daily. Patient not taking: Reported on 12/29/2017 08/12/17   Loletha Grayer, MD    Review of Systems  Constitutional: Negative for appetite change, fatigue and fever.  HENT: Positive for congestion. Negative for postnasal drip and sore throat.   Eyes: Negative.   Respiratory: Positive for cough, shortness of breath (minimal) and wheezing. Negative for chest tightness.   Cardiovascular: Negative for chest pain, palpitations and leg swelling.  Gastrointestinal: Negative for abdominal distention and abdominal pain.  Endocrine: Negative.   Genitourinary: Negative.   Musculoskeletal: Negative for back pain and neck pain.  Skin: Negative.   Allergic/Immunologic: Negative.   Neurological:  Negative for dizziness, light-headedness and headaches.  Hematological: Negative for adenopathy. Does not bruise/bleed easily.  Psychiatric/Behavioral: Negative for dysphoric mood and sleep disturbance (wearing CPAP ). The patient is not nervous/anxious.    Vitals:   12/29/17 0910  BP: (!) 110/54  Pulse: 82  Resp: 18  SpO2: 95%  Weight: 149 lb 8 oz (67.8 kg)  Height: 5\' 4"  (1.626 m)   Wt Readings from Last 3 Encounters:  12/29/17 149 lb 8 oz (67.8 kg)  10/14/17 153 lb (69.4 kg)  10/06/17 149 lb 4 oz (67.7 kg)   Lab Results  Component Value Date   CREATININE 1.13 09/18/2017   CREATININE 0.96 08/11/2017   CREATININE 0.94 08/10/2017    Physical Exam  Constitutional: He is oriented to person, place, and time. He appears well-developed and well-nourished.  HENT:  Head: Normocephalic and atraumatic.  Neck: Normal range of motion. Neck supple. No JVD present.  Cardiovascular: Normal rate and regular rhythm.  Pulmonary/Chest:  Effort normal. He has no wheezes. He has rhonchi in the right lower field and the left lower field. He has no rales.  Abdominal: Soft. He exhibits no distension. There is no tenderness.  Musculoskeletal: He exhibits no edema or tenderness.  Neurological: He is alert and oriented to person, place, and time.  Skin: Skin is warm and dry.  Psychiatric: He has a normal mood and affect. His behavior is normal. Thought content normal.  Nursing note and vitals reviewed.  Assessment & Plan:  1: Chronic heart failure with reduced ejection fraction- - NYHA class II - euvolemic today - weighing daily and he was reminded to call for an overnight weight gain of >2 pounds or a weekly weight gain of >5 pounds.  - weight stable from last visit here 3 months ago - not adding salt; using Mrs. Dash; admits that he's still not reading food labels reminded him to keep his daily sodium intake to 2000mg  a day. Does eat quite a bit of processed food; rinses canned vegetables  out - currently drinking a 1/2L soda, ~20 ounces of tea and 2-3 cups of coffee daily. Drinking 1 bottle of water daily - saw cardiology Clayborn Bigness) 06/12/17 - saw pulmonologist Raul Del) 06/22/17 - BNP 08/09/17 was 756.0 - currently has pacemaker/defibrillator  - current BP does not allow to change enalapril to entresto - could consider titrating up metoprolol at future visits if able - will resume spironolactone and start with 12.5mg  daily; will check BMP at his next visit - PharmD reconciled medications with the patient  2: HTN- - BP looks good today - saw PCP ([place]) 10/12/17 - BMP 10/12/17 reviewed and showed sodium 137, potassium 5.0 and GFR 68  3: Diabetes- - fasting glucose in clinic today was 153 - A1c 08/09/17 was 7.1%  4: Tobacco use- - currently smoking 2-3 cigarettes daily  - complete cessation discussed for 3 minutes with patient  5: Obstructive sleep apnea- - wearing CPAP nightly now  Patient did not bring his medications nor a list. Each medication was verbally reviewed with the patient and he was encouraged to bring the bottles to every visit to confirm accuracy of list.   Return in 1 month or sooner for any questions/problems before then.

## 2017-12-29 ENCOUNTER — Encounter: Payer: Self-pay | Admitting: Family

## 2017-12-29 ENCOUNTER — Ambulatory Visit: Payer: Medicare Other | Attending: Family | Admitting: Family

## 2017-12-29 VITALS — BP 110/54 | HR 82 | Resp 18 | Ht 64.0 in | Wt 149.5 lb

## 2017-12-29 DIAGNOSIS — Z8249 Family history of ischemic heart disease and other diseases of the circulatory system: Secondary | ICD-10-CM | POA: Insufficient documentation

## 2017-12-29 DIAGNOSIS — G4733 Obstructive sleep apnea (adult) (pediatric): Secondary | ICD-10-CM | POA: Insufficient documentation

## 2017-12-29 DIAGNOSIS — Z833 Family history of diabetes mellitus: Secondary | ICD-10-CM | POA: Insufficient documentation

## 2017-12-29 DIAGNOSIS — I10 Essential (primary) hypertension: Secondary | ICD-10-CM

## 2017-12-29 DIAGNOSIS — I2510 Atherosclerotic heart disease of native coronary artery without angina pectoris: Secondary | ICD-10-CM | POA: Insufficient documentation

## 2017-12-29 DIAGNOSIS — I110 Hypertensive heart disease with heart failure: Secondary | ICD-10-CM | POA: Insufficient documentation

## 2017-12-29 DIAGNOSIS — J449 Chronic obstructive pulmonary disease, unspecified: Secondary | ICD-10-CM | POA: Insufficient documentation

## 2017-12-29 DIAGNOSIS — E785 Hyperlipidemia, unspecified: Secondary | ICD-10-CM | POA: Insufficient documentation

## 2017-12-29 DIAGNOSIS — K219 Gastro-esophageal reflux disease without esophagitis: Secondary | ICD-10-CM | POA: Diagnosis not present

## 2017-12-29 DIAGNOSIS — Z950 Presence of cardiac pacemaker: Secondary | ICD-10-CM | POA: Diagnosis not present

## 2017-12-29 DIAGNOSIS — Z79899 Other long term (current) drug therapy: Secondary | ICD-10-CM | POA: Diagnosis not present

## 2017-12-29 DIAGNOSIS — Z955 Presence of coronary angioplasty implant and graft: Secondary | ICD-10-CM | POA: Insufficient documentation

## 2017-12-29 DIAGNOSIS — Z7984 Long term (current) use of oral hypoglycemic drugs: Secondary | ICD-10-CM | POA: Insufficient documentation

## 2017-12-29 DIAGNOSIS — Z7982 Long term (current) use of aspirin: Secondary | ICD-10-CM | POA: Diagnosis not present

## 2017-12-29 DIAGNOSIS — E119 Type 2 diabetes mellitus without complications: Secondary | ICD-10-CM | POA: Diagnosis present

## 2017-12-29 DIAGNOSIS — I5022 Chronic systolic (congestive) heart failure: Secondary | ICD-10-CM | POA: Insufficient documentation

## 2017-12-29 DIAGNOSIS — F17210 Nicotine dependence, cigarettes, uncomplicated: Secondary | ICD-10-CM | POA: Diagnosis not present

## 2017-12-29 DIAGNOSIS — Z7902 Long term (current) use of antithrombotics/antiplatelets: Secondary | ICD-10-CM | POA: Diagnosis not present

## 2017-12-29 DIAGNOSIS — Z720 Tobacco use: Secondary | ICD-10-CM

## 2017-12-29 DIAGNOSIS — I252 Old myocardial infarction: Secondary | ICD-10-CM | POA: Diagnosis not present

## 2017-12-29 LAB — GLUCOSE, CAPILLARY: Glucose-Capillary: 153 mg/dL — ABNORMAL HIGH (ref 70–99)

## 2017-12-29 MED ORDER — SPIRONOLACTONE 25 MG PO TABS: 12.5000 mg | ORAL_TABLET | Freq: Every day | ORAL | 3 refills | Status: DC

## 2017-12-29 NOTE — Patient Instructions (Addendum)
Continue weighing daily and call for an overnight weight gain of > 2 pounds or a weekly weight gain of >5 pounds.  Begin taking spironolactone 1/2 tablet daily.

## 2018-01-01 ENCOUNTER — Other Ambulatory Visit: Payer: Self-pay

## 2018-01-01 DIAGNOSIS — A048 Other specified bacterial intestinal infections: Secondary | ICD-10-CM

## 2018-01-04 ENCOUNTER — Other Ambulatory Visit: Payer: Self-pay | Admitting: Gastroenterology

## 2018-01-04 DIAGNOSIS — D509 Iron deficiency anemia, unspecified: Secondary | ICD-10-CM

## 2018-01-14 ENCOUNTER — Emergency Department: Payer: Medicare Other

## 2018-01-14 ENCOUNTER — Encounter: Payer: Self-pay | Admitting: Emergency Medicine

## 2018-01-14 ENCOUNTER — Other Ambulatory Visit: Payer: Self-pay

## 2018-01-14 ENCOUNTER — Emergency Department
Admission: EM | Admit: 2018-01-14 | Discharge: 2018-01-14 | Disposition: A | Discharge status: Home / Self Care | Payer: Medicare Other | Attending: Emergency Medicine | Admitting: Emergency Medicine

## 2018-01-14 DIAGNOSIS — Z7982 Long term (current) use of aspirin: Secondary | ICD-10-CM | POA: Insufficient documentation

## 2018-01-14 DIAGNOSIS — Z79899 Other long term (current) drug therapy: Secondary | ICD-10-CM | POA: Diagnosis not present

## 2018-01-14 DIAGNOSIS — R0602 Shortness of breath: Secondary | ICD-10-CM | POA: Diagnosis not present

## 2018-01-14 DIAGNOSIS — R05 Cough: Secondary | ICD-10-CM | POA: Diagnosis present

## 2018-01-14 DIAGNOSIS — I110 Hypertensive heart disease with heart failure: Secondary | ICD-10-CM | POA: Insufficient documentation

## 2018-01-14 DIAGNOSIS — J209 Acute bronchitis, unspecified: Secondary | ICD-10-CM | POA: Diagnosis not present

## 2018-01-14 DIAGNOSIS — F17210 Nicotine dependence, cigarettes, uncomplicated: Secondary | ICD-10-CM | POA: Diagnosis not present

## 2018-01-14 DIAGNOSIS — I5022 Chronic systolic (congestive) heart failure: Secondary | ICD-10-CM | POA: Diagnosis not present

## 2018-01-14 DIAGNOSIS — E119 Type 2 diabetes mellitus without complications: Secondary | ICD-10-CM | POA: Diagnosis not present

## 2018-01-14 DIAGNOSIS — Z7984 Long term (current) use of oral hypoglycemic drugs: Secondary | ICD-10-CM | POA: Diagnosis not present

## 2018-01-14 DIAGNOSIS — Z7902 Long term (current) use of antithrombotics/antiplatelets: Secondary | ICD-10-CM | POA: Insufficient documentation

## 2018-01-14 DIAGNOSIS — I259 Chronic ischemic heart disease, unspecified: Secondary | ICD-10-CM | POA: Diagnosis not present

## 2018-01-14 MED ORDER — AZITHROMYCIN 250 MG PO TABS: ORAL_TABLET | ORAL | 0 refills | Status: DC

## 2018-01-14 MED ORDER — IPRATROPIUM-ALBUTEROL 0.5-2.5 (3) MG/3ML IN SOLN
3.0000 mL | Freq: Once | RESPIRATORY_TRACT | Status: AC
  Administered 2018-01-14: 3 mL via RESPIRATORY_TRACT
  Filled 2018-01-14: qty 3

## 2018-01-14 MED ORDER — ALBUTEROL SULFATE HFA 108 (90 BASE) MCG/ACT IN AERS: 2.0000 | INHALATION_SPRAY | Freq: Four times a day (QID) | RESPIRATORY_TRACT | 2 refills | Status: DC | PRN

## 2018-01-14 NOTE — ED Triage Notes (Signed)
Says sick for one week with cough.  Headache and aching all over.  Coughing up green sputum, and says he looses his breath when he coughs alot

## 2018-01-14 NOTE — Discharge Instructions (Addendum)
Follow-up with your regular doctor in 3 to 4 days if not improving.  Return emergency department if worsening.  You will need to see your regular doctor in 3 to 4 weeks for repeat chest x-ray.  Take the medications as prescribed.

## 2018-01-14 NOTE — ED Provider Notes (Signed)
[facility] Emergency Department Provider Note  ____________________________________________   First MD Initiated Contact with Patient 01/14/18 1004     (approximate)  I have reviewed the triage vital signs and the nursing notes.   HISTORY  Chief Complaint Cough and Shortness of Breath    HPI John Frank is a 62 y.o. malepresents emergency department complaining of cough and congestion with yellow to green mucus.  Symptoms for 7 days.  States he is having difficulty sleeping due to the cough.  States he is aching all over.  Denies fever, chills, chest pain or shortness of breath.   Past Medical History:  Diagnosis Date  . CAD (coronary artery disease)    s/p stents in 2017  . CHF (congestive heart failure) ([place])   . COPD (chronic obstructive pulmonary disease) ([place])   . Diabetes ([place])   . GERD (gastroesophageal reflux disease)   . Heart attack ([place])   . Hyperlipidemia   . Hypertension   . Presence of permanent cardiac pacemaker   . Shortness of breath dyspnea   . Sleep apnea     Patient Active Problem List   Diagnosis Date Noted  . Iron deficiency anemia   . Cirrhosis of liver with ascites ([place])   . Pain in both lower extremities 09/15/2017  . Chronic systolic heart failure ([place]) 08/31/2017  . CHF exacerbation ([place]) 08/09/2017  . Recurrent syncope 05/26/2017  . Tobacco abuse 04/06/2015  . HTN (hypertension) 11/03/2014  . Sleep apnea 10/26/2014  . CAD (coronary artery disease) 10/26/2014  . GERD (gastroesophageal reflux disease) 10/26/2014  . Polyp of colon 10/26/2014  . Diabetes mellitus without complication ([place]) 63/84/6659  . Hyperlipidemia 10/26/2014  . COPD, severe ([place]) 10/26/2014  . Status cardiac pacemaker 10/26/2014  . Lesion of nasal septum 10/26/2014    Past Surgical History:  Procedure Laterality Date  . cardiac stents    . CARDIAC SURGERY     defibrillator and pacemaker  . CORONARY ANGIOPLASTY    .  ESOPHAGOGASTRODUODENOSCOPY N/A 09/19/2014   Procedure: ESOPHAGOGASTRODUODENOSCOPY (EGD);  Surgeon: Lucilla Lame, MD;  Location: [facility] ENDOSCOPY;  Service: Endoscopy;  Laterality: N/A;  . ESOPHAGOGASTRODUODENOSCOPY (EGD) WITH PROPOFOL N/A 09/28/2017   Procedure: ESOPHAGOGASTRODUODENOSCOPY (EGD) WITH PROPOFOL;  Surgeon: Virgel Manifold, MD;  Location: [facility] ENDOSCOPY;  Service: Endoscopy;  Laterality: N/A;    Prior to Admission medications   Medication Sig Start Date End Date Taking? Authorizing Provider  albuterol (PROVENTIL HFA;VENTOLIN HFA) 108 (90 Base) MCG/ACT inhaler Inhale 2 puffs into the lungs every 6 (six) hours as needed for wheezing or shortness of breath. 05/26/17   Henreitta Leber, MD  albuterol (PROVENTIL HFA;VENTOLIN HFA) 108 (90 Base) MCG/ACT inhaler Inhale 2 puffs into the lungs every 6 (six) hours as needed for wheezing or shortness of breath. 01/14/18   Caryn Section Linden Dolin, PA-C  aspirin EC 81 MG tablet Take 81 mg by mouth daily.    [provider]  atorvastatin (LIPITOR) 40 MG tablet TAKE 1 TABLET (40 MG TOTAL) BY MOUTH DAILY. 09/14/17   Kathrine Haddock, NP  azithromycin (ZITHROMAX Z-PAK) 250 MG tablet 2 pills today then 1 pill a day for 4 days 01/14/18   Caryn Section Linden Dolin, PA-C  budesonide-formoterol [facility]) 160-4.5 MCG/ACT inhaler Inhale 2 puffs into the lungs 2 (two) times daily. Patient not taking: Reported on 12/29/2017 06/15/15   Kathrine Haddock, NP  buPROPion [facility]) 75 MG tablet Take 75 mg by mouth daily. 03/26/16 05/08/18  [provider]  clopidogrel (  PLAVIX) 75 MG tablet TAKE 1 TABLET (75 MG TOTAL) BY MOUTH DAILY. 12/08/16   Kathrine Haddock, NP  enalapril (VASOTEC) 5 MG tablet Take 5 mg by mouth daily. 09/09/17   [provider]  famotidine (PEPCID) 40 MG tablet TAKE 1 TABLET BY MOUTH EVERY DAY 09/09/17   Kathrine Haddock, NP  ferrous sulfate (FERROUSUL) 325 (65 FE) MG tablet Take 1 tablet (325 mg total) by mouth daily with breakfast. 11/02/17 01/02/18  [name], MD  furosemide (LASIX) 40 MG tablet Take 1 tablet (40 mg total) by mouth daily. 08/11/17   Loletha Grayer, MD  ipratropium-albuterol (DUONEB) 0.5-2.5 (3) MG/3ML SOLN Take 3 mLs by nebulization every 6 (six) hours as needed (wheezing; shortness of breath).    [provider]  JANUVIA 100 MG tablet TAKE 1 TABLET (100 MG TOTAL) BY MOUTH DAILY. 09/14/17   Kathrine Haddock, NP  levETIRAcetam (KEPPRA) 500 MG tablet Take 500 mg by mouth 2 (two) times daily.    [provider]  metFORMIN (GLUCOPHAGE) 1000 MG tablet TAKE 1 TABLET (1,000 MG TOTAL) BY MOUTH 2 (TWO) TIMES DAILY WITH A MEAL. 09/12/16   Kathrine Haddock, NP  metoprolol succinate (TOPROL-XL) 100 MG 24 hr tablet Take by mouth. 08/19/17   [provider]  nitroGLYCERIN (NITROSTAT) 0.4 MG SL tablet Place 0.4 mg under the tongue every 5 (five) minutes as needed for chest pain.    [provider]  ONE TOUCH ULTRA TEST test strip USE TO TEST BLOOD SUGAR ONCE DAILY 10/07/16   Wynetta Emery, Megan P, DO  pantoprazole (PROTONIX) 40 MG tablet Take 1 tablet (40 mg total) by mouth every morning. 06/29/17   [name], MD  spironolactone (ALDACTONE) 25 MG tablet Take 0.5 tablets (12.5 mg total) by mouth daily. 12/29/17   Alisa Graff, FNP  sucralfate (CARAFATE) 1 g tablet Take 1 g by mouth 4 (four) times daily -  with meals and at bedtime.  05/15/17 05/15/18  [provider]  triamcinolone cream (KENALOG) 0.5 % Apply topically. 10/12/17 10/12/18  [provider]    Allergies Patient has no known allergies.  Family History  Problem Relation Age of Onset  . Diabetes Mother   . Heart attack Father 18    Social History Social History   Tobacco Use  . Smoking status: Current Every Day Smoker    Packs/day: 1.00    Years: 40.00    Pack years: 40.00    Types: Cigarettes  . Smokeless tobacco: Former Network engineer Use Topics  . Alcohol use: No  . Drug use: Never    Review of Systems  Constitutional: No  fever/chills Eyes: No visual changes. ENT: No sore throat. Respiratory: Positive cough and congestion, positive wheezing Genitourinary: Negative for dysuria. Musculoskeletal: Negative for back pain. Skin: Negative for rash.    ____________________________________________   PHYSICAL EXAM:  VITAL SIGNS: ED Triage Vitals  Enc Vitals Group     BP 01/14/18 0944 113/77     Pulse Rate 01/14/18 0944 82     Resp 01/14/18 0944 18     Temp 01/14/18 0944 97.6 F (36.4 C)     Temp Source 01/14/18 0944 Oral     SpO2 01/14/18 0944 96 %     Weight 01/14/18 0945 149 lb 7.6 oz (67.8 kg)     Height 01/14/18 0945 5\' 4"  (1.626 m)     Head Circumference --      Peak Flow --      Pain Score  01/14/18 0945 8     Pain Loc --      Pain Edu? --      Excl. in [place]? --     Constitutional: Alert and oriented. Well appearing and in no acute distress. Eyes: Conjunctivae are normal.  Head: Atraumatic. ENT: [place] clear bilaterally Nose: No congestion/rhinnorhea. Mouth/Throat: Mucous membranes are moist.   NECK: Is supple, no lymphadenopathy is noted  cardiovascular: Normal rate, regular rhythm.  Heart sounds are normal Respiratory: Normal respiratory effort.  No retractions, lungs with wheezing in both lung fields GU: deferred Musculoskeletal: FROM all extremities, warm and well perfused Neurologic:  Normal speech and language.  Skin:  Skin is warm, dry and intact. No rash noted. Psychiatric: Mood and affect are normal. Speech and behavior are normal.  ____________________________________________   LABS (all labs ordered are listed, but only abnormal results are displayed)  Labs Reviewed - No data to display ____________________________________________   ____________________________________________  RADIOLOGY  Chest x-ray shows 2 lesions which are questionable for pneumonia versus atelectasis.  Radiologist recommends that the patient have a repeat chest x-ray in 3 to 4  weeks.  ____________________________________________   PROCEDURES  Procedure(s) performed: DuoNeb nebulizer treatment  Procedures    ____________________________________________   INITIAL IMPRESSION / ASSESSMENT AND PLAN / ED COURSE  Pertinent labs & imaging results that were available during my care of the patient were reviewed by me and considered in my medical decision making (see chart for details).   Patient is a 62 year old male presents emergency department complaining of a cough, headache and aching all over for approximately 1 week.  He states he is coughing up green sputum.  He states he is losing his breath when he coughs a lot.  He denies chest pain or shortness of breath.  No swelling the extremities.  On physical exam the patient has a hacking cough.  Lungs with some mild wheezing noted.  Remainder the exam is unremarkable  Chest x-ray shows questionable pneumonia versus atelectasis.  Radiologist recommends a repeat chest x-ray in 3 to 4 weeks.  DuoNeb given.  Patient has increased air movement and decreased coughing with the DuoNeb.  Wheezing has resolved.  Explained all the test results and findings to the patient.  He is diagnosed with acute bronchitis.  He was given a prescription for Z-Pak and albuterol inhaler.  He is to take over-the-counter cold medicines as needed.  Follow-up with his regular doctor if not better in 3 to 5 days.  He states he understands will comply with our instructions.  He was discharged in stable condition     As part of my medical decision making, I reviewed the following data within the [place] notes reviewed and incorporated, Old chart reviewed, Radiograph reviewed chest x-ray shows questionable, Notes from prior ED visits and [place] Controlled Substance Database  ____________________________________________   FINAL CLINICAL IMPRESSION(S) / ED DIAGNOSES  Final diagnoses:  Acute bronchitis, unspecified  organism      NEW MEDICATIONS STARTED DURING THIS VISIT:  New Prescriptions   ALBUTEROL (PROVENTIL HFA;VENTOLIN HFA) 108 (90 BASE) MCG/ACT INHALER    Inhale 2 puffs into the lungs every 6 (six) hours as needed for wheezing or shortness of breath.   AZITHROMYCIN (ZITHROMAX Z-PAK) 250 MG TABLET    2 pills today then 1 pill a day for 4 days     Note:  This document was prepared using Dragon voice recognition software and may include unintentional dictation errors.  Versie Starks, PA-C 01/14/18 1318    Carrie Mew, MD 01/19/18 2249

## 2018-01-14 NOTE — ED Notes (Signed)
First Nurse Note: Patient states he has had a cough, complaining of HA X 1 week.  States "I don't know if I have the flu or walking pneumonia".  Mask placed on patient.  Alert and oriented.  NAD.

## 2018-01-18 ENCOUNTER — Ambulatory Visit: Payer: Medicare Other | Admitting: Gastroenterology

## 2018-01-18 ENCOUNTER — Encounter: Payer: Self-pay | Admitting: Gastroenterology

## 2018-01-18 DIAGNOSIS — R188 Other ascites: Secondary | ICD-10-CM

## 2018-01-18 NOTE — Progress Notes (Deleted)
Summary of history : He was admitted in 08/2017 for CHF , EF 30% , right pleural effusion and ascites. Treated with diuresis , was noted to have ascites and a tap after giving him antibiotics showed high white cell count. GI was not consulted. CT abdomen showed ascites, possible early cirrhosis, right pleural effusion. Ascites fluid culture was negative . Ascitic fluid wcc 664 with 16% neutrophils. SAAG 1.6 which indicates possible portal hypertension.He has been seen previously for constipation.  08/03/17- Iron studies indicate possible deficiency. Normal B12, folate.H pylori stool antigen negative.  EGD in 2016 - normal. Ct abdomen in 01/2017 sowed right inguinal hernia. H/o GERD,COPD Hep A ab,Hep C ab  -negative . Immune to hep B, HIV negative . Inr 1.14. Cr 0.96 .   Interval history4/17/19-/9/919   09/28/17: No esophageal varices, gastritis and intestinal metaplasia seen. No H pylori.   10/12/17 : Hb 12.0 , CMP normal   Weight has been stable. Feels better, no shortness of breath . Legs swelling has resolved.   Seen by Dr Clayborn Bigness in 08/2017 his cardiologist and has scheduled follow up in 6 months  He is on aldactone and lasix. No canned foods, no frozen meals . Occasional  roast beef sandwiches . Uses Dash .   John Frank is a 62 y.o. y/o malehere to follow recent hospitalization for  respiratory failure, CHF, ascites and pleural effusion . Hard to determine but likely may have a combination of CHF with an element of possible cirrhosis based on imaging . No evidence of portal hypertension biochemical or radiologically. SAAG is > 1.1 which can indicate ascites from portal hypertension. Presently on diuretics started by Cardiology for CHF.Clinically heart failure has been well controlled and euvolemic fluid status.    Plan  1.Check CBC, on oral iron , check LFT,INR, Hep A Ab  2.  low sodium diet  3. EGD in 03/2018  for gastric mapping for intestinal metaplasia- will need  plavix holding instructrions 4. H pylori breath test , Hep A vaccine with Idelle Crouch, MD  5. RUQ USG to screen for [facility]

## 2018-01-26 ENCOUNTER — Encounter: Payer: Self-pay | Admitting: Family

## 2018-01-26 ENCOUNTER — Ambulatory Visit: Payer: Medicare Other | Attending: Family | Admitting: Family

## 2018-01-26 VITALS — BP 120/62 | HR 75 | Resp 18 | Ht 64.0 in | Wt 149.0 lb

## 2018-01-26 DIAGNOSIS — F17210 Nicotine dependence, cigarettes, uncomplicated: Secondary | ICD-10-CM | POA: Diagnosis not present

## 2018-01-26 DIAGNOSIS — Z7982 Long term (current) use of aspirin: Secondary | ICD-10-CM | POA: Diagnosis not present

## 2018-01-26 DIAGNOSIS — Z79899 Other long term (current) drug therapy: Secondary | ICD-10-CM | POA: Insufficient documentation

## 2018-01-26 DIAGNOSIS — Z09 Encounter for follow-up examination after completed treatment for conditions other than malignant neoplasm: Secondary | ICD-10-CM | POA: Insufficient documentation

## 2018-01-26 DIAGNOSIS — Z955 Presence of coronary angioplasty implant and graft: Secondary | ICD-10-CM | POA: Diagnosis not present

## 2018-01-26 DIAGNOSIS — I10 Essential (primary) hypertension: Secondary | ICD-10-CM

## 2018-01-26 DIAGNOSIS — J440 Chronic obstructive pulmonary disease with acute lower respiratory infection: Secondary | ICD-10-CM | POA: Insufficient documentation

## 2018-01-26 DIAGNOSIS — I2510 Atherosclerotic heart disease of native coronary artery without angina pectoris: Secondary | ICD-10-CM | POA: Diagnosis not present

## 2018-01-26 DIAGNOSIS — Z95810 Presence of automatic (implantable) cardiac defibrillator: Secondary | ICD-10-CM | POA: Insufficient documentation

## 2018-01-26 DIAGNOSIS — R188 Other ascites: Secondary | ICD-10-CM | POA: Insufficient documentation

## 2018-01-26 DIAGNOSIS — Z7902 Long term (current) use of antithrombotics/antiplatelets: Secondary | ICD-10-CM | POA: Insufficient documentation

## 2018-01-26 DIAGNOSIS — Z7984 Long term (current) use of oral hypoglycemic drugs: Secondary | ICD-10-CM | POA: Diagnosis not present

## 2018-01-26 DIAGNOSIS — Z9889 Other specified postprocedural states: Secondary | ICD-10-CM | POA: Insufficient documentation

## 2018-01-26 DIAGNOSIS — Z833 Family history of diabetes mellitus: Secondary | ICD-10-CM | POA: Diagnosis not present

## 2018-01-26 DIAGNOSIS — E785 Hyperlipidemia, unspecified: Secondary | ICD-10-CM | POA: Insufficient documentation

## 2018-01-26 DIAGNOSIS — I5022 Chronic systolic (congestive) heart failure: Secondary | ICD-10-CM | POA: Insufficient documentation

## 2018-01-26 DIAGNOSIS — I252 Old myocardial infarction: Secondary | ICD-10-CM | POA: Insufficient documentation

## 2018-01-26 DIAGNOSIS — I110 Hypertensive heart disease with heart failure: Secondary | ICD-10-CM | POA: Diagnosis not present

## 2018-01-26 DIAGNOSIS — K219 Gastro-esophageal reflux disease without esophagitis: Secondary | ICD-10-CM | POA: Insufficient documentation

## 2018-01-26 DIAGNOSIS — G4733 Obstructive sleep apnea (adult) (pediatric): Secondary | ICD-10-CM | POA: Insufficient documentation

## 2018-01-26 DIAGNOSIS — Z8249 Family history of ischemic heart disease and other diseases of the circulatory system: Secondary | ICD-10-CM | POA: Insufficient documentation

## 2018-01-26 DIAGNOSIS — E119 Type 2 diabetes mellitus without complications: Secondary | ICD-10-CM | POA: Insufficient documentation

## 2018-01-26 DIAGNOSIS — Z720 Tobacco use: Secondary | ICD-10-CM

## 2018-01-26 LAB — BASIC METABOLIC PANEL
ANION GAP: 10 (ref 5–15)
BUN: 12 mg/dL (ref 8–23)
CO2: 29 mmol/L (ref 22–32)
Calcium: 9.8 mg/dL (ref 8.9–10.3)
Chloride: 100 mmol/L (ref 98–111)
Creatinine, Ser: 0.91 mg/dL (ref 0.61–1.24)
GFR calc Af Amer: >60 mL/min (ref >60)
GFR calc non Af Amer: >60 mL/min (ref >60)
GLUCOSE: 155 mg/dL — AB (ref 70–99)
POTASSIUM: 4.4 mmol/L (ref 3.5–5.1)
Sodium: 139 mmol/L (ref 135–145)

## 2018-01-26 NOTE — Progress Notes (Signed)
Patient ID: John Frank, male    DOB: September 06, 1955, 62 y.o.   MRN: [id number]  HPI  [name] is a 62 y/o male with a history of CAD, DM, hyperlipidemia, HTN, GERD, COPD, obstructive sleep apnea, chronic heart failure and current tobacco use.   Echo report from 04/13/17 reviewed and showed an EF of 30% along with mild MR/TR.  Was in the ED 01/14/18 due to acute bronchitis where he was treated and released. Admitted 08/09/17 due to acute on chronic HF. Initially needed IV Lasix and then transitioned to oral diuretics. Paracentesis was done due to ascites and antibiotics were also given. Discharged after 2 days.   He presents today for a follow-up visit with a chief complaint of minimal shortness of breath upon moderate exertion. He describes this as chronic in nature having been present for several years. He has associated cough, wheezing and light-headedness along with this. He denies any fatigue, chest pain, edema, palpitations, abdominal distention, headaches, difficulty sleeping or weight gain. Has recently been treated for bronchitis.   Past Medical History:  Diagnosis Date  . CAD (coronary artery disease)    s/p stents in 2017  . CHF (congestive heart failure) ([place])   . COPD (chronic obstructive pulmonary disease) ([place])   . Diabetes ([place])   . GERD (gastroesophageal reflux disease)   . Heart attack ([place])   . Hyperlipidemia   . Hypertension   . Presence of permanent cardiac pacemaker   . Shortness of breath dyspnea   . Sleep apnea    Past Surgical History:  Procedure Laterality Date  . cardiac stents    . CARDIAC SURGERY     defibrillator and pacemaker  . CORONARY ANGIOPLASTY    . ESOPHAGOGASTRODUODENOSCOPY N/A 09/19/2014   Procedure: ESOPHAGOGASTRODUODENOSCOPY (EGD);  Surgeon: Lucilla Lame, MD;  Location: [facility] ENDOSCOPY;  Service: Endoscopy;  Laterality: N/A;  . ESOPHAGOGASTRODUODENOSCOPY (EGD) WITH PROPOFOL N/A 09/28/2017   Procedure: ESOPHAGOGASTRODUODENOSCOPY (EGD) WITH  PROPOFOL;  Surgeon: Virgel Manifold, MD;  Location: [facility] ENDOSCOPY;  Service: Endoscopy;  Laterality: N/A;   Family History  Problem Relation Age of Onset  . Diabetes Mother   . Heart attack Father 34   Social History   Tobacco Use  . Smoking status: Current Every Day Smoker    Packs/day: 1.00    Years: 40.00    Pack years: 40.00    Types: Cigarettes  . Smokeless tobacco: Former Network engineer Use Topics  . Alcohol use: No   No Known Allergies  Prior to Admission medications   Medication Sig Start Date End Date Taking? Authorizing Provider  albuterol (PROVENTIL HFA;VENTOLIN HFA) 108 (90 Base) MCG/ACT inhaler Inhale 2 puffs into the lungs every 6 (six) hours as needed for wheezing or shortness of breath. 05/26/17  Yes Henreitta Leber, MD  albuterol (PROVENTIL) (2.5 MG/3ML) 0.083% nebulizer solution Take 2.5 mg by nebulization every 6 (six) hours as needed for wheezing or shortness of breath.   Yes [provider]  aspirin EC 81 MG tablet Take 81 mg by mouth daily.   Yes [provider]  atorvastatin (LIPITOR) 40 MG tablet TAKE 1 TABLET (40 MG TOTAL) BY MOUTH DAILY. 09/14/17  Yes Kathrine Haddock, NP  buPROPion (WELLBUTRIN) 75 MG tablet Take 75 mg by mouth daily. 03/26/16 05/08/18 Yes [provider]  clopidogrel (PLAVIX) 75 MG tablet TAKE 1 TABLET (75 MG TOTAL) BY MOUTH DAILY. 12/08/16  Yes Kathrine Haddock, NP  enalapril (VASOTEC) 5 MG tablet Take 5 mg  by mouth daily. 09/09/17  Yes [provider]  famotidine (PEPCID) 40 MG tablet TAKE 1 TABLET BY MOUTH EVERY DAY 09/09/17  Yes Kathrine Haddock, NP  ferrous sulfate (FERROUSUL) 325 (65 FE) MG tablet Take 1 tablet (325 mg total) by mouth daily with breakfast. 11/02/17 01/27/19 Yes [name], MD  furosemide (LASIX) 40 MG tablet Take 1 tablet (40 mg total) by mouth daily. 08/11/17  Yes Wieting, Richard, MD  JANUVIA 100 MG tablet TAKE 1 TABLET (100 MG TOTAL) BY MOUTH DAILY. 09/14/17  Yes Kathrine Haddock, NP   metFORMIN (GLUCOPHAGE) 1000 MG tablet TAKE 1 TABLET (1,000 MG TOTAL) BY MOUTH 2 (TWO) TIMES DAILY WITH A MEAL. 09/12/16  Yes Kathrine Haddock, NP  metoprolol succinate (TOPROL-XL) 100 MG 24 hr tablet Take 100 mg by mouth.  08/19/17  Yes [provider]  ONE TOUCH ULTRA TEST test strip USE TO TEST BLOOD SUGAR ONCE DAILY 10/07/16  Yes Johnson, Megan P, DO  pantoprazole (PROTONIX) 40 MG tablet Take 1 tablet (40 mg total) by mouth every morning. Patient taking differently: Take 40 mg by mouth daily before breakfast.  06/29/17  Yes [name], MD  spironolactone (ALDACTONE) 25 MG tablet Take 0.5 tablets (12.5 mg total) by mouth daily. 12/29/17  Yes Darylene Price A, FNP  sucralfate (CARAFATE) 1 g tablet Take 1 g by mouth 4 (four) times daily -  with meals and at bedtime.  05/15/17 05/15/18 Yes [provider]  albuterol (PROVENTIL HFA;VENTOLIN HFA) 108 (90 Base) MCG/ACT inhaler Inhale 2 puffs into the lungs every 6 (six) hours as needed for wheezing or shortness of breath. Patient not taking: Reported on 01/26/2018 01/14/18   Versie Starks, PA-C  budesonide-formoterol [facility]) 160-4.5 MCG/ACT inhaler Inhale 2 puffs into the lungs 2 (two) times daily. Patient not taking: Reported on 12/29/2017 06/15/15   Kathrine Haddock, NP  ipratropium-albuterol (DUONEB) 0.5-2.5 (3) MG/3ML SOLN Take 3 mLs by nebulization every 6 (six) hours as needed (wheezing; shortness of breath).    [provider]    Review of Systems  Constitutional: Negative for appetite change and fever.  HENT: Negative for congestion, postnasal drip and sore throat.   Eyes: Negative.   Respiratory: Positive for cough, shortness of breath (when coughing) and wheezing. Negative for chest tightness.   Cardiovascular: Negative for chest pain, palpitations and leg swelling.  Gastrointestinal: Negative for abdominal distention and abdominal pain.  Endocrine: Negative.   Genitourinary: Negative.   Musculoskeletal: Negative for back  pain and neck pain.  Skin: Negative.   Allergic/Immunologic: Negative.   Neurological: Positive for light-headedness (at times). Negative for dizziness and headaches.  Hematological: Negative for adenopathy. Does not bruise/bleed easily.  Psychiatric/Behavioral: Negative for dysphoric mood and sleep disturbance (wearing CPAP ). The patient is not nervous/anxious.    Vitals:   01/26/18 0859  BP: 120/62  Pulse: 75  Resp: 18  SpO2: 98%  Weight: 149 lb (67.6 kg)  Height: 5\' 4"  (1.626 m)   Wt Readings from Last 3 Encounters:  01/26/18 149 lb (67.6 kg)  01/14/18 149 lb 7.6 oz (67.8 kg)  12/29/17 149 lb 8 oz (67.8 kg)   Lab Results  Component Value Date   CREATININE 1.13 09/18/2017   CREATININE 0.96 08/11/2017   CREATININE 0.94 08/10/2017    Physical Exam  Constitutional: He is oriented to person, place, and time. He appears well-developed and well-nourished.  HENT:  Head: Normocephalic and atraumatic.  Neck: Normal range of motion. Neck supple. No JVD present.  Cardiovascular: Normal rate and regular rhythm.  Pulmonary/Chest: Effort normal. He has no wheezes. He has rhonchi in the right lower field and the left lower field. He has no rales.  Abdominal: Soft. He exhibits no distension. There is no tenderness.  Musculoskeletal: He exhibits no edema or tenderness.  Neurological: He is alert and oriented to person, place, and time.  Skin: Skin is warm and dry.  Psychiatric: He has a normal mood and affect. His behavior is normal. Thought content normal.  Nursing note and vitals reviewed.  Assessment & Plan:  1: Chronic heart failure with reduced ejection fraction- - NYHA class II - euvolemic today - weighing daily and he was reminded to call for an overnight weight gain of >2 pounds or a weekly weight gain of >5 pounds.  - weight stable from last visit here 3 months ago - not adding salt; using Mrs. Dash; admits that he's still not reading food labels reminded him to keep his  daily sodium intake to 2000mg  a day. Does eat quite a bit of processed food; rinses canned vegetables out - currently drinking a 1/2L soda, ~20 ounces of tea and 2-3 cups of coffee daily. Drinking 1 bottle of water daily - saw cardiology Clayborn Bigness) 08/19/17 - saw pulmonologist Raul Del) 06/22/17 - BNP 08/09/17 was 756.0 - currently has pacemaker/defibrillator  - spironolactone was resumed at last visit so will get BMP today - should BP continue to look good, consider titrating metoprolol succinate or changing enalapril to entresto - PharmD reconciled medications with the patient  2: HTN- - BP looks good today; was on the low side at last visit - saw PCP ([facility]) 10/12/17 - BMP 10/12/17 reviewed and showed sodium 137, potassium 5.0 and GFR 68  3: Diabetes- - hasn't checked his glucose in a few days; will not check in clinic today due to getting BMP - A1c 08/09/17 was 7.1%  4: Tobacco use- - currently smoking 2-3 cigarettes daily  - complete cessation discussed for 3 minutes with patient  Patient did not bring his medications nor a list. Each medication was verbally reviewed with the patient and he was encouraged to bring the bottles to every visit to confirm accuracy of list.   Return in 3 months or sooner for any questions/problems before then.

## 2018-01-26 NOTE — Patient Instructions (Signed)
Continue weighing daily and call for an overnight weight gain of > 2 pounds or a weekly weight gain of >5 pounds. 

## 2018-03-01 ENCOUNTER — Ambulatory Visit: Payer: Medicare Other | Admitting: Gastroenterology

## 2018-03-01 ENCOUNTER — Encounter: Payer: Self-pay | Admitting: Gastroenterology

## 2018-03-01 DIAGNOSIS — D509 Iron deficiency anemia, unspecified: Secondary | ICD-10-CM

## 2018-03-05 ENCOUNTER — Other Ambulatory Visit: Payer: Self-pay

## 2018-03-07 ENCOUNTER — Other Ambulatory Visit: Payer: Self-pay | Admitting: Unknown Physician Specialty

## 2018-03-08 NOTE — Telephone Encounter (Signed)
Pt called regarding a refill on the atorvastatin. No answer, left message for him to call the office back to clarify if he would continue to see C. Julian Hy, NP or Dr. Doy Hutching for his refills.

## 2018-03-08 NOTE — Telephone Encounter (Signed)
Routing to close.  °

## 2018-03-08 NOTE — Telephone Encounter (Signed)
Refill request for statin approved.  Pt has next appointment with cardiology on 04/27/18, would benefit from labs at this visit.

## 2018-04-06 ENCOUNTER — Other Ambulatory Visit: Payer: Self-pay

## 2018-04-06 ENCOUNTER — Telehealth: Payer: Self-pay

## 2018-04-06 DIAGNOSIS — K2970 Gastritis, unspecified, without bleeding: Secondary | ICD-10-CM

## 2018-04-06 DIAGNOSIS — K3189 Other diseases of stomach and duodenum: Principal | ICD-10-CM

## 2018-04-06 NOTE — Telephone Encounter (Signed)
Spoke with pt and informed him that he is due for a repeat upper endoscopy procedure due to the biopsy results of his last procedure. Pt agrees and is aware that he'll receive printed procedure prep instructions via mail.

## 2018-04-19 ENCOUNTER — Encounter
Admission: RE | Disposition: A | Discharge status: Home / Self Care | Payer: Self-pay | Source: Ambulatory Visit | Attending: Gastroenterology

## 2018-04-19 ENCOUNTER — Ambulatory Visit: Payer: Medicare Other | Admitting: Certified Registered"

## 2018-04-19 ENCOUNTER — Ambulatory Visit
Admission: RE | Admit: 2018-04-19 | Discharge: 2018-04-19 | Disposition: A | Discharge status: Home / Self Care | Payer: Medicare Other | Source: Ambulatory Visit | Attending: Gastroenterology | Admitting: Gastroenterology

## 2018-04-19 DIAGNOSIS — I252 Old myocardial infarction: Secondary | ICD-10-CM | POA: Insufficient documentation

## 2018-04-19 DIAGNOSIS — G4730 Sleep apnea, unspecified: Secondary | ICD-10-CM | POA: Diagnosis not present

## 2018-04-19 DIAGNOSIS — Z79899 Other long term (current) drug therapy: Secondary | ICD-10-CM | POA: Diagnosis not present

## 2018-04-19 DIAGNOSIS — J449 Chronic obstructive pulmonary disease, unspecified: Secondary | ICD-10-CM | POA: Diagnosis not present

## 2018-04-19 DIAGNOSIS — Z95810 Presence of automatic (implantable) cardiac defibrillator: Secondary | ICD-10-CM | POA: Insufficient documentation

## 2018-04-19 DIAGNOSIS — K2970 Gastritis, unspecified, without bleeding: Secondary | ICD-10-CM

## 2018-04-19 DIAGNOSIS — I509 Heart failure, unspecified: Secondary | ICD-10-CM | POA: Insufficient documentation

## 2018-04-19 DIAGNOSIS — Z7902 Long term (current) use of antithrombotics/antiplatelets: Secondary | ICD-10-CM | POA: Diagnosis not present

## 2018-04-19 DIAGNOSIS — K219 Gastro-esophageal reflux disease without esophagitis: Secondary | ICD-10-CM | POA: Insufficient documentation

## 2018-04-19 DIAGNOSIS — Z7984 Long term (current) use of oral hypoglycemic drugs: Secondary | ICD-10-CM | POA: Insufficient documentation

## 2018-04-19 DIAGNOSIS — K3189 Other diseases of stomach and duodenum: Secondary | ICD-10-CM | POA: Diagnosis not present

## 2018-04-19 DIAGNOSIS — E119 Type 2 diabetes mellitus without complications: Secondary | ICD-10-CM | POA: Insufficient documentation

## 2018-04-19 DIAGNOSIS — I110 Hypertensive heart disease with heart failure: Secondary | ICD-10-CM | POA: Diagnosis not present

## 2018-04-19 DIAGNOSIS — Z955 Presence of coronary angioplasty implant and graft: Secondary | ICD-10-CM | POA: Insufficient documentation

## 2018-04-19 DIAGNOSIS — E785 Hyperlipidemia, unspecified: Secondary | ICD-10-CM | POA: Diagnosis not present

## 2018-04-19 DIAGNOSIS — F17200 Nicotine dependence, unspecified, uncomplicated: Secondary | ICD-10-CM | POA: Diagnosis not present

## 2018-04-19 DIAGNOSIS — Z09 Encounter for follow-up examination after completed treatment for conditions other than malignant neoplasm: Secondary | ICD-10-CM | POA: Diagnosis not present

## 2018-04-19 DIAGNOSIS — I2510 Atherosclerotic heart disease of native coronary artery without angina pectoris: Secondary | ICD-10-CM | POA: Insufficient documentation

## 2018-04-19 DIAGNOSIS — Z7982 Long term (current) use of aspirin: Secondary | ICD-10-CM | POA: Insufficient documentation

## 2018-04-19 HISTORY — PX: ESOPHAGOGASTRODUODENOSCOPY (EGD) WITH PROPOFOL: SHX5813

## 2018-04-19 LAB — GLUCOSE, CAPILLARY: Glucose-Capillary: 152 mg/dL — ABNORMAL HIGH (ref 70–99)

## 2018-04-19 SURGERY — ESOPHAGOGASTRODUODENOSCOPY (EGD) WITH PROPOFOL
Anesthesia: General

## 2018-04-19 MED ORDER — SODIUM CHLORIDE 0.9 % IV SOLN
INTRAVENOUS | Status: DC
  Administered 2018-04-19: 1000 mL via INTRAVENOUS

## 2018-04-19 MED ORDER — LIDOCAINE HCL (PF) 1 % IJ SOLN
INTRAMUSCULAR | Status: AC
  Filled 2018-04-19: qty 2

## 2018-04-19 MED ORDER — PROPOFOL 500 MG/50ML IV EMUL
INTRAVENOUS | Status: DC | PRN
  Administered 2018-04-19: 120 ug/kg/min via INTRAVENOUS

## 2018-04-19 MED ORDER — GLYCOPYRROLATE 0.2 MG/ML IJ SOLN
INTRAMUSCULAR | Status: AC
  Filled 2018-04-19: qty 1

## 2018-04-19 MED ORDER — LIDOCAINE 2% (20 MG/ML) 5 ML SYRINGE
INTRAMUSCULAR | Status: DC | PRN
  Administered 2018-04-19: 25 mg via INTRAVENOUS

## 2018-04-19 MED ORDER — PROPOFOL 10 MG/ML IV BOLUS
INTRAVENOUS | Status: DC | PRN
  Administered 2018-04-19: 30 mg via INTRAVENOUS
  Administered 2018-04-19: 70 mg via INTRAVENOUS

## 2018-04-19 NOTE — Anesthesia Preprocedure Evaluation (Addendum)
Anesthesia Evaluation  Patient identified by MRN, date of birth, ID band Patient awake    Reviewed: Allergy & Precautions, H&P , NPO status , Patient's Chart, lab work & pertinent test results  History of Anesthesia Complications Negative for: history of anesthetic complications  Airway Mallampati: III  TM Distance: <3 FB Neck ROM: limited    Dental  (+) Edentulous Upper, Edentulous Lower   Pulmonary shortness of breath and with exertion, sleep apnea , COPD, Current Smoker,           Cardiovascular Exercise Tolerance: Good hypertension, (-) angina+ CAD, + Past MI and +CHF  + pacemaker + Cardiac Defibrillator      Neuro/Psych negative neurological ROS  negative psych ROS   GI/Hepatic Neg liver ROS, GERD  Medicated and Controlled,  Endo/Other  diabetes, Type 2  Renal/GU negative Renal ROS  negative genitourinary   Musculoskeletal   Abdominal   Peds  Hematology negative hematology ROS (+)   Anesthesia Other Findings Past Medical History: No date: CAD (coronary artery disease)     Comment:  s/p stents in 2017 No date: CHF (congestive heart failure) ([facility]) No date: COPD (chronic obstructive pulmonary disease) ([facility]) No date: Diabetes ([place]) No date: GERD (gastroesophageal reflux disease) No date: Heart attack ([place]) No date: Hyperlipidemia No date: Hypertension No date: Presence of permanent cardiac pacemaker No date: Shortness of breath dyspnea No date: Sleep apnea  Past Surgical History: No date: cardiac stents No date: CARDIAC SURGERY     Comment:  defibrillator and pacemaker No date: CORONARY ANGIOPLASTY 09/19/2014: ESOPHAGOGASTRODUODENOSCOPY; N/A     Comment:  Procedure: ESOPHAGOGASTRODUODENOSCOPY (EGD);  Surgeon:               Lucilla Lame, MD;  Location: [facility] ENDOSCOPY;  Service:               Endoscopy;  Laterality: N/A; 09/28/2017: ESOPHAGOGASTRODUODENOSCOPY (EGD) WITH PROPOFOL; N/A     Comment:   Procedure: ESOPHAGOGASTRODUODENOSCOPY (EGD) WITH               PROPOFOL;  Surgeon: Virgel Manifold, MD;  Location:               [facility] ENDOSCOPY;  Service: Endoscopy;  Laterality: N/A;  BMI    Body Mass Index:  23.17 kg/m      Reproductive/Obstetrics negative OB ROS                             Anesthesia Physical Anesthesia Plan  ASA: IV  Anesthesia Plan: General   Post-op Pain Management:    Induction: Intravenous  PONV Risk Score and Plan: Propofol infusion and TIVA  Airway Management Planned: Natural Airway and Nasal Cannula  Additional Equipment:   Intra-op Plan:   Post-operative Plan:   Informed Consent: I have reviewed the patients History and Physical, chart, labs and discussed the procedure including the risks, benefits and alternatives for the proposed anesthesia with the patient or authorized representative who has indicated his/her understanding and acceptance.   Dental Advisory Given  Plan Discussed with: Anesthesiologist, CRNA and Surgeon  Anesthesia Plan Comments: (Patient consented for risks of anesthesia including but not limited to:  - adverse reactions to medications - risk of intubation if required - damage to teeth, lips or other oral mucosa - sore throat or hoarseness - Damage to heart, brain, lungs or loss of life  Patient voiced understanding.)       Anesthesia Quick Evaluation

## 2018-04-19 NOTE — H&P (Signed)
[name], MD [address], [place], [place], [place], [address] [address], [place], [place], [place], [address] Phone: [phone]  Fax: [phone]  Primary Care Physician:  Idelle Crouch, MD   Pre-Procedure History & Physical: HPI:  John Frank is a 62 y.o. male is here for an endoscopy    Past Medical History:  Diagnosis Date  . CAD (coronary artery disease)    s/p stents in 2017  . CHF (congestive heart failure) ([place])   . COPD (chronic obstructive pulmonary disease) ([place])   . Diabetes ([place])   . GERD (gastroesophageal reflux disease)   . Heart attack ([place])   . Hyperlipidemia   . Hypertension   . Presence of permanent cardiac pacemaker   . Shortness of breath dyspnea   . Sleep apnea     Past Surgical History:  Procedure Laterality Date  . cardiac stents    . CARDIAC SURGERY     defibrillator and pacemaker  . CORONARY ANGIOPLASTY    . ESOPHAGOGASTRODUODENOSCOPY N/A 09/19/2014   Procedure: ESOPHAGOGASTRODUODENOSCOPY (EGD);  Surgeon: Lucilla Lame, MD;  Location: [facility] ENDOSCOPY;  Service: Endoscopy;  Laterality: N/A;  . ESOPHAGOGASTRODUODENOSCOPY (EGD) WITH PROPOFOL N/A 09/28/2017   Procedure: ESOPHAGOGASTRODUODENOSCOPY (EGD) WITH PROPOFOL;  Surgeon: Virgel Manifold, MD;  Location: [facility] ENDOSCOPY;  Service: Endoscopy;  Laterality: N/A;    Prior to Admission medications   Medication Sig Start Date End Date Taking? Authorizing Provider  albuterol (PROVENTIL HFA;VENTOLIN HFA) 108 (90 Base) MCG/ACT inhaler Inhale 2 puffs into the lungs every 6 (six) hours as needed for wheezing or shortness of breath. 05/26/17  Yes Henreitta Leber, MD  aspirin EC 81 MG tablet Take 81 mg by mouth daily.   Yes [provider]  atorvastatin (LIPITOR) 40 MG tablet TAKE 1 TABLET (40 MG TOTAL) BY MOUTH DAILY. 03/08/18  Yes Cannady, Jolene T, NP  budesonide-formoterol (SYMBICORT) 160-4.5 MCG/ACT inhaler Inhale 2 puffs into the lungs 2 (two) times daily. 06/15/15  Yes  Kathrine Haddock, NP  buPROPion (WELLBUTRIN) 75 MG tablet Take 75 mg by mouth daily. 03/26/16 05/08/18 Yes [provider]  clopidogrel (PLAVIX) 75 MG tablet TAKE 1 TABLET (75 MG TOTAL) BY MOUTH DAILY. 12/08/16  Yes Kathrine Haddock, NP  enalapril (VASOTEC) 5 MG tablet Take 5 mg by mouth daily. 09/09/17  Yes [provider]  famotidine (PEPCID) 40 MG tablet TAKE 1 TABLET BY MOUTH EVERY DAY 09/09/17  Yes Wicker, Malachy Mood, NP  JANUVIA 100 MG tablet TAKE 1 TABLET (100 MG TOTAL) BY MOUTH DAILY. 09/14/17  Yes Kathrine Haddock, NP  metFORMIN (GLUCOPHAGE) 1000 MG tablet TAKE 1 TABLET (1,000 MG TOTAL) BY MOUTH 2 (TWO) TIMES DAILY WITH A MEAL. 09/12/16  Yes Kathrine Haddock, NP  metoprolol succinate (TOPROL-XL) 100 MG 24 hr tablet Take 100 mg by mouth.  08/19/17  Yes [provider]  ONE TOUCH ULTRA TEST test strip USE TO TEST BLOOD SUGAR ONCE DAILY 10/07/16  Yes Johnson, Megan P, DO  pantoprazole (PROTONIX) 40 MG tablet Take 1 tablet (40 mg total) by mouth every morning. Patient taking differently: Take 40 mg by mouth daily before breakfast.  06/29/17  Yes [name], MD  spironolactone (ALDACTONE) 25 MG tablet Take 0.5 tablets (12.5 mg total) by mouth daily. 12/29/17  Yes Darylene Price A, FNP  sucralfate (CARAFATE) 1 g tablet Take 1 g by mouth 4 (four) times daily -  with meals and at bedtime.  05/15/17 05/15/18 Yes [provider]  albuterol (  PROVENTIL HFA;VENTOLIN HFA) 108 (90 Base) MCG/ACT inhaler Inhale 2 puffs into the lungs every 6 (six) hours as needed for wheezing or shortness of breath. Patient not taking: Reported on 01/26/2018 01/14/18   Versie Starks, PA-C  albuterol (PROVENTIL) (2.5 MG/3ML) 0.083% nebulizer solution Take 2.5 mg by nebulization every 6 (six) hours as needed for wheezing or shortness of breath.    [provider]  ferrous sulfate (FERROUSUL) 325 (65 FE) MG tablet Take 1 tablet (325 mg total) by mouth daily with breakfast. Patient not taking: Reported on  04/19/2018 11/02/17 01/27/19  [name], MD  furosemide (LASIX) 40 MG tablet Take 1 tablet (40 mg total) by mouth daily. 08/11/17   Loletha Grayer, MD  ipratropium-albuterol (DUONEB) 0.5-2.5 (3) MG/3ML SOLN Take 3 mLs by nebulization every 6 (six) hours as needed (wheezing; shortness of breath).    [provider]    Allergies as of 04/06/2018  . (No Known Allergies)    Family History  Problem Relation Age of Onset  . Diabetes Mother   . Heart attack Father 71    Social History   Socioeconomic History  . Marital status: Single    Spouse name: Not on file  . Number of children: Not on file  . Years of education: Not on file  . Highest education level: Not on file  Occupational History  . Not on file  Social Needs  . Financial resource strain: Not on file  . Food insecurity:    Worry: Not on file    Inability: Not on file  . Transportation needs:    Medical: Not on file    Non-medical: Not on file  Tobacco Use  . Smoking status: Current Every Day Smoker    Packs/day: 1.00    Years: 40.00    Pack years: 40.00    Types: Cigarettes  . Smokeless tobacco: Former Network engineer and Sexual Activity  . Alcohol use: No  . Drug use: Never  . Sexual activity: Yes  Lifestyle  . Physical activity:    Days per week: Not on file    Minutes per session: Not on file  . Stress: Not on file  Relationships  . Social connections:    Talks on phone: Not on file    Gets together: Not on file    Attends religious service: Not on file    Active member of club or organization: Not on file    Attends meetings of clubs or organizations: Not on file    Relationship status: Not on file  . Intimate partner violence:    Fear of current or ex partner: Not on file    Emotionally abused: Not on file    Physically abused: Not on file    Forced sexual activity: Not on file  Other Topics Concern  . Not on file  Social History Narrative   Independent at baseline.  Lives at home with  his family    Review of Systems: See HPI, otherwise negative ROS  Physical Exam: BP 134/67   Temp (!) 97.1 F (36.2 C) (Tympanic)   Resp 18   Ht 5\' 4"  (1.626 m)   Wt 61.2 kg   SpO2 96%   BMI 23.17 kg/m  General:   Alert,  pleasant and cooperative in NAD Head:  Normocephalic and atraumatic. Neck:  Supple; no masses or thyromegaly. Lungs:  Clear throughout to auscultation, normal respiratory effort.    Heart:  +S1, +S2, Regular rate and rhythm,  No edema. Abdomen:  Soft, nontender and nondistended. Normal bowel sounds, without guarding, and without rebound.   Neurologic:  Alert and  oriented x4;  grossly normal neurologically.  Impression/Plan: John Frank is here for an endoscopy  to be performed for  evaluation of gastric mapping for gastric intestinal metaplasia    Risks, benefits, limitations, and alternatives regarding endoscopy have been reviewed with the patient.  Questions have been answered.  All parties agreeable.   [name], MD  04/19/2018, 9:33 AM

## 2018-04-19 NOTE — Anesthesia Postprocedure Evaluation (Signed)
Anesthesia Post Note  Patient: John Frank  Procedure(s) Performed: ESOPHAGOGASTRODUODENOSCOPY (EGD) WITH PROPOFOL with Gastric Mapping (N/A )  Patient location during evaluation: Endoscopy Anesthesia Type: General Level of consciousness: awake and alert Pain management: pain level controlled Vital Signs Assessment: post-procedure vital signs reviewed and stable Respiratory status: spontaneous breathing, nonlabored ventilation, respiratory function stable and patient connected to nasal cannula oxygen Cardiovascular status: blood pressure returned to baseline and stable Postop Assessment: no apparent nausea or vomiting Anesthetic complications: no     Last Vitals:  Vitals:   04/19/18 0959 04/19/18 1019  BP: 118/75 116/69  Pulse:    Resp:    Temp:    SpO2:      Last Pain:  Vitals:   04/19/18 1019  TempSrc:   PainSc: 0-No pain                 Precious Haws [name]

## 2018-04-19 NOTE — OR Nursing (Signed)
Food seen in stomach, unable to do gastric mapping.

## 2018-04-19 NOTE — Op Note (Signed)
[facility] Gastroenterology Patient Name: John Frank Procedure Date: 04/19/2018 9:29 AM MRN: [id number] Account #: [id number] Date of Birth: Apr 06, 1956 Admit Type: Outpatient Age: 62 Room: [facility] ENDO ROOM 4 Gender: Male Note Status: Finalized Procedure:            Upper GI endoscopy Indications:          Follow-up of gastric intestinal metaplasia with                        dysplasia Providers:            [name] MD, MD Referring MD:         Leonie Douglas. Doy Hutching, MD (Referring MD) Medicines:            Monitored Anesthesia Care Complications:        No immediate complications. Procedure:            Pre-Anesthesia Assessment:                       - Prior to the procedure, a History and Physical was                        performed, and patient medications, allergies and                        sensitivities were reviewed. The patient's tolerance of                        previous anesthesia was reviewed.                       - The risks and benefits of the procedure and the                        sedation options and risks were discussed with the                        patient. All questions were answered and informed                        consent was obtained.                       - ASA Grade Assessment: III - A patient with severe                        systemic disease.                       After obtaining informed consent, the endoscope was                        passed under direct vision. Throughout the procedure,                        the patient's blood pressure, pulse, and oxygen                        saturations were monitored continuously. The Endoscope  was introduced through the mouth, and advanced to the                        third part of duodenum. The upper GI endoscopy was                        accomplished without difficulty. The patient tolerated                        the procedure well. Findings:      The  esophagus was normal.      The examined duodenum was normal.      A large amount of food (residue) was found in the gastric body.      mucosa not able to be examined due to food in the stomach Impression:           - Normal esophagus.                       - Normal examined duodenum.                       - A large amount of food (residue) in the stomach.                       - No specimens collected. Recommendation:       - Discharge patient to home (with escort).                       - Resume previous diet.                       - Continue present medications.                       - Needs repeat EGD after 24 hours of clears, adherance                        to instructions, he is also in need for a colonoscopy                        previously not done due to non compliance to                        instructions. Suggest both to be scheduled at the same                        time. Procedure Code(s):    --- Professional ---                       [id number], Esophagogastroduodenoscopy, flexible, transoral;                        diagnostic, including collection of specimen(s) by                        brushing or washing, when performed (separate procedure) Diagnosis Code(s):    --- Professional ---                       K31.89, Other diseases of stomach and duodenum CPT copyright 2018  American Medical Association. All rights reserved. The codes documented in this report are preliminary and upon coder review may  be revised to meet current compliance requirements. [name], MD [name] MD, MD 04/19/2018 9:47:32 AM This report has been signed electronically. Number of Addenda: 0 Note Initiated On: 04/19/2018 9:29 AM      [facility]

## 2018-04-19 NOTE — Transfer of Care (Signed)
Immediate Anesthesia Transfer of Care Note  Patient: John Frank  Procedure(s) Performed: ESOPHAGOGASTRODUODENOSCOPY (EGD) WITH PROPOFOL with Gastric Mapping (N/A )  Patient Location: Endoscopy Unit  Anesthesia Type:General  Level of Consciousness: awake  Airway & Oxygen Therapy: Patient Spontanous Breathing and Patient connected to nasal cannula oxygen  Post-op Assessment: Report given to RN and Post -op Vital signs reviewed and stable  Post vital signs: Reviewed  Last Vitals:  Vitals Value Taken Time  BP 104/64 04/19/2018  9:50 AM  Temp 36.2 C 04/19/2018  9:50 AM  Pulse 82 04/19/2018  9:50 AM  Resp 20 04/19/2018  9:50 AM  SpO2 97 % 04/19/2018  9:50 AM    Last Pain:  Vitals:   04/19/18 0949  TempSrc: Tympanic  PainSc: Asleep         Complications: No apparent anesthesia complications

## 2018-04-19 NOTE — Anesthesia Post-op Follow-up Note (Signed)
Anesthesia QCDR form completed.        

## 2018-04-25 NOTE — Progress Notes (Signed)
Patient ID: John Frank, male    DOB: [birth year], 62 y.o.   MRN: [id number]  HPI  Mr Chevalier is a 62 y/o male with a history of CAD, DM, hyperlipidemia, HTN, GERD, COPD, obstructive sleep apnea, chronic heart failure and current tobacco use.   Echo report from 04/13/17 reviewed and showed an EF of 30% along with mild MR/TR.  Was in the ED 01/14/18 due to acute bronchitis where he was treated and released. Admitted 08/09/17 due to acute on chronic HF. Initially needed IV Lasix and then transitioned to oral diuretics. Paracentesis was done due to ascites and antibiotics were also given. Discharged after 2 days.   He presents today for a follow-up visit with a chief complaint of minimal shortness of breath upon moderate exertion. He describes this as chronic in nature having been present for several years. He has associated cough, chest pain, light-headedness and gradual weight gain along with this. He denies any difficulty sleeping, abdominal distention, palpitations, pedal edema or fatigue.   Past Medical History:  Diagnosis Date  . CAD (coronary artery disease)    s/p stents in 2017  . CHF (congestive heart failure) ([place])   . COPD (chronic obstructive pulmonary disease) ([place])   . Diabetes ([place])   . GERD (gastroesophageal reflux disease)   . Heart attack ([place])   . Hyperlipidemia   . Hypertension   . Presence of permanent cardiac pacemaker   . Shortness of breath dyspnea   . Sleep apnea    Past Surgical History:  Procedure Laterality Date  . cardiac stents    . CARDIAC SURGERY     defibrillator and pacemaker  . CORONARY ANGIOPLASTY    . ESOPHAGOGASTRODUODENOSCOPY N/A 09/19/2014   Procedure: ESOPHAGOGASTRODUODENOSCOPY (EGD);  Surgeon: Lucilla Lame, MD;  Location: [facility] ENDOSCOPY;  Service: Endoscopy;  Laterality: N/A;  . ESOPHAGOGASTRODUODENOSCOPY (EGD) WITH PROPOFOL N/A 09/28/2017   Procedure: ESOPHAGOGASTRODUODENOSCOPY (EGD) WITH PROPOFOL;  Surgeon: Virgel Manifold, MD;  Location:  [facility] ENDOSCOPY;  Service: Endoscopy;  Laterality: N/A;  . ESOPHAGOGASTRODUODENOSCOPY (EGD) WITH PROPOFOL N/A 04/19/2018   Procedure: ESOPHAGOGASTRODUODENOSCOPY (EGD) WITH PROPOFOL with Gastric Mapping;  Surgeon: [name], MD;  Location: [facility] ENDOSCOPY;  Service: Gastroenterology;  Laterality: N/A;   Family History  Problem Relation Age of Onset  . Diabetes Mother   . Heart attack Father 4   Social History   Tobacco Use  . Smoking status: Current Every Day Smoker    Packs/day: 1.00    Years: 40.00    Pack years: 40.00    Types: Cigarettes  . Smokeless tobacco: Former Network engineer Use Topics  . Alcohol use: No   No Known Allergies  Prior to Admission medications   Medication Sig Start Date End Date Taking? Authorizing Provider  albuterol (PROVENTIL HFA;VENTOLIN HFA) 108 (90 Base) MCG/ACT inhaler Inhale 2 puffs into the lungs every 6 (six) hours as needed for wheezing or shortness of breath. 05/26/17  Yes Henreitta Leber, MD  albuterol (PROVENTIL) (2.5 MG/3ML) 0.083% nebulizer solution Take 2.5 mg by nebulization every 6 (six) hours as needed for wheezing or shortness of breath.   Yes [provider]  aspirin EC 81 MG tablet Take 81 mg by mouth daily.   Yes [provider]  atorvastatin (LIPITOR) 40 MG tablet TAKE 1 TABLET (40 MG TOTAL) BY MOUTH DAILY. 03/08/18  Yes Cannady, Jolene T, NP  budesonide-formoterol (SYMBICORT) 160-4.5 MCG/ACT inhaler Inhale 2 puffs into the lungs 2 (two) times daily. 06/15/15  Yes Wicker,  Malachy Mood, NP  buPROPion (WELLBUTRIN) 75 MG tablet Take 75 mg by mouth daily. 03/26/16 05/08/18 Yes [provider]  clopidogrel (PLAVIX) 75 MG tablet TAKE 1 TABLET (75 MG TOTAL) BY MOUTH DAILY. 12/08/16  Yes Kathrine Haddock, NP  famotidine (PEPCID) 40 MG tablet TAKE 1 TABLET BY MOUTH EVERY DAY 09/09/17  Yes Kathrine Haddock, NP  furosemide (LASIX) 40 MG tablet Take 1 tablet (40 mg total) by mouth daily. 08/11/17  Yes Wieting, Richard, MD   ipratropium-albuterol (DUONEB) 0.5-2.5 (3) MG/3ML SOLN Take 3 mLs by nebulization every 6 (six) hours as needed (wheezing; shortness of breath).   Yes [provider]  JANUVIA 100 MG tablet TAKE 1 TABLET (100 MG TOTAL) BY MOUTH DAILY. 09/14/17  Yes Kathrine Haddock, NP  lisinopril (PRINIVIL,ZESTRIL) 20 MG tablet Take 20 mg by mouth daily.   Yes [provider]  metFORMIN (GLUCOPHAGE) 1000 MG tablet TAKE 1 TABLET (1,000 MG TOTAL) BY MOUTH 2 (TWO) TIMES DAILY WITH A MEAL. 09/12/16  Yes Kathrine Haddock, NP  metoprolol succinate (TOPROL-XL) 100 MG 24 hr tablet Take 100 mg by mouth daily.  08/19/17  Yes [provider]  ONE TOUCH ULTRA TEST test strip USE TO TEST BLOOD SUGAR ONCE DAILY 10/07/16  Yes Johnson, Megan P, DO  pantoprazole (PROTONIX) 40 MG tablet Take 1 tablet (40 mg total) by mouth every morning. Patient taking differently: Take 40 mg by mouth daily before breakfast.  06/29/17  Yes [name], MD  spironolactone (ALDACTONE) 25 MG tablet Take 0.5 tablets (12.5 mg total) by mouth daily. 12/29/17  Yes Darylene Price A, FNP  sucralfate (CARAFATE) 1 g tablet Take 1 g by mouth 4 (four) times daily -  with meals and at bedtime.  05/15/17 05/15/18 Yes [provider]    Review of Systems  Constitutional: Negative for appetite change, fatigue and fever.  HENT: Negative for congestion, postnasal drip and sore throat.   Eyes: Negative.   Respiratory: Positive for cough and shortness of breath. Negative for chest tightness and wheezing.   Cardiovascular: Positive for chest pain (at times with exertion). Negative for palpitations and leg swelling.  Gastrointestinal: Negative for abdominal distention and abdominal pain.  Endocrine: Negative.   Genitourinary: Negative.   Musculoskeletal: Negative for back pain and neck pain.  Skin: Negative.   Allergic/Immunologic: Negative.   Neurological: Positive for light-headedness (at times). Negative for dizziness and headaches.   Hematological: Negative for adenopathy. Does not bruise/bleed easily.  Psychiatric/Behavioral: Negative for dysphoric mood and sleep disturbance (wearing CPAP ). The patient is not nervous/anxious.    Vitals:   04/27/18 0930  BP: 130/84  Pulse: 88  Resp: 18  SpO2: 94%  Weight: 157 lb (71.2 kg)  Height: 5\' 4"  (1.626 m)   Wt Readings from Last 3 Encounters:  04/27/18 157 lb (71.2 kg)  04/19/18 135 lb (61.2 kg)  01/26/18 149 lb (67.6 kg)   Lab Results  Component Value Date   CREATININE 0.91 01/26/2018   CREATININE 1.13 09/18/2017   CREATININE 0.96 08/11/2017   Physical Exam  Constitutional: He is oriented to person, place, and time. He appears well-developed and well-nourished.  HENT:  Head: Normocephalic and atraumatic.  Neck: Normal range of motion. Neck supple. No JVD present.  Cardiovascular: Normal rate and regular rhythm.  Pulmonary/Chest: Effort normal. He has no wheezes. He has rhonchi in the right lower field and the left lower field. He has no rales.  Abdominal: Soft. He exhibits no distension. There is no abdominal tenderness.  Musculoskeletal:        General: No tenderness or edema.  Neurological: He is alert and oriented to person, place, and time.  Skin: Skin is warm and dry.  Psychiatric: He has a normal mood and affect. His behavior is normal. Thought content normal.  Nursing note and vitals reviewed.  Assessment & Plan:  1: Chronic heart failure with reduced ejection fraction- - NYHA class II - euvolemic today - weighing daily and he was reminded to call for an overnight weight gain of >2 pounds or a weekly weight gain of >5 pounds.  - weight up 8 pounds from last visit 3 months ago - not adding salt; using Mrs. Dash; admits that he's not reading food labels; reminded him to keep his daily sodium intake to 2000mg  a day. Does eat quite a bit of processed food; rinses canned vegetables out - currently drinking a 1/2L soda, ~20 ounces of tea and 2-3 cups of  coffee daily. Drinking 1 bottle of water daily - saw cardiology Clayborn Bigness) 08/19/17 - saw pulmonologist Raul Del) 06/22/17 - BNP 08/09/17 was 756.0 - currently has pacemaker/defibrillator  - patient uses a weekly pill box; advised him to finish his current pill box with lisinopril in it, skip one day and then begin entresto 24/26mg  twice daily. Voucher given to patient to take to the pharmacy to get the first 30 days at no charge - will check BMP at his next visit - PharmD reconciled medications with the patient  2: HTN- - BP looks good today - saw PCP ([place]) 10/12/17 - BMP 01/26/18 reviewed and showed sodium 139, potassium 4.4, creatinine 0.91 and GFR >60  3: Diabetes- - hasn't checked his glucose in a few days - A1c 08/09/17 was 7.1%  4: Tobacco use- - currently smoking 1-2 cigarettes daily  - complete cessation discussed for 3 minutes with patient  Patient did not bring his medications nor a list. Each medication was verbally reviewed with the patient and he was encouraged to bring the bottles to every visit to confirm accuracy of list. Emphasized the importance of bringing his medication bottles to very visit.  Return in 6 weeks or sooner for any questions/problems before then.

## 2018-04-27 ENCOUNTER — Encounter: Payer: Self-pay | Admitting: Family

## 2018-04-27 ENCOUNTER — Ambulatory Visit: Payer: Medicare Other | Attending: Family | Admitting: Family

## 2018-04-27 VITALS — BP 130/84 | HR 88 | Resp 18 | Ht 64.0 in | Wt 157.0 lb

## 2018-04-27 DIAGNOSIS — G4733 Obstructive sleep apnea (adult) (pediatric): Secondary | ICD-10-CM | POA: Diagnosis not present

## 2018-04-27 DIAGNOSIS — Z9889 Other specified postprocedural states: Secondary | ICD-10-CM | POA: Diagnosis not present

## 2018-04-27 DIAGNOSIS — K219 Gastro-esophageal reflux disease without esophagitis: Secondary | ICD-10-CM | POA: Diagnosis not present

## 2018-04-27 DIAGNOSIS — Z950 Presence of cardiac pacemaker: Secondary | ICD-10-CM | POA: Diagnosis not present

## 2018-04-27 DIAGNOSIS — Z7984 Long term (current) use of oral hypoglycemic drugs: Secondary | ICD-10-CM | POA: Diagnosis not present

## 2018-04-27 DIAGNOSIS — I5022 Chronic systolic (congestive) heart failure: Secondary | ICD-10-CM | POA: Insufficient documentation

## 2018-04-27 DIAGNOSIS — I10 Essential (primary) hypertension: Secondary | ICD-10-CM

## 2018-04-27 DIAGNOSIS — I252 Old myocardial infarction: Secondary | ICD-10-CM | POA: Insufficient documentation

## 2018-04-27 DIAGNOSIS — E119 Type 2 diabetes mellitus without complications: Secondary | ICD-10-CM

## 2018-04-27 DIAGNOSIS — F17210 Nicotine dependence, cigarettes, uncomplicated: Secondary | ICD-10-CM | POA: Diagnosis not present

## 2018-04-27 DIAGNOSIS — E785 Hyperlipidemia, unspecified: Secondary | ICD-10-CM | POA: Insufficient documentation

## 2018-04-27 DIAGNOSIS — I2510 Atherosclerotic heart disease of native coronary artery without angina pectoris: Secondary | ICD-10-CM | POA: Insufficient documentation

## 2018-04-27 DIAGNOSIS — Z79899 Other long term (current) drug therapy: Secondary | ICD-10-CM | POA: Insufficient documentation

## 2018-04-27 DIAGNOSIS — I110 Hypertensive heart disease with heart failure: Secondary | ICD-10-CM | POA: Insufficient documentation

## 2018-04-27 DIAGNOSIS — Z720 Tobacco use: Secondary | ICD-10-CM

## 2018-04-27 DIAGNOSIS — R0602 Shortness of breath: Secondary | ICD-10-CM | POA: Insufficient documentation

## 2018-04-27 DIAGNOSIS — Z8249 Family history of ischemic heart disease and other diseases of the circulatory system: Secondary | ICD-10-CM | POA: Diagnosis not present

## 2018-04-27 DIAGNOSIS — Z7982 Long term (current) use of aspirin: Secondary | ICD-10-CM | POA: Insufficient documentation

## 2018-04-27 DIAGNOSIS — J440 Chronic obstructive pulmonary disease with acute lower respiratory infection: Secondary | ICD-10-CM | POA: Insufficient documentation

## 2018-04-27 MED ORDER — SACUBITRIL-VALSARTAN 24-26 MG PO TABS: 1.0000 | ORAL_TABLET | Freq: Two times a day (BID) | ORAL | 3 refills | Status: DC

## 2018-04-27 NOTE — Progress Notes (Signed)
John Frank COUNSELING NOTE   ASSESSMENT   John Frank is a 55 yom who presents to the heart failure clinic for routine follow up. He has not been having any issues with his medications.   Adherence assessment   Patient is using pillbox as an adherence strategy.    Do you ever forget to take your medication? [x] Yes (1) [] No (0)  Do you ever skip doses due to side effects? [] Yes (1) [x] No (0)  Do you have trouble affording your medicines? [] Yes (1) [x] No (0)  Are you ever unable to pick up your medication due to transportation difficulties? [] Yes (1) [x] No (0)  Do you ever stop taking your medications because you don't believe they are helping? [x] Yes (1) [] No (0)  Total score _2_____       Guideline-Directed Medical Therapy/Evidence Based Medicine   ACE/ARB/ARNI: Yes   Beta Blocker: Yes   Aldosterone Antagonist: Yes Diuretic: Yes   Drug-related Problem 1: Adherence. Patient reports stopping one of his heart medicines (Lipitor?) for a few weeks because he wasn't sure it was working. He did resume the medicine after speaking with his cardiologist. He also reports sometimes forgetting to take his medicines. When NP went to visit patient she realized patient takes lisinopril and not enalapril. She is switching him to [place] today and instructed him to finish what he has set up in pill box before starting Entresto.    PLAN   DRP1: I reviewed the heart failure medicines with him and emphasized the role of the drugs in reducing symptoms, keeping him out of the hospital, and improving survival.    SUBJECTIVE   HPI: Routine office visit   Past Medical History:  Diagnosis Date  . CAD (coronary artery disease)    s/p stents in 2017  . CHF (congestive heart failure) ([place])   . COPD (chronic obstructive pulmonary disease) ([place])   . Diabetes ([place])   . GERD (gastroesophageal reflux disease)   . Heart attack ([place])   .  Hyperlipidemia   . Hypertension   . Presence of permanent cardiac pacemaker   . Shortness of breath dyspnea   . Sleep apnea       Current Outpatient Medications:  .  albuterol (PROVENTIL HFA;VENTOLIN HFA) 108 (90 Base) MCG/ACT inhaler, Inhale 2 puffs into the lungs every 6 (six) hours as needed for wheezing or shortness of breath., Disp: 1 Inhaler, Rfl: 2 .  albuterol (PROVENTIL) (2.5 MG/3ML) 0.083% nebulizer solution, Take 2.5 mg by nebulization every 6 (six) hours as needed for wheezing or shortness of breath., Disp: , Rfl:  .  aspirin EC 81 MG tablet, Take 81 mg by mouth daily., Disp: , Rfl:  .  atorvastatin (LIPITOR) 40 MG tablet, TAKE 1 TABLET (40 MG TOTAL) BY MOUTH DAILY., Disp: 90 tablet, Rfl: 1 .  budesonide-formoterol (SYMBICORT) 160-4.5 MCG/ACT inhaler, Inhale 2 puffs into the lungs 2 (two) times daily., Disp: 1 Inhaler, Rfl: 3 .  buPROPion (WELLBUTRIN) 75 MG tablet, Take 75 mg by mouth daily., Disp: , Rfl:  .  clopidogrel (PLAVIX) 75 MG tablet, TAKE 1 TABLET (75 MG TOTAL) BY MOUTH DAILY., Disp: 90 tablet, Rfl: 3 .  enalapril (VASOTEC) 5 MG tablet, Take 5 mg by mouth daily., Disp: , Rfl: 1 .  famotidine (PEPCID) 40 MG tablet, TAKE 1 TABLET BY MOUTH EVERY DAY, Disp: 90 tablet, Rfl: 3 .  furosemide (LASIX) 40 MG tablet, Take 1 tablet (40 mg total) by mouth  daily., Disp: 30 tablet, Rfl: 0 .  ipratropium-albuterol (DUONEB) 0.5-2.5 (3) MG/3ML SOLN, Take 3 mLs by nebulization every 6 (six) hours as needed (wheezing; shortness of breath)., Disp: , Rfl:  .  JANUVIA 100 MG tablet, TAKE 1 TABLET (100 MG TOTAL) BY MOUTH DAILY., Disp: 90 tablet, Rfl: 1 .  metFORMIN (GLUCOPHAGE) 1000 MG tablet, TAKE 1 TABLET (1,000 MG TOTAL) BY MOUTH 2 (TWO) TIMES DAILY WITH A MEAL., Disp: 180 tablet, Rfl: 1 .  metoprolol succinate (TOPROL-XL) 100 MG 24 hr tablet, Take 100 mg by mouth daily. , Disp: , Rfl:  .  ONE TOUCH ULTRA TEST test strip, USE TO TEST BLOOD SUGAR ONCE DAILY, Disp: 100 each, Rfl: 3 .   pantoprazole (PROTONIX) 40 MG tablet, Take 1 tablet (40 mg total) by mouth every morning. (Patient taking differently: Take 40 mg by mouth daily before breakfast. ), Disp: 90 tablet, Rfl: 1 .  spironolactone (ALDACTONE) 25 MG tablet, Take 0.5 tablets (12.5 mg total) by mouth daily., Disp: 30 tablet, Rfl: 3 .  sucralfate (CARAFATE) 1 g tablet, Take 1 g by mouth 4 (four) times daily -  with meals and at bedtime. , Disp: , Rfl:  .  ferrous sulfate (FERROUSUL) 325 (65 FE) MG tablet, Take 1 tablet (325 mg total) by mouth daily with breakfast. (Patient not taking: Reported on 04/19/2018), Disp: 61 tablet, Rfl: 0    OBJECTIVE    BMP Latest Ref Rng & Units 01/26/2018 09/18/2017 08/11/2017  Glucose 70 - 99 mg/dL 155(H) 120(H) 124(H)  BUN 8 - 23 mg/dL 12 20 17   Creatinine 0.61 - 1.24 mg/dL 0.91 1.13 0.96  BUN/Creat Ratio 10 - 24 - - -  Sodium 135 - 145 mmol/L 139 137 139  Potassium 3.5 - 5.1 mmol/L 4.4 4.3 4.2  Chloride 98 - 111 mmol/L 100 97(L) 98(L)  CO2 22 - 32 mmol/L 29 30 33(H)  Calcium 8.9 - 10.3 mg/dL 9.8 9.7 8.5(L)      ECHO: Date 04/13/2017, EF 30%,   Cath: Date N/A, EF N/A, notes N/A   DRUGS TO AVOID IN HEART FAILURE  Drug or Class Mechanism  Analgesics . NSAIDs . COX-2 inhibitors . Glucocorticoids  Sodium and water retention, increased systemic vascular resistance, decreased response to diuretics   Diabetes Medications . Metformin . Thiazolidinediones o Rosiglitazone (Avandia) o Pioglitazone (Actos) . DPP4 Inhibitors o Saxagliptin (Onglyza) o Sitagliptin (Januvia)   Lactic acidosis Possible calcium channel blockade   Unknown  Antiarrhythmics . Class I  o Flecainide o Disopyramide . Class III o Sotalol . Other o Dronedarone  Negative inotrope, proarrhythmic   Proarrhythmic, beta blockade  Negative inotrope  Antihypertensives . Alpha Blockers o Doxazosin . Calcium Channel Blockers o Diltiazem o Verapamil o Nifedipine . Central Alpha  Adrenergics o Moxonidine . Peripheral Vasodilators o Minoxidil  Increases renin and aldosterone  Negative inotrope    Possible sympathetic withdrawal  Unknown  Anti-infective . Itraconazole . Amphotericin B  Negative inotrope Unknown  Hematologic . Anagrelide . Cilostazol   Possible inhibition of PD IV Inhibition of PD III causing arrhythmias  Neurologic/Psychiatric . Stimulants . Anti-Seizure Drugs o Carbamazepine o Pregabalin . Antidepressants o Tricyclics o Citalopram . Parkinsons o Bromocriptine o Pergolide o Pramipexole . Antipsychotics o Clozapine . Antimigraine o Ergotamine o Methysergide . Appetite suppressants . Bipolar o Lithium  Peripheral alpha and beta agonist activity  Negative inotrope and chronotrope Calcium channel blockade  Negative inotrope, proarrhythmic Dose-dependent QT prolongation  Excessive serotonin activity/valvular damage Excessive serotonin activity/valvular  damage Unknown  IgE mediated hypersensitivy, calcium channel blockade  Excessive serotonin activity/valvular damage Excessive serotonin activity/valvular damage Valvular damage  Direct myofibrillar degeneration, adrenergic stimulation  Antimalarials . Chloroquine . Hydroxychloroquine Intracellular inhibition of lysosomal enzymes  Urologic Agents . Alpha Blockers o Doxazosin o Prazosin o Tamsulosin o Terazosin  Increased renin and aldosterone  Adapted from Page RL, et al. "Drugs That May Cause or Exacerbate Heart Failure: A Scientific Statement from the [place]." Circulation 2016; 308:M57-Q46. DOI: 10.1161/CIR.0000000000000426   COUNSELING POINTS/CLINICAL PEARLS  Metoprolol Succinate (Goal: 200 mg once daily)  Warn patient to avoid activities requiring mental alertness or coordination until drug effects are realized, as drug may cause dizziness. Tell patient planning major surgery with anesthesia to alert physician that drug is being  used, as drug impairs ability of heart to respond to reflex adrenergic stimuli. Drug may cause diarrhea, fatigue, headache, or depression. Advise diabetic patient to carefully monitor blood glucose as drug may mask symptoms of hypoglycemia. Patient should take extended-release tablet with or immediately following meals. Counsel patient against sudden discontinuation of drug, as this may precipitate hypertension, angina, or myocardial infarction. In the event of a missed dose, counsel patient to skip the missed dose and maintain a regular dosing schedule.  Lisinopril (Goal: 20 to 40 mg once daily)  This drug may cause nausea, vomiting, dizziness, headache, or angioedema of face, lips, throat, or intestines.  Instruct patient to report signs/symptoms of hypotension, or a persistent cough.  Advise patient against sudden discontinuation of drug. Side effects may include abdominal pain, diarrhea, hypotension, headache, cough, or fatigue.  Advise patient to avoid potassium supplements and foods/salt substitutes that are high in potassium.  Furosemide  Drug causes sun-sensitivity. Advise patient to use sunscreen and avoid tanning beds. Patient should avoid activities requiring coordination until drug effects are realized, as drug may cause dizziness, vertigo, or blurred vision. This drug may cause hyperglycemia, hyperuricemia, constipation, diarrhea, loss of appetite, nausea, vomiting, purpuric disorder, cramps, spasticity, asthenia, headache, paresthesia, or scaling eczema. Instruct patient to report unusual bleeding/bruising or signs/symptoms of hypotension, infection, pancreatitis, or ototoxicity (tinnitus, hearing impairment). Advise patient to report signs/symptoms of a severe skin reactions (flu-like symptoms, spreading red rash, or skin/mucous membrane blistering) or erythema multiforme. Instruct patient to eat high-potassium foods during drug therapy, as directed by healthcare professional.   Patient should not drink alcohol while taking this drug.  Spironolactone  Warn patient to report dehydration, hypotension, or symptoms of worsening renal function.  Counsel male patient to report gynecomastia.  Side effects may include diarrhea, nausea, vomiting, abdominal cramping, fever, leg cramps, lethargy, mental confusion, decreased libido, irregular menses, and rash. Suspension: Tell patient to take drug consistently with respect to food, either before or after a meal.  Advise patient to avoid potassium supplements and foods containing high levels of potassium, including salt substitutes.   MEDICATION ADHERENCES TIPS AND STRATEGIES 1. Taking medication as prescribed improves patient outcomes in heart failure (reduces hospitalizations, improves symptoms, increases survival) 2. Side effects of medications can be managed by decreasing doses, switching agents, stopping drugs, or adding additional therapy. Please let someone in the [facility] know if you have having bothersome side effects so we can modify your regimen. Do not alter your medication regimen without talking to [place].  3. Medication reminders can help patients remember to take drugs on time. If you are missing or forgetting doses you can try linking behaviors, using pill boxes, or an electronic reminder like an alarm on  your phone or an app. Some people can also get automated phone calls as medication reminders.   Time spent: 10 minutes  Laural Benes, Pharm.D. Clinical Pharmacist 04/27/2018

## 2018-04-27 NOTE — Patient Instructions (Addendum)
Continue weighing daily and call for an overnight weight gain of > 2 pounds or a weekly weight gain of >5 pounds.  Finish your pill box of medications. Take all your medications except lisinopril when you refill your pill box. Wait one day and then begin taking entresto 1 tablet twice daily.   Bring medication bottles to every appointment.

## 2018-05-03 ENCOUNTER — Other Ambulatory Visit: Payer: Self-pay | Admitting: Unknown Physician Specialty

## 2018-05-04 ENCOUNTER — Ambulatory Visit: Payer: Medicare Other | Admitting: Gastroenterology

## 2018-05-25 ENCOUNTER — Other Ambulatory Visit: Payer: Self-pay

## 2018-05-25 ENCOUNTER — Inpatient Hospital Stay
Admission: EM | Admit: 2018-05-25 | Discharge: 2018-05-28 | DRG: 193 | Disposition: A | Discharge status: Home Health | Payer: Medicare HMO | Attending: Internal Medicine | Admitting: Internal Medicine

## 2018-05-25 ENCOUNTER — Emergency Department: Payer: Medicare HMO

## 2018-05-25 DIAGNOSIS — Z833 Family history of diabetes mellitus: Secondary | ICD-10-CM | POA: Diagnosis not present

## 2018-05-25 DIAGNOSIS — Z7902 Long term (current) use of antithrombotics/antiplatelets: Secondary | ICD-10-CM

## 2018-05-25 DIAGNOSIS — Z8249 Family history of ischemic heart disease and other diseases of the circulatory system: Secondary | ICD-10-CM | POA: Diagnosis not present

## 2018-05-25 DIAGNOSIS — I110 Hypertensive heart disease with heart failure: Secondary | ICD-10-CM | POA: Diagnosis present

## 2018-05-25 DIAGNOSIS — G4733 Obstructive sleep apnea (adult) (pediatric): Secondary | ICD-10-CM | POA: Diagnosis present

## 2018-05-25 DIAGNOSIS — Z7984 Long term (current) use of oral hypoglycemic drugs: Secondary | ICD-10-CM

## 2018-05-25 DIAGNOSIS — I252 Old myocardial infarction: Secondary | ICD-10-CM | POA: Diagnosis not present

## 2018-05-25 DIAGNOSIS — Z955 Presence of coronary angioplasty implant and graft: Secondary | ICD-10-CM

## 2018-05-25 DIAGNOSIS — J441 Chronic obstructive pulmonary disease with (acute) exacerbation: Secondary | ICD-10-CM | POA: Diagnosis present

## 2018-05-25 DIAGNOSIS — I2510 Atherosclerotic heart disease of native coronary artery without angina pectoris: Secondary | ICD-10-CM | POA: Diagnosis present

## 2018-05-25 DIAGNOSIS — K219 Gastro-esophageal reflux disease without esophagitis: Secondary | ICD-10-CM | POA: Diagnosis present

## 2018-05-25 DIAGNOSIS — I255 Ischemic cardiomyopathy: Secondary | ICD-10-CM | POA: Diagnosis present

## 2018-05-25 DIAGNOSIS — I5022 Chronic systolic (congestive) heart failure: Secondary | ICD-10-CM | POA: Diagnosis present

## 2018-05-25 DIAGNOSIS — Z7982 Long term (current) use of aspirin: Secondary | ICD-10-CM | POA: Diagnosis not present

## 2018-05-25 DIAGNOSIS — E785 Hyperlipidemia, unspecified: Secondary | ICD-10-CM | POA: Diagnosis present

## 2018-05-25 DIAGNOSIS — R0600 Dyspnea, unspecified: Secondary | ICD-10-CM

## 2018-05-25 DIAGNOSIS — J9601 Acute respiratory failure with hypoxia: Secondary | ICD-10-CM | POA: Diagnosis present

## 2018-05-25 DIAGNOSIS — J101 Influenza due to other identified influenza virus with other respiratory manifestations: Principal | ICD-10-CM | POA: Diagnosis present

## 2018-05-25 DIAGNOSIS — Z95810 Presence of automatic (implantable) cardiac defibrillator: Secondary | ICD-10-CM

## 2018-05-25 DIAGNOSIS — Z79899 Other long term (current) drug therapy: Secondary | ICD-10-CM | POA: Diagnosis not present

## 2018-05-25 DIAGNOSIS — F17210 Nicotine dependence, cigarettes, uncomplicated: Secondary | ICD-10-CM | POA: Diagnosis present

## 2018-05-25 DIAGNOSIS — K7460 Unspecified cirrhosis of liver: Secondary | ICD-10-CM | POA: Diagnosis present

## 2018-05-25 DIAGNOSIS — E119 Type 2 diabetes mellitus without complications: Secondary | ICD-10-CM | POA: Diagnosis present

## 2018-05-25 LAB — URINALYSIS, COMPLETE (UACMP) WITH MICROSCOPIC
Bilirubin Urine: NEGATIVE
Glucose, UA: NEGATIVE mg/dL
Hgb urine dipstick: NEGATIVE
Ketones, ur: 20 mg/dL — AB
Leukocytes, UA: NEGATIVE
Nitrite: NEGATIVE
Protein, ur: NEGATIVE mg/dL
Specific Gravity, Urine: 1.021 (ref 1.005–1.030)
Squamous Epithelial / HPF: NONE SEEN (ref 0–5)
pH: 5 (ref 5.0–8.0)

## 2018-05-25 LAB — LACTIC ACID, PLASMA: Lactic Acid, Venous: 1.6 mmol/L (ref 0.5–1.9)

## 2018-05-25 LAB — INFLUENZA PANEL BY PCR (TYPE A & B)
Influenza A By PCR: NEGATIVE
Influenza B By PCR: POSITIVE — AB

## 2018-05-25 LAB — GLUCOSE, CAPILLARY: Glucose-Capillary: 221 mg/dL — ABNORMAL HIGH (ref 70–99)

## 2018-05-25 LAB — COMPREHENSIVE METABOLIC PANEL
ALT: 18 U/L (ref 0–44)
AST: 35 U/L (ref 15–41)
Albumin: 4.4 g/dL (ref 3.5–5.0)
Alkaline Phosphatase: 96 U/L (ref 38–126)
Anion gap: 11 (ref 5–15)
BUN: 26 mg/dL — AB (ref 8–23)
CO2: 22 mmol/L (ref 22–32)
Calcium: 9 mg/dL (ref 8.9–10.3)
Chloride: 100 mmol/L (ref 98–111)
Creatinine, Ser: 1.15 mg/dL (ref 0.61–1.24)
GFR calc Af Amer: >60 mL/min (ref >60)
GFR calc non Af Amer: >60 mL/min (ref >60)
Glucose, Bld: 153 mg/dL — ABNORMAL HIGH (ref 70–99)
POTASSIUM: 4.7 mmol/L (ref 3.5–5.1)
Sodium: 133 mmol/L — ABNORMAL LOW (ref 135–145)
Total Bilirubin: 1 mg/dL (ref 0.3–1.2)
Total Protein: 7.8 g/dL (ref 6.5–8.1)

## 2018-05-25 LAB — PROCALCITONIN: Procalcitonin: <0.1 ng/mL

## 2018-05-25 LAB — HEMOGLOBIN A1C
Hgb A1c MFr Bld: 7.2 % — ABNORMAL HIGH (ref 4.8–5.6)
Mean Plasma Glucose: 159.94 mg/dL

## 2018-05-25 LAB — CBC WITH DIFFERENTIAL/PLATELET
Abs Immature Granulocytes: 0.01 10*3/uL (ref 0.00–0.07)
Basophils Absolute: 0 10*3/uL (ref 0.0–0.1)
Basophils Relative: 0 %
Eosinophils Absolute: 0 10*3/uL (ref 0.0–0.5)
Eosinophils Relative: 0 %
HCT: 39.7 % (ref 39.0–52.0)
Hemoglobin: 13.2 g/dL (ref 13.0–17.0)
IMMATURE GRANULOCYTES: 0 %
Lymphocytes Relative: 7 %
Lymphs Abs: 0.5 10*3/uL — ABNORMAL LOW (ref 0.7–4.0)
MCH: 31.7 pg (ref 26.0–34.0)
MCHC: 33.2 g/dL (ref 30.0–36.0)
MCV: 95.2 fL (ref 80.0–100.0)
Monocytes Absolute: 0.7 10*3/uL (ref 0.1–1.0)
Monocytes Relative: 9 %
Neutro Abs: 6 10*3/uL (ref 1.7–7.7)
Neutrophils Relative %: 84 %
PLATELETS: 159 10*3/uL (ref 150–400)
RBC: 4.17 MIL/uL — ABNORMAL LOW (ref 4.22–5.81)
RDW: 14.8 % (ref 11.5–15.5)
WBC: 7.2 10*3/uL (ref 4.0–10.5)
nRBC: 0 % (ref 0.0–0.2)

## 2018-05-25 LAB — TROPONIN I: Troponin I: <0.03 ng/mL (ref <0.03)

## 2018-05-25 MED ORDER — PANTOPRAZOLE SODIUM 40 MG PO TBEC
40.0000 mg | DELAYED_RELEASE_TABLET | ORAL | Status: DC
  Administered 2018-05-25 – 2018-05-28 (×3): 40 mg via ORAL
  Filled 2018-05-25 (×4): qty 1

## 2018-05-25 MED ORDER — ATORVASTATIN CALCIUM 20 MG PO TABS
40.0000 mg | ORAL_TABLET | Freq: Every day | ORAL | Status: DC
  Administered 2018-05-25 – 2018-05-27 (×3): 40 mg via ORAL
  Filled 2018-05-25 (×3): qty 2

## 2018-05-25 MED ORDER — ONDANSETRON HCL 4 MG/2ML IJ SOLN: 4.0000 mg | Freq: Four times a day (QID) | INTRAMUSCULAR | Status: DC | PRN

## 2018-05-25 MED ORDER — METOPROLOL SUCCINATE ER 100 MG PO TB24
100.0000 mg | ORAL_TABLET | Freq: Every day | ORAL | Status: DC
  Administered 2018-05-25 – 2018-05-28 (×4): 100 mg via ORAL
  Filled 2018-05-25 (×4): qty 1

## 2018-05-25 MED ORDER — SODIUM CHLORIDE 0.9 % IV BOLUS
250.0000 mL | Freq: Once | INTRAVENOUS | Status: AC
  Administered 2018-05-25: 250 mL via INTRAVENOUS

## 2018-05-25 MED ORDER — ACETAMINOPHEN 325 MG PO TABS
650.0000 mg | ORAL_TABLET | Freq: Four times a day (QID) | ORAL | Status: DC | PRN
  Administered 2018-05-26: 650 mg via ORAL
  Filled 2018-05-25: qty 2

## 2018-05-25 MED ORDER — INSULIN ASPART 100 UNIT/ML ~~LOC~~ SOLN
0.0000 [IU] | Freq: Every day | SUBCUTANEOUS | Status: DC
  Administered 2018-05-25: 2 [IU] via SUBCUTANEOUS
  Filled 2018-05-25: qty 1

## 2018-05-25 MED ORDER — METHYLPREDNISOLONE SODIUM SUCC 125 MG IJ SOLR
125.0000 mg | Freq: Once | INTRAMUSCULAR | Status: AC
  Administered 2018-05-25: 125 mg via INTRAVENOUS
  Filled 2018-05-25: qty 2

## 2018-05-25 MED ORDER — METFORMIN HCL 500 MG PO TABS
1000.0000 mg | ORAL_TABLET | Freq: Two times a day (BID) | ORAL | Status: DC
  Administered 2018-05-26 – 2018-05-28 (×5): 1000 mg via ORAL
  Filled 2018-05-25 (×5): qty 2

## 2018-05-25 MED ORDER — BUPROPION HCL 75 MG PO TABS
75.0000 mg | ORAL_TABLET | Freq: Every day | ORAL | Status: DC
  Administered 2018-05-25 – 2018-05-28 (×4): 75 mg via ORAL
  Filled 2018-05-25 (×4): qty 1

## 2018-05-25 MED ORDER — OXYCODONE HCL 5 MG PO TABS: 5.0000 mg | ORAL_TABLET | ORAL | Status: DC | PRN

## 2018-05-25 MED ORDER — IPRATROPIUM-ALBUTEROL 0.5-2.5 (3) MG/3ML IN SOLN
3.0000 mL | Freq: Once | RESPIRATORY_TRACT | Status: AC
  Administered 2018-05-25: 3 mL via RESPIRATORY_TRACT

## 2018-05-25 MED ORDER — MOMETASONE FURO-FORMOTEROL FUM 200-5 MCG/ACT IN AERO
2.0000 | INHALATION_SPRAY | Freq: Two times a day (BID) | RESPIRATORY_TRACT | Status: DC
  Administered 2018-05-25 – 2018-05-28 (×6): 2 via RESPIRATORY_TRACT
  Filled 2018-05-25: qty 8.8

## 2018-05-25 MED ORDER — ASPIRIN EC 81 MG PO TBEC
81.0000 mg | DELAYED_RELEASE_TABLET | Freq: Every day | ORAL | Status: DC
  Administered 2018-05-25 – 2018-05-28 (×4): 81 mg via ORAL
  Filled 2018-05-25 (×4): qty 1

## 2018-05-25 MED ORDER — CLOPIDOGREL BISULFATE 75 MG PO TABS
75.0000 mg | ORAL_TABLET | Freq: Every day | ORAL | Status: DC
  Administered 2018-05-25 – 2018-05-28 (×4): 75 mg via ORAL
  Filled 2018-05-25 (×4): qty 1

## 2018-05-25 MED ORDER — IPRATROPIUM-ALBUTEROL 0.5-2.5 (3) MG/3ML IN SOLN
3.0000 mL | RESPIRATORY_TRACT | Status: DC
  Administered 2018-05-25 – 2018-05-26 (×5): 3 mL via RESPIRATORY_TRACT
  Filled 2018-05-25 (×4): qty 3

## 2018-05-25 MED ORDER — SUCRALFATE 1 G PO TABS
1.0000 g | ORAL_TABLET | Freq: Three times a day (TID) | ORAL | Status: DC
  Administered 2018-05-25 – 2018-05-28 (×10): 1 g via ORAL
  Filled 2018-05-25 (×10): qty 1

## 2018-05-25 MED ORDER — LINAGLIPTIN 5 MG PO TABS
5.0000 mg | ORAL_TABLET | Freq: Every day | ORAL | Status: DC
  Administered 2018-05-25 – 2018-05-28 (×4): 5 mg via ORAL
  Filled 2018-05-25 (×4): qty 1

## 2018-05-25 MED ORDER — ACETAMINOPHEN 650 MG RE SUPP: 650.0000 mg | Freq: Four times a day (QID) | RECTAL | Status: DC | PRN

## 2018-05-25 MED ORDER — ENOXAPARIN SODIUM 40 MG/0.4ML ~~LOC~~ SOLN
40.0000 mg | SUBCUTANEOUS | Status: DC
  Administered 2018-05-25 – 2018-05-27 (×3): 40 mg via SUBCUTANEOUS
  Filled 2018-05-25 (×3): qty 0.4

## 2018-05-25 MED ORDER — IPRATROPIUM-ALBUTEROL 0.5-2.5 (3) MG/3ML IN SOLN: 3.0000 mL | Freq: Four times a day (QID) | RESPIRATORY_TRACT | Status: DC | PRN

## 2018-05-25 MED ORDER — IPRATROPIUM-ALBUTEROL 0.5-2.5 (3) MG/3ML IN SOLN
3.0000 mL | Freq: Once | RESPIRATORY_TRACT | Status: AC
  Administered 2018-05-25: 3 mL via RESPIRATORY_TRACT
  Filled 2018-05-25: qty 6

## 2018-05-25 MED ORDER — SACUBITRIL-VALSARTAN 24-26 MG PO TABS
1.0000 | ORAL_TABLET | Freq: Two times a day (BID) | ORAL | Status: DC
  Administered 2018-05-26 – 2018-05-27 (×2): 1 via ORAL
  Filled 2018-05-25 (×4): qty 1

## 2018-05-25 MED ORDER — ALBUTEROL SULFATE (2.5 MG/3ML) 0.083% IN NEBU
2.5000 mg | INHALATION_SOLUTION | Freq: Once | RESPIRATORY_TRACT | Status: AC
  Administered 2018-05-25: 2.5 mg via RESPIRATORY_TRACT
  Filled 2018-05-25: qty 3

## 2018-05-25 MED ORDER — SPIRONOLACTONE 25 MG PO TABS
12.5000 mg | ORAL_TABLET | Freq: Every day | ORAL | Status: DC
  Administered 2018-05-25 – 2018-05-27 (×3): 12.5 mg via ORAL
  Filled 2018-05-25: qty 0.5
  Filled 2018-05-25 (×2): qty 1
  Filled 2018-05-25: qty 0.5
  Filled 2018-05-25: qty 1
  Filled 2018-05-25: qty 0.5

## 2018-05-25 MED ORDER — OSELTAMIVIR PHOSPHATE 75 MG PO CAPS: 75.0000 mg | ORAL_CAPSULE | Freq: Two times a day (BID) | ORAL | Status: DC

## 2018-05-25 MED ORDER — METHYLPREDNISOLONE SODIUM SUCC 125 MG IJ SOLR
60.0000 mg | INTRAMUSCULAR | Status: DC
  Administered 2018-05-25 – 2018-05-27 (×3): 60 mg via INTRAVENOUS
  Filled 2018-05-25 (×3): qty 2

## 2018-05-25 MED ORDER — INSULIN ASPART 100 UNIT/ML ~~LOC~~ SOLN
0.0000 [IU] | Freq: Three times a day (TID) | SUBCUTANEOUS | Status: DC
  Administered 2018-05-26: 2 [IU] via SUBCUTANEOUS
  Administered 2018-05-26: 1 [IU] via SUBCUTANEOUS
  Administered 2018-05-26: 5 [IU] via SUBCUTANEOUS
  Administered 2018-05-27: 3 [IU] via SUBCUTANEOUS
  Administered 2018-05-27 (×2): 2 [IU] via SUBCUTANEOUS
  Administered 2018-05-28: 3 [IU] via SUBCUTANEOUS
  Filled 2018-05-25 (×6): qty 1

## 2018-05-25 MED ORDER — ONDANSETRON HCL 4 MG/2ML IJ SOLN
4.0000 mg | Freq: Once | INTRAMUSCULAR | Status: AC
  Administered 2018-05-25: 4 mg via INTRAVENOUS
  Filled 2018-05-25: qty 2

## 2018-05-25 MED ORDER — ORAL CARE MOUTH RINSE
15.0000 mL | Freq: Two times a day (BID) | OROMUCOSAL | Status: DC
  Administered 2018-05-26 – 2018-05-27 (×3): 15 mL via OROMUCOSAL

## 2018-05-25 MED ORDER — ONDANSETRON HCL 4 MG PO TABS: 4.0000 mg | ORAL_TABLET | Freq: Four times a day (QID) | ORAL | Status: DC | PRN

## 2018-05-25 MED ORDER — OSELTAMIVIR PHOSPHATE 75 MG PO CAPS
75.0000 mg | ORAL_CAPSULE | Freq: Once | ORAL | Status: AC
  Administered 2018-05-25: 75 mg via ORAL
  Filled 2018-05-25: qty 1

## 2018-05-25 MED ORDER — OSELTAMIVIR PHOSPHATE 30 MG PO CAPS
30.0000 mg | ORAL_CAPSULE | Freq: Two times a day (BID) | ORAL | Status: DC
  Administered 2018-05-25 – 2018-05-26 (×2): 30 mg via ORAL
  Filled 2018-05-25 (×3): qty 1

## 2018-05-25 NOTE — ED Provider Notes (Signed)
[facility] Emergency Department Provider Note    First MD Initiated Contact with Patient 05/25/18 1422     (approximate)  I have reviewed the triage vital signs and the nursing notes.   HISTORY  Chief Complaint Nausea    HPI John Frank is a 63 y.o. male below listed past medical history presents the ER with several days of worsening productive cough nausea vomiting epigastric discomfort fevers and chills.  Patient with a history of COPD does not wear home oxygen but found to be hypoxic on room air.  Not been on any recent antibiotics.  States has not been able to keep any food down since Sunday.   Also has history of congestive heart failure with recent admission to the hospital for gastritis and EGD.   Past Medical History:  Diagnosis Date  . CAD (coronary artery disease)    s/p stents in 2017  . CHF (congestive heart failure) ([place])   . COPD (chronic obstructive pulmonary disease) ([place])   . Diabetes ([place])   . GERD (gastroesophageal reflux disease)   . Heart attack ([place])   . Hyperlipidemia   . Hypertension   . Presence of permanent cardiac pacemaker    and defibrillator  . Shortness of breath dyspnea   . Sleep apnea    Family History  Problem Relation Age of Onset  . Diabetes Mother   . Heart attack Father 53   Past Surgical History:  Procedure Laterality Date  . cardiac stents    . CARDIAC SURGERY     defibrillator and pacemaker  . CORONARY ANGIOPLASTY    . ESOPHAGOGASTRODUODENOSCOPY N/A 09/19/2014   Procedure: ESOPHAGOGASTRODUODENOSCOPY (EGD);  Surgeon: Lucilla Lame, MD;  Location: [facility] ENDOSCOPY;  Service: Endoscopy;  Laterality: N/A;  . ESOPHAGOGASTRODUODENOSCOPY (EGD) WITH PROPOFOL N/A 09/28/2017   Procedure: ESOPHAGOGASTRODUODENOSCOPY (EGD) WITH PROPOFOL;  Surgeon: Virgel Manifold, MD;  Location: [facility] ENDOSCOPY;  Service: Endoscopy;  Laterality: N/A;  . ESOPHAGOGASTRODUODENOSCOPY (EGD) WITH PROPOFOL N/A 04/19/2018   Procedure:  ESOPHAGOGASTRODUODENOSCOPY (EGD) WITH PROPOFOL with Gastric Mapping;  Surgeon: [name], MD;  Location: [facility] ENDOSCOPY;  Service: Gastroenterology;  Laterality: N/A;   Patient Active Problem List   Diagnosis Date Noted  . COPD exacerbation ([place]) 05/25/2018  . Iron deficiency anemia   . Cirrhosis of liver with ascites ([place])   . Pain in both lower extremities 09/15/2017  . Chronic systolic heart failure ([place]) 08/31/2017  . CHF exacerbation ([place]) 08/09/2017  . Recurrent syncope 05/26/2017  . Tobacco abuse 04/06/2015  . HTN (hypertension) 11/03/2014  . Sleep apnea 10/26/2014  . CAD (coronary artery disease) 10/26/2014  . GERD (gastroesophageal reflux disease) 10/26/2014  . Polyp of colon 10/26/2014  . Diabetes mellitus without complication ([place]) 34/74/2595  . Hyperlipidemia 10/26/2014  . COPD, severe ([place]) 10/26/2014  . Status cardiac pacemaker 10/26/2014  . Lesion of nasal septum 10/26/2014      Prior to Admission medications   Medication Sig Start Date End Date Taking? Authorizing Provider  albuterol (PROVENTIL HFA;VENTOLIN HFA) 108 (90 Base) MCG/ACT inhaler Inhale 2 puffs into the lungs every 6 (six) hours as needed for wheezing or shortness of breath. 05/26/17  Yes Henreitta Leber, MD  aspirin EC 81 MG tablet Take 81 mg by mouth daily.   Yes [provider]  atorvastatin (LIPITOR) 40 MG tablet TAKE 1 TABLET (40 MG TOTAL) BY MOUTH DAILY. 03/08/18  Yes Cannady, Jolene T, NP  budesonide-formoterol (SYMBICORT) 160-4.5 MCG/ACT inhaler Inhale 2 puffs into the lungs  2 (two) times daily. 06/15/15  Yes Kathrine Haddock, NP  buPROPion (WELLBUTRIN) 75 MG tablet Take 75 mg by mouth daily. 03/26/16 05/25/18 Yes [provider]  clopidogrel (PLAVIX) 75 MG tablet TAKE 1 TABLET (75 MG TOTAL) BY MOUTH DAILY. 12/08/16  Yes Kathrine Haddock, NP  famotidine (PEPCID) 40 MG tablet TAKE 1 TABLET BY MOUTH EVERY DAY 09/09/17  Yes Kathrine Haddock, NP  furosemide (LASIX) 40 MG tablet Take 1 tablet  (40 mg total) by mouth daily. 08/11/17  Yes Wieting, Richard, MD  ipratropium-albuterol (DUONEB) 0.5-2.5 (3) MG/3ML SOLN Take 3 mLs by nebulization every 6 (six) hours as needed (wheezing; shortness of breath).   Yes [provider]  JANUVIA 100 MG tablet TAKE 1 TABLET (100 MG TOTAL) BY MOUTH DAILY. 09/14/17  Yes Kathrine Haddock, NP  metFORMIN (GLUCOPHAGE) 1000 MG tablet TAKE 1 TABLET (1,000 MG TOTAL) BY MOUTH 2 (TWO) TIMES DAILY WITH A MEAL. 09/12/16  Yes Kathrine Haddock, NP  metoprolol succinate (TOPROL-XL) 100 MG 24 hr tablet Take 100 mg by mouth daily.  08/19/17  Yes [provider]  ONE TOUCH ULTRA TEST test strip USE TO TEST BLOOD SUGAR ONCE DAILY 10/07/16  Yes Johnson, Megan P, DO  pantoprazole (PROTONIX) 40 MG tablet Take 1 tablet (40 mg total) by mouth every morning. Patient taking differently: Take 40 mg by mouth daily before breakfast.  06/29/17  Yes [name], MD  sacubitril-valsartan (ENTRESTO) 24-26 MG Take 1 tablet by mouth 2 (two) times daily. 04/27/18  Yes Darylene Price A, FNP  spironolactone (ALDACTONE) 25 MG tablet Take 0.5 tablets (12.5 mg total) by mouth daily. 12/29/17  Yes Darylene Price A, FNP  sucralfate (CARAFATE) 1 g tablet Take 1 g by mouth 4 (four) times daily -  with meals and at bedtime.  05/15/17 05/25/18 Yes [provider]  albuterol (PROVENTIL) (2.5 MG/3ML) 0.083% nebulizer solution Take 2.5 mg by nebulization every 6 (six) hours as needed for wheezing or shortness of breath.    [provider]    Allergies Patient has no known allergies.    Social History Social History   Tobacco Use  . Smoking status: Current Every Day Smoker    Packs/day: 1.00    Years: 40.00    Pack years: 40.00    Types: Cigarettes  . Smokeless tobacco: Former Network engineer Use Topics  . Alcohol use: No  . Drug use: Never    Review of Systems Patient denies headaches, rhinorrhea, blurry vision, numbness, shortness of breath, chest pain, edema, cough,  abdominal pain, nausea, vomiting, diarrhea, dysuria, fevers, rashes or hallucinations unless otherwise stated above in HPI. ____________________________________________   PHYSICAL EXAM:  VITAL SIGNS: Vitals:   05/25/18 2023 05/25/18 2024  BP: 117/63   Pulse: 85   Resp: 18   Temp: 98.1 F (36.7 C)   SpO2: 96% 96%    Constitutional: Alert and oriented.  Eyes: Conjunctivae are normal.  Head: Atraumatic. Nose: No congestion/rhinnorhea. Mouth/Throat: Mucous membranes are moist.   Neck: No stridor. Painless ROM.  Cardiovascular:mildly tachycardic regular rhythm. Grossly normal heart sounds.  Good peripheral circulation. Respiratory:   No retractions. With coarse breath sounds, expiratory wheeze and tachypnea Gastrointestinal: Soft and nontender. No distention. No abdominal bruits. No CVA tenderness. Genitourinary:  Musculoskeletal: No lower extremity tenderness nor edema.  No joint effusions. Neurologic:  Normal speech and language. No gross focal neurologic deficits are appreciated. No facial droop Skin:  Skin is warm, dry and intact. No rash noted. Psychiatric: Mood and  affect are normal. Speech and behavior are normal.  ____________________________________________   LABS (all labs ordered are listed, but only abnormal results are displayed)  Results for orders placed or performed during the hospital encounter of 05/25/18 (from the past 24 hour(s))  Comprehensive metabolic panel     Status: Abnormal   Collection Time: 05/25/18  2:15 PM  Result Value Ref Range   Sodium 133 (L) 135 - 145 mmol/L   Potassium 4.7 3.5 - 5.1 mmol/L   Chloride 100 98 - 111 mmol/L   CO2 22 22 - 32 mmol/L   Glucose, Bld 153 (H) 70 - 99 mg/dL   BUN 26 (H) 8 - 23 mg/dL   Creatinine, Ser 1.15 0.61 - 1.24 mg/dL   Calcium 9.0 8.9 - 10.3 mg/dL   Total Protein 7.8 6.5 - 8.1 g/dL   Albumin 4.4 3.5 - 5.0 g/dL   AST 35 15 - 41 U/L   ALT 18 0 - 44 U/L   Alkaline Phosphatase 96 38 - 126 U/L   Total  Bilirubin 1.0 0.3 - 1.2 mg/dL   GFR calc non Af Amer >60 >60 mL/min   GFR calc Af Amer >60 >60 mL/min   Anion gap 11 5 - 15  Troponin I - ONCE - STAT     Status: None   Collection Time: 05/25/18  2:15 PM  Result Value Ref Range   Troponin I <0.03 <0.03 ng/mL  CBC WITH DIFFERENTIAL     Status: Abnormal   Collection Time: 05/25/18  2:15 PM  Result Value Ref Range   WBC 7.2 4.0 - 10.5 K/uL   RBC 4.17 (L) 4.22 - 5.81 MIL/uL   Hemoglobin 13.2 13.0 - 17.0 g/dL   HCT 39.7 39.0 - 52.0 %   MCV 95.2 80.0 - 100.0 fL   MCH 31.7 26.0 - 34.0 pg   MCHC 33.2 30.0 - 36.0 g/dL   RDW 14.8 11.5 - 15.5 %   Platelets 159 150 - 400 K/uL   nRBC 0.0 0.0 - 0.2 %   Neutrophils Relative % 84 %   Neutro Abs 6.0 1.7 - 7.7 K/uL   Lymphocytes Relative 7 %   Lymphs Abs 0.5 (L) 0.7 - 4.0 K/uL   Monocytes Relative 9 %   Monocytes Absolute 0.7 0.1 - 1.0 K/uL   Eosinophils Relative 0 %   Eosinophils Absolute 0.0 0.0 - 0.5 K/uL   Basophils Relative 0 %   Basophils Absolute 0.0 0.0 - 0.1 K/uL   Immature Granulocytes 0 %   Abs Immature Granulocytes 0.01 0.00 - 0.07 K/uL  Procalcitonin     Status: None   Collection Time: 05/25/18  2:15 PM  Result Value Ref Range   Procalcitonin <0.10 ng/mL  Lactic acid, plasma     Status: None   Collection Time: 05/25/18  3:07 PM  Result Value Ref Range   Lactic Acid, Venous 1.6 0.5 - 1.9 mmol/L  Urinalysis, Complete w Microscopic     Status: Abnormal   Collection Time: 05/25/18  3:07 PM  Result Value Ref Range   Color, Urine YELLOW (A) YELLOW   APPearance CLEAR (A) CLEAR   Specific Gravity, Urine 1.021 1.005 - 1.030   pH 5.0 5.0 - 8.0   Glucose, UA NEGATIVE NEGATIVE mg/dL   Hgb urine dipstick NEGATIVE NEGATIVE   Bilirubin Urine NEGATIVE NEGATIVE   Ketones, ur 20 (A) NEGATIVE mg/dL   Protein, ur NEGATIVE NEGATIVE mg/dL   Nitrite NEGATIVE NEGATIVE   Leukocytes, UA NEGATIVE  NEGATIVE   RBC / HPF 0-5 0 - 5 RBC/hpf   WBC, UA 0-5 0 - 5 WBC/hpf   Bacteria, UA RARE (A) NONE  SEEN   Squamous Epithelial / LPF NONE SEEN 0 - 5   Mucus PRESENT    Sperm, UA PRESENT   Influenza panel by PCR (type A & B)     Status: Abnormal   Collection Time: 05/25/18  3:07 PM  Result Value Ref Range   Influenza A By PCR NEGATIVE NEGATIVE   Influenza B By PCR POSITIVE (A) NEGATIVE  Glucose, capillary     Status: Abnormal   Collection Time: 05/25/18  8:21 PM  Result Value Ref Range   Glucose-Capillary 221 (H) 70 - 99 mg/dL   Comment 1 Notify RN    ____________________________________________  EKG My review and personal interpretation at Time: 15:35   Indication: sob  Rate: 105  Rhythm: sinus Axis: normal Other: poor r wave progression, nonspecific t wave abn ____________________________________________  RADIOLOGY  I personally reviewed all radiographic images ordered to evaluate for the above acute complaints and reviewed radiology reports and findings.  These findings were personally discussed with the patient.  Please see medical record for radiology report.  ____________________________________________   PROCEDURES  Procedure(s) performed:  .Critical Care Performed by: Merlyn Lot, MD Authorized by: Merlyn Lot, MD   Critical care provider statement:    Critical care time (minutes):  30   Critical care time was exclusive of:  Separately billable procedures and treating other patients   Critical care was necessary to treat or prevent imminent or life-threatening deterioration of the following conditions:  Respiratory failure   Critical care was time spent personally by me on the following activities:  Development of treatment plan with patient or surrogate, discussions with consultants, evaluation of patient's response to treatment, examination of patient, obtaining history from patient or surrogate, ordering and performing treatments and interventions, ordering and review of laboratory studies, ordering and review of radiographic studies, pulse oximetry,  re-evaluation of patient's condition and review of old charts      Critical Care performed: yes ____________________________________________   INITIAL IMPRESSION / [place] / ED COURSE  Pertinent labs & imaging results that were available during my care of the patient were reviewed by me and considered in my medical decision making (see chart for details).   DDX: Asthma, copd, CHF, pna, ptx, malignancy, Pe, anemia   John Frank is a 63 y.o. who presents to the ED with symptoms as described above.  Patient arrives hypoxic with productive cough.  Patient with acute respiratory failure with hypoxia requiring supplemental oxygen.  Given his history letter except for the above differential.  Chest x-ray shows no evidence of congestive heart failure.  Nebulizers were given for bronchodilation.  Also given IV steroids.  Patient did test positive for flu B.  Given the acuity of his presentation he will require hospitalization for further medical management.      As part of my medical decision making, I reviewed the following data within the [place] notes reviewed and incorporated, Labs reviewed, notes from prior ED visits and [place] Controlled Substance Database   ____________________________________________   FINAL CLINICAL IMPRESSION(S) / ED DIAGNOSES  Final diagnoses:  Chronic obstructive pulmonary disease with acute exacerbation ([place])  Acute respiratory failure with hypoxia ([place])  Influenza B      NEW MEDICATIONS STARTED DURING THIS VISIT:  Current Discharge Medication List  Note:  This document was prepared using Dragon voice recognition software and may include unintentional dictation errors.    Merlyn Lot, MD 05/25/18 2039

## 2018-05-25 NOTE — ED Triage Notes (Signed)
Pt to ED via EMS from home. Pt called ems for nausea. PT has been throwing up since Sunday, no vomiting today. PT also had a fall on Monday, states he is a little sore from that. NAD at this time.

## 2018-05-25 NOTE — ED Notes (Signed)
Attempted to call report - RN on hold for 5 minutes with no answer

## 2018-05-25 NOTE — Progress Notes (Signed)
PHARMACY NOTE:  ANTIMICROBIAL RENAL DOSAGE ADJUSTMENT  Current antimicrobial regimen includes a mismatch between antimicrobial dosage and estimated renal function.  As per policy approved by the Pharmacy & Therapeutics and Medical Executive Committees, the antimicrobial dosage will be adjusted accordingly.  Current antimicrobial dosage:  Tamiful 75 mg PO BID   Indication:  Influenza   Renal Function:  Estimated Creatinine Clearance: 55.1 mL/min (by C-G formula based on SCr of 1.15 mg/dL). []      On intermittent HD, scheduled: []      On CRRT    Antimicrobial dosage has been changed to:  Tamiflu 30 mg PO BID to start 1/14 @ 2300.   Additional comments:   Thank you for allowing pharmacy to be a part of this patient's care.  Anjelica Gorniak D, [facility] 05/25/2018 7:40 PM

## 2018-05-25 NOTE — ED Notes (Signed)
Pt provided pillow, drink, and repositioned in bed

## 2018-05-25 NOTE — ED Notes (Signed)
Pt provided water.  

## 2018-05-25 NOTE — H&P (Signed)
John Frank NAME: John Frank    MR#:  [id number]  DATE OF BIRTH:  Jul 09, 1955  DATE OF ADMISSION:  05/25/2018  PRIMARY CARE PHYSICIAN: Idelle Crouch, MD   REQUESTING/REFERRING PHYSICIAN: Dr. Merlyn Lot  CHIEF COMPLAINT:   Chief Complaint  Patient presents with  . Nausea    HISTORY OF PRESENT ILLNESS:  John Frank  is a 63 y.o. male with a known history of COPD not on home oxygen, ischemic cardiomyopathy with EF of 25%, diabetes, CAD, hypertension presents to hospital secondary to worsening cough, shortness of breath and nausea for 3 days. Patient mentions that he has been feeling sick for almost 3 weeks now.  But for the last 3 to 4 days he has been constantly nauseous, has a dry cough with worsening shortness of breath and body aches.  Denies any fevers but had some chills.  Continues to smoke.  Presents to the emergency room today.  Flu test is positive and also has scant breath sounds consistent with COPD exacerbation.  Does not appear fluid overloaded.  Chest x-ray is otherwise clear.  He is being admitted for COPD exacerbation and influenza.  PAST MEDICAL HISTORY:   Past Medical History:  Diagnosis Date  . CAD (coronary artery disease)    s/p stents in 2017  . CHF (congestive heart failure) ([place])   . COPD (chronic obstructive pulmonary disease) ([place])   . Diabetes ([place])   . GERD (gastroesophageal reflux disease)   . Heart attack ([place])   . Hyperlipidemia   . Hypertension   . Presence of permanent cardiac pacemaker    and defibrillator  . Shortness of breath dyspnea   . Sleep apnea     PAST SURGICAL HISTORY:   Past Surgical History:  Procedure Laterality Date  . cardiac stents    . CARDIAC SURGERY     defibrillator and pacemaker  . CORONARY ANGIOPLASTY    . ESOPHAGOGASTRODUODENOSCOPY N/A 09/19/2014   Procedure: ESOPHAGOGASTRODUODENOSCOPY (EGD);  Surgeon: Lucilla Lame, MD;  Location: [facility] ENDOSCOPY;   Service: Endoscopy;  Laterality: N/A;  . ESOPHAGOGASTRODUODENOSCOPY (EGD) WITH PROPOFOL N/A 09/28/2017   Procedure: ESOPHAGOGASTRODUODENOSCOPY (EGD) WITH PROPOFOL;  Surgeon: Virgel Manifold, MD;  Location: [facility] ENDOSCOPY;  Service: Endoscopy;  Laterality: N/A;  . ESOPHAGOGASTRODUODENOSCOPY (EGD) WITH PROPOFOL N/A 04/19/2018   Procedure: ESOPHAGOGASTRODUODENOSCOPY (EGD) WITH PROPOFOL with Gastric Mapping;  Surgeon: [name], MD;  Location: [facility] ENDOSCOPY;  Service: Gastroenterology;  Laterality: N/A;    SOCIAL HISTORY:   Social History   Tobacco Use  . Smoking status: Current Every Day Smoker    Packs/day: 1.00    Years: 40.00    Pack years: 40.00    Types: Cigarettes  . Smokeless tobacco: Former Network engineer Use Topics  . Alcohol use: No    FAMILY HISTORY:   Family History  Problem Relation Age of Onset  . Diabetes Mother   . Heart attack Father 74    DRUG ALLERGIES:  No Known Allergies  REVIEW OF SYSTEMS:   Review of Systems  Constitutional: Positive for chills and malaise/fatigue. Negative for fever and weight loss.  HENT: Negative for ear discharge, ear pain, hearing loss and nosebleeds.   Eyes: Negative for blurred vision, double vision and photophobia.  Respiratory: Positive for cough, shortness of breath and wheezing. Negative for hemoptysis.   Cardiovascular: Positive for orthopnea. Negative for chest pain, palpitations and leg swelling.  Gastrointestinal: Positive for nausea. Negative for abdominal pain,  constipation, diarrhea, heartburn, melena and vomiting.  Genitourinary: Negative for dysuria, frequency and urgency.  Musculoskeletal: Positive for myalgias. Negative for back pain and neck pain.  Skin: Negative for rash.  Neurological: Negative for dizziness, tingling, sensory change, speech change, focal weakness and headaches.  Endo/Heme/Allergies: Does not bruise/bleed easily.  Psychiatric/Behavioral: Negative for depression.    MEDICATIONS AT  HOME:   Prior to Admission medications   Medication Sig Start Date End Date Taking? Authorizing Provider  albuterol (PROVENTIL HFA;VENTOLIN HFA) 108 (90 Base) MCG/ACT inhaler Inhale 2 puffs into the lungs every 6 (six) hours as needed for wheezing or shortness of breath. 05/26/17  Yes Henreitta Leber, MD  aspirin EC 81 MG tablet Take 81 mg by mouth daily.   Yes [provider]  atorvastatin (LIPITOR) 40 MG tablet TAKE 1 TABLET (40 MG TOTAL) BY MOUTH DAILY. 03/08/18  Yes Cannady, Jolene T, NP  budesonide-formoterol (SYMBICORT) 160-4.5 MCG/ACT inhaler Inhale 2 puffs into the lungs 2 (two) times daily. 06/15/15  Yes Kathrine Haddock, NP  buPROPion (WELLBUTRIN) 75 MG tablet Take 75 mg by mouth daily. 03/26/16 05/25/18 Yes [provider]  clopidogrel (PLAVIX) 75 MG tablet TAKE 1 TABLET (75 MG TOTAL) BY MOUTH DAILY. 12/08/16  Yes Kathrine Haddock, NP  famotidine (PEPCID) 40 MG tablet TAKE 1 TABLET BY MOUTH EVERY DAY 09/09/17  Yes Kathrine Haddock, NP  furosemide (LASIX) 40 MG tablet Take 1 tablet (40 mg total) by mouth daily. 08/11/17  Yes Wieting, Richard, MD  ipratropium-albuterol (DUONEB) 0.5-2.5 (3) MG/3ML SOLN Take 3 mLs by nebulization every 6 (six) hours as needed (wheezing; shortness of breath).   Yes [provider]  JANUVIA 100 MG tablet TAKE 1 TABLET (100 MG TOTAL) BY MOUTH DAILY. 09/14/17  Yes Kathrine Haddock, NP  metFORMIN (GLUCOPHAGE) 1000 MG tablet TAKE 1 TABLET (1,000 MG TOTAL) BY MOUTH 2 (TWO) TIMES DAILY WITH A MEAL. 09/12/16  Yes Kathrine Haddock, NP  metoprolol succinate (TOPROL-XL) 100 MG 24 hr tablet Take 100 mg by mouth daily.  08/19/17  Yes [provider]  ONE TOUCH ULTRA TEST test strip USE TO TEST BLOOD SUGAR ONCE DAILY 10/07/16  Yes Johnson, Megan P, DO  pantoprazole (PROTONIX) 40 MG tablet Take 1 tablet (40 mg total) by mouth every morning. Patient taking differently: Take 40 mg by mouth daily before breakfast.  06/29/17  Yes [name], MD    sacubitril-valsartan (ENTRESTO) 24-26 MG Take 1 tablet by mouth 2 (two) times daily. 04/27/18  Yes Darylene Price A, FNP  spironolactone (ALDACTONE) 25 MG tablet Take 0.5 tablets (12.5 mg total) by mouth daily. 12/29/17  Yes Darylene Price A, FNP  sucralfate (CARAFATE) 1 g tablet Take 1 g by mouth 4 (four) times daily -  with meals and at bedtime.  05/15/17 05/25/18 Yes [provider]  albuterol (PROVENTIL) (2.5 MG/3ML) 0.083% nebulizer solution Take 2.5 mg by nebulization every 6 (six) hours as needed for wheezing or shortness of breath.    [provider]      VITAL SIGNS:  Blood pressure 116/67, pulse 100, temperature 100.1 F (37.8 C), temperature source Oral, resp. rate (!) 29, SpO2 95 %.  PHYSICAL EXAMINATION:   Physical Exam  GENERAL:  63 y.o.-year-old patient lying in the bed with no acute distress.  EYES: Pupils equal, round, reactive to light and accommodation. No scleral icterus. Extraocular muscles intact.  HEENT: Head atraumatic, normocephalic. Oropharynx and nasopharynx clear.  NECK:  Supple, no jugular venous distention. No thyroid enlargement, no tenderness.  LUNGS: Tight breath sounds with scattered expiratory wheezing, no rales,rhonchi or crepitation. No use of accessory muscles of respiration.  CARDIOVASCULAR: S1, S2 normal. No  rubs, or gallops.  2/6 systolic murmur is present. Has a defibrillator and pacemaker in place ABDOMEN: Soft, nontender, nondistended. Bowel sounds present. No organomegaly or mass.  EXTREMITIES: No pedal edema, cyanosis, or clubbing.  NEUROLOGIC: Cranial nerves II through XII are intact. Muscle strength 5/5 in all extremities. Sensation intact. Gait not checked.  PSYCHIATRIC: The patient is alert and oriented x 3.  SKIN: No obvious rash, lesion, or ulcer.   LABORATORY PANEL:   CBC Recent Labs  Lab 05/25/18 1415  WBC 7.2  HGB 13.2  HCT 39.7  PLT 159    ------------------------------------------------------------------------------------------------------------------  Chemistries  Recent Labs  Lab 05/25/18 1415  NA 133*  K 4.7  CL 100  CO2 22  GLUCOSE 153*  BUN 26*  CREATININE 1.15  CALCIUM 9.0  AST 35  ALT 18  ALKPHOS 96  BILITOT 1.0   ------------------------------------------------------------------------------------------------------------------  Cardiac Enzymes Recent Labs  Lab 05/25/18 1415  TROPONINI <0.03   ------------------------------------------------------------------------------------------------------------------  RADIOLOGY:  Dg Chest 2 View  Result Date: 05/25/2018 CLINICAL DATA:  Productive cough. EXAM: CHEST - 2 VIEW COMPARISON:  Radiographs of January 14, 2018. FINDINGS: The heart size and mediastinal contours are within normal limits. Both lungs are clear. No pneumothorax or pleural effusion is noted. Left-sided pacemaker is unchanged in position. The visualized skeletal structures are unremarkable. IMPRESSION: No active cardiopulmonary disease. Electronically Signed   By: Marijo Conception, M.D.   On: 05/25/2018 14:52    EKG:   Orders placed or performed during the hospital encounter of 05/25/18  . ED EKG 12-Lead  . ED EKG 12-Lead  . EKG 12-Lead  . EKG 12-Lead    IMPRESSION AND PLAN:   Tyreke Kaeser  is a 63 y.o. male with a known history of COPD not on home oxygen, ischemic cardiomyopathy with EF of 25%, diabetes, CAD, hypertension presents to hospital secondary to worsening cough, shortness of breath and nausea for 3 days.  1.  Acute on chronic COPD exacerbation-not on home oxygen. -Increased respiratory rate and dry cough here. -Admit for IV steroids, nebs and inhalers -Chest x-ray is clear, procalcitonin is negative.  We will hold off on antibiotics -Also positive for influenza B-started on Tamiflu. -Symptomatic treatment for cough  2.  CAD-stable.  Continue aspirin, Plavix statin and  cardiac medications. -Denies any chest pain  3.  Ischemic cardiomyopathy, CHF EF of 25%-well compensated actually on the dry side due to poor oral intake for the last couple of days.  Hold Lasix.  Continue to monitor -Continue other cardiac medications including Entresto  4.  GERD-Protonix  5.  Diabetes mellitus-on metformin and Tradjenta, add sliding scale insulin.  6.  DVT prophylaxis-on Lovenox    All the records are reviewed and case discussed with ED provider. Management plans discussed with the patient, family and they are in agreement.  CODE STATUS: Full code  TOTAL TIME TAKING CARE OF THIS PATIENT: 52 minutes.    Gladstone Lighter M.D on 05/25/2018 at 5:46 PM  Between 7am to 6pm - Pager - [phone]  After 6pm go to [email or web address] - password [email or web address] [place]  Office  [phone]  CC: Primary care physician; Idelle Crouch, MD

## 2018-05-26 LAB — BASIC METABOLIC PANEL
Anion gap: 9 (ref 5–15)
BUN: 27 mg/dL — AB (ref 8–23)
CO2: 23 mmol/L (ref 22–32)
CREATININE: 0.98 mg/dL (ref 0.61–1.24)
Calcium: 8.6 mg/dL — ABNORMAL LOW (ref 8.9–10.3)
Chloride: 100 mmol/L (ref 98–111)
GFR calc Af Amer: >60 mL/min (ref >60)
GFR calc non Af Amer: >60 mL/min (ref >60)
Glucose, Bld: 319 mg/dL — ABNORMAL HIGH (ref 70–99)
Potassium: 4.9 mmol/L (ref 3.5–5.1)
Sodium: 132 mmol/L — ABNORMAL LOW (ref 135–145)

## 2018-05-26 LAB — GLUCOSE, CAPILLARY
GLUCOSE-CAPILLARY: 126 mg/dL — AB (ref 70–99)
Glucose-Capillary: 282 mg/dL — ABNORMAL HIGH (ref 70–99)
Glucose-Capillary: 165 mg/dL — ABNORMAL HIGH (ref 70–99)
Glucose-Capillary: 150 mg/dL — ABNORMAL HIGH (ref 70–99)

## 2018-05-26 LAB — URINE CULTURE: Culture: NO GROWTH

## 2018-05-26 LAB — CBC
HCT: 37.3 % — ABNORMAL LOW (ref 39.0–52.0)
Hemoglobin: 12.2 g/dL — ABNORMAL LOW (ref 13.0–17.0)
MCH: 31.1 pg (ref 26.0–34.0)
MCHC: 32.7 g/dL (ref 30.0–36.0)
MCV: 95.2 fL (ref 80.0–100.0)
Platelets: 138 10*3/uL — ABNORMAL LOW (ref 150–400)
RBC: 3.92 MIL/uL — ABNORMAL LOW (ref 4.22–5.81)
RDW: 14.6 % (ref 11.5–15.5)
WBC: 5.8 10*3/uL (ref 4.0–10.5)
nRBC: 0 % (ref 0.0–0.2)

## 2018-05-26 MED ORDER — GUAIFENESIN-DM 100-10 MG/5ML PO SYRP
5.0000 mL | ORAL_SOLUTION | ORAL | Status: DC | PRN
  Administered 2018-05-26: 5 mL via ORAL
  Filled 2018-05-26: qty 5

## 2018-05-26 MED ORDER — BISACODYL 5 MG PO TBEC: 5.0000 mg | DELAYED_RELEASE_TABLET | Freq: Every day | ORAL | Status: DC | PRN

## 2018-05-26 MED ORDER — BACITRACIN-NEOMYCIN-POLYMYXIN OINTMENT TUBE
TOPICAL_OINTMENT | CUTANEOUS | Status: DC | PRN
  Filled 2018-05-26: qty 14.17

## 2018-05-26 MED ORDER — SENNA 8.6 MG PO TABS: 1.0000 | ORAL_TABLET | Freq: Every day | ORAL | Status: DC | PRN

## 2018-05-26 MED ORDER — IPRATROPIUM-ALBUTEROL 0.5-2.5 (3) MG/3ML IN SOLN
3.0000 mL | Freq: Four times a day (QID) | RESPIRATORY_TRACT | Status: DC
  Administered 2018-05-26 – 2018-05-28 (×6): 3 mL via RESPIRATORY_TRACT
  Filled 2018-05-26 (×7): qty 3

## 2018-05-26 MED ORDER — OSELTAMIVIR PHOSPHATE 75 MG PO CAPS
75.0000 mg | ORAL_CAPSULE | Freq: Two times a day (BID) | ORAL | Status: DC
  Administered 2018-05-26 – 2018-05-28 (×4): 75 mg via ORAL
  Filled 2018-05-26 (×4): qty 1

## 2018-05-26 MED ORDER — SODIUM CHLORIDE 0.9% FLUSH
10.0000 mL | Freq: Two times a day (BID) | INTRAVENOUS | Status: DC
  Administered 2018-05-26 – 2018-05-28 (×5): 10 mL via INTRAVENOUS

## 2018-05-26 NOTE — Progress Notes (Signed)
Advanced Care Plan.  Purpose of Encounter: CODE STATUS. Parties in Attendance: The patient and me. Patient's Decisional Capacity: Yes. Medical Story: John Frank  is a 63 y.o. male with a known history of COPD not on home oxygen, ischemic cardiomyopathy with EF of 25%, s/p pacemaker placement, OSA, diabetes, CAD, hypertension.  The patient is admitted for acute respiratory failure with hypoxia due to COPD exacerbation and influenza B.  I discussed with the patient current condition, prognosis and CODE STATUS.  The patient wants to be resuscitated and intubated if he has cardiopulmonary arrest. Plan:  Code Status: Full code. Time spent discussing advance care planning: 17-18 minutes.

## 2018-05-26 NOTE — Progress Notes (Signed)
[place] [facility] [place] NAME: John Frank    MR#:  [id number]  DATE OF BIRTH:  Dec 02, 1955  SUBJECTIVE:  CHIEF COMPLAINT:   Chief Complaint  Patient presents with  . Nausea   Patient still has cough with a lot of sputum and shortness of breath, on oxygen by nasal cannula 2 L. REVIEW OF SYSTEMS:  Review of Systems  Constitutional: Positive for malaise/fatigue. Negative for chills and fever.  HENT: Negative for sore throat.   Eyes: Negative for blurred vision and double vision.  Respiratory: Positive for cough, sputum production and shortness of breath. Negative for hemoptysis, wheezing and stridor.   Cardiovascular: Negative for chest pain, palpitations, orthopnea and leg swelling.  Gastrointestinal: Negative for abdominal pain, blood in stool, diarrhea, melena, nausea and vomiting.  Genitourinary: Negative for dysuria, flank pain and hematuria.  Musculoskeletal: Negative for back pain and joint pain.  Skin: Negative for rash.  Neurological: Negative for dizziness, sensory change, focal weakness, seizures, loss of consciousness, weakness and headaches.  Endo/Heme/Allergies: Negative for polydipsia.  Psychiatric/Behavioral: Negative for depression. The patient is not nervous/anxious.     DRUG ALLERGIES:  No Known Allergies VITALS:  Blood pressure 119/78, pulse 79, temperature (!) 97.4 F (36.3 C), resp. rate 15, height 5\' 4"  (1.626 m), weight 66.7 kg, SpO2 95 %. PHYSICAL EXAMINATION:  Physical Exam Constitutional:      General: He is not in acute distress. HENT:     Head: Normocephalic.     Mouth/Throat:     Mouth: Mucous membranes are moist.  Eyes:     General: No scleral icterus.    Conjunctiva/sclera: Conjunctivae normal.     Pupils: Pupils are equal, round, and reactive to light.  Neck:     Musculoskeletal: Normal range of motion and neck supple.     Vascular: No JVD.     Trachea: No tracheal deviation.  Cardiovascular:       Rate and Rhythm: Normal rate and regular rhythm.     Heart sounds: Normal heart sounds. No murmur. No gallop.   Pulmonary:     Effort: Pulmonary effort is normal. No respiratory distress.     Breath sounds: No stridor. Wheezing present. No rhonchi or rales.  Abdominal:     General: Bowel sounds are normal. There is no distension.     Palpations: Abdomen is soft.     Tenderness: There is no abdominal tenderness. There is no rebound.  Musculoskeletal: Normal range of motion.        General: No tenderness.     Right lower leg: No edema.     Left lower leg: No edema.  Skin:    Findings: No erythema or rash.     Comments: Bruises on arms.  Skin tear on the right arm.  Neurological:     General: No focal deficit present.     Mental Status: He is alert and oriented to person, place, and time.     Cranial Nerves: No cranial nerve deficit.  Psychiatric:        Mood and Affect: Mood normal.    LABORATORY PANEL:  Male CBC Recent Labs  Lab 05/26/18 0541  WBC 5.8  HGB 12.2*  HCT 37.3*  PLT 138*   ------------------------------------------------------------------------------------------------------------------ Chemistries  Recent Labs  Lab 05/25/18 1415 05/26/18 0541  NA 133* 132*  K 4.7 4.9  CL 100 100  CO2 22 23  GLUCOSE 153* 319*  BUN 26* 27*  CREATININE 1.15  0.98  CALCIUM 9.0 8.6*  AST 35  --   ALT 18  --   ALKPHOS 96  --   BILITOT 1.0  --    RADIOLOGY:  No results found. ASSESSMENT AND PLAN:   John Frank  is a 63 y.o. male with a known history of COPD not on home oxygen, ischemic cardiomyopathy with EF of 25%, diabetes, CAD, hypertension presents to hospital secondary to worsening cough, shortness of breath and nausea for 3 days.  1.  Acute respiratory failure with hypoxia due to COPD exacerbation and influenza B. Continue oxygen by nasal cannula, continue IV steroids, nebs and inhalers. Continue Tamiflu, Robitussin as needed.  2.  CAD-stable.  Continue  aspirin, Plavix statin and cardiac medications.  3.  Ischemic cardiomyopathy, CHF EF of 25% Stable, continue cardiac medications including Entresto  4.  GERD-Protonix  5.  Diabetes mellitus-on metformin and Tradjenta at home,  Continue sliding scale insulin.  Tobacco abuse.  Smoking cessation was counseled for 3 to 4 minutes.  All the records are reviewed and case discussed with Care Management/Social Worker. Management plans discussed with the patient, family and they are in agreement.  CODE STATUS: Full Code  TOTAL TIME TAKING CARE OF THIS PATIENT: 32 minutes.   More than 50% of the time was spent in counseling/coordination of care: YES  POSSIBLE D/C IN 2 DAYS, DEPENDING ON CLINICAL CONDITION.   Demetrios Loll M.D on 05/26/2018 at 3:01 PM  Between 7am to 6pm - Pager - [phone]  After 6pm go to [email or web address] - [place] [facility]

## 2018-05-26 NOTE — Plan of Care (Signed)
  Problem: Clinical Measurements: Goal: Diagnostic test results will improve Outcome: Progressing   Problem: Clinical Measurements: Goal: Will remain free from infection Outcome: Not Progressing Note:  Pt positive for Flu B, remains afebrile, on Tamiflu

## 2018-05-26 NOTE — Progress Notes (Signed)
Talked to Dr. Anselm Jungling about patient's BP of 94/77, patient is to received  [facility], order to hold. RN will continue to monitor.

## 2018-05-26 NOTE — Plan of Care (Signed)
  Problem: Nutrition: Goal: Adequate nutrition will be maintained Outcome: Progressing   Problem: Pain Managment: Goal: General experience of comfort will improve Outcome: Progressing Note:  No complaints of pain this shift   Problem: Safety: Goal: Ability to remain free from injury will improve Outcome: Progressing   Problem: Skin Integrity: Goal: Risk for impaired skin integrity will decrease Outcome: Progressing   Problem: Education: Goal: Knowledge of General Education information will improve Description Including pain rating scale, medication(s)/side effects and non-pharmacologic comfort measures Outcome: Completed/Met

## 2018-05-26 NOTE — Evaluation (Signed)
Physical Therapy Evaluation Patient Details Name: John Frank MRN: [id number] DOB: 08/20/1955 Today's Date: 05/26/2018   History of Present Illness  Pt is a 63 y.o. male with a known history of COPD not on home oxygen with ischemic cardiomyopathy with EF of 25%, diabetes, CAD, hypertension presented to the hospital secondary to worsening cough, shortness of breath and nausea.  Patient mentioned that he has been feeling sick for almost 3 weeks now.  But for the last 3 to 4 days he has been constantly nauseous, has a dry cough with worsening shortness of breath and body aches.  Denies any fevers but had some chills.  Flu test was positive and also had scant breath sounds consistent with COPD exacerbation.  Did not appear fluid overloaded.  Chest x-ray was otherwise clear.  He was admitted for COPD exacerbation and influenza.    Clinical Impression  Pt presents with mild deficits in strength, transfers, gait, balance, and activity tolerance.  Pt required extra effort during transfers and reported feeling more confident with a RW for support during standing secondary to feeling weaker/more deconditioned than his baseline.  Pt was able to amb 125' with CGA with slow cadence and short B step length again reporting not feeling comfortable attempting to amb without the RW secondary to weakness.  Pt will benefit from HHPT services upon discharge to safely address above deficits for decreased risk of further functional decline and eventual return to PLOF.      Follow Up Recommendations Home health PT;Supervision for mobility/OOB    Equipment Recommendations  None recommended by PT    Recommendations for Other Services       Precautions / Restrictions Precautions Precautions: Fall;Other (comment) Precaution Comments: + influenza Restrictions Weight Bearing Restrictions: No      Mobility  Bed Mobility Overal bed mobility: Independent                Transfers Overall transfer level:  Needs assistance Equipment used: Rolling walker (2 wheeled) Transfers: Sit to/from Stand Sit to Stand: Supervision         General transfer comment: Fair stability and control during transfers  Ambulation/Gait   Gait Distance (Feet): 125 Feet Assistive device: Rolling walker (2 wheeled) Gait Pattern/deviations: Step-through pattern;Decreased step length - right;Decreased step length - left Gait velocity: Decreased   General Gait Details: Decreased cadence and short B step length but steady without LOB; SpO2 96% after amb on 2LO2/min with HR WNL  Stairs            Wheelchair Mobility    Modified Rankin (Stroke Patients Only)       Balance Overall balance assessment: Needs assistance   Sitting balance-Leahy Scale: Good     Standing balance support: Bilateral upper extremity supported;During functional activity Standing balance-Leahy Scale: Good Standing balance comment: Pt required BUE support for improved stability with standing activities                             Pertinent Vitals/Pain Pain Assessment: No/denies pain    Home Living Family/patient expects to be discharged to:: Private residence Living Arrangements: Other (Comment)(Live-in girlfriend) Available Help at Discharge: Friend(s);Available 24 hours/day Type of Home: Apartment Home Access: Level entry     Home Layout: One level Home Equipment: Walker - 2 wheels Additional Comments: Pt had a SPC and 4WW but gave them away, has a FWW that he can borrow from a friend as needed  Prior Function Level of Independence: Independent         Comments: Pt Ind with amb without AD limited community distances with one fall in the last 6 months, Ind with all ADLs     Hand Dominance   Dominant Hand: Right    Extremity/Trunk Assessment   Upper Extremity Assessment Upper Extremity Assessment: Generalized weakness    Lower Extremity Assessment Lower Extremity Assessment: Generalized  weakness       Communication   Communication: No difficulties  Cognition Arousal/Alertness: Awake/alert Behavior During Therapy: WFL for tasks assessed/performed Overall Cognitive Status: Within Functional Limits for tasks assessed                                        General Comments      Exercises Total Joint Exercises Ankle Circles/Pumps: Strengthening;Both;10 reps Quad Sets: Strengthening;Both;10 reps Gluteal Sets: Strengthening;Both;10 reps Heel Slides: AROM;Both;5 reps Hip ABduction/ADduction: AROM;Both;5 reps Straight Leg Raises: AROM;Both;5 reps Long Arc Quad: Strengthening;Both;10 reps Knee Flexion: Strengthening;Both;10 reps Marching in Standing: AROM;Both;10 reps   Assessment/Plan    PT Assessment Patient needs continued PT services  PT Problem List Decreased strength;Decreased activity tolerance;Decreased balance;Decreased knowledge of use of DME       PT Treatment Interventions DME instruction;Gait training;Therapeutic activities;Therapeutic exercise;Balance training;Patient/family education    PT Goals (Current goals can be found in the Care Plan section)  Acute Rehab PT Goals Patient Stated Goal: To get stronger PT Goal Formulation: With patient Time For Goal Achievement: 06/08/18 Potential to Achieve Goals: Good    Frequency Min 2X/week   Barriers to discharge        Co-evaluation               AM-PAC PT "6 Clicks" Mobility  Outcome Measure Help needed turning from your back to your side while in a flat bed without using bedrails?: None Help needed moving from lying on your back to sitting on the side of a flat bed without using bedrails?: None Help needed moving to and from a bed to a chair (including a wheelchair)?: None Help needed standing up from a chair using your arms (e.g., wheelchair or bedside chair)?: A Little Help needed to walk in hospital room?: A Little Help needed climbing 3-5 steps with a railing? : A  Little 6 Click Score: 21    End of Session Equipment Utilized During Treatment: Gait belt;Oxygen Activity Tolerance: Patient tolerated treatment well Patient left: in chair;with chair alarm set;with call bell/phone within reach;with family/visitor present Nurse Communication: Mobility status PT Visit Diagnosis: Unsteadiness on feet (R26.81);Difficulty in walking, not elsewhere classified (R26.2);Muscle weakness (generalized) (M62.81)    Time: 1436-1510 PT Time Calculation (min) (ACUTE ONLY): 34 min   Charges:   PT Evaluation $PT Eval Low Complexity: 1 Low PT Treatments $Therapeutic Exercise: 8-22 mins        D. Royetta Asal PT, DPT 05/26/18, 3:34 PM

## 2018-05-26 NOTE — Progress Notes (Signed)
PHARMACY NOTE:  ANTIMICROBIAL RENAL DOSAGE ADJUSTMENT  Current antimicrobial regimen includes a mismatch between antimicrobial dosage and estimated renal function.  As per policy approved by the Pharmacy & Therapeutics and Medical Executive Committees, the antimicrobial dosage will be adjusted accordingly.  Current antimicrobial dosage:  Tamiflu 30mg  PO BID  Indication: Influenza +  Renal Function:   Estimated Creatinine Clearance: 64.6 mL/min (by C-G formula based on SCr of 0.98 mg/dL). []      On intermittent HD, scheduled: []      On CRRT    Antimicrobial dosage has been changed to:  Tamiflu 75mg  PO BID  Additional comments:   Thank you for allowing pharmacy to be a part of this patient's care.  Paulina Fusi, PharmD, BCPS 05/26/2018 2:02 PM

## 2018-05-27 ENCOUNTER — Inpatient Hospital Stay: Payer: Medicare HMO

## 2018-05-27 LAB — BASIC METABOLIC PANEL
Anion gap: 7 (ref 5–15)
BUN: 33 mg/dL — ABNORMAL HIGH (ref 8–23)
CO2: 26 mmol/L (ref 22–32)
Calcium: 8.6 mg/dL — ABNORMAL LOW (ref 8.9–10.3)
Chloride: 100 mmol/L (ref 98–111)
Creatinine, Ser: 0.97 mg/dL (ref 0.61–1.24)
GFR calc Af Amer: >60 mL/min (ref >60)
GFR calc non Af Amer: >60 mL/min (ref >60)
Glucose, Bld: 227 mg/dL — ABNORMAL HIGH (ref 70–99)
Potassium: 5.4 mmol/L — ABNORMAL HIGH (ref 3.5–5.1)
Sodium: 133 mmol/L — ABNORMAL LOW (ref 135–145)

## 2018-05-27 LAB — GLUCOSE, CAPILLARY
GLUCOSE-CAPILLARY: 161 mg/dL — AB (ref 70–99)
Glucose-Capillary: 241 mg/dL — ABNORMAL HIGH (ref 70–99)
Glucose-Capillary: 190 mg/dL — ABNORMAL HIGH (ref 70–99)
Glucose-Capillary: 181 mg/dL — ABNORMAL HIGH (ref 70–99)

## 2018-05-27 MED ORDER — SODIUM ZIRCONIUM CYCLOSILICATE 10 G PO PACK
10.0000 g | PACK | Freq: Every day | ORAL | Status: AC
  Administered 2018-05-27 – 2018-05-28 (×2): 10 g via ORAL
  Filled 2018-05-27 (×2): qty 1

## 2018-05-27 MED ORDER — DOXYCYCLINE HYCLATE 100 MG PO TABS
100.0000 mg | ORAL_TABLET | Freq: Two times a day (BID) | ORAL | Status: DC
  Administered 2018-05-27 – 2018-05-28 (×3): 100 mg via ORAL
  Filled 2018-05-27 (×3): qty 1

## 2018-05-27 MED ORDER — FUROSEMIDE 20 MG PO TABS
20.0000 mg | ORAL_TABLET | Freq: Every day | ORAL | Status: DC
  Administered 2018-05-28: 20 mg via ORAL
  Filled 2018-05-27 (×2): qty 1

## 2018-05-27 NOTE — Progress Notes (Signed)
To Xray via bed

## 2018-05-27 NOTE — Plan of Care (Signed)
  Problem: Clinical Measurements: Goal: Ability to maintain clinical measurements within normal limits will improve Outcome: Progressing Goal: Will remain free from infection Outcome: Progressing Note:  Remains afebrile, on Tamiflu for Flu B Goal: Diagnostic test results will improve Outcome: Progressing   Problem: Clinical Measurements: Goal: Respiratory complications will improve Outcome: Not Progressing Note:  Remains on 2LO2 per [place], will try to wean today

## 2018-05-27 NOTE — Progress Notes (Signed)
Ambulated around nurses station on ra, oxygen saturation did not drop below 92%.  Will leave oxygen off for now.

## 2018-05-27 NOTE — Progress Notes (Signed)
Talked to Dr. Tressia Miners about patient's bp of 102/63,order to hold lasix p.o. RN will continue to monitor.

## 2018-05-27 NOTE — Progress Notes (Signed)
[facility] [place] NAME: John Frank    MR#:  [id number]  DATE OF BIRTH:  26-Nov-1955  SUBJECTIVE:  CHIEF COMPLAINT:   Chief Complaint  Patient presents with  . Nausea   -Still with coarse breath sounds.  Remains on 2 L oxygen which is acute -No fevers today  REVIEW OF SYSTEMS:  Review of Systems  Constitutional: Positive for malaise/fatigue. Negative for chills and fever.  HENT: Negative for congestion, ear discharge, hearing loss and nosebleeds.   Eyes: Negative for blurred vision and double vision.  Respiratory: Positive for cough and shortness of breath. Negative for wheezing.   Cardiovascular: Negative for chest pain and palpitations.  Gastrointestinal: Negative for abdominal pain, constipation, diarrhea, nausea and vomiting.  Genitourinary: Negative for dysuria.  Musculoskeletal: Negative for myalgias.  Neurological: Negative for dizziness, focal weakness, seizures, weakness and headaches.  Psychiatric/Behavioral: Negative for depression.    DRUG ALLERGIES:  No Known Allergies  VITALS:  Blood pressure 106/74, pulse 69, temperature 97.7 F (36.5 C), resp. rate 20, height 5\' 4"  (1.626 m), weight 65.8 kg, SpO2 95 %.  PHYSICAL EXAMINATION:  Physical Exam   GENERAL:  63 y.o.-year-old patient lying in the bed with no acute distress.  EYES: Pupils equal, round, reactive to light and accommodation. No scleral icterus. Extraocular muscles intact.  HEENT: Head atraumatic, normocephalic. Oropharynx and nasopharynx clear.  NECK:  Supple, no jugular venous distention. No thyroid enlargement, no tenderness.  LUNGS: Coarse rhonchorous breath sounds all over the lung fields worse at the bases, no rails or crepitation.. No use of accessory muscles of respiration.  CARDIOVASCULAR: S1, S2 normal. No murmurs, rubs, or gallops.  ABDOMEN: Soft, nontender, nondistended. Bowel sounds present. No organomegaly or mass.  EXTREMITIES: No pedal  edema, cyanosis, or clubbing.  NEUROLOGIC: Cranial nerves II through XII are intact. Muscle strength 5/5 in all extremities. Sensation intact. Gait not checked.  PSYCHIATRIC: The patient is alert and oriented x 3.  SKIN: No obvious rash, lesion, or ulcer.    LABORATORY PANEL:   CBC Recent Labs  Lab 05/26/18 0541  WBC 5.8  HGB 12.2*  HCT 37.3*  PLT 138*   ------------------------------------------------------------------------------------------------------------------  Chemistries  Recent Labs  Lab 05/25/18 1415 05/26/18 0541  NA 133* 132*  K 4.7 4.9  CL 100 100  CO2 22 23  GLUCOSE 153* 319*  BUN 26* 27*  CREATININE 1.15 0.98  CALCIUM 9.0 8.6*  AST 35  --   ALT 18  --   ALKPHOS 96  --   BILITOT 1.0  --    ------------------------------------------------------------------------------------------------------------------  Cardiac Enzymes Recent Labs  Lab 05/25/18 1415  TROPONINI <0.03   ------------------------------------------------------------------------------------------------------------------  RADIOLOGY:  Dg Chest 2 View  Result Date: 05/25/2018 CLINICAL DATA:  Productive cough. EXAM: CHEST - 2 VIEW COMPARISON:  Radiographs of January 14, 2018. FINDINGS: The heart size and mediastinal contours are within normal limits. Both lungs are clear. No pneumothorax or pleural effusion is noted. Left-sided pacemaker is unchanged in position. The visualized skeletal structures are unremarkable. IMPRESSION: No active cardiopulmonary disease. Electronically Signed   By: Marijo Conception, M.D.   On: 05/25/2018 14:52    EKG:   Orders placed or performed during the hospital encounter of 05/25/18  . ED EKG 12-Lead  . ED EKG 12-Lead  . EKG 12-Lead  . EKG 12-Lead    ASSESSMENT AND PLAN:   John Frank  is a 63 y.o. male with a known history of  COPD not on home oxygen, ischemic cardiomyopathy with EF of 25%, diabetes, CAD, hypertension presents to hospital secondary to  worsening cough, shortness of breath and nausea for 3 days.  1.  Acute on chronic COPD exacerbation-not on home oxygen. -Currently needing 2.5 L oxygen acutely.  Also has influenza be positive illness. -Continue steroids, nebs and inhalers. -Chest x-ray is clear, procalcitonin is negative.  We will hold off on antibiotics - -on Tamiflu. -Symptomatic treatment for cough -Wean oxygen as tolerated  2.  CAD-stable.  Continue aspirin, Plavix statin and cardiac medications. -Denies any chest pain  3.  Ischemic cardiomyopathy, CHF EF of 25%-well compensated  .  Continue to monitor -Continue other cardiac medications including Entresto -Restart Lasix at discharge  4.  GERD-Protonix  5.  Diabetes mellitus-on metformin and Tradjenta, added sliding scale insulin.  6.  DVT prophylaxis-on Lovenox  Physical therapy recommended home health.  Possible discharge tomorrow once weaned off oxygen   All the records are reviewed and case discussed with Care Management/Social Workerr. Management plans discussed with the patient, family and they are in agreement.  CODE STATUS: Full code  TOTAL TIME TAKING CARE OF THIS PATIENT: 37 minutes.   POSSIBLE D/C IN 1-2 DAYS, DEPENDING ON CLINICAL CONDITION.   Gladstone Lighter M.D on 05/27/2018 at 10:28 AM  Between 7am to 6pm - Pager - [phone]  After 6pm go to [email or web address] - password [email or web address]  Office  [phone]  CC: Primary care physician; Idelle Crouch, MD

## 2018-05-27 NOTE — Progress Notes (Signed)
Back from xray

## 2018-05-28 LAB — GLUCOSE, CAPILLARY
Glucose-Capillary: 235 mg/dL — ABNORMAL HIGH (ref 70–99)
Glucose-Capillary: 165 mg/dL — ABNORMAL HIGH (ref 70–99)

## 2018-05-28 MED ORDER — OSELTAMIVIR PHOSPHATE 75 MG PO CAPS: 75.0000 mg | ORAL_CAPSULE | Freq: Two times a day (BID) | ORAL | 0 refills | Status: AC

## 2018-05-28 MED ORDER — DOXYCYCLINE HYCLATE 100 MG PO TABS: 100.0000 mg | ORAL_TABLET | Freq: Two times a day (BID) | ORAL | 0 refills | Status: AC

## 2018-05-28 MED ORDER — PREDNISONE 20 MG PO TABS: 40.0000 mg | ORAL_TABLET | Freq: Every day | ORAL | 0 refills | Status: AC

## 2018-05-28 NOTE — Discharge Summary (Signed)
[facility] at [place] NAME: John Frank    MR#:  [id number]  DATE OF BIRTH:  Mar 30, 1956  DATE OF ADMISSION:  05/25/2018   ADMITTING PHYSICIAN: Gladstone Lighter, MD  DATE OF DISCHARGE:  05/28/18  PRIMARY CARE PHYSICIAN: Idelle Crouch, MD   ADMISSION DIAGNOSIS:   Influenza B [J10.1] Acute respiratory failure with hypoxia ([place]) [J96.01] Chronic obstructive pulmonary disease with acute exacerbation (HCC) [J44.1]  DISCHARGE DIAGNOSIS:   Active Problems:   COPD exacerbation ([place])   SECONDARY DIAGNOSIS:   Past Medical History:  Diagnosis Date  . CAD (coronary artery disease)    s/p stents in 2017  . CHF (congestive heart failure) ([place])   . COPD (chronic obstructive pulmonary disease) ([place])   . Diabetes ([place])   . GERD (gastroesophageal reflux disease)   . Heart attack ([place])   . Hyperlipidemia   . Hypertension   . Presence of permanent cardiac pacemaker    and defibrillator  . Shortness of breath dyspnea   . Sleep apnea     HOSPITAL COURSE:   RogerWalleris a63 y.o.malewith a known history of COPD not on home oxygen, ischemic cardiomyopathy with EF of 25%, diabetes, CAD, hypertension presents to hospital secondary to worsening cough, shortness of breath and nausea for 3 days.  1.Acute on chronic COPD exacerbation-not on home oxygen. -Required oxygen acutely in the hospital.  Currently weaned off. -.  Also has influenza B illness-on Tamiflu -Continue steroids, nebs and inhalers. -Chest x-ray is clear clear but more productive cough here, repeat chest x-ray showing the lower lobe infiltrate.  Started on doxycycline.  -Symptomatic treatment for cough -Feeling much better today.  2. CAD-stable. Continue aspirin, Plavix statin and cardiac medications. -Denies any chest pain  3. Ischemic cardiomyopathy, CHF EF of 25%-well compensated  . Continue to monitor -Continue other cardiac medications including  Entresto -Restart Lasix and Aldactone at discharge - advised to hold both meds if BP remains soft- home health to assist as well.  4. GERD- PPI  5. Diabetes mellitus-on metformin and januvia .   Physical therapy recommended home health.  discharge today  DISCHARGE CONDITIONS:   Gu[place]  CONSULTS OBTAINED:   None  DRUG ALLERGIES:   No Known Allergies DISCHARGE MEDICATIONS:   Allergies as of 05/28/2018   No Known Allergies     Medication List    TAKE these medications   albuterol 108 (90 Base) MCG/ACT inhaler Commonly known as:  PROVENTIL HFA;VENTOLIN HFA Inhale 2 puffs into the lungs every 6 (six) hours as needed for wheezing or shortness of breath. What changed:  Another medication with the same name was removed. Continue taking this medication, and follow the directions you see here.   aspirin EC 81 MG tablet Take 81 mg by mouth daily.   atorvastatin 40 MG tablet Commonly known as:  LIPITOR TAKE 1 TABLET (40 MG TOTAL) BY MOUTH DAILY.   budesonide-formoterol 160-4.5 MCG/ACT inhaler Commonly known as:  SYMBICORT Inhale 2 puffs into the lungs 2 (two) times daily.   buPROPion 75 MG tablet Commonly known as:  WELLBUTRIN Take 75 mg by mouth daily.   clopidogrel 75 MG tablet Commonly known as:  PLAVIX TAKE 1 TABLET (75 MG TOTAL) BY MOUTH DAILY.   doxycycline 100 MG tablet Commonly known as:  VIBRA-TABS Take 1 tablet (100 mg total) by mouth every 12 (twelve) hours for 5 days.   famotidine 40 MG tablet Commonly known as:  PEPCID TAKE 1 TABLET BY  MOUTH EVERY DAY   furosemide 40 MG tablet Commonly known as:  LASIX Take 1 tablet (40 mg total) by mouth daily.   ipratropium-albuterol 0.5-2.5 (3) MG/3ML Soln Commonly known as:  DUONEB Take 3 mLs by nebulization every 6 (six) hours as needed (wheezing; shortness of breath).   JANUVIA 100 MG tablet Generic drug:  sitaGLIPtin TAKE 1 TABLET (100 MG TOTAL) BY MOUTH DAILY.   metFORMIN 1000 MG  tablet Commonly known as:  GLUCOPHAGE TAKE 1 TABLET (1,000 MG TOTAL) BY MOUTH 2 (TWO) TIMES DAILY WITH A MEAL.   metoprolol succinate 100 MG 24 hr tablet Commonly known as:  TOPROL-XL Take 100 mg by mouth daily.   ONE TOUCH ULTRA TEST test strip Generic drug:  glucose blood USE TO TEST BLOOD SUGAR ONCE DAILY   oseltamivir 75 MG capsule Commonly known as:  TAMIFLU Take 1 capsule (75 mg total) by mouth 2 (two) times daily for 2 days.   pantoprazole 40 MG tablet Commonly known as:  PROTONIX Take 1 tablet (40 mg total) by mouth every morning. What changed:  when to take this   predniSONE 20 MG tablet Commonly known as:  DELTASONE Take 2 tablets (40 mg total) by mouth daily for 5 days.   sacubitril-valsartan 24-26 MG Commonly known as:  ENTRESTO Take 1 tablet by mouth 2 (two) times daily.   spironolactone 25 MG tablet Commonly known as:  ALDACTONE Take 0.5 tablets (12.5 mg total) by mouth daily.   sucralfate 1 g tablet Commonly known as:  CARAFATE Take 1 g by mouth 4 (four) times daily -  with meals and at bedtime.            Durable Medical Equipment  (From admission, onward)         Start     Ordered   05/27/18 1707  For home use only DME continuous positive airway pressure (CPAP)  Once    Question Answer Comment  Patient has OSA or probable OSA Yes   Settings Other see comments   Signs and symptoms of probable OSA  (select all that apply) Witnessed apneas   CPAP supplies needed Mask, headgear, cushions, filters, heated tubing and water chamber      05/27/18 1707           DISCHARGE INSTRUCTIONS:   1. PCP f/u in 1-2 weeks 2. Cardiology f/u in 2 weeks  DIET:   Cardiac diet  ACTIVITY:   Activity as tolerated  OXYGEN:   Home Oxygen: No.  Oxygen Delivery: room air  DISCHARGE LOCATION:   home   If you experience worsening of your admission symptoms, develop shortness of breath, life threatening emergency, suicidal or homicidal thoughts you  must seek medical attention immediately by calling 911 or calling your MD immediately  if symptoms less severe.  You Must read complete instructions/literature along with all the possible adverse reactions/side effects for all the Medicines you take and that have been prescribed to you. Take any new Medicines after you have completely understood and accpet all the possible adverse reactions/side effects.   Please note  You were cared for by a hospitalist during your hospital stay. If you have any questions about your discharge medications or the care you received while you were in the hospital after you are discharged, you can call the unit and asked to speak with the hospitalist on call if the hospitalist that took care of you is not available. Once you are discharged, your primary care physician will  handle any further medical issues. Please note that NO REFILLS for any discharge medications will be authorized once you are discharged, as it is imperative that you return to your primary care physician (or establish a relationship with a primary care physician if you do not have one) for your aftercare needs so that they can reassess your need for medications and monitor your lab values.    On the day of Discharge:  VITAL SIGNS:   Blood pressure 108/68, pulse 66, temperature 97.6 F (36.4 C), temperature source Oral, resp. rate 16, height 5\' 4"  (1.626 m), weight 65.6 kg, SpO2 95 %.  PHYSICAL EXAMINATION:    GENERAL:  63 y.o.-year-old patient lying in the bed with no acute distress.  EYES: Pupils equal, round, reactive to light and accommodation. No scleral icterus. Extraocular muscles intact.  HEENT: Head atraumatic, normocephalic. Oropharynx and nasopharynx clear.  NECK:  Supple, no jugular venous distention. No thyroid enlargement, no tenderness.  LUNGS: Coarse rhonchorous breath sounds improving today , no rales or crepitation.. No use of accessory muscles of respiration.  CARDIOVASCULAR:  S1, S2 normal. No murmurs, rubs, or gallops.  ABDOMEN: Soft, nontender, nondistended. Bowel sounds present. No organomegaly or mass.  EXTREMITIES: No pedal edema, cyanosis, or clubbing.  NEUROLOGIC: Cranial nerves II through XII are intact. Muscle strength 5/5 in all extremities. Sensation intact. Gait not checked.  PSYCHIATRIC: The patient is alert and oriented x 3.  SKIN: No obvious rash, lesion, or ulcer.   DATA REVIEW:   CBC Recent Labs  Lab 05/26/18 0541  WBC 5.8  HGB 12.2*  HCT 37.3*  PLT 138*    Chemistries  Recent Labs  Lab 05/25/18 1415  05/27/18 1115  NA 133*   < > 133*  K 4.7   < > 5.4*  CL 100   < > 100  CO2 22   < > 26  GLUCOSE 153*   < > 227*  BUN 26*   < > 33*  CREATININE 1.15   < > 0.97  CALCIUM 9.0   < > 8.6*  AST 35  --   --   ALT 18  --   --   ALKPHOS 96  --   --   BILITOT 1.0  --   --    < > = values in this interval not displayed.     Microbiology Results  Results for orders placed or performed during the hospital encounter of 05/25/18  Blood Culture (routine x 2)     Status: None (Preliminary result)   Collection Time: 05/25/18  3:07 PM  Result Value Ref Range Status   Specimen Description BLOOD LEFT ARM  Final   Special Requests   Final    BOTTLES DRAWN AEROBIC AND ANAEROBIC Blood Culture adequate volume   Culture   Final    NO GROWTH 3 DAYS Performed at [facility], [address] [place], [place] [address]    Report Status PENDING  Incomplete  Blood Culture (routine x 2)     Status: None (Preliminary result)   Collection Time: 05/25/18  3:07 PM  Result Value Ref Range Status   Specimen Description BLOOD RIGHT ARM  Final   Special Requests   Final    BOTTLES DRAWN AEROBIC AND ANAEROBIC Blood Culture adequate volume   Culture   Final    NO GROWTH 3 DAYS Performed at [facility], [address] [place], [place] [address]    Report Status PENDING  Incomplete  Urine culture     Status: None   Collection Time:  05/25/18  3:07 PM  Result Value Ref Range Status   Specimen Description   Final    URINE, RANDOM Performed at [facility], [address], [place], [place] [address]    Special Requests   Final    NONE Performed at [facility], [address], [place], [place] [address]    Culture   Final    NO GROWTH Performed at [facility], [place] [address] [place], [place] [address]    Report Status 05/26/2018 FINAL  Final    RADIOLOGY:  Dg Chest 2 View  Result Date: 05/27/2018 CLINICAL DATA:  Dyspnea, cough EXAM: CHEST - 2 VIEW COMPARISON:  05/25/2018 FINDINGS: Heart size and vascularity normal.  AICD unchanged in position. Mild left lower lobe airspace disease is new and could represent atelectasis or pneumonia. No effusion. Right lung clear. IMPRESSION: Question early pneumonia left lung base. Electronically Signed   By: Franchot Gallo M.D.   On: 05/27/2018 12:55     Management plans discussed with the patient, family and they are in agreement.  CODE STATUS:     Code Status Orders  (From admission, onward)         Start     Ordered   05/25/18 1926  Full code  Continuous     05/25/18 1925        Code Status History    Date Active Date Inactive Code Status Order ID Comments User Context   08/09/2017 1444 08/11/2017 1613 Full Code [id number]  Gladstone Lighter, MD Inpatient   05/26/2017 0211 05/26/2017 1627 Full Code [id number]  Lance Coon, MD Inpatient   12/24/2014 0728 12/25/2014 1359 Full Code [id number]  Hillary Bow, MD ED      TOTAL TIME TAKING CARE OF THIS PATIENT: 38 minutes.    Gladstone Lighter M.D on 05/28/2018 at 10:26 AM  Between 7am to 6pm - Pager - [phone]  After 6pm go to [email or web address] - Proofreader  [facility]  Office  [phone]  CC: Primary care physician; Idelle Crouch, MD   Note: This dictation was prepared with Dragon dictation along with smaller phrase technology. Any  transcriptional errors that result from this process are unintentional.

## 2018-05-28 NOTE — Care Management Note (Signed)
Case Management Note  Patient Details  Name: John Frank MRN: [id number] Date of Birth: 1955-11-04  Subjective/Objective:    Patient is from home with friend.  He was admitted for positive flu.  History of CAD, CHF and COPD.  He is discharging today with [facility].  Referral made and accepted by [facility].   No eqiuptment needs.  Current with Dr. Doy Hutching.  Denies difficulties obtaining medications or accessing medical care.  He has a functioning scale and weighs daily.  Current  With the heart failure clinic.  [facility] referral made as well as he is in the registry.  No further needs identified at this time.               Action/Plan:   Expected Discharge Date:  05/28/18               Expected Discharge Plan:  [place]  In-House Referral:     Discharge planning Services  CM Consult  Post Acute Care Choice:  Home Health Choice offered to:  Patient  DME Arranged:    DME Agency:     HH Arranged:  PT [place]:  [place]  Status of Service:  Completed, signed off  If discussed at [place] of Stay Meetings, dates discussed:    Additional Comments:  Elza Rafter, RN 05/28/2018, 11:51 AM

## 2018-05-28 NOTE — Progress Notes (Signed)
Inpatient Diabetes Program Recommendations  AACE/ADA: New Consensus Statement on Inpatient Glycemic Control   Target Ranges:  Prepandial:   less than 140 mg/dL      Peak postprandial:   less than 180 mg/dL (1-2 hours)      Critically ill patients:  140 - 180 mg/dL   Results for DAMIER, DISANO (MRN [id number]) as of 05/28/2018 09:23  Ref. Range 05/27/2018 08:16 05/27/2018 11:47 05/27/2018 17:02 05/27/2018 21:47 05/28/2018 07:26  Glucose-Capillary Latest Ref Range: 70 - 99 mg/dL 241 (H) 190 (H) 161 (H) 181 (H) 235 (H)   Review of Glycemic Control  Diabetes history:DM2 Outpatient Diabetes medications: Metformin 1000 mg BID, Januvia 100 mg daily Current orders for Inpatient glycemic control: Novolog 0-9 units TID with meals, Novolog 0-5 units QHS, Metformin 1000 mg BID, Tradjenta 5 mg daily; Solumedrol 60 mg Q24H  Inpatient Diabetes Program Recommendations:  Insulin - Meal Coverage: If steroids are continued, please consider ordering Novolog 3 units TID with meals if patient eats at least 50% of meals.  Thanks, Barnie Alderman, RN, MSN, CDE Diabetes Coordinator Inpatient Diabetes Program [phone] (Team Pager from 8am to 5pm)

## 2018-05-28 NOTE — Discharge Instructions (Signed)
Please hold lasix and entresto if BP is low at home.

## 2018-05-28 NOTE — Progress Notes (Signed)
Discharged to home.  A friend is taking him.  Follow up appointments made.  Prescription e scribed and should be ready for pick up

## 2018-05-30 LAB — CULTURE, BLOOD (ROUTINE X 2)
Culture: NO GROWTH
Culture: NO GROWTH
Special Requests: ADEQUATE
Special Requests: ADEQUATE

## 2018-06-02 ENCOUNTER — Other Ambulatory Visit: Payer: Self-pay | Admitting: Gastroenterology

## 2018-06-02 DIAGNOSIS — R1013 Epigastric pain: Secondary | ICD-10-CM

## 2018-06-06 NOTE — Progress Notes (Deleted)
Patient ID: John Frank, male    DOB: 1955/06/10, 63 y.o.   MRN: [id number]  HPI  Mr John Frank is a 63 y/o male with a history of CAD, DM, hyperlipidemia, HTN, GERD, COPD, obstructive sleep apnea, chronic heart failure and current tobacco use.   Echo report from 04/13/17 reviewed and showed an EF of 30% along with mild MR/TR.  Was in the ED 01/14/18 due to acute bronchitis where he was treated and released. Admitted 08/09/17 due to acute on chronic HF. Initially needed IV Lasix and then transitioned to oral diuretics. Paracentesis was done due to ascites and antibiotics were also given. Discharged after 2 days.   He presents today for a follow-up visit with a chief complaint of minimal shortness of breath upon moderate exertion. He describes this as chronic in nature having been present for several years. He has associated cough, chest pain, light-headedness and gradual weight gain along with this. He denies any difficulty sleeping, abdominal distention, palpitations, pedal edema or fatigue.   Past Medical History:  Diagnosis Date  . CAD (coronary artery disease)    s/p stents in 2017  . CHF (congestive heart failure) ([place])   . COPD (chronic obstructive pulmonary disease) ([place])   . Diabetes ([place])   . GERD (gastroesophageal reflux disease)   . Heart attack ([place])   . Hyperlipidemia   . Hypertension   . Presence of permanent cardiac pacemaker    and defibrillator  . Shortness of breath dyspnea   . Sleep apnea    Past Surgical History:  Procedure Laterality Date  . cardiac stents    . CARDIAC SURGERY     defibrillator and pacemaker  . CORONARY ANGIOPLASTY    . ESOPHAGOGASTRODUODENOSCOPY N/A 09/19/2014   Procedure: ESOPHAGOGASTRODUODENOSCOPY (EGD);  Surgeon: John Lame, MD;  Location: [facility] ENDOSCOPY;  Service: Endoscopy;  Laterality: N/A;  . ESOPHAGOGASTRODUODENOSCOPY (EGD) WITH PROPOFOL N/A 09/28/2017   Procedure: ESOPHAGOGASTRODUODENOSCOPY (EGD) WITH PROPOFOL;  Surgeon: John Manifold, MD;  Location: [facility] ENDOSCOPY;  Service: Endoscopy;  Laterality: N/A;  . ESOPHAGOGASTRODUODENOSCOPY (EGD) WITH PROPOFOL N/A 04/19/2018   Procedure: ESOPHAGOGASTRODUODENOSCOPY (EGD) WITH PROPOFOL with Gastric Mapping;  Surgeon: John Bellows, MD;  Location: [facility] ENDOSCOPY;  Service: Gastroenterology;  Laterality: N/A;   Family History  Problem Relation Age of Onset  . Diabetes Mother   . Heart attack Father 38   Social History   Tobacco Use  . Smoking status: Current Every Day Smoker    Packs/day: 1.00    Years: 40.00    Pack years: 40.00    Types: Cigarettes  . Smokeless tobacco: Former Network engineer Use Topics  . Alcohol use: No   No Known Allergies  Prior to Admission medications   Medication Sig Start Date End Date Taking? Authorizing Provider  albuterol (PROVENTIL HFA;VENTOLIN HFA) 108 (90 Base) MCG/ACT inhaler Inhale 2 puffs into the lungs every 6 (six) hours as needed for wheezing or shortness of breath. 05/26/17  Yes Henreitta Leber, MD  albuterol (PROVENTIL) (2.5 MG/3ML) 0.083% nebulizer solution Take 2.5 mg by nebulization every 6 (six) hours as needed for wheezing or shortness of breath.   Yes [provider]  aspirin EC 81 MG tablet Take 81 mg by mouth daily.   Yes [provider]  atorvastatin (LIPITOR) 40 MG tablet TAKE 1 TABLET (40 MG TOTAL) BY MOUTH DAILY. 03/08/18  Yes Cannady, Jolene T, NP  budesonide-formoterol (SYMBICORT) 160-4.5 MCG/ACT inhaler Inhale 2 puffs into the lungs 2 (two) times daily.  06/15/15  Yes Kathrine Haddock, NP  buPROPion (WELLBUTRIN) 75 MG tablet Take 75 mg by mouth daily. 03/26/16 05/08/18 Yes [provider]  clopidogrel (PLAVIX) 75 MG tablet TAKE 1 TABLET (75 MG TOTAL) BY MOUTH DAILY. 12/08/16  Yes Kathrine Haddock, NP  famotidine (PEPCID) 40 MG tablet TAKE 1 TABLET BY MOUTH EVERY DAY 09/09/17  Yes Kathrine Haddock, NP  furosemide (LASIX) 40 MG tablet Take 1 tablet (40 mg total) by mouth daily. 08/11/17  Yes Wieting,  Richard, MD  ipratropium-albuterol (DUONEB) 0.5-2.5 (3) MG/3ML SOLN Take 3 mLs by nebulization every 6 (six) hours as needed (wheezing; shortness of breath).   Yes [provider]  JANUVIA 100 MG tablet TAKE 1 TABLET (100 MG TOTAL) BY MOUTH DAILY. 09/14/17  Yes Kathrine Haddock, NP  lisinopril (PRINIVIL,ZESTRIL) 20 MG tablet Take 20 mg by mouth daily.   Yes [provider]  metFORMIN (GLUCOPHAGE) 1000 MG tablet TAKE 1 TABLET (1,000 MG TOTAL) BY MOUTH 2 (TWO) TIMES DAILY WITH A MEAL. 09/12/16  Yes Kathrine Haddock, NP  metoprolol succinate (TOPROL-XL) 100 MG 24 hr tablet Take 100 mg by mouth daily.  08/19/17  Yes [provider]  ONE TOUCH ULTRA TEST test strip USE TO TEST BLOOD SUGAR ONCE DAILY 10/07/16  Yes Frank, John P, DO  pantoprazole (PROTONIX) 40 MG tablet Take 1 tablet (40 mg total) by mouth every morning. Patient taking differently: Take 40 mg by mouth daily before breakfast.  06/29/17  Yes John Bellows, MD  spironolactone (ALDACTONE) 25 MG tablet Take 0.5 tablets (12.5 mg total) by mouth daily. 12/29/17  Yes John Frank A, FNP  sucralfate (CARAFATE) 1 g tablet Take 1 g by mouth 4 (four) times daily -  with meals and at bedtime.  05/15/17 05/15/18 Yes [provider]    Review of Systems  Constitutional: Negative for appetite change, fatigue and fever.  HENT: Negative for congestion, postnasal drip and sore throat.   Eyes: Negative.   Respiratory: Positive for cough and shortness of breath. Negative for chest tightness and wheezing.   Cardiovascular: Positive for chest pain (at times with exertion). Negative for palpitations and leg swelling.  Gastrointestinal: Negative for abdominal distention and abdominal pain.  Endocrine: Negative.   Genitourinary: Negative.   Musculoskeletal: Negative for back pain and neck pain.  Skin: Negative.   Allergic/Immunologic: Negative.   Neurological: Positive for light-headedness (at times). Negative for dizziness and  headaches.  Hematological: Negative for adenopathy. Does not bruise/bleed easily.  Psychiatric/Behavioral: Negative for dysphoric mood and sleep disturbance (wearing CPAP ). The patient is not nervous/anxious.     Physical Exam  Constitutional: He is oriented to person, place, and time. He appears well-developed and well-nourished.  HENT:  Head: Normocephalic and atraumatic.  Neck: Normal range of motion. Neck supple. No JVD present.  Cardiovascular: Normal rate and regular rhythm.  Pulmonary/Chest: Effort normal. He has no wheezes. He has rhonchi in the right lower field and the left lower field. He has no rales.  Abdominal: Soft. He exhibits no distension. There is no abdominal tenderness.  Musculoskeletal:        General: No tenderness or edema.  Neurological: He is alert and oriented to person, place, and time.  Skin: Skin is warm and dry.  Psychiatric: He has a normal mood and affect. His behavior is normal. Thought content normal.  Nursing note and vitals reviewed.  Assessment & Plan:  1: Chronic heart failure with reduced ejection fraction- - NYHA class II - euvolemic today -  weighing daily and he was reminded to call for an overnight weight gain of >2 pounds or a weekly weight gain of >5 pounds.  - weight  - not adding salt; using Mrs. Dash; admits that he's not reading food labels; reminded him to keep his daily sodium intake to 2000mg  a day. Does eat quite a bit of processed food; rinses canned vegetables out - currently drinking a 1/2L soda, ~20 ounces of tea and 2-3 cups of coffee daily. Drinking 1 bottle of water daily - saw cardiology Clayborn Bigness) 08/19/17 - saw pulmonologist Raul Del) 06/22/17 - BNP 08/09/17 was 756.0 - currently has pacemaker/defibrillator  - entresto started at last visit - will not check BMP today since it was done ~ 10 days ago - PharmD reconciled medications with the patient  2: HTN- - BP  - saw PCP ([place]) 10/12/17 - BMP 05/27/2018 reviewed and  showed sodium 133, potassium 5.4, creatinine 0.97 and GFR >60  3: Diabetes- - hasn't checked his glucose in a few days - A1c 05/25/2018 was 7.2%  4: Tobacco use- - currently smoking 1-2 cigarettes daily  - complete cessation discussed for 3 minutes with patient  Patient did not bring his medications nor a list. Each medication was verbally reviewed with the patient and he was encouraged to bring the bottles to every visit to confirm accuracy of list. Emphasized the importance of bringing his medication bottles to very visit.

## 2018-06-07 ENCOUNTER — Other Ambulatory Visit: Payer: Self-pay | Admitting: Unknown Physician Specialty

## 2018-06-08 ENCOUNTER — Ambulatory Visit: Payer: Medicare Other | Admitting: Family

## 2018-06-09 ENCOUNTER — Ambulatory Visit (INDEPENDENT_AMBULATORY_CARE_PROVIDER_SITE_OTHER): Payer: Medicare HMO | Admitting: Gastroenterology

## 2018-06-09 ENCOUNTER — Other Ambulatory Visit: Payer: Self-pay

## 2018-06-09 ENCOUNTER — Encounter: Payer: Self-pay | Admitting: Gastroenterology

## 2018-06-09 VITALS — BP 122/68 | HR 71 | Ht 64.0 in | Wt 155.4 lb

## 2018-06-09 DIAGNOSIS — R194 Change in bowel habit: Secondary | ICD-10-CM | POA: Diagnosis not present

## 2018-06-09 DIAGNOSIS — K3189 Other diseases of stomach and duodenum: Secondary | ICD-10-CM

## 2018-06-09 DIAGNOSIS — K7460 Unspecified cirrhosis of liver: Secondary | ICD-10-CM | POA: Diagnosis not present

## 2018-06-09 DIAGNOSIS — K2970 Gastritis, unspecified, without bleeding: Secondary | ICD-10-CM | POA: Diagnosis not present

## 2018-06-09 DIAGNOSIS — R188 Other ascites: Secondary | ICD-10-CM | POA: Diagnosis not present

## 2018-06-09 NOTE — Progress Notes (Signed)
John Bellows MD, MRCP(U.K) [address]  [place]  [place], [place] [address]  Main: [phone]  Fax: [phone]   Primary Care Physician: Idelle Crouch, MD  Primary Gastroenterologist:  Dr. Jonathon Frank   No chief complaint on file.   HPI: John Frank is a 63 y.o. male   Summary of history : He has been seen previously for constipation.  Also been seen for ascites likely felt probably a combination of heart failure/cirhosis.  He was admitted in 08/2017 for CHF , EF 30% , right pleural effusion and ascites. Treated with diuresis , was noted to have ascites and a tap after giving him antibiotics showed high white cell count. GI was not consulted. CT abdomen showed ascites, possible early cirrhosis, right pleural effusion. Ascites fluid culture was negative . Ascitic fluid wcc 664 with 16% neutrophils. SAAG 1.6 which indicates possible portal hypertension.   08/03/17- Iron studies indicate possible deficiency. Normal B12, folate.H pylori stool antigen negative.  EGD in 2016 - normal. Ct abdomen in 01/2017 sowed right inguinal hernia. Labs 05/2017 : Hb 10.9 and MCV [id number] metabolic panel-normal. HIV negative . H/o GERD,COPD   Interval history4/17/19-1/29/2020  04/19/2018 : Large amount of food in the stomach - repeat requested but did not reschedule. No esophageal varices seen.  Recently admitted to the hospital for Influenza B , COPD , recent syncopal episode.   He says a few weeks back had diarrhea -was ongoing for 6 months and then stopped - now has normal bowel movements. Says "very good" no blood in stool.   Cant recall having a colonoscopy. Prior EGD showed gastric intestinal metaplasia.   CBC Latest Ref Rng & Units 05/26/2018 05/25/2018 09/18/2017  WBC 4.0 - 10.5 K/uL 5.8 7.2 7.4  Hemoglobin 13.0 - 17.0 g/dL 12.2(L) 13.2 11.8(L)  Hematocrit 39.0 - 52.0 % 37.3(L) 39.7 37.1(L)  Platelets 150 - 400 K/uL 138(L) 159 230   Hepatic Function Latest Ref Rng  & Units 05/25/2018 09/18/2017 08/09/2017  Total Protein 6.5 - 8.1 g/dL 7.8 7.6 7.4  Albumin 3.5 - 5.0 g/dL 4.4 4.3 3.7  AST 15 - 41 U/L 35 23 30  ALT 0 - 44 U/L 18 15(L) 18  Alk Phosphatase 38 - 126 U/L 96 97 92  Total Bilirubin 0.3 - 1.2 mg/dL 1.0 0.9 0.8     Weight has been stable. Feels better, no shortness of breath . Legs swelling has resolved.   Seen by Dr Clayborn Bigness in 08/2017 his cardiologist and has scheduled follow up in 6 months  He is on aldactone and lasix. No canned foods, no frozen meals . Occasional  roast beef sandwiches . Uses Dash . Says he is on Plavix.     Current Outpatient Medications  Medication Sig Dispense Refill  . albuterol (PROVENTIL HFA;VENTOLIN HFA) 108 (90 Base) MCG/ACT inhaler Inhale 2 puffs into the lungs every 6 (six) hours as needed for wheezing or shortness of breath. 1 Inhaler 2  . aspirin EC 81 MG tablet Take 81 mg by mouth daily.    Marland Kitchen atorvastatin (LIPITOR) 40 MG tablet TAKE 1 TABLET (40 MG TOTAL) BY MOUTH DAILY. 90 tablet 1  . budesonide-formoterol (SYMBICORT) 160-4.5 MCG/ACT inhaler Inhale 2 puffs into the lungs 2 (two) times daily. 1 Inhaler 3  . buPROPion (WELLBUTRIN) 75 MG tablet Take 75 mg by mouth daily.    . clopidogrel (PLAVIX) 75 MG tablet TAKE 1 TABLET (75 MG TOTAL) BY MOUTH DAILY. 90 tablet 3  . famotidine (  PEPCID) 40 MG tablet TAKE 1 TABLET BY MOUTH EVERY DAY 90 tablet 3  . furosemide (LASIX) 40 MG tablet Take 1 tablet (40 mg total) by mouth daily. 30 tablet 0  . ipratropium-albuterol (DUONEB) 0.5-2.5 (3) MG/3ML SOLN Take 3 mLs by nebulization every 6 (six) hours as needed (wheezing; shortness of breath).    Marland Kitchen JANUVIA 100 MG tablet TAKE 1 TABLET (100 MG TOTAL) BY MOUTH DAILY. 90 tablet 1  . metFORMIN (GLUCOPHAGE) 1000 MG tablet TAKE 1 TABLET (1,000 MG TOTAL) BY MOUTH 2 (TWO) TIMES DAILY WITH A MEAL. 180 tablet 1  . metoprolol succinate (TOPROL-XL) 100 MG 24 hr tablet Take 100 mg by mouth daily.     . ONE TOUCH ULTRA TEST test strip USE TO  TEST BLOOD SUGAR ONCE DAILY 100 each 3  . pantoprazole (PROTONIX) 40 MG tablet Take 1 tablet (40 mg total) by mouth every morning. (Patient taking differently: Take 40 mg by mouth daily before breakfast. ) 90 tablet 1  . sacubitril-valsartan (ENTRESTO) 24-26 MG Take 1 tablet by mouth 2 (two) times daily. 60 tablet 3  . spironolactone (ALDACTONE) 25 MG tablet Take 0.5 tablets (12.5 mg total) by mouth daily. 30 tablet 3  . sucralfate (CARAFATE) 1 g tablet Take 1 g by mouth 4 (four) times daily -  with meals and at bedtime.      No current facility-administered medications for this visit.     Allergies as of 06/09/2018  . (No Known Allergies)    ROS:  General: Negative for anorexia, weight loss, fever, chills, fatigue, weakness. ENT: Negative for hoarseness, difficulty swallowing , nasal congestion. CV: Negative for chest pain, angina, palpitations, dyspnea on exertion, peripheral edema.  Respiratory: Negative for dyspnea at rest, dyspnea on exertion, cough, sputum, wheezing.  GI: See history of present illness. GU:  Negative for dysuria, hematuria, urinary incontinence, urinary frequency, nocturnal urination.  Endo: Negative for unusual weight change.    Physical Examination:   There were no vitals taken for this visit.  General: appears comfortable .  Eyes: No icterus. Conjunctivae pink. Mouth: Oropharyngeal mucosa moist and pink , no lesions erythema or exudate. Lungs: Clear to auscultation bilaterally. Non-labored. Heart: Regular rate and rhythm, no murmurs rubs or gallops.  Abdomen: Bowel sounds are normal, nontender, nondistended, no hepatosplenomegaly or masses, no abdominal bruits or hernia , no rebound or guarding.   Extremities: No lower extremity edema. No clubbing or deformities. Neuro: Alert and oriented x 3.  Grossly intact. Skin: Warm and dry, no jaundice.   Psych: Alert and cooperative, normal mood and affect.   Imaging Studies: Dg Chest 2 View  Result Date:  05/27/2018 CLINICAL DATA:  Dyspnea, cough EXAM: CHEST - 2 VIEW COMPARISON:  05/25/2018 FINDINGS: Heart size and vascularity normal.  AICD unchanged in position. Mild left lower lobe airspace disease is new and could represent atelectasis or pneumonia. No effusion. Right lung clear. IMPRESSION: Question early pneumonia left lung base. Electronically Signed   By: Franchot Gallo M.D.   On: 05/27/2018 12:55   Dg Chest 2 View  Result Date: 05/25/2018 CLINICAL DATA:  Productive cough. EXAM: CHEST - 2 VIEW COMPARISON:  Radiographs of January 14, 2018. FINDINGS: The heart size and mediastinal contours are within normal limits. Both lungs are clear. No pneumothorax or pleural effusion is noted. Left-sided pacemaker is unchanged in position. The visualized skeletal structures are unremarkable. IMPRESSION: No active cardiopulmonary disease. Electronically Signed   By: Marijo Conception, M.D.   On: 05/25/2018  14:70    Assessment and Plan:   RODD HEFT is a 63 y.o. y/o male here to see me for change in bowel movements which have resolved. No recent colonoscopy Previously been seen for ascites  . Hard to determine etiology  but likely may have a combination of CHF with an element of possible cirrhosis based on imaging . No evidence of portal hypertension biochemical or radiologically. SAAG is > 1.1 which can indicate ascites from portal hypertension. Presently on diuretics started by Cardiology for CHF.Clinically heart failure has been well controlled and euvolemic fluid status.    Plan  1.EGD to evaluate for gastric intestinal metaplasia, colonoscopy to evaluate change in bowel habits.  2. RUQ USG to screen for HCC 3. Plavix holding instructions from cardiology   I have discussed alternative options, risks & benefits,  which include, but are not limited to, bleeding, infection, perforation,respiratory complication & drug reaction.  The patient agrees with this plan & written consent will be obtained.      Dr John Bellows  MD,MRCP [facility]) Follow up in 3 months

## 2018-06-10 ENCOUNTER — Telehealth: Payer: Self-pay

## 2018-06-10 NOTE — Telephone Encounter (Signed)
Called pt to inform him of ultrasound appointment.  Unable to contact LVM to return call

## 2018-06-10 NOTE — Telephone Encounter (Signed)
PT LEFT VM HE MISSED A CALL

## 2018-06-11 NOTE — Telephone Encounter (Signed)
Pt is calling he lost his instruction and his rx please call him he will stop by the office to pick up another rx

## 2018-06-11 NOTE — Telephone Encounter (Signed)
Pt stopped by the office the pick up prep instructions and I also gave pt his ultrasound appointment information.

## 2018-06-12 NOTE — Progress Notes (Signed)
Patient ID: John Frank, male    DOB: [birth year], 63 y.o.   MRN: [id number]  HPI  John Frank is a 63 y/o male with a history of CAD, DM, hyperlipidemia, HTN, GERD, COPD, obstructive sleep apnea, chronic heart failure and current tobacco use.   Echo report from 04/13/17 reviewed and showed an EF of 30% along with mild John/TR.  Admitted 05/25/2018 due to COPD exacerbation & flu. Initially needed oxygen but was weaned off of it. Given steroids, nebulizers, inhalers and antibiotics. Discharged after 3 days. Was in the ED 01/14/18 due to acute bronchitis where he was treated and released.   He presents today for a follow-up visit with a chief complaint of minimal fatigue upon moderate exertion. He describes this as being present for several years. He has associated cough along with this. He denies any difficulty sleeping, dizziness, abdominal distention, palpitations, pedal edema, chest pain, wheezing, shortness of breath or weight gain. Currently has [place] coming ~ weekly.   Past Medical History:  Diagnosis Date  . CAD (coronary artery disease)    s/p stents in 2017  . CHF (congestive heart failure) ([place])   . COPD (chronic obstructive pulmonary disease) ([place])   . Diabetes ([place])   . GERD (gastroesophageal reflux disease)   . Heart attack ([place])   . Hyperlipidemia   . Hypertension   . Presence of permanent cardiac pacemaker    and defibrillator  . Shortness of breath dyspnea   . Sleep apnea    Past Surgical History:  Procedure Laterality Date  . cardiac stents    . CARDIAC SURGERY     defibrillator and pacemaker  . CORONARY ANGIOPLASTY    . ESOPHAGOGASTRODUODENOSCOPY N/A 09/19/2014   Procedure: ESOPHAGOGASTRODUODENOSCOPY (EGD);  Surgeon: Lucilla Lame, MD;  Location: [facility] ENDOSCOPY;  Service: Endoscopy;  Laterality: N/A;  . ESOPHAGOGASTRODUODENOSCOPY (EGD) WITH PROPOFOL N/A 09/28/2017   Procedure: ESOPHAGOGASTRODUODENOSCOPY (EGD) WITH PROPOFOL;  Surgeon: Virgel Manifold,  MD;  Location: [facility] ENDOSCOPY;  Service: Endoscopy;  Laterality: N/A;  . ESOPHAGOGASTRODUODENOSCOPY (EGD) WITH PROPOFOL N/A 04/19/2018   Procedure: ESOPHAGOGASTRODUODENOSCOPY (EGD) WITH PROPOFOL with Gastric Mapping;  Surgeon: [name], MD;  Location: [facility] ENDOSCOPY;  Service: Gastroenterology;  Laterality: N/A;   Family History  Problem Relation Age of Onset  . Diabetes Mother   . Heart attack Father 52   Social History   Tobacco Use  . Smoking status: Current Every Day Smoker    Packs/day: 1.00    Years: 40.00    Pack years: 40.00    Types: Cigarettes  . Smokeless tobacco: Former Network engineer Use Topics  . Alcohol use: No   No Known Allergies  Prior to Admission medications   Medication Sig Start Date End Date Taking? Authorizing Provider  albuterol (PROVENTIL HFA;VENTOLIN HFA) 108 (90 Base) MCG/ACT inhaler Inhale 2 puffs into the lungs every 6 (six) hours as needed for wheezing or shortness of breath. 05/26/17  Yes Henreitta Leber, MD  aspirin EC 81 MG tablet Take 81 mg by mouth daily.   Yes [provider]  atorvastatin (LIPITOR) 40 MG tablet TAKE 1 TABLET (40 MG TOTAL) BY MOUTH DAILY. 03/08/18  Yes Cannady, Jolene T, NP  budesonide-formoterol (SYMBICORT) 160-4.5 MCG/ACT inhaler Inhale 2 puffs into the lungs 2 (two) times daily. 06/15/15  Yes Kathrine Haddock, NP  buPROPion (WELLBUTRIN) 75 MG tablet Take 75 mg by mouth daily. 03/26/16 06/14/18 Yes [provider]  clopidogrel (PLAVIX) 75 MG tablet TAKE 1 TABLET (75  MG TOTAL) BY MOUTH DAILY. 12/08/16  Yes Kathrine Haddock, NP  famotidine (PEPCID) 40 MG tablet TAKE 1 TABLET BY MOUTH EVERY DAY 09/09/17  Yes Kathrine Haddock, NP  furosemide (LASIX) 40 MG tablet Take 1 tablet (40 mg total) by mouth daily. Patient taking differently: Take 40 mg by mouth 2 (two) times daily.  08/11/17  Yes Wieting, Richard, MD  ipratropium-albuterol (DUONEB) 0.5-2.5 (3) MG/3ML SOLN Take 3 mLs by nebulization every 6 (six) hours as needed  (wheezing; shortness of breath).   Yes [provider]  JANUVIA 100 MG tablet TAKE 1 TABLET (100 MG TOTAL) BY MOUTH DAILY. 09/14/17  Yes Kathrine Haddock, NP  metFORMIN (GLUCOPHAGE) 1000 MG tablet TAKE 1 TABLET (1,000 MG TOTAL) BY MOUTH 2 (TWO) TIMES DAILY WITH A MEAL. 09/12/16  Yes Kathrine Haddock, NP  metoprolol succinate (TOPROL-XL) 100 MG 24 hr tablet Take 100 mg by mouth daily.  08/19/17  Yes [provider]  pantoprazole (PROTONIX) 40 MG tablet Take 1 tablet (40 mg total) by mouth every morning. Patient taking differently: Take 40 mg by mouth daily before breakfast.  06/29/17  Yes [name], MD  sacubitril-valsartan (ENTRESTO) 24-26 MG Take 1 tablet by mouth 2 (two) times daily. 04/27/18  Yes Darylene Price A, FNP  spironolactone (ALDACTONE) 25 MG tablet Take 0.5 tablets (12.5 mg total) by mouth daily. 12/29/17  Yes Darylene Price A, FNP  sucralfate (CARAFATE) 1 g tablet Take 1 g by mouth 4 (four) times daily -  with meals and at bedtime.  05/15/17 06/14/18 Yes [provider]  [place] TEST test strip USE TO TEST BLOOD SUGAR ONCE DAILY 10/07/16   Park Liter P, DO    Review of Systems  Constitutional: Positive for fatigue (with moderate exertion). Negative for appetite change and fever.  HENT: Negative for congestion, postnasal drip and sore throat.   Eyes: Negative.   Respiratory: Positive for cough. Negative for chest tightness, shortness of breath and wheezing.   Cardiovascular: Negative for chest pain, palpitations and leg swelling.  Gastrointestinal: Negative for abdominal distention and abdominal pain.  Endocrine: Negative.   Genitourinary: Negative.   Musculoskeletal: Negative for back pain and neck pain.  Skin: Negative.   Allergic/Immunologic: Negative.   Neurological: Negative for dizziness, light-headedness and headaches.  Hematological: Negative for adenopathy. Does not bruise/bleed easily.  Psychiatric/Behavioral: Negative for dysphoric mood and  sleep disturbance (wearing CPAP ). The patient is not nervous/anxious.    Vitals:   06/14/18 1336  BP: (!) 121/59  Pulse: 80  Resp: 18  SpO2: 95%  Weight: 152 lb 4 oz (69.1 kg)  Height: 5\' 4"  (1.626 m)   Wt Readings from Last 3 Encounters:  06/14/18 152 lb 4 oz (69.1 kg)  06/09/18 155 lb 6.4 oz (70.5 kg)  05/28/18 144 lb 10 oz (65.6 kg)   Lab Results  Component Value Date   CREATININE 0.97 05/27/2018   CREATININE 0.98 05/26/2018   CREATININE 1.15 05/25/2018    Physical Exam  Constitutional: He is oriented to person, place, and time. He appears well-developed and well-nourished.  HENT:  Head: Normocephalic and atraumatic.  Neck: Normal range of motion. Neck supple. No JVD present.  Cardiovascular: Normal rate and regular rhythm.  Pulmonary/Chest: Effort normal. He has no wheezes. He has rhonchi in the right lower field and the left lower field. He has no rales.  Abdominal: Soft. He exhibits no distension. There is no abdominal tenderness.  Musculoskeletal:        General:  No tenderness or edema.  Neurological: He is alert and oriented to person, place, and time.  Skin: Skin is warm and dry.  Psychiatric: He has a normal mood and affect. His behavior is normal. Thought content normal.  Nursing note and vitals reviewed.  Assessment & Plan:  1: Chronic heart failure with reduced ejection fraction- - NYHA class II - euvolemic today - weighing daily and he was reminded to call for an overnight weight gain of >2 pounds or a weekly weight gain of >5 pounds.  - weight down 5 pounds since he was last here 6 weeks ago - not adding salt; using Mrs. Dash; admits that he's not reading food labels; reminded him to keep his daily sodium intake to 2000mg  a day. Does eat quite a bit of processed food; rinses canned vegetables out - currently drinking a 1/2L soda, ~20 ounces of tea and 2-3 cups of coffee daily. Drinking 1 bottle of water daily - saw cardiology Clayborn Bigness) 02/16/18 - saw  pulmonologist Raul Del) 06/22/17 - BNP 08/09/17 was 756.0 - currently has pacemaker/defibrillator  - will increase his entresto to 49/51mg  BID. Instructed him to finish out his current bottle of 24/26mg  (has ~ 3 weeks worth left) and then begin the 49/51mg  dose - will check BMP at his next visit - will not check BMP today since it was done ~ 10 days ago  2: HTN- - BP looks good today - saw PCP Vidal Schwalbe) 06/02/2018 - BMP 05/27/2018 reviewed and showed sodium 133, potassium 5.4, creatinine 0.97 and GFR >60  3: Diabetes- - hasn't checked his glucose in a few days - A1c 05/25/2018 was 7.2%  4: Tobacco use- - currently smoking 1-2 cigarettes daily  - complete cessation discussed for 3 minutes with patient  Medication bottles were reviewed. Reminded him to bring his bottles to every visit.  Return in 2 months or sooner for any questions/problems before then.

## 2018-06-14 ENCOUNTER — Other Ambulatory Visit: Payer: Self-pay | Admitting: Gastroenterology

## 2018-06-14 ENCOUNTER — Ambulatory Visit: Payer: Medicare HMO | Attending: Family | Admitting: Family

## 2018-06-14 ENCOUNTER — Encounter: Payer: Self-pay | Admitting: Family

## 2018-06-14 VITALS — BP 121/59 | HR 80 | Resp 18 | Ht 64.0 in | Wt 152.2 lb

## 2018-06-14 DIAGNOSIS — Z8249 Family history of ischemic heart disease and other diseases of the circulatory system: Secondary | ICD-10-CM | POA: Insufficient documentation

## 2018-06-14 DIAGNOSIS — G4733 Obstructive sleep apnea (adult) (pediatric): Secondary | ICD-10-CM | POA: Diagnosis not present

## 2018-06-14 DIAGNOSIS — I110 Hypertensive heart disease with heart failure: Secondary | ICD-10-CM | POA: Insufficient documentation

## 2018-06-14 DIAGNOSIS — R188 Other ascites: Secondary | ICD-10-CM

## 2018-06-14 DIAGNOSIS — Z7982 Long term (current) use of aspirin: Secondary | ICD-10-CM | POA: Insufficient documentation

## 2018-06-14 DIAGNOSIS — Z955 Presence of coronary angioplasty implant and graft: Secondary | ICD-10-CM | POA: Diagnosis not present

## 2018-06-14 DIAGNOSIS — K7469 Other cirrhosis of liver: Secondary | ICD-10-CM

## 2018-06-14 DIAGNOSIS — F17210 Nicotine dependence, cigarettes, uncomplicated: Secondary | ICD-10-CM | POA: Diagnosis not present

## 2018-06-14 DIAGNOSIS — Z950 Presence of cardiac pacemaker: Secondary | ICD-10-CM | POA: Insufficient documentation

## 2018-06-14 DIAGNOSIS — E119 Type 2 diabetes mellitus without complications: Secondary | ICD-10-CM | POA: Diagnosis not present

## 2018-06-14 DIAGNOSIS — K219 Gastro-esophageal reflux disease without esophagitis: Secondary | ICD-10-CM | POA: Insufficient documentation

## 2018-06-14 DIAGNOSIS — E785 Hyperlipidemia, unspecified: Secondary | ICD-10-CM | POA: Insufficient documentation

## 2018-06-14 DIAGNOSIS — I2510 Atherosclerotic heart disease of native coronary artery without angina pectoris: Secondary | ICD-10-CM | POA: Insufficient documentation

## 2018-06-14 DIAGNOSIS — Z7984 Long term (current) use of oral hypoglycemic drugs: Secondary | ICD-10-CM | POA: Insufficient documentation

## 2018-06-14 DIAGNOSIS — I5022 Chronic systolic (congestive) heart failure: Secondary | ICD-10-CM | POA: Insufficient documentation

## 2018-06-14 DIAGNOSIS — Z79899 Other long term (current) drug therapy: Secondary | ICD-10-CM | POA: Diagnosis not present

## 2018-06-14 DIAGNOSIS — J441 Chronic obstructive pulmonary disease with (acute) exacerbation: Secondary | ICD-10-CM | POA: Insufficient documentation

## 2018-06-14 DIAGNOSIS — I252 Old myocardial infarction: Secondary | ICD-10-CM | POA: Diagnosis not present

## 2018-06-14 DIAGNOSIS — I10 Essential (primary) hypertension: Secondary | ICD-10-CM

## 2018-06-14 DIAGNOSIS — Z720 Tobacco use: Secondary | ICD-10-CM

## 2018-06-14 MED ORDER — SACUBITRIL-VALSARTAN 49-51 MG PO TABS: 1.0000 | ORAL_TABLET | Freq: Two times a day (BID) | ORAL | 5 refills | Status: DC

## 2018-06-14 NOTE — Patient Instructions (Addendum)
Continue weighing daily and call for an overnight weight gain of > 2 pounds or a weekly weight gain of >5 pounds.  Finish current [employer]. When you finish your current bottle of 24/26mg , you will then start the new bottle of 49/51mg  and continue taking it as 1 tablet twice daily.

## 2018-06-15 ENCOUNTER — Encounter: Payer: Self-pay | Admitting: Gastroenterology

## 2018-06-15 ENCOUNTER — Encounter: Payer: Self-pay | Admitting: Family

## 2018-06-15 ENCOUNTER — Ambulatory Visit
Admission: RE | Admit: 2018-06-15 | Discharge: 2018-06-15 | Disposition: A | Discharge status: Home / Self Care | Payer: Medicare HMO | Source: Ambulatory Visit | Attending: Gastroenterology | Admitting: Gastroenterology

## 2018-06-15 DIAGNOSIS — R188 Other ascites: Secondary | ICD-10-CM | POA: Diagnosis present

## 2018-06-15 DIAGNOSIS — K7469 Other cirrhosis of liver: Secondary | ICD-10-CM | POA: Insufficient documentation

## 2018-07-08 ENCOUNTER — Other Ambulatory Visit: Payer: Self-pay | Admitting: Nurse Practitioner

## 2018-07-08 NOTE — Telephone Encounter (Signed)
Patient is not under the care of Marnee Guarneri, NP.

## 2018-07-21 ENCOUNTER — Telehealth: Payer: Self-pay

## 2018-07-21 ENCOUNTER — Other Ambulatory Visit: Payer: Self-pay

## 2018-07-21 DIAGNOSIS — K31A0 Gastric intestinal metaplasia, unspecified: Secondary | ICD-10-CM

## 2018-07-21 DIAGNOSIS — K2970 Gastritis, unspecified, without bleeding: Secondary | ICD-10-CM

## 2018-07-21 DIAGNOSIS — K3189 Other diseases of stomach and duodenum: Principal | ICD-10-CM

## 2018-07-21 DIAGNOSIS — R194 Change in bowel habit: Secondary | ICD-10-CM

## 2018-07-21 NOTE — Telephone Encounter (Signed)
Spoke with pt and informed him that we have received the cardiac clearance and blood thinner holding instructions from Dr. Clayborn Bigness. Pt is cleared to proceed with the endoscopy procedure and has been instructed to stop Plavix 5 days prior to procedure and resume 3 days after the procedure. Pt has been scheduled.

## 2018-07-30 ENCOUNTER — Telehealth: Payer: Self-pay

## 2018-07-30 NOTE — Telephone Encounter (Signed)
Contacted pt to inform that we will be canceling their upcoming endoscopy procedure due to precautions for the COVID-19 outbreak. Pt will be rescheduled in the near future. Pt agrees and is aware that he'll need to continue taking the Plavix.

## 2018-08-03 ENCOUNTER — Ambulatory Visit: Admission: RE | Admit: 2018-08-03 | Payer: Medicare HMO | Source: Home / Self Care | Admitting: Gastroenterology

## 2018-08-03 ENCOUNTER — Encounter: Admission: RE | Payer: Self-pay | Source: Home / Self Care

## 2018-08-03 SURGERY — COLONOSCOPY WITH PROPOFOL
Anesthesia: General

## 2018-08-24 ENCOUNTER — Ambulatory Visit: Payer: Medicare HMO | Admitting: Family

## 2018-09-01 ENCOUNTER — Telehealth: Payer: Self-pay | Admitting: Gastroenterology

## 2018-09-01 ENCOUNTER — Ambulatory Visit (INDEPENDENT_AMBULATORY_CARE_PROVIDER_SITE_OTHER): Payer: Medicare HMO | Admitting: Gastroenterology

## 2018-09-01 DIAGNOSIS — R188 Other ascites: Secondary | ICD-10-CM

## 2018-09-01 DIAGNOSIS — K2970 Gastritis, unspecified, without bleeding: Secondary | ICD-10-CM

## 2018-09-01 DIAGNOSIS — K3189 Other diseases of stomach and duodenum: Secondary | ICD-10-CM

## 2018-09-01 DIAGNOSIS — K7460 Unspecified cirrhosis of liver: Secondary | ICD-10-CM

## 2018-09-01 DIAGNOSIS — K31A0 Gastric intestinal metaplasia, unspecified: Secondary | ICD-10-CM

## 2018-09-01 NOTE — Telephone Encounter (Signed)
Left vm to schedule 3 month f/u

## 2018-09-01 NOTE — Progress Notes (Signed)
John Frank , MD [address]  [place]  [place], [place] [address]  Main: [phone]  Fax: [phone]   Primary Care Physician: Idelle Crouch, MD  Virtual Visit via Video Note  I connected with patient on 09/01/18 at 11:00 AM EDT by video and verified that I am speaking with the correct person using two identifiers.   I discussed the limitations, risks, security and privacy concerns of performing an evaluation and management service by video  and the availability of in person appointments. I also discussed with the patient that there may be a patient responsible charge related to this service. The patient expressed understanding and agreed to proceed.  Location of Patient: Home Location of Provider: Home Persons involved: Patient and provider only   History of Present Illness: No chief complaint on file.   HPI: John Frank is a 63 y.o. male  Summary of history : He has been seen previously for constipation.  Also been seen for ascites likely felt probably a combination of heart failure/cirhosis.  He was admitted in 08/2017 for CHF , EF 30% , right pleural effusion and ascites. Treated with diuresis , was noted to have ascites and a tap after giving him antibiotics showed high white cell count. GI was not consulted. CT abdomen showed ascites, possible early cirrhosis, right pleural effusion. Ascites fluid culture was negative . Ascitic fluid wcc 664 with 16% neutrophils. SAAG 1.6 which indicates possible portal hypertension.   08/03/17- Iron studies indicate possible deficiency. Normal B12, folate.H pylori stool antigen negative.  EGD in 2016 - normal. Ct abdomen in 01/2017 sowed right inguinal hernia. Labs 05/2017 : Hb 10.9 and MCV 24QASTM metabolic panel-normal. HIV negative . H/o GERD,COPD    04/19/2018 : Large amount of food in the stomach -Prior EGD showed gastric intestinal metaplasia  repeat requested but did not reschedule. No esophageal varices  seen.  Interval history 06/09/2018-09/01/2018  06/15/2018: RUQ USG no HCC EGD+colonoscopy cancelled in 07/2018 due to [place] well, he says he needs his meds for his nebulizers.   Denies any confusion, normal bowel movements .   Says he is not accumulating any fluid. Taking all his medications.      Current Outpatient Medications  Medication Sig Dispense Refill  . albuterol (PROVENTIL HFA;VENTOLIN HFA) 108 (90 Base) MCG/ACT inhaler Inhale 2 puffs into the lungs every 6 (six) hours as needed for wheezing or shortness of breath. 1 Inhaler 2  . aspirin EC 81 MG tablet Take 81 mg by mouth daily.    Marland Kitchen atorvastatin (LIPITOR) 40 MG tablet TAKE 1 TABLET (40 MG TOTAL) BY MOUTH DAILY. 90 tablet 1  . budesonide-formoterol (SYMBICORT) 160-4.5 MCG/ACT inhaler Inhale 2 puffs into the lungs 2 (two) times daily. 1 Inhaler 3  . clopidogrel (PLAVIX) 75 MG tablet TAKE 1 TABLET (75 MG TOTAL) BY MOUTH DAILY. 90 tablet 3  . famotidine (PEPCID) 40 MG tablet TAKE 1 TABLET BY MOUTH EVERY DAY 90 tablet 3  . furosemide (LASIX) 40 MG tablet Take 1 tablet (40 mg total) by mouth daily. (Patient taking differently: Take 40 mg by mouth 2 (two) times daily. ) 30 tablet 0  . ipratropium-albuterol (DUONEB) 0.5-2.5 (3) MG/3ML SOLN Take 3 mLs by nebulization every 6 (six) hours as needed (wheezing; shortness of breath).    Marland Kitchen JANUVIA 100 MG tablet TAKE 1 TABLET (100 MG TOTAL) BY MOUTH DAILY. 90 tablet 1  . levocetirizine (XYZAL) 5 MG tablet Take by mouth.    Marland Kitchen  metFORMIN (GLUCOPHAGE) 1000 MG tablet TAKE 1 TABLET (1,000 MG TOTAL) BY MOUTH 2 (TWO) TIMES DAILY WITH A MEAL. 180 tablet 1  . metoprolol succinate (TOPROL-XL) 100 MG 24 hr tablet Take 100 mg by mouth daily.     . ONE TOUCH ULTRA TEST test strip USE TO TEST BLOOD SUGAR ONCE DAILY 100 each 3  . pantoprazole (PROTONIX) 40 MG tablet Take 1 tablet (40 mg total) by mouth every morning. (Patient taking differently: Take 40 mg by mouth daily before breakfast. ) 90 tablet  1  . sacubitril-valsartan (ENTRESTO) 49-51 MG Take 1 tablet by mouth 2 (two) times daily. 60 tablet 5  . spironolactone (ALDACTONE) 25 MG tablet Take 0.5 tablets (12.5 mg total) by mouth daily. 30 tablet 3  . buPROPion (WELLBUTRIN) 75 MG tablet Take 75 mg by mouth daily.    . sucralfate (CARAFATE) 1 g tablet Take 1 g by mouth 4 (four) times daily -  with meals and at bedtime.      No current facility-administered medications for this visit.     Allergies as of 09/01/2018  . (No Known Allergies)    Review of Systems:    All systems reviewed and negative except where noted in HPI.  General Appearance:    Alert, cooperative, no distress, appears stated age  Head:    Normocephalic, without obvious abnormality, atraumatic  Eyes:    PERRL, conjunctiva/corneas clear,  Ears:    Grossly normal hearing    Neurologic:  Grossly normal    Observations/Objective:  Labs: CMP     Component Value Date/Time   NA 133 (L) 05/27/2018 1115   NA 141 04/02/2016 0847   NA 139 05/09/2014 1516   K 5.4 (H) 05/27/2018 1115   K 3.6 05/09/2014 1516   CL 100 05/27/2018 1115   CL 103 05/09/2014 1516   CO2 26 05/27/2018 1115   CO2 28 05/09/2014 1516   GLUCOSE 227 (H) 05/27/2018 1115   GLUCOSE 138 (H) 05/09/2014 1516   BUN 33 (H) 05/27/2018 1115   BUN 9 04/02/2016 0847   BUN 6 (L) 05/09/2014 1516   CREATININE 0.97 05/27/2018 1115   CREATININE 1.11 05/09/2014 1516   CALCIUM 8.6 (L) 05/27/2018 1115   CALCIUM 9.5 05/09/2014 1516   PROT 7.8 05/25/2018 1415   PROT 6.9 04/02/2016 0847   PROT 7.6 10/13/2013 0251   ALBUMIN 4.4 05/25/2018 1415   ALBUMIN 4.4 04/02/2016 0847   ALBUMIN 3.4 10/13/2013 0251   AST 35 05/25/2018 1415   AST 29 10/13/2013 0251   ALT 18 05/25/2018 1415   ALT 23 10/13/2013 0251   ALKPHOS 96 05/25/2018 1415   ALKPHOS 107 10/13/2013 0251   BILITOT 1.0 05/25/2018 1415   BILITOT 0.3 04/02/2016 0847   BILITOT 0.6 10/13/2013 0251   GFRNONAA >60 05/27/2018 1115   GFRNONAA >60  05/09/2014 1516   GFRNONAA >60 10/13/2013 0251   GFRAA >60 05/27/2018 1115   GFRAA >60 05/09/2014 1516   GFRAA >60 10/13/2013 0251   Lab Results  Component Value Date   WBC 5.8 05/26/2018   HGB 12.2 (L) 05/26/2018   HCT 37.3 (L) 05/26/2018   MCV 95.2 05/26/2018   PLT 138 (L) 05/26/2018    Imaging Studies: No results found.  Assessment and Plan:   John Frank is a 63 y.o. y/o male here to follow up for ascites  . Hard to determine etiology  but likely may have a combination of CHF with an element of possible cirrhosis  based on imaging . No evidence of portal hypertension biochemical or radiologically. SAAG is >1.1 which can indicate ascites from portal hypertension. Presently on diuretics started by Cardiology for CHF.    Plan  1.EGD to evaluate for gastric intestinal metaplasia, colonoscopy to evaluate change in bowel habits after COVID 2. RUQ USG to screen for [facility] in 12/2018 3. Plavix holding instructions from cardiology  4. Labs to calculate MELD score   F/u in 3 months    I discussed the assessment and treatment plan with the patient. The patient was provided an opportunity to ask questions and all were answered. The patient agreed with the plan and demonstrated an understanding of the instructions.   The patient was advised to call back or seek an in-person evaluation if the symptoms worsen or if the condition fails to improve as anticipated.    Dr John Bellows MD,MRCP [facility]) Gastroenterology/Hepatology Pager: [phone]   Speech recognition software was used to dictate this note.

## 2018-09-06 ENCOUNTER — Other Ambulatory Visit: Payer: Self-pay | Admitting: Unknown Physician Specialty

## 2018-09-09 ENCOUNTER — Ambulatory Visit: Payer: Medicare HMO | Admitting: Gastroenterology

## 2018-10-04 ENCOUNTER — Other Ambulatory Visit: Payer: Self-pay | Admitting: Family

## 2018-10-04 DIAGNOSIS — I5022 Chronic systolic (congestive) heart failure: Secondary | ICD-10-CM

## 2018-10-13 ENCOUNTER — Telehealth: Payer: Self-pay

## 2018-10-13 NOTE — Telephone Encounter (Signed)
Called pt to schedule EGD/Colonoscopy, pt has been cleared by cardiologist, Dr. Clayborn Bigness, who also gave blood thinner holding instructions for the Plavix and Aspirin: stop 5 days prior to procedure and resume 1 day after.  Unable to contact, LVM to return call

## 2018-10-13 NOTE — Telephone Encounter (Signed)
Returned pt call to schedule procedure.  Unable to contact, LVM to return call

## 2018-10-13 NOTE — Telephone Encounter (Signed)
PT LEFT VM HE IS RETURNING YOUR CALL

## 2018-10-19 ENCOUNTER — Encounter: Payer: Self-pay | Admitting: Family

## 2018-10-19 ENCOUNTER — Ambulatory Visit: Payer: Medicare Other | Attending: Family | Admitting: Family

## 2018-10-19 ENCOUNTER — Other Ambulatory Visit: Payer: Self-pay

## 2018-10-19 VITALS — BP 113/77 | HR 85 | Temp 98.2°F | Resp 18 | Ht 64.0 in | Wt 159.0 lb

## 2018-10-19 DIAGNOSIS — Z720 Tobacco use: Secondary | ICD-10-CM

## 2018-10-19 DIAGNOSIS — I5022 Chronic systolic (congestive) heart failure: Secondary | ICD-10-CM | POA: Diagnosis present

## 2018-10-19 DIAGNOSIS — Z79899 Other long term (current) drug therapy: Secondary | ICD-10-CM | POA: Diagnosis not present

## 2018-10-19 DIAGNOSIS — Z95810 Presence of automatic (implantable) cardiac defibrillator: Secondary | ICD-10-CM | POA: Diagnosis not present

## 2018-10-19 DIAGNOSIS — Z833 Family history of diabetes mellitus: Secondary | ICD-10-CM | POA: Diagnosis not present

## 2018-10-19 DIAGNOSIS — I10 Essential (primary) hypertension: Secondary | ICD-10-CM

## 2018-10-19 DIAGNOSIS — I252 Old myocardial infarction: Secondary | ICD-10-CM | POA: Diagnosis not present

## 2018-10-19 DIAGNOSIS — Z955 Presence of coronary angioplasty implant and graft: Secondary | ICD-10-CM | POA: Insufficient documentation

## 2018-10-19 DIAGNOSIS — F17210 Nicotine dependence, cigarettes, uncomplicated: Secondary | ICD-10-CM | POA: Insufficient documentation

## 2018-10-19 DIAGNOSIS — Z8249 Family history of ischemic heart disease and other diseases of the circulatory system: Secondary | ICD-10-CM | POA: Insufficient documentation

## 2018-10-19 DIAGNOSIS — Z7902 Long term (current) use of antithrombotics/antiplatelets: Secondary | ICD-10-CM | POA: Insufficient documentation

## 2018-10-19 DIAGNOSIS — G4733 Obstructive sleep apnea (adult) (pediatric): Secondary | ICD-10-CM | POA: Insufficient documentation

## 2018-10-19 DIAGNOSIS — I2510 Atherosclerotic heart disease of native coronary artery without angina pectoris: Secondary | ICD-10-CM | POA: Insufficient documentation

## 2018-10-19 DIAGNOSIS — Z7982 Long term (current) use of aspirin: Secondary | ICD-10-CM | POA: Insufficient documentation

## 2018-10-19 DIAGNOSIS — E785 Hyperlipidemia, unspecified: Secondary | ICD-10-CM | POA: Insufficient documentation

## 2018-10-19 DIAGNOSIS — K219 Gastro-esophageal reflux disease without esophagitis: Secondary | ICD-10-CM | POA: Diagnosis not present

## 2018-10-19 DIAGNOSIS — E119 Type 2 diabetes mellitus without complications: Secondary | ICD-10-CM | POA: Diagnosis not present

## 2018-10-19 DIAGNOSIS — Z7984 Long term (current) use of oral hypoglycemic drugs: Secondary | ICD-10-CM | POA: Insufficient documentation

## 2018-10-19 DIAGNOSIS — I110 Hypertensive heart disease with heart failure: Secondary | ICD-10-CM | POA: Diagnosis not present

## 2018-10-19 LAB — BASIC METABOLIC PANEL
Anion gap: 9 (ref 5–15)
BUN: 8 mg/dL (ref 8–23)
CO2: 27 mmol/L (ref 22–32)
Calcium: 9.8 mg/dL (ref 8.9–10.3)
Chloride: 102 mmol/L (ref 98–111)
Creatinine, Ser: 0.9 mg/dL (ref 0.61–1.24)
GFR calc Af Amer: >60 mL/min (ref >60)
GFR calc non Af Amer: >60 mL/min (ref >60)
Glucose, Bld: 272 mg/dL — ABNORMAL HIGH (ref 70–99)
Potassium: 4.2 mmol/L (ref 3.5–5.1)
Sodium: 138 mmol/L (ref 135–145)

## 2018-10-19 NOTE — Patient Instructions (Signed)
Continue weighing daily and call for an overnight weight gain of > 2 pounds or a weekly weight gain of >5 pounds. 

## 2018-10-19 NOTE — Progress Notes (Signed)
Patient ID: John Frank, male    DOB: 08/18/1955, 63 y.o.   MRN: [id number]  HPI  John Frank is a 63 y/o male with a history of CAD, DM, hyperlipidemia, HTN, GERD, COPD, obstructive sleep apnea, chronic heart failure and current tobacco use.   Echo report from 04/13/17 reviewed and showed an EF of 30% along with mild John/TR.  Admitted 05/25/2018 due to COPD exacerbation & flu. Initially needed oxygen but was weaned off of it. Given steroids, nebulizers, inhalers and antibiotics. Discharged after 3 days.   He presents today for a follow-up visit with a chief complaint of minimal shortness of breath upon moderate exertion. He says that this has been present for several years. He has associated cough and gradual weight gain along with this. He denies any difficulty sleeping, dizziness, abdominal distention, palpitations, pedal edema, chest pain or fatigue.   Past Medical History:  Diagnosis Date  . CAD (coronary artery disease)    s/p stents in 2017  . CHF (congestive heart failure) ([place])   . COPD (chronic obstructive pulmonary disease) ([place])   . Diabetes ([place])   . GERD (gastroesophageal reflux disease)   . Heart attack ([place])   . Hyperlipidemia   . Hypertension   . Presence of permanent cardiac pacemaker    and defibrillator  . Shortness of breath dyspnea   . Sleep apnea    Past Surgical History:  Procedure Laterality Date  . cardiac stents    . CARDIAC SURGERY     defibrillator and pacemaker  . CORONARY ANGIOPLASTY    . ESOPHAGOGASTRODUODENOSCOPY N/A 09/19/2014   Procedure: ESOPHAGOGASTRODUODENOSCOPY (EGD);  Surgeon: Lucilla Lame, MD;  Location[facility] ENDOSCOPY;  Service: Endoscopy;  Laterality: N/A;  . ESOPHAGOGASTRODUODENOSCOPY (EGD) WITH PROPOFOL N/A 09/28/2017   Procedure: ESOPHAGOGASTRODUODENOSCOPY (EGD) WITH PROPOFOL;  Surgeon: Virgel Manifold, MD;  Location: [facility] ENDOSCOPY;  Service: Endoscopy;  Laterality: N/A;  . ESOPHAGOGASTRODUODENOSCOPY (EGD) WITH PROPOFOL N/A  04/19/2018   Procedure: ESOPHAGOGASTRODUODENOSCOPY (EGD) WITH PROPOFOL with Gastric Mapping;  Surgeon: [name], MD;  Location: [facility] ENDOSCOPY;  Service: Gastroenterology;  Laterality: N/A;   Family History  Problem Relation Age of Onset  . Diabetes Mother   . Heart attack Father 61   Social History   Tobacco Use  . Smoking status: Current Every Day Smoker    Packs/day: 1.00    Years: 40.00    Pack years: 40.00    Types: Cigarettes  . Smokeless tobacco: Former Network engineer Use Topics  . Alcohol use: No   No Known Allergies  Prior to Admission medications   Medication Sig Start Date End Date Taking? Authorizing Provider  aspirin EC 81 MG tablet Take 81 mg by mouth daily.   Yes [provider]  atorvastatin (LIPITOR) 40 MG tablet TAKE 1 TABLET (40 MG TOTAL) BY MOUTH DAILY. 03/08/18  Yes Cannady, Jolene T, NP  buPROPion (WELLBUTRIN) 75 MG tablet Take 75 mg by mouth daily. 03/26/16 10/19/18 Yes [provider]  clopidogrel (PLAVIX) 75 MG tablet TAKE 1 TABLET (75 MG TOTAL) BY MOUTH DAILY. 12/08/16  Yes Kathrine Haddock, NP  famotidine (PEPCID) 40 MG tablet TAKE 1 TABLET BY MOUTH EVERY DAY 09/09/17  Yes Kathrine Haddock, NP  furosemide (LASIX) 40 MG tablet Take 1 tablet (40 mg total) by mouth daily. Patient taking differently: Take 40 mg by mouth 2 (two) times daily.  08/11/17  Yes Wieting, Richard, MD  ipratropium-albuterol (DUONEB) 0.5-2.5 (3) MG/3ML SOLN Take 3 mLs by nebulization every 6 (  six) hours as needed (wheezing; shortness of breath).   Yes [provider]  JANUVIA 100 MG tablet TAKE 1 TABLET (100 MG TOTAL) BY MOUTH DAILY. 09/14/17  Yes Kathrine Haddock, NP  metFORMIN (GLUCOPHAGE) 1000 MG tablet TAKE 1 TABLET (1,000 MG TOTAL) BY MOUTH 2 (TWO) TIMES DAILY WITH A MEAL. 09/12/16  Yes Kathrine Haddock, NP  metoprolol succinate (TOPROL-XL) 100 MG 24 hr tablet Take 100 mg by mouth daily.  08/19/17  Yes [provider]  ONE TOUCH ULTRA TEST test strip USE TO TEST  BLOOD SUGAR ONCE DAILY 10/07/16  Yes Johnson, Megan P, DO  pantoprazole (PROTONIX) 40 MG tablet Take 1 tablet (40 mg total) by mouth every morning. Patient taking differently: Take 40 mg by mouth daily before breakfast.  06/29/17  Yes [name], MD  sacubitril-valsartan (ENTRESTO) 49-51 MG Take 1 tablet by mouth 2 (two) times daily. 06/14/18  Yes Darylene Price A, FNP  spironolactone (ALDACTONE) 25 MG tablet TAKE 1/2 TABLET BY MOUTH EVERY DAY 10/04/18  Yes Darylene Price A, FNP  sucralfate (CARAFATE) 1 g tablet Take 1 g by mouth 4 (four) times daily -  with meals and at bedtime.  05/15/17 10/19/18 Yes [provider]  albuterol (PROVENTIL HFA;VENTOLIN HFA) 108 (90 Base) MCG/ACT inhaler Inhale 2 puffs into the lungs every 6 (six) hours as needed for wheezing or shortness of breath. Patient not taking: Reported on 10/19/2018 05/26/17   Henreitta Leber, MD  budesonide-formoterol [facility]) 160-4.5 MCG/ACT inhaler Inhale 2 puffs into the lungs 2 (two) times daily. Patient not taking: Reported on 10/19/2018 06/15/15   Kathrine Haddock, NP    Review of Systems  Constitutional: Negative for appetite change and fever.  HENT: Negative for congestion, postnasal drip and sore throat.   Eyes: Negative.   Respiratory: Positive for cough and shortness of breath (with moderate exertion). Negative for chest tightness and wheezing.   Cardiovascular: Negative for chest pain, palpitations and leg swelling.  Gastrointestinal: Negative for abdominal distention.  Endocrine: Negative.   Genitourinary: Negative.   Musculoskeletal: Negative for back pain and neck pain.  Skin: Negative.   Allergic/Immunologic: Negative.   Neurological: Negative for dizziness, light-headedness and headaches.  Hematological: Negative for adenopathy. Does not bruise/bleed easily.  Psychiatric/Behavioral: Negative for dysphoric mood and sleep disturbance (wearing CPAP ). The patient is not nervous/anxious.    Vitals:   10/19/18 0925  BP:  113/77  Pulse: 85  Resp: 18  Temp: 98.2 F (36.8 C)  SpO2: 94%  Weight: 159 lb (72.1 kg)  Height: 5\' 4"  (1.626 m)   Wt Readings from Last 3 Encounters:  10/19/18 159 lb (72.1 kg)  06/14/18 152 lb 4 oz (69.1 kg)  06/09/18 155 lb 6.4 oz (70.5 kg)   Lab Results  Component Value Date   CREATININE 0.97 05/27/2018   CREATININE 0.98 05/26/2018   CREATININE 1.15 05/25/2018    Physical Exam  Constitutional: He is oriented to person, place, and time. He appears well-developed and well-nourished.  HENT:  Head: Normocephalic and atraumatic.  Neck: Normal range of motion. Neck supple. No JVD present.  Cardiovascular: Normal rate and regular rhythm.  Pulmonary/Chest: Effort normal. He has no wheezes. He has no rhonchi. He has no rales.  Abdominal: Soft. He exhibits no distension. There is no abdominal tenderness.  Musculoskeletal:        General: No tenderness or edema.  Neurological: He is alert and oriented to person, place, and time.  Skin: Skin is warm and dry.  Psychiatric: He has a normal mood and affect. His behavior is normal. Thought content normal.  Nursing note and vitals reviewed.  Assessment & Plan:  1: Chronic heart failure with reduced ejection fraction- - NYHA class II - euvolemic today - weighing daily and he was reminded to call for an overnight weight gain of >2 pounds or a weekly weight gain of >5 pounds.  - weight up 7 pounds from last visit here 4 months ago - not adding salt; using Mrs. Dash; admits that he's not reading food labels; reminded him to keep his daily sodium intake to 2000mg  a day. Does eat quite a bit of processed food; rinses canned vegetables out - saw cardiology Clayborn Bigness) 08/25/2018 - saw pulmonologist Raul Del) 06/23/2018; he needs to call him to get a refill on his symbicort - BNP 08/09/17 was 756.0 - currently has pacemaker/defibrillator  - will check BMP today since his entresto was increased at his last visit - consider titrating up  metoprolol / entresto at future visits if BP/ renal function looks good  2: HTN- - BP looks good today - saw PCP ([place]) 06/22/2018 - BMP 06/22/2018 reviewed and showed sodium 142, potassium 4.4, creatinine 0.9 and GFR 85  3: Diabetes- - hasn't checked his glucose in a few days - A1c 05/25/2018 was 7.2%  4: Tobacco use- - currently smoking 1/2 pack cigarettes daily  - complete cessation discussed for 3 minutes with patient  Medication bottles were reviewed. Reminded him to bring his bottles to every visit.  Return in 2 months or sooner for any questions/problems before then.

## 2018-10-20 ENCOUNTER — Other Ambulatory Visit: Payer: Self-pay

## 2018-10-20 DIAGNOSIS — R188 Other ascites: Secondary | ICD-10-CM

## 2018-10-20 DIAGNOSIS — K2970 Gastritis, unspecified, without bleeding: Secondary | ICD-10-CM

## 2018-10-20 DIAGNOSIS — K7460 Unspecified cirrhosis of liver: Secondary | ICD-10-CM

## 2018-10-20 NOTE — Telephone Encounter (Signed)
Spoke with pt and was able to schedule procedure. Pt is aware of blood thinner holding instructions. We are currently waiting for pulmonary clearance from Dr. Raul Del at [facility].

## 2018-11-01 ENCOUNTER — Other Ambulatory Visit: Payer: Self-pay | Admitting: Unknown Physician Specialty

## 2018-11-12 ENCOUNTER — Other Ambulatory Visit: Payer: Self-pay

## 2018-11-12 ENCOUNTER — Other Ambulatory Visit
Admission: RE | Admit: 2018-11-12 | Discharge: 2018-11-12 | Disposition: A | Discharge status: Home / Self Care | Payer: Medicare Other | Source: Ambulatory Visit | Attending: Gastroenterology | Admitting: Gastroenterology

## 2018-11-12 DIAGNOSIS — Z1159 Encounter for screening for other viral diseases: Secondary | ICD-10-CM | POA: Insufficient documentation

## 2018-11-12 DIAGNOSIS — Z01812 Encounter for preprocedural laboratory examination: Secondary | ICD-10-CM | POA: Insufficient documentation

## 2018-11-12 LAB — SARS CORONAVIRUS 2 (TAT 6-24 HRS): SARS Coronavirus 2: NEGATIVE

## 2018-11-15 ENCOUNTER — Ambulatory Visit: Payer: Medicare Other | Admitting: Anesthesiology

## 2018-11-15 ENCOUNTER — Encounter: Payer: Self-pay | Admitting: Anesthesiology

## 2018-11-16 ENCOUNTER — Encounter: Payer: Self-pay | Admitting: Emergency Medicine

## 2018-11-16 ENCOUNTER — Ambulatory Visit
Admission: RE | Admit: 2018-11-16 | Discharge: 2018-11-16 | Disposition: A | Discharge status: Home / Self Care | Payer: Medicare Other | Attending: Gastroenterology | Admitting: Gastroenterology

## 2018-11-16 ENCOUNTER — Encounter
Admission: RE | Disposition: A | Discharge status: Home / Self Care | Payer: Self-pay | Source: Home / Self Care | Attending: Gastroenterology

## 2018-11-16 ENCOUNTER — Other Ambulatory Visit: Payer: Self-pay

## 2018-11-16 DIAGNOSIS — K3189 Other diseases of stomach and duodenum: Secondary | ICD-10-CM | POA: Insufficient documentation

## 2018-11-16 DIAGNOSIS — I252 Old myocardial infarction: Secondary | ICD-10-CM | POA: Insufficient documentation

## 2018-11-16 DIAGNOSIS — G4730 Sleep apnea, unspecified: Secondary | ICD-10-CM | POA: Diagnosis not present

## 2018-11-16 DIAGNOSIS — I110 Hypertensive heart disease with heart failure: Secondary | ICD-10-CM | POA: Insufficient documentation

## 2018-11-16 DIAGNOSIS — I2510 Atherosclerotic heart disease of native coronary artery without angina pectoris: Secondary | ICD-10-CM | POA: Diagnosis not present

## 2018-11-16 DIAGNOSIS — K2970 Gastritis, unspecified, without bleeding: Secondary | ICD-10-CM

## 2018-11-16 DIAGNOSIS — F17200 Nicotine dependence, unspecified, uncomplicated: Secondary | ICD-10-CM | POA: Insufficient documentation

## 2018-11-16 DIAGNOSIS — J449 Chronic obstructive pulmonary disease, unspecified: Secondary | ICD-10-CM | POA: Insufficient documentation

## 2018-11-16 DIAGNOSIS — R188 Other ascites: Secondary | ICD-10-CM

## 2018-11-16 DIAGNOSIS — Z539 Procedure and treatment not carried out, unspecified reason: Secondary | ICD-10-CM | POA: Insufficient documentation

## 2018-11-16 DIAGNOSIS — E119 Type 2 diabetes mellitus without complications: Secondary | ICD-10-CM | POA: Insufficient documentation

## 2018-11-16 DIAGNOSIS — K7460 Unspecified cirrhosis of liver: Secondary | ICD-10-CM | POA: Insufficient documentation

## 2018-11-16 DIAGNOSIS — K31A0 Gastric intestinal metaplasia, unspecified: Secondary | ICD-10-CM

## 2018-11-16 DIAGNOSIS — I509 Heart failure, unspecified: Secondary | ICD-10-CM | POA: Insufficient documentation

## 2018-11-16 LAB — GLUCOSE, CAPILLARY: Glucose-Capillary: 227 mg/dL — ABNORMAL HIGH (ref 70–99)

## 2018-11-16 SURGERY — COLONOSCOPY WITH PROPOFOL
Anesthesia: General

## 2018-11-16 MED ORDER — LIDOCAINE HCL (PF) 2 % IJ SOLN
INTRAMUSCULAR | Status: AC
  Filled 2018-11-16: qty 10

## 2018-11-16 MED ORDER — PROPOFOL 500 MG/50ML IV EMUL
INTRAVENOUS | Status: AC
  Filled 2018-11-16: qty 50

## 2018-11-16 MED ORDER — SODIUM CHLORIDE 0.9 % IV SOLN
INTRAVENOUS | Status: DC
  Administered 2018-11-16: 08:00:00 1000 mL via INTRAVENOUS

## 2018-11-16 MED ORDER — MIDAZOLAM HCL 2 MG/2ML IJ SOLN
INTRAMUSCULAR | Status: AC
  Filled 2018-11-16: qty 2

## 2018-11-16 NOTE — Anesthesia Preprocedure Evaluation (Signed)
Anesthesia Evaluation  Patient identified by MRN, date of birth, ID band Patient awake    Reviewed: Allergy & Precautions, NPO status , Patient's Chart, lab work & pertinent test results, reviewed documented beta blocker date and time   Airway Mallampati: III  TM Distance: >3 FB     Dental  (+) Chipped   Pulmonary shortness of breath, sleep apnea , COPD, Current Smoker,           Cardiovascular hypertension, Pt. on medications and Pt. on home beta blockers + CAD, + Past MI, + Cardiac Stents and +CHF  + pacemaker + Cardiac Defibrillator      Neuro/Psych    GI/Hepatic GERD  ,(+) Cirrhosis       ,   Endo/Other  diabetes, Type 2  Renal/GU      Musculoskeletal   Abdominal   Peds  Hematology  (+) anemia ,   Anesthesia Other Findings Smokes.  Reproductive/Obstetrics                             Anesthesia Physical Anesthesia Plan  ASA: III  Anesthesia Plan: General   Post-op Pain Management:    Induction: Intravenous  PONV Risk Score and Plan:   Airway Management Planned:   Additional Equipment:   Intra-op Plan:   Post-operative Plan:   Informed Consent: I have reviewed the patients History and Physical, chart, labs and discussed the procedure including the risks, benefits and alternatives for the proposed anesthesia with the patient or authorized representative who has indicated his/her understanding and acceptance.       Plan Discussed with: CRNA  Anesthesia Plan Comments:         Anesthesia Quick Evaluation

## 2018-11-18 ENCOUNTER — Emergency Department: Payer: Medicare Other

## 2018-11-18 ENCOUNTER — Observation Stay
Admission: EM | Admit: 2018-11-18 | Discharge: 2018-11-19 | Disposition: A | Discharge status: Home / Self Care | Payer: Medicare Other | Attending: Internal Medicine | Admitting: Internal Medicine

## 2018-11-18 ENCOUNTER — Other Ambulatory Visit: Payer: Self-pay

## 2018-11-18 ENCOUNTER — Encounter: Payer: Self-pay | Admitting: Emergency Medicine

## 2018-11-18 DIAGNOSIS — I252 Old myocardial infarction: Secondary | ICD-10-CM | POA: Diagnosis not present

## 2018-11-18 DIAGNOSIS — I5022 Chronic systolic (congestive) heart failure: Secondary | ICD-10-CM | POA: Diagnosis not present

## 2018-11-18 DIAGNOSIS — E119 Type 2 diabetes mellitus without complications: Secondary | ICD-10-CM | POA: Insufficient documentation

## 2018-11-18 DIAGNOSIS — Z9181 History of falling: Secondary | ICD-10-CM | POA: Diagnosis not present

## 2018-11-18 DIAGNOSIS — R55 Syncope and collapse: Secondary | ICD-10-CM | POA: Insufficient documentation

## 2018-11-18 DIAGNOSIS — Z79899 Other long term (current) drug therapy: Secondary | ICD-10-CM | POA: Diagnosis not present

## 2018-11-18 DIAGNOSIS — I110 Hypertensive heart disease with heart failure: Secondary | ICD-10-CM | POA: Insufficient documentation

## 2018-11-18 DIAGNOSIS — E785 Hyperlipidemia, unspecified: Secondary | ICD-10-CM | POA: Diagnosis not present

## 2018-11-18 DIAGNOSIS — G4733 Obstructive sleep apnea (adult) (pediatric): Secondary | ICD-10-CM | POA: Insufficient documentation

## 2018-11-18 DIAGNOSIS — K219 Gastro-esophageal reflux disease without esophagitis: Secondary | ICD-10-CM | POA: Diagnosis present

## 2018-11-18 DIAGNOSIS — I2510 Atherosclerotic heart disease of native coronary artery without angina pectoris: Secondary | ICD-10-CM | POA: Diagnosis not present

## 2018-11-18 DIAGNOSIS — F17210 Nicotine dependence, cigarettes, uncomplicated: Secondary | ICD-10-CM | POA: Insufficient documentation

## 2018-11-18 DIAGNOSIS — Z950 Presence of cardiac pacemaker: Secondary | ICD-10-CM | POA: Insufficient documentation

## 2018-11-18 DIAGNOSIS — R188 Other ascites: Secondary | ICD-10-CM | POA: Diagnosis not present

## 2018-11-18 DIAGNOSIS — Z7951 Long term (current) use of inhaled steroids: Secondary | ICD-10-CM | POA: Diagnosis not present

## 2018-11-18 DIAGNOSIS — K7460 Unspecified cirrhosis of liver: Secondary | ICD-10-CM | POA: Insufficient documentation

## 2018-11-18 DIAGNOSIS — Z1159 Encounter for screening for other viral diseases: Secondary | ICD-10-CM | POA: Diagnosis not present

## 2018-11-18 DIAGNOSIS — G4730 Sleep apnea, unspecified: Secondary | ICD-10-CM | POA: Diagnosis present

## 2018-11-18 DIAGNOSIS — Z7984 Long term (current) use of oral hypoglycemic drugs: Secondary | ICD-10-CM | POA: Insufficient documentation

## 2018-11-18 DIAGNOSIS — Z7982 Long term (current) use of aspirin: Secondary | ICD-10-CM | POA: Diagnosis not present

## 2018-11-18 DIAGNOSIS — I10 Essential (primary) hypertension: Secondary | ICD-10-CM | POA: Diagnosis present

## 2018-11-18 DIAGNOSIS — J441 Chronic obstructive pulmonary disease with (acute) exacerbation: Secondary | ICD-10-CM | POA: Diagnosis not present

## 2018-11-18 DIAGNOSIS — J9601 Acute respiratory failure with hypoxia: Secondary | ICD-10-CM | POA: Diagnosis present

## 2018-11-18 HISTORY — DX: Chronic systolic (congestive) heart failure: I50.22

## 2018-11-18 LAB — BASIC METABOLIC PANEL
Anion gap: 11 (ref 5–15)
BUN: 6 mg/dL — ABNORMAL LOW (ref 8–23)
CO2: 25 mmol/L (ref 22–32)
Calcium: 9.1 mg/dL (ref 8.9–10.3)
Chloride: 98 mmol/L (ref 98–111)
Creatinine, Ser: 0.86 mg/dL (ref 0.61–1.24)
GFR calc Af Amer: >60 mL/min (ref >60)
GFR calc non Af Amer: >60 mL/min (ref >60)
Glucose, Bld: 210 mg/dL — ABNORMAL HIGH (ref 70–99)
Potassium: 4.2 mmol/L (ref 3.5–5.1)
Sodium: 134 mmol/L — ABNORMAL LOW (ref 135–145)

## 2018-11-18 LAB — CBC
HCT: 37.7 % — ABNORMAL LOW (ref 39.0–52.0)
Hemoglobin: 12.2 g/dL — ABNORMAL LOW (ref 13.0–17.0)
MCH: 31 pg (ref 26.0–34.0)
MCHC: 32.4 g/dL (ref 30.0–36.0)
MCV: 95.7 fL (ref 80.0–100.0)
Platelets: 308 10*3/uL (ref 150–400)
RBC: 3.94 MIL/uL — ABNORMAL LOW (ref 4.22–5.81)
RDW: 15.1 % (ref 11.5–15.5)
WBC: 11.8 10*3/uL — ABNORMAL HIGH (ref 4.0–10.5)
nRBC: 0 % (ref 0.0–0.2)

## 2018-11-18 LAB — TROPONIN I (HIGH SENSITIVITY): Troponin I (High Sensitivity): 10 ng/L (ref <18)

## 2018-11-18 LAB — SARS CORONAVIRUS 2 BY RT PCR (HOSPITAL ORDER, PERFORMED IN ~~LOC~~ HOSPITAL LAB): SARS Coronavirus 2: NEGATIVE

## 2018-11-18 MED ORDER — ENOXAPARIN SODIUM 40 MG/0.4ML ~~LOC~~ SOLN
40.0000 mg | SUBCUTANEOUS | Status: DC
  Administered 2018-11-19: 40 mg via SUBCUTANEOUS
  Filled 2018-11-18: qty 0.4

## 2018-11-18 MED ORDER — METHYLPREDNISOLONE SODIUM SUCC 125 MG IJ SOLR
125.0000 mg | Freq: Once | INTRAMUSCULAR | Status: AC
  Administered 2018-11-18: 125 mg via INTRAVENOUS
  Filled 2018-11-18: qty 2

## 2018-11-18 MED ORDER — CETIRIZINE HCL 10 MG PO TABS
5.0000 mg | ORAL_TABLET | Freq: Every day | ORAL | Status: DC
  Administered 2018-11-19: 5 mg via ORAL
  Filled 2018-11-18: qty 1

## 2018-11-18 MED ORDER — IPRATROPIUM-ALBUTEROL 0.5-2.5 (3) MG/3ML IN SOLN
3.0000 mL | Freq: Once | RESPIRATORY_TRACT | Status: AC
  Administered 2018-11-18: 3 mL via RESPIRATORY_TRACT
  Filled 2018-11-18: qty 3

## 2018-11-18 MED ORDER — ONDANSETRON HCL 4 MG/2ML IJ SOLN: 4.0000 mg | Freq: Four times a day (QID) | INTRAMUSCULAR | Status: DC | PRN

## 2018-11-18 MED ORDER — ONDANSETRON HCL 4 MG PO TABS: 4.0000 mg | ORAL_TABLET | Freq: Four times a day (QID) | ORAL | Status: DC | PRN

## 2018-11-18 MED ORDER — FUROSEMIDE 40 MG PO TABS
40.0000 mg | ORAL_TABLET | Freq: Two times a day (BID) | ORAL | Status: DC
  Administered 2018-11-19: 40 mg via ORAL
  Filled 2018-11-18: qty 1

## 2018-11-18 MED ORDER — IBUPROFEN 400 MG PO TABS: 400.0000 mg | ORAL_TABLET | Freq: Four times a day (QID) | ORAL | Status: DC | PRN

## 2018-11-18 MED ORDER — ASPIRIN EC 81 MG PO TBEC
81.0000 mg | DELAYED_RELEASE_TABLET | Freq: Every day | ORAL | Status: DC
  Administered 2018-11-19: 81 mg via ORAL
  Filled 2018-11-18: qty 1

## 2018-11-18 MED ORDER — SPIRONOLACTONE 25 MG PO TABS
12.5000 mg | ORAL_TABLET | Freq: Every day | ORAL | Status: DC
  Administered 2018-11-19: 12.5 mg via ORAL
  Filled 2018-11-18: qty 1
  Filled 2018-11-18: qty 0.5

## 2018-11-18 MED ORDER — METHYLPREDNISOLONE SODIUM SUCC 125 MG IJ SOLR
60.0000 mg | Freq: Four times a day (QID) | INTRAMUSCULAR | Status: DC
  Administered 2018-11-19 (×3): 60 mg via INTRAVENOUS
  Filled 2018-11-18 (×3): qty 2

## 2018-11-18 MED ORDER — AZITHROMYCIN 250 MG PO TABS
500.0000 mg | ORAL_TABLET | Freq: Every day | ORAL | Status: DC
  Administered 2018-11-19: 500 mg via ORAL
  Filled 2018-11-18: qty 2

## 2018-11-18 MED ORDER — CLOPIDOGREL BISULFATE 75 MG PO TABS
75.0000 mg | ORAL_TABLET | Freq: Every day | ORAL | Status: DC
  Administered 2018-11-19: 75 mg via ORAL
  Filled 2018-11-18: qty 1

## 2018-11-18 MED ORDER — METOPROLOL SUCCINATE ER 100 MG PO TB24
100.0000 mg | ORAL_TABLET | Freq: Every day | ORAL | Status: DC
  Administered 2018-11-19: 100 mg via ORAL
  Filled 2018-11-18: qty 1

## 2018-11-18 MED ORDER — SACUBITRIL-VALSARTAN 49-51 MG PO TABS
1.0000 | ORAL_TABLET | Freq: Two times a day (BID) | ORAL | Status: DC
  Administered 2018-11-19 (×2): 1 via ORAL
  Filled 2018-11-18 (×3): qty 1

## 2018-11-18 MED ORDER — LIDOCAINE 5 % EX PTCH
1.0000 | MEDICATED_PATCH | CUTANEOUS | Status: DC
  Administered 2018-11-18 – 2018-11-19 (×2): 1 via TRANSDERMAL
  Filled 2018-11-18 (×2): qty 1

## 2018-11-18 MED ORDER — HYDROCODONE-ACETAMINOPHEN 5-325 MG PO TABS
1.0000 | ORAL_TABLET | Freq: Once | ORAL | Status: AC
  Administered 2018-11-18: 1 via ORAL
  Filled 2018-11-18: qty 1

## 2018-11-18 MED ORDER — FAMOTIDINE 20 MG PO TABS
40.0000 mg | ORAL_TABLET | Freq: Every day | ORAL | Status: DC
  Administered 2018-11-19: 40 mg via ORAL
  Filled 2018-11-18: qty 2

## 2018-11-18 MED ORDER — ATORVASTATIN CALCIUM 20 MG PO TABS: 40.0000 mg | ORAL_TABLET | Freq: Every day | ORAL | Status: DC

## 2018-11-18 MED ORDER — MOMETASONE FURO-FORMOTEROL FUM 200-5 MCG/ACT IN AERO
2.0000 | INHALATION_SPRAY | Freq: Two times a day (BID) | RESPIRATORY_TRACT | Status: DC
  Administered 2018-11-19 (×2): 2 via RESPIRATORY_TRACT
  Filled 2018-11-18: qty 8.8

## 2018-11-18 MED ORDER — IPRATROPIUM-ALBUTEROL 0.5-2.5 (3) MG/3ML IN SOLN
3.0000 mL | RESPIRATORY_TRACT | Status: DC | PRN
  Administered 2018-11-19 (×2): 3 mL via RESPIRATORY_TRACT
  Filled 2018-11-18 (×2): qty 3

## 2018-11-18 MED ORDER — BUPROPION HCL 75 MG PO TABS
75.0000 mg | ORAL_TABLET | Freq: Every day | ORAL | Status: DC
  Administered 2018-11-19: 75 mg via ORAL
  Filled 2018-11-18: qty 1

## 2018-11-18 NOTE — ED Notes (Signed)
NAD noted at this time. No new orders.

## 2018-11-18 NOTE — ED Notes (Signed)
ED TO INPATIENT HANDOFF REPORT  ED Nurse Name and Phone #: [phone]  S Name/Age/Gender [name] 63 y.o. male Room/Bed: ED18A/ED18A  Code Status   Code Status: Prior  Home/SNF/Other Home A/Ox4Is this baseline? Yes   Triage Complete: Triage complete  Chief Complaint syncope  Triage Note Patient states he had syncopal episode on Tuesday. Reports history of similar episodes. Patient states he fell and landed on the right side. Reports since then has had pain in right ribs. Denies hitting head or any other injuries   Allergies No Known Allergies  Level of Care/Admitting Diagnosis ED Disposition    None      B Medical/Surgery History Past Medical History:  Diagnosis Date  . CAD (coronary artery disease)    s/p stents in 2017  . CHF (congestive heart failure) ([place])   . COPD (chronic obstructive pulmonary disease) ([place])   . Diabetes ([place])   . GERD (gastroesophageal reflux disease)   . Heart attack ([place])   . Hyperlipidemia   . Hypertension   . Presence of permanent cardiac pacemaker    and defibrillator  . Shortness of breath dyspnea   . Sleep apnea    Past Surgical History:  Procedure Laterality Date  . cardiac stents    . CARDIAC SURGERY     defibrillator and pacemaker  . CORONARY ANGIOPLASTY    . ESOPHAGOGASTRODUODENOSCOPY N/A 09/19/2014   Procedure: ESOPHAGOGASTRODUODENOSCOPY (EGD);  Surgeon: Lucilla Lame, MD;  Location: [facility] ENDOSCOPY;  Service: Endoscopy;  Laterality: N/A;  . ESOPHAGOGASTRODUODENOSCOPY (EGD) WITH PROPOFOL N/A 09/28/2017   Procedure: ESOPHAGOGASTRODUODENOSCOPY (EGD) WITH PROPOFOL;  Surgeon: Virgel Manifold, MD;  Location: [facility] ENDOSCOPY;  Service: Endoscopy;  Laterality: N/A;  . ESOPHAGOGASTRODUODENOSCOPY (EGD) WITH PROPOFOL N/A 04/19/2018   Procedure: ESOPHAGOGASTRODUODENOSCOPY (EGD) WITH PROPOFOL with Gastric Mapping;  Surgeon: [name], MD;  Location: [facility] ENDOSCOPY;  Service: Gastroenterology;  Laterality: N/A;     A IV  Location/Drains/Wounds Patient Lines/Drains/Airways Status   Active Line/Drains/Airways    Name:   Placement date:   Placement time:   Site:   Days:   Peripheral IV 11/18/18 Left Antecubital   11/18/18    1524    Antecubital   less than 1          Intake/Output Last 24 hours No intake or output data in the 24 hours ending 11/18/18 1747  Labs/Imaging Results for orders placed or performed during the hospital encounter of 11/18/18 (from the past 48 hour(s))  Basic metabolic panel     Status: Abnormal   Collection Time: 11/18/18 11:55 AM  Result Value Ref Range   Sodium 134 (L) 135 - 145 mmol/L   Potassium 4.2 3.5 - 5.1 mmol/L   Chloride 98 98 - 111 mmol/L   CO2 25 22 - 32 mmol/L   Glucose, Bld 210 (H) 70 - 99 mg/dL   BUN 6 (L) 8 - 23 mg/dL   Creatinine, Ser 0.86 0.61 - 1.24 mg/dL   Calcium 9.1 8.9 - 10.3 mg/dL   GFR calc non Af Amer >60 >60 mL/min   GFR calc Af Amer >60 >60 mL/min   Anion gap 11 5 - 15    Comment: Performed at [facility], [address] [place], [place] [address]  CBC     Status: Abnormal   Collection Time: 11/18/18 11:55 AM  Result Value Ref Range   WBC 11.8 (H) 4.0 - 10.5 K/uL   RBC 3.94 (L) 4.22 - 5.81 MIL/uL   Hemoglobin 12.2 (L)  13.0 - 17.0 g/dL   HCT 37.7 (L) 39.0 - 52.0 %   MCV 95.7 80.0 - 100.0 fL   MCH 31.0 26.0 - 34.0 pg   MCHC 32.4 30.0 - 36.0 g/dL   RDW 15.1 11.5 - 15.5 %   Platelets 308 150 - 400 K/uL   nRBC 0.0 0.0 - 0.2 %    Comment: Performed at [facility], [address] [place], [place] [address]   Dg Ribs Unilateral W/chest Right  Result Date: 11/18/2018 CLINICAL DATA:  Status post fall, rib pain EXAM: RIGHT RIBS AND CHEST - 3+ VIEW COMPARISON:  None. FINDINGS: No fracture or other bone lesions are seen involving the ribs. There is no evidence of pneumothorax or pleural effusion. Both lungs are clear. Heart size and mediastinal contours are within normal limits. Dual lead cardiac pacemaker. IMPRESSION: Negative.  Electronically Signed   By: Kathreen Devoid   On: 11/18/2018 12:28   Ct Chest Wo Contrast  Result Date: 11/18/2018 CLINICAL DATA:  Right chest pain since a fall due to a syncopal episode 11/16/2018. Initial encounter. EXAM: CT CHEST WITHOUT CONTRAST TECHNIQUE: Multidetector CT imaging of the chest was performed following the standard protocol without IV contrast. COMPARISON:  Plain film of the scratch the plain films of the chest and right ribs today. FINDINGS: Cardiovascular: Heart size is normal. Leads from pacing device are noted. There is calcific aortic and coronary atherosclerosis. No pericardial effusion Mediastinum/Nodes: No enlarged mediastinal or axillary lymph nodes. Thyroid gland, trachea, and esophagus demonstrate no significant findings. Lungs/Pleura: No pneumothorax. The lungs are emphysematous. Patchy area of airspace disease is seen in the left lung base. A nodule in the anterior right upper lobe measuring 0.4 cm on image 48, series 3 is identified. There is a second nodule measuring 1.1 cm x 0.8 cm in the right lower lobe on image 76 of series 3. Small area of mucous plugging in the left upper lobe is noted. There is a ground-glass attenuating nodule in the right upper lobe measuring 1.2 x 0.9 cm on image 41 of series 3. Upper Abdomen: The liver is low attenuating consistent with fatty infiltration. Status post cholecystectomy. Musculoskeletal: No fracture or focal bony lesion. IMPRESSION: Negative for fracture. Airspace disease in the left lower lobe could be atelectasis or pneumonia. Bilateral pulmonary nodules including a ground-glass attenuating nodule in the right upper lobe measuring 1 cm in diameter and a solid nodule in the right lower lobe measuring 1.1 cm in diameter. PET CT scan recommended for further evaluation. Extensive coronary artery atherosclerosis. Extensive aortic atherosclerosis (ICD10-I70.0) and emphysema (ICD10-J43.9). Electronically Signed   By: Inge Rise M.D.   On:  11/18/2018 16:27    Pending Labs Unresulted Labs (From admission, onward)    Start     Ordered   11/18/18 1746  SARS Coronavirus 2 (CEPHEID- Performed in [place] hospital lab), Hosp Order  (Symptomatic Patients Labs with Precautions )  ONCE - STAT,   STAT     11/18/18 1745          Vitals/Pain Today's Vitals   11/18/18 1530 11/18/18 1650 11/18/18 1700 11/18/18 1746  BP: 125/86  123/71   Pulse: 73  72   Resp:      Temp:      TempSrc:      SpO2: 99%  97%   Weight:      Height:      PainSc:  6   6     Isolation Precautions Airborne  and Contact precautions  Medications Medications  lidocaine (LIDODERM) 5 % 1 patch (1 patch Transdermal Patch Applied 11/18/18 1541)  ipratropium-albuterol (DUONEB) 0.5-2.5 (3) MG/3ML nebulizer solution 3 mL (has no administration in time range)  methylPREDNISolone sodium succinate (SOLU-MEDROL) 125 mg/2 mL injection 125 mg (has no administration in time range)  ipratropium-albuterol (DUONEB) 0.5-2.5 (3) MG/3ML nebulizer solution 3 mL (3 mLs Nebulization Given 11/18/18 1541)  HYDROcodone-acetaminophen (NORCO/VICODIN) 5-325 MG per tablet 1 tablet (1 tablet Oral Given 11/18/18 1541)    Mobility walks Low fall risk      R Recommendations: See Admitting Provider Note  Report given to:   Additional Notes: pt able to ambulate w/o assistance. Per MD order pt ambulated in room and oxygen dropped to 84% on RA while ambulating.

## 2018-11-18 NOTE — H&P (Signed)
[place] [facility] [place] NAME: John Frank    MR#:  [id number]  DATE OF BIRTH:  [birth year]  DATE OF ADMISSION:  11/18/2018  PRIMARY CARE PHYSICIAN: Idelle Crouch, MD   REQUESTING/REFERRING PHYSICIAN: Quentin Cornwall, MD  CHIEF COMPLAINT:   Chief Complaint  Patient presents with  . Rib Injury  . Loss of Consciousness    HISTORY OF PRESENT ILLNESS:  John Frank  is a 63 y.o. male who presents with chief complaint as above.  Patient presents to the ED with a complaint of a fall a few weeks ago.  On arrival here he was hypoxic, with O2 sats in the mid 80s.  He does state that he has been having some syncopal events.  He was placed on some supplemental O2 here in the ED with good improvement in his oxygen saturation.  Work-up in the ED is largely within normal limits, though somewhat suggestive of possible COPD exacerbation.  Hospitalist were called for admission  PAST MEDICAL HISTORY:   Past Medical History:  Diagnosis Date  . CAD (coronary artery disease)    s/p stents in 2017  . Chronic systolic CHF (congestive heart failure) ([place])   . COPD (chronic obstructive pulmonary disease) ([place])   . Diabetes ([place])   . GERD (gastroesophageal reflux disease)   . Heart attack ([place])   . Hyperlipidemia   . Hypertension   . Presence of permanent cardiac pacemaker    and defibrillator  . Shortness of breath dyspnea   . Sleep apnea      PAST SURGICAL HISTORY:   Past Surgical History:  Procedure Laterality Date  . cardiac stents    . CARDIAC SURGERY     defibrillator and pacemaker  . CORONARY ANGIOPLASTY    . ESOPHAGOGASTRODUODENOSCOPY N/A 09/19/2014   Procedure: ESOPHAGOGASTRODUODENOSCOPY (EGD);  Surgeon: Lucilla Lame, MD;  Location: [facility] ENDOSCOPY;  Service: Endoscopy;  Laterality: N/A;  . ESOPHAGOGASTRODUODENOSCOPY (EGD) WITH PROPOFOL N/A 09/28/2017   Procedure: ESOPHAGOGASTRODUODENOSCOPY (EGD) WITH PROPOFOL;  Surgeon: Virgel Manifold,  MD;  Location: [facility] ENDOSCOPY;  Service: Endoscopy;  Laterality: N/A;  . ESOPHAGOGASTRODUODENOSCOPY (EGD) WITH PROPOFOL N/A 04/19/2018   Procedure: ESOPHAGOGASTRODUODENOSCOPY (EGD) WITH PROPOFOL with Gastric Mapping;  Surgeon: [name], MD;  Location: [facility] ENDOSCOPY;  Service: Gastroenterology;  Laterality: N/A;     SOCIAL HISTORY:   Social History   Tobacco Use  . Smoking status: Current Every Day Smoker    Packs/day: 0.25    Years: 40.00    Pack years: 10.00    Types: Cigarettes  . Smokeless tobacco: Former Network engineer Use Topics  . Alcohol use: No     FAMILY HISTORY:   Family History  Problem Relation Age of Onset  . Diabetes Mother   . Heart attack Father 25     DRUG ALLERGIES:  No Known Allergies  MEDICATIONS AT HOME:   Prior to Admission medications   Medication Sig Start Date End Date Taking? Authorizing Provider  aspirin EC 81 MG tablet Take 81 mg by mouth daily.   Yes [provider]  atorvastatin (LIPITOR) 40 MG tablet TAKE 1 TABLET (40 MG TOTAL) BY MOUTH DAILY. 03/08/18  Yes Cannady, Jolene T, NP  budesonide-formoterol (SYMBICORT) 160-4.5 MCG/ACT inhaler Inhale 2 puffs into the lungs 2 (two) times daily. 06/15/15  Yes Kathrine Haddock, NP  clopidogrel (PLAVIX) 75 MG tablet TAKE 1 TABLET (75 MG TOTAL) BY MOUTH DAILY. 12/08/16  Yes Kathrine Haddock, NP  famotidine (PEPCID) 40  MG tablet TAKE 1 TABLET BY MOUTH EVERY DAY 09/09/17  Yes Kathrine Haddock, NP  furosemide (LASIX) 40 MG tablet Take 1 tablet (40 mg total) by mouth daily. Patient taking differently: Take 40 mg by mouth 2 (two) times daily.  08/11/17  Yes Wieting, Richard, MD  ipratropium-albuterol (DUONEB) 0.5-2.5 (3) MG/3ML SOLN Take 3 mLs by nebulization every 6 (six) hours as needed (wheezing; shortness of breath).   Yes [provider]  JANUVIA 100 MG tablet TAKE 1 TABLET (100 MG TOTAL) BY MOUTH DAILY. 09/14/17  Yes Kathrine Haddock, NP  levocetirizine (XYZAL) 5 MG tablet Take 5 mg by mouth  daily. 11/10/18  Yes [provider]  metFORMIN (GLUCOPHAGE) 1000 MG tablet TAKE 1 TABLET (1,000 MG TOTAL) BY MOUTH 2 (TWO) TIMES DAILY WITH A MEAL. 09/12/16  Yes Kathrine Haddock, NP  metoprolol succinate (TOPROL-XL) 100 MG 24 hr tablet Take 100 mg by mouth daily.  08/19/17  Yes [provider]  sacubitril-valsartan (ENTRESTO) 49-51 MG Take 1 tablet by mouth 2 (two) times daily. 06/14/18  Yes Darylene Price A, FNP  spironolactone (ALDACTONE) 25 MG tablet TAKE 1/2 TABLET BY MOUTH EVERY DAY 10/04/18  Yes Darylene Price A, FNP  albuterol (PROVENTIL HFA;VENTOLIN HFA) 108 (90 Base) MCG/ACT inhaler Inhale 2 puffs into the lungs every 6 (six) hours as needed for wheezing or shortness of breath. 05/26/17   Henreitta Leber, MD  buPROPion (WELLBUTRIN) 75 MG tablet Take 75 mg by mouth daily. 03/26/16 10/19/18  [provider]  ONE TOUCH ULTRA TEST test strip USE TO TEST BLOOD SUGAR ONCE DAILY 10/07/16   Wynetta Emery, Megan P, DO  pantoprazole (PROTONIX) 40 MG tablet Take 1 tablet (40 mg total) by mouth every morning. Patient not taking: Reported on 11/18/2018 06/29/17   [name], MD  sucralfate (CARAFATE) 1 g tablet Take 1 g by mouth 4 (four) times daily -  with meals and at bedtime.  05/15/17 10/19/18  [provider]    REVIEW OF SYSTEMS:  Review of Systems  Constitutional: Negative for chills, fever, malaise/fatigue and weight loss.  HENT: Negative for ear pain, hearing loss and tinnitus.   Eyes: Negative for blurred vision, double vision, pain and redness.  Respiratory: Positive for wheezing. Negative for cough, hemoptysis and shortness of breath.   Cardiovascular: Negative for chest pain, palpitations, orthopnea and leg swelling.  Gastrointestinal: Negative for abdominal pain, constipation, diarrhea, nausea and vomiting.  Genitourinary: Negative for dysuria, frequency and hematuria.  Musculoskeletal: Negative for back pain, joint pain and neck pain.  Skin:       No acne, rash, or  lesions  Neurological: Positive for loss of consciousness. Negative for dizziness, tremors, focal weakness and weakness.  Endo/Heme/Allergies: Negative for polydipsia. Does not bruise/bleed easily.  Psychiatric/Behavioral: Negative for depression. The patient is not nervous/anxious and does not have insomnia.      VITAL SIGNS:   Vitals:   11/18/18 1930 11/18/18 2000 11/18/18 2030 11/18/18 2100  BP: 101/81 118/84 126/77 112/69  Pulse: 73 89 80 77  Resp:   19   Temp:      TempSrc:      SpO2: 96% 97% 96% 98%  Weight:      Height:       Wt Readings from Last 3 Encounters:  11/18/18 61.2 kg  11/16/18 61.2 kg  10/19/18 72.1 kg    PHYSICAL EXAMINATION:  Physical Exam  Vitals reviewed. Constitutional: He is oriented to person, place, and time. He appears well-developed and well-nourished.  No distress.  HENT:  Head: Normocephalic and atraumatic.  Mouth/Throat: Oropharynx is clear and moist.  Eyes: Pupils are equal, round, and reactive to light. Conjunctivae and EOM are normal. No scleral icterus.  Neck: Normal range of motion. Neck supple. No JVD present. No thyromegaly present.  Cardiovascular: Normal rate, regular rhythm and intact distal pulses. Exam reveals no gallop and no friction rub.  No murmur heard. Respiratory: Effort normal. No respiratory distress. He has wheezes. He has no rales.  GI: Soft. Bowel sounds are normal. He exhibits no distension. There is no abdominal tenderness.  Musculoskeletal: Normal range of motion.        General: No edema.     Comments: No arthritis, no gout  Lymphadenopathy:    He has no cervical adenopathy.  Neurological: He is alert and oriented to person, place, and time. No cranial nerve deficit.  No dysarthria, no aphasia  Skin: Skin is warm and dry. No rash noted. No erythema.  Psychiatric: He has a normal mood and affect. His behavior is normal. Judgment and thought content normal.    LABORATORY PANEL:   CBC Recent Labs  Lab  11/18/18 1155  WBC 11.8*  HGB 12.2*  HCT 37.7*  PLT 308   ------------------------------------------------------------------------------------------------------------------  Chemistries  Recent Labs  Lab 11/18/18 1155  NA 134*  K 4.2  CL 98  CO2 25  GLUCOSE 210*  BUN 6*  CREATININE 0.86  CALCIUM 9.1   ------------------------------------------------------------------------------------------------------------------  Cardiac Enzymes No results for input(s): TROPONINI in the last 168 hours. ------------------------------------------------------------------------------------------------------------------  RADIOLOGY:  Dg Ribs Unilateral W/chest Right  Result Date: 11/18/2018 CLINICAL DATA:  Status post fall, rib pain EXAM: RIGHT RIBS AND CHEST - 3+ VIEW COMPARISON:  None. FINDINGS: No fracture or other bone lesions are seen involving the ribs. There is no evidence of pneumothorax or pleural effusion. Both lungs are clear. Heart size and mediastinal contours are within normal limits. Dual lead cardiac pacemaker. IMPRESSION: Negative. Electronically Signed   By: Kathreen Devoid   On: 11/18/2018 12:28   Ct Chest Wo Contrast  Result Date: 11/18/2018 CLINICAL DATA:  Right chest pain since a fall due to a syncopal episode 11/16/2018. Initial encounter. EXAM: CT CHEST WITHOUT CONTRAST TECHNIQUE: Multidetector CT imaging of the chest was performed following the standard protocol without IV contrast. COMPARISON:  Plain film of the scratch the plain films of the chest and right ribs today. FINDINGS: Cardiovascular: Heart size is normal. Leads from pacing device are noted. There is calcific aortic and coronary atherosclerosis. No pericardial effusion Mediastinum/Nodes: No enlarged mediastinal or axillary lymph nodes. Thyroid gland, trachea, and esophagus demonstrate no significant findings. Lungs/Pleura: No pneumothorax. The lungs are emphysematous. Patchy area of airspace disease is seen in the left  lung base. A nodule in the anterior right upper lobe measuring 0.4 cm on image 48, series 3 is identified. There is a second nodule measuring 1.1 cm x 0.8 cm in the right lower lobe on image 76 of series 3. Small area of mucous plugging in the left upper lobe is noted. There is a ground-glass attenuating nodule in the right upper lobe measuring 1.2 x 0.9 cm on image 41 of series 3. Upper Abdomen: The liver is low attenuating consistent with fatty infiltration. Status post cholecystectomy. Musculoskeletal: No fracture or focal bony lesion. IMPRESSION: Negative for fracture. Airspace disease in the left lower lobe could be atelectasis or pneumonia. Bilateral pulmonary nodules including a ground-glass attenuating nodule in the right upper lobe measuring 1 cm  in diameter and a solid nodule in the right lower lobe measuring 1.1 cm in diameter. PET CT scan recommended for further evaluation. Extensive coronary artery atherosclerosis. Extensive aortic atherosclerosis (ICD10-I70.0) and emphysema (ICD10-J43.9). Electronically Signed   By: Inge Rise M.D.   On: 11/18/2018 16:27    EKG:   Orders placed or performed during the hospital encounter of 11/18/18  . ED EKG  . ED EKG    IMPRESSION AND PLAN:  Principal Problem:   COPD with acute exacerbation (HCC) - IV solumedrol, PO azithromycin, PRN duonebs Active Problems:   Syncope - troponin was within normal limits.  Echocardiogram and cardiology consult ordered   CAD (coronary artery disease) - continue home meds, other workup as above   Diabetes mellitus without complication (HCC) - sliding scale insulin coverage   HTN (hypertension) - home dose antihypertensives   Chronic systolic heart failure ([place]) -continue home meds   Sleep apnea -CPAP nightly   GERD (gastroesophageal reflux disease) -home dose PPI   Hyperlipidemia -home dose antilipid   Cirrhosis ([place]) -avoid hepatotoxins  Chart review performed and case discussed with ED provider. Labs,  imaging and/or ECG reviewed by provider and discussed with patient/family. Management plans discussed with the patient and/or family.  COVID-19 status: Tested negative     DVT PROPHYLAXIS: SubQ lovenox   GI PROPHYLAXIS:  PPI   ADMISSION STATUS: Inpatient     CODE STATUS: Full Code Status History    Date Active Date Inactive Code Status Order ID Comments User Context   05/25/2018 1926 05/28/2018 1538 Full Code [id number]  Gladstone Lighter, MD Inpatient   08/09/2017 1444 08/11/2017 1613 Full Code [id number]  Gladstone Lighter, MD Inpatient   05/26/2017 0211 05/26/2017 1627 Full Code [id number]  Lance Coon, MD Inpatient   12/24/2014 0728 12/25/2014 1359 Full Code [id number]  Hillary Bow, MD ED   Advance Care Planning Activity      TOTAL TIME TAKING CARE OF THIS PATIENT: 45 minutes.   This patient was evaluated in the context of the global COVID-19 pandemic, which necessitated consideration that the patient might be at risk for infection with the SARS-CoV-2 virus that causes COVID-19. Institutional protocols and algorithms that pertain to the evaluation of patients at risk for COVID-19 are in a state of rapid change based on information released by regulatory bodies including the CDC and federal and state organizations. These policies and algorithms were followed to the best of this provider's knowledge to date during the patient's care at this facility.  Ethlyn Daniels 11/18/2018, 9:14 PM  [facility]  Office  [phone]  CC: Primary care physician; Idelle Crouch, MD  Note:  This document was prepared using Dragon voice recognition software and may include unintentional dictation errors.

## 2018-11-18 NOTE — ED Notes (Signed)
Patient transported to CT 

## 2018-11-18 NOTE — ED Notes (Signed)
Patient denies pain and is resting comfortably.  

## 2018-11-18 NOTE — ED Notes (Signed)
Pt able to ambulate to toilet with a steady gait /NAD.

## 2018-11-18 NOTE — ED Notes (Addendum)
Pacemaker [employer] interrogated per MD Quentin Cornwall request. Data sent. Transmission completed. No errors.

## 2018-11-18 NOTE — ED Notes (Signed)
Pt took his [place]. Pt sating at 18 w/o [place]. This RN educated pt regarding the importance of keeping his [place] in place. This Rn placed [place] back in place resulting in 96% O2.

## 2018-11-18 NOTE — ED Notes (Signed)
Pt provided with phone to contact his POC per pt's request.

## 2018-11-18 NOTE — ED Notes (Signed)
This RN spoke with lab tech, Mohammed. Troponin collected/received, per lab tech,it is  in process.

## 2018-11-18 NOTE — ED Provider Notes (Signed)
[facility] Emergency Department Provider Note    First MD Initiated Contact with Patient 11/18/18 1527     (approximate)  I have reviewed the triage vital signs and the nursing notes.   HISTORY  Chief Complaint Rib Injury and Loss of Consciousness    HPI DECKLIN WEDDINGTON is a 63 y.o. male listed past medical history presents the ER for 2 days of right chest wall pain that occurred after he fell after getting up out of bed.  States he has a history of frequent syncopal events.  Does not wear home oxygen.  States he been having constant pain since then is worse with movement and palpation of the rib cage.  Denies any chest pressure.  Denies any shortness of breath.  Has been having worse pain with cough but denies any fever.  Did not hit his head.  Denies any headache or neck pain.  No abdominal pain.  No nausea or vomiting.    Past Medical History:  Diagnosis Date  . CAD (coronary artery disease)    s/p stents in 2017  . CHF (congestive heart failure) ([place])   . COPD (chronic obstructive pulmonary disease) ([place])   . Diabetes ([place])   . GERD (gastroesophageal reflux disease)   . Heart attack ([place])   . Hyperlipidemia   . Hypertension   . Presence of permanent cardiac pacemaker    and defibrillator  . Shortness of breath dyspnea   . Sleep apnea    Family History  Problem Relation Age of Onset  . Diabetes Mother   . Heart attack Father 22   Past Surgical History:  Procedure Laterality Date  . cardiac stents    . CARDIAC SURGERY     defibrillator and pacemaker  . CORONARY ANGIOPLASTY    . ESOPHAGOGASTRODUODENOSCOPY N/A 09/19/2014   Procedure: ESOPHAGOGASTRODUODENOSCOPY (EGD);  Surgeon: Lucilla Lame, MD;  Location[facility] ENDOSCOPY;  Service: Endoscopy;  Laterality: N/A;  . ESOPHAGOGASTRODUODENOSCOPY (EGD) WITH PROPOFOL N/A 09/28/2017   Procedure: ESOPHAGOGASTRODUODENOSCOPY (EGD) WITH PROPOFOL;  Surgeon: Virgel Manifold, MD;  Location: [facility] ENDOSCOPY;   Service: Endoscopy;  Laterality: N/A;  . ESOPHAGOGASTRODUODENOSCOPY (EGD) WITH PROPOFOL N/A 04/19/2018   Procedure: ESOPHAGOGASTRODUODENOSCOPY (EGD) WITH PROPOFOL with Gastric Mapping;  Surgeon: [name], MD;  Location: [facility] ENDOSCOPY;  Service: Gastroenterology;  Laterality: N/A;   Patient Active Problem List   Diagnosis Date Noted  . Iron deficiency anemia   . Cirrhosis of liver with ascites ([place])   . Chronic systolic heart failure ([place]) 08/31/2017  . CHF exacerbation ([place]) 08/09/2017  . Tobacco abuse 04/06/2015  . HTN (hypertension) 11/03/2014  . Sleep apnea 10/26/2014  . CAD (coronary artery disease) 10/26/2014  . GERD (gastroesophageal reflux disease) 10/26/2014  . Polyp of colon 10/26/2014  . Diabetes mellitus without complication ([place]) 70/05/7492  . Hyperlipidemia 10/26/2014  . COPD, severe ([place]) 10/26/2014  . Status cardiac pacemaker 10/26/2014  . Lesion of nasal septum 10/26/2014      Prior to Admission medications   Medication Sig Start Date End Date Taking? Authorizing Provider  albuterol (PROVENTIL HFA;VENTOLIN HFA) 108 (90 Base) MCG/ACT inhaler Inhale 2 puffs into the lungs every 6 (six) hours as needed for wheezing or shortness of breath. 05/26/17   Henreitta Leber, MD  aspirin EC 81 MG tablet Take 81 mg by mouth daily.    [provider]  atorvastatin (LIPITOR) 40 MG tablet TAKE 1 TABLET (40 MG TOTAL) BY MOUTH DAILY. 03/08/18   Venita Lick, NP  budesonide-formoterol (SYMBICORT) 160-4.5 MCG/ACT inhaler Inhale 2 puffs into the lungs 2 (two) times daily. 06/15/15   Kathrine Haddock, NP  buPROPion (WELLBUTRIN) 75 MG tablet Take 75 mg by mouth daily. 03/26/16 10/19/18  [provider]  clopidogrel (PLAVIX) 75 MG tablet TAKE 1 TABLET (75 MG TOTAL) BY MOUTH DAILY. 12/08/16   Kathrine Haddock, NP  famotidine (PEPCID) 40 MG tablet TAKE 1 TABLET BY MOUTH EVERY DAY 09/09/17   Kathrine Haddock, NP  furosemide (LASIX) 40 MG tablet Take 1 tablet (40 mg total) by mouth  daily. Patient taking differently: Take 40 mg by mouth 2 (two) times daily.  08/11/17   Loletha Grayer, MD  ipratropium-albuterol (DUONEB) 0.5-2.5 (3) MG/3ML SOLN Take 3 mLs by nebulization every 6 (six) hours as needed (wheezing; shortness of breath).    [provider]  JANUVIA 100 MG tablet TAKE 1 TABLET (100 MG TOTAL) BY MOUTH DAILY. 09/14/17   Kathrine Haddock, NP  levocetirizine (XYZAL) 5 MG tablet Take 5 mg by mouth daily. 11/10/18   [provider]  metFORMIN (GLUCOPHAGE) 1000 MG tablet TAKE 1 TABLET (1,000 MG TOTAL) BY MOUTH 2 (TWO) TIMES DAILY WITH A MEAL. 09/12/16   Kathrine Haddock, NP  metoprolol succinate (TOPROL-XL) 100 MG 24 hr tablet Take 100 mg by mouth daily.  08/19/17   [provider]  ONE TOUCH ULTRA TEST test strip USE TO TEST BLOOD SUGAR ONCE DAILY 10/07/16   Wynetta Emery, Megan P, DO  pantoprazole (PROTONIX) 40 MG tablet Take 1 tablet (40 mg total) by mouth every morning. Patient taking differently: Take 40 mg by mouth daily before breakfast.  06/29/17   [name], MD  sacubitril-valsartan (ENTRESTO) 49-51 MG Take 1 tablet by mouth 2 (two) times daily. 06/14/18   Alisa Graff, FNP  spironolactone (ALDACTONE) 25 MG tablet TAKE 1/2 TABLET BY MOUTH EVERY DAY 10/04/18   Darylene Price A, FNP  sucralfate (CARAFATE) 1 g tablet Take 1 g by mouth 4 (four) times daily -  with meals and at bedtime.  05/15/17 10/19/18  [provider]    Allergies Patient has no known allergies.    Social History Social History   Tobacco Use  . Smoking status: Current Every Day Smoker    Packs/day: 0.25    Years: 40.00    Pack years: 10.00    Types: Cigarettes  . Smokeless tobacco: Former Network engineer Use Topics  . Alcohol use: No  . Drug use: Never    Review of Systems Patient denies headaches, rhinorrhea, blurry vision, numbness, shortness of breath, chest pain, edema, cough, abdominal pain, nausea, vomiting, diarrhea, dysuria, fevers, rashes or hallucinations  unless otherwise stated above in HPI. ____________________________________________   PHYSICAL EXAM:  VITAL SIGNS: Vitals:   11/18/18 1830 11/18/18 1900  BP: 122/82 131/72  Pulse: 76 72  Resp:    Temp:    SpO2: 95% 98%    Constitutional: Alert and oriented.  Eyes: Conjunctivae are normal.  Head: Atraumatic. Nose: No congestion/rhinnorhea. Mouth/Throat: Mucous membranes are moist.   Neck: No stridor. Painless ROM.  Cardiovascular: Normal rate, regular rhythm. Grossly normal heart sounds.  Good peripheral circulation. Respiratory: mild respiratory distress, pursed lip breathing, diffuse wheeze, + tachypnea Gastrointestinal: Soft and nontender. No distention. No abdominal bruits. No CVA tenderness. Genitourinary:  Musculoskeletal: No lower extremity tenderness nor edema.  No joint effusions.  Right lower chest wall ttp without crepitus or ecchymosis Neurologic:  Normal speech and language. No gross focal neurologic deficits are appreciated. No facial  droop Skin:  Skin is warm, dry and intact. No rash noted. Psychiatric: Mood and affect are normal. Speech and behavior are normal.  ____________________________________________   LABS (all labs ordered are listed, but only abnormal results are displayed)  Results for orders placed or performed during the hospital encounter of 11/18/18 (from the past 24 hour(s))  Basic metabolic panel     Status: Abnormal   Collection Time: 11/18/18 11:55 AM  Result Value Ref Range   Sodium 134 (L) 135 - 145 mmol/L   Potassium 4.2 3.5 - 5.1 mmol/L   Chloride 98 98 - 111 mmol/L   CO2 25 22 - 32 mmol/L   Glucose, Bld 210 (H) 70 - 99 mg/dL   BUN 6 (L) 8 - 23 mg/dL   Creatinine, Ser 0.86 0.61 - 1.24 mg/dL   Calcium 9.1 8.9 - 10.3 mg/dL   GFR calc non Af Amer >60 >60 mL/min   GFR calc Af Amer >60 >60 mL/min   Anion gap 11 5 - 15  CBC     Status: Abnormal   Collection Time: 11/18/18 11:55 AM  Result Value Ref Range   WBC 11.8 (H) 4.0 - 10.5  K/uL   RBC 3.94 (L) 4.22 - 5.81 MIL/uL   Hemoglobin 12.2 (L) 13.0 - 17.0 g/dL   HCT 37.7 (L) 39.0 - 52.0 %   MCV 95.7 80.0 - 100.0 fL   MCH 31.0 26.0 - 34.0 pg   MCHC 32.4 30.0 - 36.0 g/dL   RDW 15.1 11.5 - 15.5 %   Platelets 308 150 - 400 K/uL   nRBC 0.0 0.0 - 0.2 %  Troponin I (High Sensitivity)     Status: None   Collection Time: 11/18/18  5:39 PM  Result Value Ref Range   Troponin I (High Sensitivity) 10 <18 ng/L  SARS Coronavirus 2 (CEPHEID- Performed in [place] hospital lab), Hosp Order     Status: None   Collection Time: 11/18/18  6:02 PM   Specimen: Nasopharyngeal Swab  Result Value Ref Range   SARS Coronavirus 2 NEGATIVE NEGATIVE   ____________________________________________  EKG My review and personal interpretation at Time: 11:42   Indication: chest wall pain  Rate: 75  Rhythm: sinus Axis: normal  Other: no stemi, poor r wave progression ____________________________________________  RADIOLOGY  I personally reviewed all radiographic images ordered to evaluate for the above acute complaints and reviewed radiology reports and findings.  These findings were personally discussed with the patient.  Please see medical record for radiology report.  ____________________________________________   PROCEDURES  Procedure(s) performed:  .Critical Care Performed by: Merlyn Lot, MD Authorized by: Merlyn Lot, MD   Critical care provider statement:    Critical care time (minutes):  30   Critical care time was exclusive of:  Separately billable procedures and treating other patients   Critical care was necessary to treat or prevent imminent or life-threatening deterioration of the following conditions:  Respiratory failure   Critical care was time spent personally by me on the following activities:  Development of treatment plan with patient or surrogate, discussions with consultants, evaluation of patient's response to treatment, examination of patient,  obtaining history from patient or surrogate, ordering and performing treatments and interventions, ordering and review of laboratory studies, ordering and review of radiographic studies, pulse oximetry, re-evaluation of patient's condition and review of old charts      Critical Care performed: yes ____________________________________________   INITIAL IMPRESSION / ASSESSMENT AND PLAN / ED COURSE  Pertinent  labs & imaging results that were available during my care of the patient were reviewed by me and considered in my medical decision making (see chart for details).   DDX: Asthma, copd, CHF, pna, ptx, malignancy, Pe, anemia   JAFET WISSING is a 63 y.o. who presents to the ED with multiple syncopal episodes with exertion over the past few weeks.  He denies any chest pain but is appearing very short of breath with pursed lip breathing mild tachypnea.  Not hypoxic at rest.  Does have diffuse wheezing.  Will give nebulizer.  EKG does not show any acute changes.  Will interrogate pacer.  No evidence of head trauma.  Chest x-ray and CT imaging will be ordered to exclude traumatic injury as he is having right anterior chest wall pain concerning for rib fractures.  Clinical Course as of Nov 17 1956  Thu Nov 18, 2018  1652 Was notified by [employer] pacer interrogation that there have been no events in the past several months.  Particularly there is no evidence of any ventricular dysrhythmia related to the patient's syncopal event.  Does have adequate battery life that was approaching need for replacement.   [PR]  1703 Patient reassessed.  Does not have any rest pain or any symptoms concerning for ACS.  Is musculoskeletal and reproducible after mechanical injury 2 days ago.  No evidence of fracture but is having pain consistent with contusion.  Patient able to ambulate with steady gait without any hypoxia.  Do not feel that further diagnostic testing clinically indicated.  He stable appropriate for  outpatient follow-up.   [PR]  1746 Patient was able to ambulate became significantly dyspneic desatted down to 84%.  Placed on supplemental oxygen.  Will give additional nebulizers will give steroids.  Will check for coronavirus.   [PR]  1955 Cover negative.  Will discuss with hospitalist for admission of this facility for COPD exacerbation with hypoxia.   [PR]    Clinical Course User Index [PR] Merlyn Lot, MD    The patient was evaluated in Emergency Department today for the symptoms described in the history of present illness. He/she was evaluated in the context of the global COVID-19 pandemic, which necessitated consideration that the patient might be at risk for infection with the SARS-CoV-2 virus that causes COVID-19. Institutional protocols and algorithms that pertain to the evaluation of patients at risk for COVID-19 are in a state of rapid change based on information released by regulatory bodies including the CDC and federal and state organizations. These policies and algorithms were followed during the patient's care in the ED.  As part of my medical decision making, I reviewed the following data within the [place] notes reviewed and incorporated, Labs reviewed, notes from prior ED visits and [place] Controlled Substance Database   ____________________________________________   FINAL CLINICAL IMPRESSION(S) / ED DIAGNOSES  Final diagnoses:  COPD with acute exacerbation ([place])  Acute respiratory failure with hypoxia ([place])      NEW MEDICATIONS STARTED DURING THIS VISIT:  New Prescriptions   No medications on file     Note:  This document was prepared using Dragon voice recognition software and may include unintentional dictation errors.    Merlyn Lot, MD 11/18/18 [place]

## 2018-11-18 NOTE — ED Notes (Signed)
Pt at 95% on 2L [place]

## 2018-11-18 NOTE — ED Triage Notes (Signed)
Patient states he had syncopal episode on Tuesday. Reports history of similar episodes. Patient states he fell and landed on the right side. Reports since then has had pain in right ribs. Denies hitting head or any other injuries

## 2018-11-18 NOTE — ED Notes (Signed)
Pt able to ambulate to toilet w/o assistance  w/a steady gait.

## 2018-11-18 NOTE — ED Notes (Signed)
This Rn has collected and sent troponin to lab. This RN has verified with lab tech that specimen has been collected by lab staff.

## 2018-11-18 NOTE — ED Notes (Addendum)
Pt st had a syncopal episode and fell on Tuesday 7/7. Since the fall pt c/o right  rib pain 10/10. Pt st taking tylenol at home for the pain w/o relief. Pt st "I take breathing treatments at home, I need one".

## 2018-11-18 NOTE — ED Notes (Addendum)
EDT Melody ambulated pt in room w/pulse ox on. This RN in room pt hypoxic sating at 84%on RA while ambulating in room. Pt placed back on bed, pt sating 96% on RA while at rest/bed.   Pt denies using continuous O2 at home.  MD Quentin Cornwall made aware.

## 2018-11-18 NOTE — ED Notes (Signed)
This RN spoke with lab tech, Myriam Jacobson regarding Coronavirus 2 (CEPHEID) collected/ sent at  [facility]. Per lab tech specimen is in process.

## 2018-11-19 ENCOUNTER — Inpatient Hospital Stay
Admit: 2018-11-19 | Discharge: 2018-11-19 | Disposition: A | Discharge status: Home / Self Care | Payer: Medicare Other | Attending: Internal Medicine | Admitting: Internal Medicine

## 2018-11-19 LAB — CBC
HCT: 37.4 % — ABNORMAL LOW (ref 39.0–52.0)
Hemoglobin: 12.3 g/dL — ABNORMAL LOW (ref 13.0–17.0)
MCH: 30.7 pg (ref 26.0–34.0)
MCHC: 32.9 g/dL (ref 30.0–36.0)
MCV: 93.3 fL (ref 80.0–100.0)
Platelets: 313 10*3/uL (ref 150–400)
RBC: 4.01 MIL/uL — ABNORMAL LOW (ref 4.22–5.81)
RDW: 15.1 % (ref 11.5–15.5)
WBC: 8 10*3/uL (ref 4.0–10.5)
nRBC: 0 % (ref 0.0–0.2)

## 2018-11-19 LAB — GLUCOSE, CAPILLARY
Glucose-Capillary: 487 mg/dL — ABNORMAL HIGH (ref 70–99)
Glucose-Capillary: 454 mg/dL — ABNORMAL HIGH (ref 70–99)
Glucose-Capillary: 222 mg/dL — ABNORMAL HIGH (ref 70–99)

## 2018-11-19 LAB — BASIC METABOLIC PANEL
Anion gap: 12 (ref 5–15)
BUN: 20 mg/dL (ref 8–23)
CO2: 23 mmol/L (ref 22–32)
Calcium: 9 mg/dL (ref 8.9–10.3)
Chloride: 95 mmol/L — ABNORMAL LOW (ref 98–111)
Creatinine, Ser: 1.14 mg/dL (ref 0.61–1.24)
GFR calc Af Amer: >60 mL/min (ref >60)
GFR calc non Af Amer: >60 mL/min (ref >60)
Glucose, Bld: 556 mg/dL (ref 70–99)
Potassium: 5.2 mmol/L — ABNORMAL HIGH (ref 3.5–5.1)
Sodium: 130 mmol/L — ABNORMAL LOW (ref 135–145)

## 2018-11-19 LAB — ECHOCARDIOGRAM COMPLETE
Height: 64 in
Weight: 2158.74 oz

## 2018-11-19 LAB — POTASSIUM: Potassium: 4.1 mmol/L (ref 3.5–5.1)

## 2018-11-19 MED ORDER — AZITHROMYCIN 500 MG PO TABS: 500.0000 mg | ORAL_TABLET | Freq: Every day | ORAL | 0 refills | Status: DC

## 2018-11-19 MED ORDER — PREDNISONE 50 MG PO TABS: 50.0000 mg | ORAL_TABLET | Freq: Every day | ORAL | 0 refills | Status: DC

## 2018-11-19 MED ORDER — INSULIN ASPART 100 UNIT/ML ~~LOC~~ SOLN
0.0000 [IU] | Freq: Three times a day (TID) | SUBCUTANEOUS | Status: DC
  Administered 2018-11-19: 15 [IU] via SUBCUTANEOUS
  Filled 2018-11-19: qty 1

## 2018-11-19 MED ORDER — ORAL CARE MOUTH RINSE
15.0000 mL | Freq: Two times a day (BID) | OROMUCOSAL | Status: DC
  Administered 2018-11-19: 15 mL via OROMUCOSAL

## 2018-11-19 MED ORDER — INSULIN ASPART 100 UNIT/ML ~~LOC~~ SOLN: 0.0000 [IU] | Freq: Every day | SUBCUTANEOUS | Status: DC

## 2018-11-19 MED ORDER — INSULIN ASPART 100 UNIT/ML ~~LOC~~ SOLN
10.0000 [IU] | Freq: Once | SUBCUTANEOUS | Status: AC
  Administered 2018-11-19: 10 [IU] via SUBCUTANEOUS
  Filled 2018-11-19: qty 1

## 2018-11-19 MED ORDER — AZITHROMYCIN 250 MG PO TABS: 500.0000 mg | ORAL_TABLET | Freq: Every day | ORAL | Status: DC

## 2018-11-19 MED ORDER — LINAGLIPTIN 5 MG PO TABS
5.0000 mg | ORAL_TABLET | Freq: Every day | ORAL | Status: DC
  Administered 2018-11-19: 5 mg via ORAL
  Filled 2018-11-19: qty 1

## 2018-11-19 MED ORDER — INSULIN ASPART 100 UNIT/ML ~~LOC~~ SOLN
15.0000 [IU] | Freq: Once | SUBCUTANEOUS | Status: AC
  Administered 2018-11-19: 15 [IU] via SUBCUTANEOUS
  Filled 2018-11-19: qty 1

## 2018-11-19 MED ORDER — INSULIN ASPART 100 UNIT/ML ~~LOC~~ SOLN: 0.0000 [IU] | Freq: Three times a day (TID) | SUBCUTANEOUS | Status: DC

## 2018-11-19 NOTE — Progress Notes (Signed)
Pt's serum glucose 556; notified MD Marcille Blanco and MD to place orders. Nursing staff will continue to monitor for any changes in patient status. Earleen Reaper, RN

## 2018-11-19 NOTE — Progress Notes (Signed)
Inpatient Diabetes Program Recommendations  AACE/ADA: New Consensus Statement on Inpatient Glycemic Control   Target Ranges:  Prepandial:   less than 140 mg/dL      Peak postprandial:   less than 180 mg/dL (1-2 hours)      Critically ill patients:  140 - 180 mg/dL   Results for ELOHIM, BRUNE (MRN [id number]) as of 11/19/2018 09:03  Ref. Range 11/16/2018 07:28 11/19/2018 08:10  Glucose-Capillary Latest Ref Range: 70 - 99 mg/dL 227 (H) 487 (H)   Review of Glycemic Control  Diabetes history: DM2 Outpatient Diabetes medications: Metformin 1000 mg BID, Januvia 100 mg daily Current orders for Inpatient glycemic control: Novolog 0-15 units TID with meals, Novolog 0-5 units QHS; Solumedrol 60 mg Q6H  Inpatient Diabetes Program Recommendations:   Insulin-Correction: Noted patient received Novolog 10 units at 6:37 am today and has an order for Novolog 15 units x 1 ordered for 9:15 am today.  Insulin-Meal Coverage: If steroids are continued, please consider ordering Novolog 4 units TID with meals.  Thanks, Barnie Alderman, RN, MSN, CDE Diabetes Coordinator Inpatient Diabetes Program [phone] (Team Pager from 8am to 5pm)

## 2018-11-19 NOTE — Care Management CC44 (Signed)
Condition Code 44 Documentation Completed  Patient Details  Name: John Frank MRN: [id number] Date of Birth: Jun 16, 1955   Condition Code 44 given:  Yes Patient signature on Condition Code 44 notice:  Yes Documentation of 2 MD's agreement:  Yes Code 44 added to claim:  Yes    Ross Ludwig, LCSW 11/19/2018, 2:55 PM

## 2018-11-19 NOTE — Progress Notes (Signed)
*  PRELIMINARY RESULTS* Echocardiogram 2D Echocardiogram has been performed.  John Frank John Frank 11/19/2018, 9:01 AM

## 2018-11-19 NOTE — Discharge Instructions (Signed)
It was so nice to meet you during this hospitalization!  You came into the hospital with falling and shortness of breath. We think you had a worsening of your COPD.  I have prescribed the following medications: 1. Please take Azithromycin (an antibiotic for your COPD) once daily for the next two days (starting tomorrow) 2. Please take Prednisone (a steroid) once daily for the next 4 days (starting tomorrow)  Take care, Dr. Brett Albino

## 2018-11-19 NOTE — Discharge Summary (Addendum)
[facility] NAME: Cache Bills    MR#:  [id number]  DATE OF BIRTH:  [birth year]  DATE OF ADMISSION:  11/18/2018   ADMITTING PHYSICIAN: Lance Coon, MD  DATE OF DISCHARGE: 11/19/18  PRIMARY CARE PHYSICIAN: Idelle Crouch, MD   ADMISSION DIAGNOSIS:  Acute respiratory failure with hypoxia ([place]) [J96.01] COPD with acute exacerbation (HCC) [J44.1] DISCHARGE DIAGNOSIS:  Principal Problem:   COPD with acute exacerbation ([place]) Active Problems:   Sleep apnea   CAD (coronary artery disease)   GERD (gastroesophageal reflux disease)   Diabetes mellitus without complication (HCC)   Hyperlipidemia   HTN (hypertension)   Syncope   Chronic systolic heart failure (HCC)   Cirrhosis of liver with ascites ([place])  SECONDARY DIAGNOSIS:   Past Medical History:  Diagnosis Date  . CAD (coronary artery disease)    s/p stents in 2017  . Chronic systolic CHF (congestive heart failure) ([place])   . COPD (chronic obstructive pulmonary disease) ([place])   . Diabetes ([place])   . GERD (gastroesophageal reflux disease)   . Heart attack ([place])   . Hyperlipidemia   . Hypertension   . Presence of permanent cardiac pacemaker    and defibrillator  . Shortness of breath dyspnea   . Sleep apnea    HOSPITAL COURSE:   John Frank is a 63 year old male who presented to the ED with a fall a few weeks ago.  On arrival to the ED, he was hypoxic with O2 sats in the mid 80s.  He was started on supplemental oxygen.  Chest x-ray was negative.  He was admitted for COPD exacerbation.  Acute hypoxic respiratory failure secondary to COPD exacerbation -Patient was initially requiring 2 L O2, but was transitioned to room air without any issues -On the day of discharge, patient stated that he felt "great" and was ready to go home -He was discharged home on azithromycin 500 mg daily for a total 3-day course and prednisone 50 mg daily for a total 5-day course -We ambulated patient  with pulse ox on the day of discharge and he was able to maintain his oxygen saturations >93%  Syncope- unclear etiology, may have been related to hypoxia. -Seen by cardiology, who did not recommend any additional cardiac work-up -ECHO showed EF 40 to 45% and impaired relaxation of the left ventricle -Cardiology recommended that he follow-up in their office in 1 to 2 weeks  Chronic systolic congestive heart failure- no signs of acute exacerbation -Home heart failure medicines were continued -Echo with improved EF compared to previous  CAD-no active chest pain -Continued home meds  Pulmonary nodule in the right upper lobe- incidental finding on CT chest -Needs PET CT scan as an outpatient for further evaluation  DISCHARGE CONDITIONS:  COPD exacerbation Syncope Chronic systolic congestive heart failure CAD Type 2 diabetes Hypertension OSA GERD Hyperlipidemia Liver cirrhosis CONSULTS OBTAINED:  Cardiology DRUG ALLERGIES:  No Known Allergies DISCHARGE MEDICATIONS:   Allergies as of 11/19/2018   No Known Allergies     Medication List    TAKE these medications   albuterol 108 (90 Base) MCG/ACT inhaler Commonly known as: VENTOLIN HFA Inhale 2 puffs into the lungs every 6 (six) hours as needed for wheezing or shortness of breath.   aspirin EC 81 MG tablet Take 81 mg by mouth daily.   atorvastatin 40 MG tablet Commonly known as: LIPITOR TAKE 1 TABLET (40 MG TOTAL) BY MOUTH DAILY.   azithromycin 500 MG tablet  Commonly known as: ZITHROMAX Take 1 tablet (500 mg total) by mouth daily. Start taking on: November 20, 2018   budesonide-formoterol 160-4.5 MCG/ACT inhaler Commonly known as: SYMBICORT Inhale 2 puffs into the lungs 2 (two) times daily.   buPROPion 75 MG tablet Commonly known as: WELLBUTRIN Take 75 mg by mouth daily.   clopidogrel 75 MG tablet Commonly known as: PLAVIX TAKE 1 TABLET (75 MG TOTAL) BY MOUTH DAILY.   famotidine 40 MG tablet Commonly known as:  PEPCID TAKE 1 TABLET BY MOUTH EVERY DAY   furosemide 40 MG tablet Commonly known as: Lasix Take 1 tablet (40 mg total) by mouth daily. What changed: when to take this   ipratropium-albuterol 0.5-2.5 (3) MG/3ML Soln Commonly known as: DUONEB Take 3 mLs by nebulization every 6 (six) hours as needed (wheezing; shortness of breath).   Januvia 100 MG tablet Generic drug: sitaGLIPtin TAKE 1 TABLET (100 MG TOTAL) BY MOUTH DAILY.   levocetirizine 5 MG tablet Commonly known as: XYZAL Take 5 mg by mouth daily.   metFORMIN 1000 MG tablet Commonly known as: GLUCOPHAGE TAKE 1 TABLET (1,000 MG TOTAL) BY MOUTH 2 (TWO) TIMES DAILY WITH A MEAL.   metoprolol succinate 100 MG 24 hr tablet Commonly known as: TOPROL-XL Take 100 mg by mouth daily.   ONE TOUCH ULTRA TEST test strip Generic drug: glucose blood USE TO TEST BLOOD SUGAR ONCE DAILY   pantoprazole 40 MG tablet Commonly known as: PROTONIX Take 1 tablet (40 mg total) by mouth every morning.   predniSONE 50 MG tablet Commonly known as: DELTASONE Take 1 tablet (50 mg total) by mouth daily with breakfast. Start taking on: November 20, 2018   sacubitril-valsartan 49-51 MG Commonly known as: ENTRESTO Take 1 tablet by mouth 2 (two) times daily.   spironolactone 25 MG tablet Commonly known as: ALDACTONE TAKE 1/2 TABLET BY MOUTH EVERY DAY   sucralfate 1 g tablet Commonly known as: CARAFATE Take 1 g by mouth 4 (four) times daily -  with meals and at bedtime.        DISCHARGE INSTRUCTIONS:  1.  Follow-up with PCP in 5 days 2.  Follow-up with cardiology in 1 to 2 weeks 3.  Take azithromycin 500 mg daily for the next 2 days, starting tomorrow 4.  Take prednisone 50 mg daily for the next 4 days, starting tomorrow DIET:  Cardiac diet DISCHARGE CONDITION:  Stable ACTIVITY:  Activity as tolerated OXYGEN:  Home Oxygen: No.  Oxygen Delivery: room air DISCHARGE LOCATION:  home   If you experience worsening of your admission  symptoms, develop shortness of breath, life threatening emergency, suicidal or homicidal thoughts you must seek medical attention immediately by calling 911 or calling your MD immediately  if symptoms less severe.  You Must read complete instructions/literature along with all the possible adverse reactions/side effects for all the Medicines you take and that have been prescribed to you. Take any new Medicines after you have completely understood and accpet all the possible adverse reactions/side effects.   Please note  You were cared for by a hospitalist during your hospital stay. If you have any questions about your discharge medications or the care you received while you were in the hospital after you are discharged, you can call the unit and asked to speak with the hospitalist on call if the hospitalist that took care of you is not available. Once you are discharged, your primary care physician will handle any further medical issues. Please note that NO REFILLS  for any discharge medications will be authorized once you are discharged, as it is imperative that you return to your primary care physician (or establish a relationship with a primary care physician if you do not have one) for your aftercare needs so that they can reassess your need for medications and monitor your lab values.    On the day of Discharge:  VITAL SIGNS:  Blood pressure 124/73, pulse 79, temperature 97.6 F (36.4 C), temperature source Oral, resp. rate 20, height 5\' 4"  (1.626 m), weight 61.2 kg, SpO2 91 %. PHYSICAL EXAMINATION:  GENERAL:  63 y.o.-year-old patient lying in the bed with no acute distress.  Well-appearing. EYES: Pupils equal, round, reactive to light and accommodation. No scleral icterus. Extraocular muscles intact.  HEENT: Head atraumatic, normocephalic. Oropharynx and nasopharynx clear.  NECK:  Supple, no jugular venous distention. No thyroid enlargement, no tenderness.  LUNGS: Normal breath sounds  bilaterally, no wheezing, rales,rhonchi or crepitation. No use of accessory muscles of respiration.  CARDIOVASCULAR: RRR, S1, S2 normal. No murmurs, rubs, or gallops.  ABDOMEN: Soft, non-tender, non-distended. Bowel sounds present. No organomegaly or mass.  EXTREMITIES: No pedal edema, cyanosis, or clubbing.  NEUROLOGIC: Cranial nerves II through XII are intact. Muscle strength 5/5 in all extremities. Sensation intact. Gait not checked.  PSYCHIATRIC: The patient is alert and oriented x 3.  SKIN: No obvious rash, lesion, or ulcer.  DATA REVIEW:   CBC Recent Labs  Lab 11/19/18 0542  WBC 8.0  HGB 12.3*  HCT 37.4*  PLT 313    Chemistries  Recent Labs  Lab 11/19/18 0542 11/19/18 1305  NA 130*  --   K 5.2* 4.1  CL 95*  --   CO2 23  --   GLUCOSE 556*  --   BUN 20  --   CREATININE 1.14  --   CALCIUM 9.0  --      Microbiology Results  Results for orders placed or performed during the hospital encounter of 11/18/18  SARS Coronavirus 2 (CEPHEID- Performed in [facility] hospital lab), Hosp Order     Status: None   Collection Time: 11/18/18  6:02 PM   Specimen: Nasopharyngeal Swab  Result Value Ref Range Status   SARS Coronavirus 2 NEGATIVE NEGATIVE Final    Comment: (NOTE) If result is NEGATIVE SARS-CoV-2 target nucleic acids are NOT DETECTED. The SARS-CoV-2 RNA is generally detectable in upper and lower  respiratory specimens during the acute phase of infection. The lowest  concentration of SARS-CoV-2 viral copies this assay can detect is 250  copies / mL. A negative result does not preclude SARS-CoV-2 infection  and should not be used as the sole basis for treatment or other  patient management decisions.  A negative result may occur with  improper specimen collection / handling, submission of specimen other  than nasopharyngeal swab, presence of viral mutation(s) within the  areas targeted by this assay, and inadequate number of viral copies  (<250 copies / mL). A  negative result must be combined with clinical  observations, patient history, and epidemiological information. If result is POSITIVE SARS-CoV-2 target nucleic acids are DETECTED. The SARS-CoV-2 RNA is generally detectable in upper and lower  respiratory specimens dur ing the acute phase of infection.  Positive  results are indicative of active infection with SARS-CoV-2.  Clinical  correlation with patient history and other diagnostic information is  necessary to determine patient infection status.  Positive results do  not rule out bacterial infection or co-infection with other viruses.  If result is PRESUMPTIVE POSTIVE SARS-CoV-2 nucleic acids MAY BE PRESENT.   A presumptive positive result was obtained on the submitted specimen  and confirmed on repeat testing.  While 2019 novel coronavirus  (SARS-CoV-2) nucleic acids may be present in the submitted sample  additional confirmatory testing may be necessary for epidemiological  and / or clinical management purposes  to differentiate between  SARS-CoV-2 and other Sarbecovirus currently known to infect humans.  If clinically indicated additional testing with an alternate test  methodology [phone]) is advised. The SARS-CoV-2 RNA is generally  detectable in upper and lower respiratory sp ecimens during the acute  phase of infection. The expected result is Negative. Fact Sheet for Patients:  StrictlyIdeas.no Fact Sheet for Healthcare Providers: BankingDealers.co.za This test is not yet approved or cleared by the [place] FDA and has been authorized for detection and/or diagnosis of SARS-CoV-2 by FDA under an Emergency Use Authorization (EUA).  This EUA will remain in effect (meaning this test can be used) for the duration of the COVID-19 declaration under Section 564(b)(1) of the Act, 21 U.S.C. section 360bbb-3(b)(1), unless the authorization is terminated or revoked sooner. Performed  at [facility], [address] [place], [place] [address]     RADIOLOGY:  Ct Chest Wo Contrast  Result Date: 11/18/2018 CLINICAL DATA:  Right chest pain since a fall due to a syncopal episode 11/16/2018. Initial encounter. EXAM: CT CHEST WITHOUT CONTRAST TECHNIQUE: Multidetector CT imaging of the chest was performed following the standard protocol without IV contrast. COMPARISON:  Plain film of the scratch the plain films of the chest and right ribs today. FINDINGS: Cardiovascular: Heart size is normal. Leads from pacing device are noted. There is calcific aortic and coronary atherosclerosis. No pericardial effusion Mediastinum/Nodes: No enlarged mediastinal or axillary lymph nodes. Thyroid gland, trachea, and esophagus demonstrate no significant findings. Lungs/Pleura: No pneumothorax. The lungs are emphysematous. Patchy area of airspace disease is seen in the left lung base. A nodule in the anterior right upper lobe measuring 0.4 cm on image 48, series 3 is identified. There is a second nodule measuring 1.1 cm x 0.8 cm in the right lower lobe on image 76 of series 3. Small area of mucous plugging in the left upper lobe is noted. There is a ground-glass attenuating nodule in the right upper lobe measuring 1.2 x 0.9 cm on image 41 of series 3. Upper Abdomen: The liver is low attenuating consistent with fatty infiltration. Status post cholecystectomy. Musculoskeletal: No fracture or focal bony lesion. IMPRESSION: Negative for fracture. Airspace disease in the left lower lobe could be atelectasis or pneumonia. Bilateral pulmonary nodules including a ground-glass attenuating nodule in the right upper lobe measuring 1 cm in diameter and a solid nodule in the right lower lobe measuring 1.1 cm in diameter. PET CT scan recommended for further evaluation. Extensive coronary artery atherosclerosis. Extensive aortic atherosclerosis (ICD10-I70.0) and emphysema (ICD10-J43.9). Electronically Signed   By:  Inge Rise M.D.   On: 11/18/2018 16:27     Management plans discussed with the patient, family and they are in agreement.  CODE STATUS: Full Code   TOTAL TIME TAKING CARE OF THIS PATIENT: 45 minutes.    Berna Spare Jacklynn Dehaas M.D on 11/19/2018 at 1:54 PM  Between 7am to 6pm - Pager [phone]  After 6pm go to [email or web address] - Proofreader  [facility]  Office  [phone]  CC: Primary care physician; Idelle Crouch, MD   Note: This dictation was  prepared with Dragon dictation along with smaller phrase technology. Any transcriptional errors that result from this process are unintentional.

## 2018-11-19 NOTE — Care Management Obs Status (Signed)
MEDICARE OBSERVATION STATUS NOTIFICATION   Patient Details  Name: John Frank MRN: [id number] Date of Birth: 1956-02-02   Medicare Observation Status Notification Given:  Yes    Ross Ludwig, LCSW 11/19/2018, 2:55 PM

## 2018-11-20 LAB — HIV ANTIBODY (ROUTINE TESTING W REFLEX): HIV Screen 4th Generation wRfx: NONREACTIVE

## 2018-11-22 ENCOUNTER — Telehealth: Payer: Self-pay | Admitting: Gastroenterology

## 2018-11-22 NOTE — Telephone Encounter (Signed)
Called pt to reschedule his f/u apt for 12/02/18 pt states he did not have his procedure for 11/16/18 because he was not cleaned out he states he would like to take a pill at [facility] or a suppository please call pt

## 2018-11-27 ENCOUNTER — Emergency Department
Admission: EM | Admit: 2018-11-27 | Discharge: 2018-11-27 | Discharge status: Against Medical Advice | Payer: Medicare Other

## 2018-11-29 NOTE — Telephone Encounter (Signed)
Called pt to reschedule EGD/Colonoscopy procedure.  Unable to contact LVM to return call

## 2018-11-30 NOTE — Telephone Encounter (Signed)
We can do the EGD for now

## 2018-12-01 ENCOUNTER — Telehealth: Payer: Self-pay | Admitting: Gastroenterology

## 2018-12-01 NOTE — Telephone Encounter (Signed)
Pt states he received a text from [place] ? Please call him

## 2018-12-01 NOTE — Telephone Encounter (Addendum)
Called pt to inform him of Dr. Georgeann Oppenheim suggestion for pt to have upper endoscopy only.   Unable to contact, LVM to return call

## 2018-12-02 ENCOUNTER — Other Ambulatory Visit: Payer: Self-pay

## 2018-12-02 ENCOUNTER — Ambulatory Visit: Payer: Medicare HMO | Admitting: Gastroenterology

## 2018-12-02 DIAGNOSIS — K2970 Gastritis, unspecified, without bleeding: Secondary | ICD-10-CM

## 2018-12-02 NOTE — Telephone Encounter (Signed)
Pt left vm returning a call

## 2018-12-02 NOTE — Telephone Encounter (Signed)
Returned pt call regarding the voicemail I left pt in reference to rescheduling his endoscopy procedure, his issues with the prep and Dr. Georgeann Oppenheim instructions for pt to have upper endoscopy only.  Unable to contact, LVM to return call

## 2018-12-02 NOTE — Telephone Encounter (Signed)
Spoke with pt and pt emergency contact informed him that Dr. Vicente Males suggests an upper endoscopy only. Pt agrees. We were able to schedule pt procedure. Pt is aware of when to go for his COVID test, I also reminded pt to hold his blood thinner (Plavix) 5 days prior.

## 2018-12-02 NOTE — Telephone Encounter (Signed)
Spoke with pt. (See 11-22-18 phone encounter)

## 2018-12-06 ENCOUNTER — Other Ambulatory Visit: Admission: RE | Admit: 2018-12-06 | Payer: Medicare Other | Source: Ambulatory Visit

## 2018-12-08 ENCOUNTER — Encounter
Admission: RE | Admit: 2018-12-08 | Discharge: 2018-12-08 | Disposition: A | Discharge status: Home / Self Care | Payer: Medicare Other | Source: Ambulatory Visit | Attending: Gastroenterology | Admitting: Gastroenterology

## 2018-12-08 ENCOUNTER — Other Ambulatory Visit: Payer: Self-pay

## 2018-12-08 DIAGNOSIS — Z20828 Contact with and (suspected) exposure to other viral communicable diseases: Secondary | ICD-10-CM | POA: Diagnosis present

## 2018-12-08 LAB — SARS CORONAVIRUS 2 (TAT 6-24 HRS): SARS Coronavirus 2: NEGATIVE

## 2018-12-09 ENCOUNTER — Other Ambulatory Visit: Payer: Medicare Other

## 2018-12-13 ENCOUNTER — Ambulatory Visit: Payer: Medicare Other | Admitting: Anesthesiology

## 2018-12-13 ENCOUNTER — Encounter: Payer: Self-pay | Admitting: Anesthesiology

## 2018-12-13 ENCOUNTER — Encounter
Admission: RE | Disposition: A | Discharge status: Home / Self Care | Payer: Self-pay | Source: Home / Self Care | Attending: Gastroenterology

## 2018-12-13 ENCOUNTER — Other Ambulatory Visit: Payer: Self-pay

## 2018-12-13 ENCOUNTER — Ambulatory Visit
Admission: RE | Admit: 2018-12-13 | Discharge: 2018-12-13 | Disposition: A | Discharge status: Home / Self Care | Payer: Medicare Other | Attending: Gastroenterology | Admitting: Gastroenterology

## 2018-12-13 DIAGNOSIS — G4730 Sleep apnea, unspecified: Secondary | ICD-10-CM | POA: Diagnosis not present

## 2018-12-13 DIAGNOSIS — Z79899 Other long term (current) drug therapy: Secondary | ICD-10-CM | POA: Diagnosis not present

## 2018-12-13 DIAGNOSIS — Z950 Presence of cardiac pacemaker: Secondary | ICD-10-CM | POA: Diagnosis not present

## 2018-12-13 DIAGNOSIS — Z7982 Long term (current) use of aspirin: Secondary | ICD-10-CM | POA: Diagnosis not present

## 2018-12-13 DIAGNOSIS — E119 Type 2 diabetes mellitus without complications: Secondary | ICD-10-CM | POA: Diagnosis not present

## 2018-12-13 DIAGNOSIS — K2210 Ulcer of esophagus without bleeding: Secondary | ICD-10-CM | POA: Diagnosis not present

## 2018-12-13 DIAGNOSIS — K2950 Unspecified chronic gastritis without bleeding: Secondary | ICD-10-CM | POA: Insufficient documentation

## 2018-12-13 DIAGNOSIS — K219 Gastro-esophageal reflux disease without esophagitis: Secondary | ICD-10-CM | POA: Insufficient documentation

## 2018-12-13 DIAGNOSIS — F17210 Nicotine dependence, cigarettes, uncomplicated: Secondary | ICD-10-CM | POA: Insufficient documentation

## 2018-12-13 DIAGNOSIS — K3189 Other diseases of stomach and duodenum: Secondary | ICD-10-CM

## 2018-12-13 DIAGNOSIS — I5022 Chronic systolic (congestive) heart failure: Secondary | ICD-10-CM | POA: Diagnosis not present

## 2018-12-13 DIAGNOSIS — E785 Hyperlipidemia, unspecified: Secondary | ICD-10-CM | POA: Diagnosis not present

## 2018-12-13 DIAGNOSIS — I110 Hypertensive heart disease with heart failure: Secondary | ICD-10-CM | POA: Insufficient documentation

## 2018-12-13 DIAGNOSIS — K2970 Gastritis, unspecified, without bleeding: Secondary | ICD-10-CM

## 2018-12-13 DIAGNOSIS — Z7951 Long term (current) use of inhaled steroids: Secondary | ICD-10-CM | POA: Insufficient documentation

## 2018-12-13 DIAGNOSIS — Z7902 Long term (current) use of antithrombotics/antiplatelets: Secondary | ICD-10-CM | POA: Insufficient documentation

## 2018-12-13 DIAGNOSIS — Z955 Presence of coronary angioplasty implant and graft: Secondary | ICD-10-CM | POA: Insufficient documentation

## 2018-12-13 DIAGNOSIS — I252 Old myocardial infarction: Secondary | ICD-10-CM | POA: Diagnosis not present

## 2018-12-13 DIAGNOSIS — J449 Chronic obstructive pulmonary disease, unspecified: Secondary | ICD-10-CM | POA: Insufficient documentation

## 2018-12-13 DIAGNOSIS — K7460 Unspecified cirrhosis of liver: Secondary | ICD-10-CM | POA: Insufficient documentation

## 2018-12-13 DIAGNOSIS — Z09 Encounter for follow-up examination after completed treatment for conditions other than malignant neoplasm: Secondary | ICD-10-CM | POA: Diagnosis present

## 2018-12-13 DIAGNOSIS — Z7984 Long term (current) use of oral hypoglycemic drugs: Secondary | ICD-10-CM | POA: Insufficient documentation

## 2018-12-13 DIAGNOSIS — I2510 Atherosclerotic heart disease of native coronary artery without angina pectoris: Secondary | ICD-10-CM | POA: Insufficient documentation

## 2018-12-13 DIAGNOSIS — K31A0 Gastric intestinal metaplasia, unspecified: Secondary | ICD-10-CM

## 2018-12-13 HISTORY — PX: ESOPHAGOGASTRODUODENOSCOPY (EGD) WITH PROPOFOL: SHX5813

## 2018-12-13 LAB — GLUCOSE, CAPILLARY: Glucose-Capillary: 168 mg/dL — ABNORMAL HIGH (ref 70–99)

## 2018-12-13 SURGERY — ESOPHAGOGASTRODUODENOSCOPY (EGD) WITH PROPOFOL
Anesthesia: General

## 2018-12-13 MED ORDER — PROPOFOL 500 MG/50ML IV EMUL
INTRAVENOUS | Status: AC
  Filled 2018-12-13: qty 50

## 2018-12-13 MED ORDER — FENTANYL CITRATE (PF) 100 MCG/2ML IJ SOLN
INTRAMUSCULAR | Status: DC | PRN
  Administered 2018-12-13 (×2): 25 ug via INTRAVENOUS

## 2018-12-13 MED ORDER — PROPOFOL 10 MG/ML IV BOLUS
INTRAVENOUS | Status: DC | PRN
  Administered 2018-12-13: 70 mg via INTRAVENOUS

## 2018-12-13 MED ORDER — PHENYLEPHRINE HCL (PRESSORS) 10 MG/ML IV SOLN
INTRAVENOUS | Status: DC | PRN
  Administered 2018-12-13 (×2): 100 ug via INTRAVENOUS

## 2018-12-13 MED ORDER — GLYCOPYRROLATE 0.2 MG/ML IJ SOLN
INTRAMUSCULAR | Status: DC | PRN
  Administered 2018-12-13: 0.2 mg via INTRAVENOUS

## 2018-12-13 MED ORDER — PROPOFOL 500 MG/50ML IV EMUL
INTRAVENOUS | Status: DC | PRN
  Administered 2018-12-13: 140 ug/kg/min via INTRAVENOUS

## 2018-12-13 MED ORDER — LIDOCAINE HCL (PF) 1 % IJ SOLN
INTRAMUSCULAR | Status: AC
  Filled 2018-12-13: qty 2

## 2018-12-13 MED ORDER — LIDOCAINE HCL (CARDIAC) PF 100 MG/5ML IV SOSY
PREFILLED_SYRINGE | INTRAVENOUS | Status: DC | PRN
  Administered 2018-12-13: 60 mg via INTRAVENOUS

## 2018-12-13 MED ORDER — FENTANYL CITRATE (PF) 100 MCG/2ML IJ SOLN
INTRAMUSCULAR | Status: AC
  Filled 2018-12-13: qty 2

## 2018-12-13 MED ORDER — SODIUM CHLORIDE 0.9 % IV SOLN
INTRAVENOUS | Status: DC
  Administered 2018-12-13: 1000 mL via INTRAVENOUS

## 2018-12-13 MED ORDER — PANTOPRAZOLE SODIUM 40 MG PO TBEC: 40.0000 mg | DELAYED_RELEASE_TABLET | Freq: Two times a day (BID) | ORAL | 1 refills | Status: DC

## 2018-12-13 NOTE — Anesthesia Preprocedure Evaluation (Signed)
Anesthesia Evaluation  Patient identified by MRN, date of birth, ID band Patient awake    Reviewed: Allergy & Precautions, NPO status , Patient's Chart, lab work & pertinent test results, reviewed documented beta blocker date and time   Airway Mallampati: III  TM Distance: >3 FB     Dental  (+) Chipped   Pulmonary shortness of breath, sleep apnea , COPD, Current Smoker,           Cardiovascular hypertension, Pt. on medications and Pt. on home beta blockers + CAD, + Past MI, + Cardiac Stents and +CHF  + pacemaker + Cardiac Defibrillator      Neuro/Psych    GI/Hepatic GERD  ,(+) Cirrhosis       ,   Endo/Other  diabetes, Type 2  Renal/GU      Musculoskeletal   Abdominal   Peds  Hematology  (+) anemia ,   Anesthesia Other Findings Smokes.  Reproductive/Obstetrics                             Anesthesia Physical  Anesthesia Plan  ASA: III  Anesthesia Plan: General   Post-op Pain Management:    Induction: Intravenous  PONV Risk Score and Plan: Propofol infusion  Airway Management Planned: Nasal Cannula  Additional Equipment:   Intra-op Plan:   Post-operative Plan:   Informed Consent: I have reviewed the patients History and Physical, chart, labs and discussed the procedure including the risks, benefits and alternatives for the proposed anesthesia with the patient or authorized representative who has indicated his/her understanding and acceptance.       Plan Discussed with: CRNA  Anesthesia Plan Comments:         Anesthesia Quick Evaluation

## 2018-12-13 NOTE — H&P (Signed)
Cephas Darby, MD [address]  [place]  [place], [place] [address]  Main: [phone]  Fax: [phone] Pager: [phone]  Primary Care Physician:  Idelle Crouch, MD Primary Gastroenterologist:  Dr. Cephas Darby  Pre-Procedure History & Physical: HPI:  John Frank is a 62 y.o. male is here for an endoscopy.   Past Medical History:  Diagnosis Date  . CAD (coronary artery disease)    s/p stents in 2017  . Chronic systolic CHF (congestive heart failure) ([place])   . COPD (chronic obstructive pulmonary disease) ([place])   . Diabetes ([place])   . GERD (gastroesophageal reflux disease)   . Heart attack ([place])   . Hyperlipidemia   . Hypertension   . Presence of permanent cardiac pacemaker    and defibrillator  . Shortness of breath dyspnea   . Sleep apnea     Past Surgical History:  Procedure Laterality Date  . cardiac stents    . CARDIAC SURGERY     defibrillator and pacemaker  . CORONARY ANGIOPLASTY    . ESOPHAGOGASTRODUODENOSCOPY N/A 09/19/2014   Procedure: ESOPHAGOGASTRODUODENOSCOPY (EGD);  Surgeon: Lucilla Lame, MD;  Location: [facility] ENDOSCOPY;  Service: Endoscopy;  Laterality: N/A;  . ESOPHAGOGASTRODUODENOSCOPY (EGD) WITH PROPOFOL N/A 09/28/2017   Procedure: ESOPHAGOGASTRODUODENOSCOPY (EGD) WITH PROPOFOL;  Surgeon: Virgel Manifold, MD;  Location: [facility] ENDOSCOPY;  Service: Endoscopy;  Laterality: N/A;  . ESOPHAGOGASTRODUODENOSCOPY (EGD) WITH PROPOFOL N/A 04/19/2018   Procedure: ESOPHAGOGASTRODUODENOSCOPY (EGD) WITH PROPOFOL with Gastric Mapping;  Surgeon: [name], MD;  Location: [facility] ENDOSCOPY;  Service: Gastroenterology;  Laterality: N/A;    Prior to Admission medications   Medication Sig Start Date End Date Taking? Authorizing Provider  albuterol (PROVENTIL HFA;VENTOLIN HFA) 108 (90 Base) MCG/ACT inhaler Inhale 2 puffs into the lungs every 6 (six) hours as needed for wheezing or shortness of breath. 05/26/17  Yes Henreitta Leber, MD  aspirin EC 81 MG  tablet Take 81 mg by mouth daily.   Yes [provider]  atorvastatin (LIPITOR) 40 MG tablet TAKE 1 TABLET (40 MG TOTAL) BY MOUTH DAILY. 03/08/18  Yes Cannady, Jolene T, NP  azithromycin (ZITHROMAX) 500 MG tablet Take 1 tablet (500 mg total) by mouth daily. 11/20/18  Yes Mayo, Pete Pelt, MD  budesonide-formoterol [facility]) 160-4.5 MCG/ACT inhaler Inhale 2 puffs into the lungs 2 (two) times daily. 06/15/15  Yes Kathrine Haddock, NP  clopidogrel (PLAVIX) 75 MG tablet TAKE 1 TABLET (75 MG TOTAL) BY MOUTH DAILY. 12/08/16  Yes Kathrine Haddock, NP  famotidine (PEPCID) 40 MG tablet TAKE 1 TABLET BY MOUTH EVERY DAY 09/09/17  Yes Kathrine Haddock, NP  furosemide (LASIX) 40 MG tablet Take 1 tablet (40 mg total) by mouth daily. Patient taking differently: Take 40 mg by mouth 2 (two) times daily.  08/11/17  Yes Wieting, Richard, MD  ipratropium-albuterol (DUONEB) 0.5-2.5 (3) MG/3ML SOLN Take 3 mLs by nebulization every 6 (six) hours as needed (wheezing; shortness of breath).   Yes [provider]  JANUVIA 100 MG tablet TAKE 1 TABLET (100 MG TOTAL) BY MOUTH DAILY. 09/14/17  Yes Kathrine Haddock, NP  levocetirizine (XYZAL) 5 MG tablet Take 5 mg by mouth daily. 11/10/18  Yes [provider]  metFORMIN (GLUCOPHAGE) 1000 MG tablet TAKE 1 TABLET (1,000 MG TOTAL) BY MOUTH 2 (TWO) TIMES DAILY WITH A MEAL. 09/12/16  Yes Kathrine Haddock, NP  metoprolol succinate (TOPROL-XL) 100 MG 24 hr tablet Take 100 mg by mouth daily.  08/19/17  Yes [provider]  pantoprazole (  PROTONIX) 40 MG tablet Take 1 tablet (40 mg total) by mouth every morning. 06/29/17  Yes [name], MD  predniSONE (DELTASONE) 50 MG tablet Take 1 tablet (50 mg total) by mouth daily with breakfast. 11/20/18  Yes Mayo, Pete Pelt, MD  sacubitril-valsartan (ENTRESTO) 49-51 MG Take 1 tablet by mouth 2 (two) times daily. 06/14/18  Yes Darylene Price A, FNP  spironolactone (ALDACTONE) 25 MG tablet TAKE 1/2 TABLET BY MOUTH EVERY DAY 10/04/18  Yes Darylene Price A, FNP  buPROPion (WELLBUTRIN) 75 MG tablet Take 75 mg by mouth daily. 03/26/16 10/19/18  [provider]  ONE TOUCH ULTRA TEST test strip USE TO TEST BLOOD SUGAR ONCE DAILY 10/07/16   Wynetta Emery, Megan P, DO  sucralfate (CARAFATE) 1 g tablet Take 1 g by mouth 4 (four) times daily -  with meals and at bedtime.  05/15/17 10/19/18  [provider]    Allergies as of 12/02/2018  . (No Known Allergies)    Family History  Problem Relation Age of Onset  . Diabetes Mother   . Heart attack Father 31    Social History   Socioeconomic History  . Marital status: Single    Spouse name: Not on file  . Number of children: Not on file  . Years of education: Not on file  . Highest education level: Not on file  Occupational History  . Not on file  Social Needs  . Financial resource strain: Not on file  . Food insecurity    Worry: Not on file    Inability: Not on file  . Transportation needs    Medical: Not on file    Non-medical: Not on file  Tobacco Use  . Smoking status: Current Every Day Smoker    Packs/day: 0.25    Years: 40.00    Pack years: 10.00    Types: Cigarettes  . Smokeless tobacco: Former Network engineer and Sexual Activity  . Alcohol use: No  . Drug use: Never  . Sexual activity: Yes  Lifestyle  . Physical activity    Days per week: Not on file    Minutes per session: Not on file  . Stress: Not on file  Relationships  . Social Herbalist on phone: Not on file    Gets together: Not on file    Attends religious service: Not on file    Active member of club or organization: Not on file    Attends meetings of clubs or organizations: Not on file    Relationship status: Not on file  . Intimate partner violence    Fear of current or ex partner: Not on file    Emotionally abused: Not on file    Physically abused: Not on file    Forced sexual activity: Not on file  Other Topics Concern  . Not on file  Social History Narrative   Independent  at baseline.  Lives at home with his family    Review of Systems: See HPI, otherwise negative ROS  Physical Exam: BP 106/75   Pulse 77   Temp (!) 96.9 F (36.1 C) (Tympanic)   Resp 20   Ht 5\' 4"  (1.626 m)   Wt 70.3 kg   SpO2 100%   BMI 26.61 kg/m  General:   Alert,  pleasant and cooperative in NAD Head:  Normocephalic and atraumatic. Neck:  Supple; no masses or thyromegaly. Lungs:  Clear throughout to auscultation.    Heart:  Regular rate and  rhythm. Abdomen:  Soft, nontender and nondistended. Normal bowel sounds, without guarding, and without rebound.   Neurologic:  Alert and  oriented x4;  grossly normal neurologically.  Impression/Plan: John Frank is here for an endoscopy to be performed for follow up of intestinal metaplasia  Risks, benefits, limitations, and alternatives regarding  endoscopy have been reviewed with the patient.  Questions have been answered.  All parties agreeable.   Sherri Sear, MD  12/13/2018, 10:27 AM

## 2018-12-13 NOTE — Op Note (Addendum)
[facility] Gastroenterology Patient Name: John Frank Procedure Date: 12/13/2018 10:33 AM MRN: [id number] Account #: [id number] Date of Birth: 09-07-1955 Admit Type: Outpatient Age: 63 Room: [facility] ENDO ROOM 4 Gender: Male Note Status: Finalized Procedure:            Upper GI endoscopy Indications:          Gastric mapping for intestinal metaplasia Providers:            Lin Landsman MD, MD Referring MD:         Leonie Douglas. Doy Hutching, MD (Referring MD) Medicines:            Monitored Anesthesia Care Complications:        No immediate complications. Estimated blood loss: None. Procedure:            Pre-Anesthesia Assessment:                       - Prior to the procedure, a History and Physical was                        performed, and patient medications and allergies were                        reviewed. The patient is competent. The risks and                        benefits of the procedure and the sedation options and                        risks were discussed with the patient. All questions                        were answered and informed consent was obtained.                        Patient identification and proposed procedure were                        verified by the physician, the nurse, the                        anesthesiologist, the anesthetist and the technician in                        the pre-procedure area in the procedure room in the                        endoscopy suite. Mental Status Examination: alert and                        oriented. Airway Examination: normal oropharyngeal                        airway and neck mobility. Respiratory Examination:                        clear to auscultation. CV Examination: normal.                        Prophylactic Antibiotics: The patient does not require  prophylactic antibiotics. Prior Anticoagulants: The                        patient has taken Plavix (clopidogrel), last dose  was 5                        days prior to procedure. ASA Grade Assessment: III - A                        patient with severe systemic disease. After reviewing                        the risks and benefits, the patient was deemed in                        satisfactory condition to undergo the procedure. The                        anesthesia plan was to use monitored anesthesia care                        (MAC). Immediately prior to administration of                        medications, the patient was re-assessed for adequacy                        to receive sedatives. The heart rate, respiratory rate,                        oxygen saturations, blood pressure, adequacy of                        pulmonary ventilation, and response to care were                        monitored throughout the procedure. The physical status                        of the patient was re-assessed after the procedure.                       After obtaining informed consent, the endoscope was                        passed under direct vision. Throughout the procedure,                        the patient's blood pressure, pulse, and oxygen                        saturations were monitored continuously. The Endoscope                        was introduced through the mouth, and advanced to the                        second part of duodenum. The upper GI endoscopy was  accomplished without difficulty. The patient tolerated                        the procedure well. Findings:      The duodenal bulb and second portion of the duodenum were normal.      The entire examined stomach was normal. Biopsies were taken with a cold       forceps for histology.      The cardia and gastric fundus were normal on retroflexion.      One superficial esophageal ulcer with no bleeding and no stigmata of       recent bleeding was found at the gastroesophageal junction. The lesion       was 5 mm in largest  dimension. Impression:           - Normal duodenal bulb and second portion of the                        duodenum.                       - Normal stomach. Biopsied.                       - Non-bleeding esophageal ulcer. Recommendation:       - Await pathology results.                       - Repeat upper endoscopy in 2 months to check healing.                       - Return to GI clinic as previously scheduled.                       - Use a proton pump inhibitor PO BID for 3 months.                       - Resume Plavix (clopidogrel) tomorrow at prior dose.                        Refer to primary physician for further adjustment of                        therapy. Procedure Code(s):    --- Professional ---                       [phone], Esophagogastroduodenoscopy, flexible, transoral;                        with biopsy, single or multiple Diagnosis Code(s):    --- Professional ---                       K22.10, Ulcer of esophagus without bleeding CPT copyright 2019 American Medical Association. All rights reserved. The codes documented in this report are preliminary and upon coder review may  be revised to meet current compliance requirements. Dr. Ulyess Mort Lin Landsman MD, MD 12/13/2018 10:55:06 AM This report has been signed electronically. Number of Addenda: 0 Note Initiated On: 12/13/2018 10:33 AM Estimated Blood Loss: Estimated blood loss: none.      [facility]

## 2018-12-13 NOTE — Transfer of Care (Signed)
Immediate Anesthesia Transfer of Care Note  Patient: John Frank  Procedure(s) Performed: ESOPHAGOGASTRODUODENOSCOPY (EGD) WITH PROPOFOL (N/A )  Patient Location: PACU  Anesthesia Type:General  Level of Consciousness: sedated  Airway & Oxygen Therapy: Patient Spontanous Breathing and Patient connected to nasal cannula oxygen  Post-op Assessment: Report given to RN and Post -op Vital signs reviewed and stable  Post vital signs: Reviewed and stable  Last Vitals:  Vitals Value Taken Time  BP 112/80 12/13/18 1054  Temp 36.6 C 12/13/18 1054  Pulse 73 12/13/18 1054  Resp 14 12/13/18 1054  SpO2 100 % 12/13/18 1054  Vitals shown include unvalidated device data.  Last Pain:  Vitals:   12/13/18 1054  TempSrc:   PainSc: 0-No pain         Complications: No apparent anesthesia complications

## 2018-12-13 NOTE — Anesthesia Postprocedure Evaluation (Signed)
Anesthesia Post Note  Patient: KINDRED REIDINGER  Procedure(s) Performed: ESOPHAGOGASTRODUODENOSCOPY (EGD) WITH PROPOFOL (N/A )  Patient location during evaluation: Endoscopy Anesthesia Type: General Level of consciousness: awake and alert and oriented Pain management: pain level controlled Vital Signs Assessment: post-procedure vital signs reviewed and stable Respiratory status: spontaneous breathing Cardiovascular status: blood pressure returned to baseline Anesthetic complications: no     Last Vitals:  Vitals:   12/13/18 1053 12/13/18 1054  BP: 112/80 112/80  Pulse:  81  Resp:  14  Temp:  36.6 C  SpO2:  97%    Last Pain:  Vitals:   12/13/18 1054  TempSrc:   PainSc: 0-No pain                 Lua Feng

## 2018-12-13 NOTE — Anesthesia Post-op Follow-up Note (Signed)
Anesthesia QCDR form completed.        

## 2018-12-14 ENCOUNTER — Encounter: Payer: Self-pay | Admitting: Gastroenterology

## 2018-12-14 LAB — SURGICAL PATHOLOGY

## 2018-12-21 ENCOUNTER — Telehealth: Payer: Self-pay

## 2018-12-21 NOTE — Telephone Encounter (Signed)
-----   Message from Lin Landsman, MD sent at 12/16/2018  4:31 PM EDT ----- His gastric biopsies did not reveal any evidence of intestinal metaplasia.  He does not need any follow-up endoscopy at this time.   Thanks RV

## 2018-12-21 NOTE — Telephone Encounter (Signed)
Spoke with pt and informed him of biopsy results.

## 2018-12-27 NOTE — Progress Notes (Signed)
Patient ID: John Frank, male    DOB: 1955/11/13, 63 y.o.   MRN: [id number]  HPI  Mr Mclees is a 63 y/o male with a history of CAD, DM, hyperlipidemia, HTN, GERD, COPD, obstructive sleep apnea, chronic heart failure and current tobacco use.   Echo report from 11/19/2018 reviewed and showed an EF of 40-45%. Echo report from 04/13/17 reviewed and showed an EF of 30% along with mild MR/TR.  Was in the ED 11/27/2018 but left without being seen. Admitted 11/18/2018 due to COPD exacerbation. Initially needed oxygen therapy but was able to get off of that. Given antibiotics and steroids. Discharged the following day.    He presents today for a follow-up visit with a chief complaint of feeling "off balance" at times which has resulted in falling. He has associated cough along with this. Denies any difficulty sleeping, dizziness, abdominal distention, palpitations, pedal edema, chest pain, shortness of breath or fatigue. Needs new scales.   Past Medical History:  Diagnosis Date  . CAD (coronary artery disease)    s/p stents in 2017  . Chronic systolic CHF (congestive heart failure) ([place])   . COPD (chronic obstructive pulmonary disease) ([place])   . Diabetes ([place])   . GERD (gastroesophageal reflux disease)   . Heart attack ([place])   . Hyperlipidemia   . Hypertension   . Presence of permanent cardiac pacemaker    and defibrillator  . Shortness of breath dyspnea   . Sleep apnea    Past Surgical History:  Procedure Laterality Date  . cardiac stents    . CARDIAC SURGERY     defibrillator and pacemaker  . CORONARY ANGIOPLASTY    . ESOPHAGOGASTRODUODENOSCOPY N/A 09/19/2014   Procedure: ESOPHAGOGASTRODUODENOSCOPY (EGD);  Surgeon: Lucilla Lame, MD;  Location: [facility] ENDOSCOPY;  Service: Endoscopy;  Laterality: N/A;  . ESOPHAGOGASTRODUODENOSCOPY (EGD) WITH PROPOFOL N/A 09/28/2017   Procedure: ESOPHAGOGASTRODUODENOSCOPY (EGD) WITH PROPOFOL;  Surgeon: Virgel Manifold, MD;  Location: [facility] ENDOSCOPY;   Service: Endoscopy;  Laterality: N/A;  . ESOPHAGOGASTRODUODENOSCOPY (EGD) WITH PROPOFOL N/A 04/19/2018   Procedure: ESOPHAGOGASTRODUODENOSCOPY (EGD) WITH PROPOFOL with Gastric Mapping;  Surgeon: [name], MD;  Location: [facility] ENDOSCOPY;  Service: Gastroenterology;  Laterality: N/A;  . ESOPHAGOGASTRODUODENOSCOPY (EGD) WITH PROPOFOL N/A 12/13/2018   Procedure: ESOPHAGOGASTRODUODENOSCOPY (EGD) WITH PROPOFOL;  Surgeon: [name], MD;  Location: [facility] ENDOSCOPY;  Service: Gastroenterology;  Laterality: N/A;   Family History  Problem Relation Age of Onset  . Diabetes Mother   . Heart attack Father 31   Social History   Tobacco Use  . Smoking status: Current Every Day Smoker    Packs/day: 0.25    Years: 40.00    Pack years: 10.00    Types: Cigarettes  . Smokeless tobacco: Former Network engineer Use Topics  . Alcohol use: No   No Known Allergies  Prior to Admission medications   Medication Sig Start Date End Date Taking? Authorizing Provider  albuterol (PROVENTIL HFA;VENTOLIN HFA) 108 (90 Base) MCG/ACT inhaler Inhale 2 puffs into the lungs every 6 (six) hours as needed for wheezing or shortness of breath. 05/26/17  Yes Henreitta Leber, MD  aspirin EC 81 MG tablet Take 81 mg by mouth daily.   Yes [provider]  atorvastatin (LIPITOR) 40 MG tablet TAKE 1 TABLET (40 MG TOTAL) BY MOUTH DAILY. 03/08/18  Yes Cannady, Jolene T, NP  azithromycin (ZITHROMAX) 500 MG tablet Take 1 tablet (500 mg total) by mouth daily. 11/20/18  Yes Mayo, Pete Pelt, MD  budesonide-formoterol (  SYMBICORT) 160-4.5 MCG/ACT inhaler Inhale 2 puffs into the lungs 2 (two) times daily. 06/15/15  Yes Kathrine Haddock, NP  buPROPion (WELLBUTRIN) 75 MG tablet Take 75 mg by mouth daily. 03/26/16 12/28/18 Yes [provider]  clopidogrel (PLAVIX) 75 MG tablet TAKE 1 TABLET (75 MG TOTAL) BY MOUTH DAILY. 12/08/16  Yes Kathrine Haddock, NP  famotidine (PEPCID) 40 MG tablet TAKE 1 TABLET BY MOUTH EVERY DAY 09/09/17  Yes Kathrine Haddock, NP  furosemide (LASIX) 40 MG tablet Take 1 tablet (40 mg total) by mouth daily. Patient taking differently: Take 40 mg by mouth 2 (two) times daily.  08/11/17  Yes Wieting, Richard, MD  glimepiride (AMARYL) 4 MG tablet Take 4 mg by mouth daily with breakfast. 12/01/18  Yes [provider]  ipratropium-albuterol (DUONEB) 0.5-2.5 (3) MG/3ML SOLN Take 3 mLs by nebulization every 6 (six) hours as needed (wheezing; shortness of breath).   Yes [provider]  JANUVIA 100 MG tablet TAKE 1 TABLET (100 MG TOTAL) BY MOUTH DAILY. 09/14/17  Yes Kathrine Haddock, NP  levocetirizine (XYZAL) 5 MG tablet Take 5 mg by mouth daily. 11/10/18  Yes [provider]  metFORMIN (GLUCOPHAGE) 1000 MG tablet TAKE 1 TABLET (1,000 MG TOTAL) BY MOUTH 2 (TWO) TIMES DAILY WITH A MEAL. 09/12/16  Yes Kathrine Haddock, NP  metoprolol succinate (TOPROL-XL) 100 MG 24 hr tablet Take 100 mg by mouth daily.  08/19/17  Yes [provider]  ONE TOUCH ULTRA TEST test strip USE TO TEST BLOOD SUGAR ONCE DAILY 10/07/16  Yes Johnson, Megan P, DO  pantoprazole (PROTONIX) 40 MG tablet Take 1 tablet (40 mg total) by mouth every morning. 06/29/17  Yes [name], MD  pantoprazole (PROTONIX) 40 MG tablet Take 1 tablet (40 mg total) by mouth 2 (two) times daily before a meal. 12/13/18 03/13/19 Yes Vanga, Tally Due, MD  predniSONE (DELTASONE) 50 MG tablet Take 1 tablet (50 mg total) by mouth daily with breakfast. 11/20/18  Yes Mayo, Pete Pelt, MD  sacubitril-valsartan (ENTRESTO) 49-51 MG Take 1 tablet by mouth 2 (two) times daily. 06/14/18  Yes Darylene Price A, FNP  spironolactone (ALDACTONE) 25 MG tablet TAKE 1/2 TABLET BY MOUTH EVERY DAY 10/04/18  Yes Darylene Price A, FNP  sucralfate (CARAFATE) 1 g tablet Take 1 g by mouth 4 (four) times daily -  with meals and at bedtime.  05/15/17 12/28/18 Yes [provider]    Review of Systems  Constitutional: Negative for appetite change, fatigue and fever.  HENT: Negative for  congestion, postnasal drip and sore throat.   Eyes: Negative.   Respiratory: Positive for cough. Negative for chest tightness, shortness of breath and wheezing.   Cardiovascular: Negative for chest pain, palpitations and leg swelling.  Gastrointestinal: Negative for abdominal distention and abdominal pain.  Endocrine: Negative.   Genitourinary: Negative.   Musculoskeletal: Negative for back pain and neck pain.  Skin: Negative.   Allergic/Immunologic: Negative.   Neurological: Negative for dizziness, light-headedness and headaches.       Feel off balance  Hematological: Negative for adenopathy. Does not bruise/bleed easily.  Psychiatric/Behavioral: Negative for dysphoric mood and sleep disturbance (wearing CPAP ). The patient is not nervous/anxious.    Vitals:   12/28/18 0910  BP: 107/70  Pulse: 91  Resp: 18  SpO2: 97%  Weight: 154 lb 8 oz (70.1 kg)  Height: 5\' 4"  (1.626 m)   Wt Readings from Last 3 Encounters:  12/28/18 154 lb 8 oz (70.1 kg)  12/13/18 155 lb (  70.3 kg)  11/18/18 134 lb 14.7 oz (61.2 kg)   Lab Results  Component Value Date   CREATININE 1.14 11/19/2018   CREATININE 0.86 11/18/2018   CREATININE 0.90 10/19/2018    Physical Exam  Constitutional: He is oriented to person, place, and time. He appears well-developed and well-nourished.  HENT:  Head: Normocephalic and atraumatic.  Neck: Normal range of motion. Neck supple. No JVD present.  Cardiovascular: Normal rate and regular rhythm.  Pulmonary/Chest: Effort normal. He has wheezes in the right upper field, the right lower field, the left upper field and the left lower field. He has no rhonchi. He has no rales.  Abdominal: Soft. He exhibits no distension. There is no abdominal tenderness.  Musculoskeletal:        General: No tenderness or edema.  Neurological: He is alert and oriented to person, place, and time.  Skin: Skin is warm and dry.  Psychiatric: He has a normal mood and affect. His behavior is normal.  Thought content normal.  Nursing note and vitals reviewed.  Assessment & Plan:  1: Chronic heart failure with reduced ejection fraction- - NYHA class I - euvolemic today - not weighing daily as he needs some scales; set of scales given to him and he was reminded to call for an overnight weight gain of >2 pounds or a weekly weight gain of >5 pounds.  - weight down 5 pounds from last visit here 2 months ago - not adding salt; using Mrs. Dash; admits that he's not reading food labels; reminded him to keep his daily sodium intake to 2000mg  a day. Does eat quite a bit of processed food; rinses canned vegetables out - saw cardiology Clayborn Bigness) 08/25/2018 - saw pulmonologist Raul Del) 11/11/2018 - BNP 08/09/17 was 756.0 - currently has pacemaker/defibrillator  - current BP does not all for titrating up metoprolol / entresto  2: HTN- - BP looks good although on the low side - saw PCP Doy Hutching) 12/27/2018 - BMP 11/19/2018 reviewed and showed sodium 130, potassium 5.2/4.1, creatinine 1.14 and GFR >60  3: Diabetes- - 11/19/2018 glucose was 556 - home glucose this morning was 224 - A1c 05/25/2018 was 7.2%  4: Tobacco use- - currently smoking 1/2 pack cigarettes daily  - complete cessation discussed for 3 minutes with patient  5: Ataxia- - patient says that he gets "off balance" - says that his PCP is referring him to neurology for work-up - does have home health coming once/ week  Patient did not bring his medications nor a list. Each medication was verbally reviewed with the patient and he was encouraged to bring the bottles to every visit to confirm accuracy of list.  Return in 6 months or sooner for any questions/problems before then.

## 2018-12-28 ENCOUNTER — Encounter: Payer: Self-pay | Admitting: Family

## 2018-12-28 ENCOUNTER — Other Ambulatory Visit: Payer: Self-pay

## 2018-12-28 ENCOUNTER — Ambulatory Visit: Payer: Medicare Other | Attending: Family | Admitting: Family

## 2018-12-28 VITALS — BP 107/70 | HR 91 | Resp 18 | Ht 64.0 in | Wt 154.5 lb

## 2018-12-28 DIAGNOSIS — E119 Type 2 diabetes mellitus without complications: Secondary | ICD-10-CM | POA: Diagnosis not present

## 2018-12-28 DIAGNOSIS — Z8249 Family history of ischemic heart disease and other diseases of the circulatory system: Secondary | ICD-10-CM | POA: Insufficient documentation

## 2018-12-28 DIAGNOSIS — Z7982 Long term (current) use of aspirin: Secondary | ICD-10-CM | POA: Diagnosis not present

## 2018-12-28 DIAGNOSIS — I10 Essential (primary) hypertension: Secondary | ICD-10-CM

## 2018-12-28 DIAGNOSIS — F17210 Nicotine dependence, cigarettes, uncomplicated: Secondary | ICD-10-CM | POA: Insufficient documentation

## 2018-12-28 DIAGNOSIS — Z950 Presence of cardiac pacemaker: Secondary | ICD-10-CM | POA: Diagnosis not present

## 2018-12-28 DIAGNOSIS — I110 Hypertensive heart disease with heart failure: Secondary | ICD-10-CM | POA: Insufficient documentation

## 2018-12-28 DIAGNOSIS — J449 Chronic obstructive pulmonary disease, unspecified: Secondary | ICD-10-CM | POA: Diagnosis not present

## 2018-12-28 DIAGNOSIS — Z7984 Long term (current) use of oral hypoglycemic drugs: Secondary | ICD-10-CM | POA: Diagnosis not present

## 2018-12-28 DIAGNOSIS — I5022 Chronic systolic (congestive) heart failure: Secondary | ICD-10-CM | POA: Diagnosis present

## 2018-12-28 DIAGNOSIS — Z955 Presence of coronary angioplasty implant and graft: Secondary | ICD-10-CM | POA: Insufficient documentation

## 2018-12-28 DIAGNOSIS — G4733 Obstructive sleep apnea (adult) (pediatric): Secondary | ICD-10-CM | POA: Diagnosis not present

## 2018-12-28 DIAGNOSIS — Z833 Family history of diabetes mellitus: Secondary | ICD-10-CM | POA: Diagnosis not present

## 2018-12-28 DIAGNOSIS — K219 Gastro-esophageal reflux disease without esophagitis: Secondary | ICD-10-CM | POA: Diagnosis not present

## 2018-12-28 DIAGNOSIS — E785 Hyperlipidemia, unspecified: Secondary | ICD-10-CM | POA: Diagnosis not present

## 2018-12-28 DIAGNOSIS — I252 Old myocardial infarction: Secondary | ICD-10-CM | POA: Insufficient documentation

## 2018-12-28 DIAGNOSIS — Z7951 Long term (current) use of inhaled steroids: Secondary | ICD-10-CM | POA: Insufficient documentation

## 2018-12-28 DIAGNOSIS — Z79899 Other long term (current) drug therapy: Secondary | ICD-10-CM | POA: Diagnosis not present

## 2018-12-28 DIAGNOSIS — I2510 Atherosclerotic heart disease of native coronary artery without angina pectoris: Secondary | ICD-10-CM | POA: Insufficient documentation

## 2018-12-28 DIAGNOSIS — R270 Ataxia, unspecified: Secondary | ICD-10-CM | POA: Diagnosis not present

## 2018-12-28 DIAGNOSIS — Z720 Tobacco use: Secondary | ICD-10-CM

## 2018-12-28 NOTE — Patient Instructions (Addendum)
Resume weighing daily and call for an overnight weight gain of > 2 pounds or a weekly weight gain of >5 pounds. 

## 2019-02-08 ENCOUNTER — Other Ambulatory Visit: Payer: Self-pay

## 2019-02-08 ENCOUNTER — Emergency Department: Payer: Medicare Other

## 2019-02-08 ENCOUNTER — Encounter: Payer: Self-pay | Admitting: Emergency Medicine

## 2019-02-08 ENCOUNTER — Emergency Department
Admission: EM | Admit: 2019-02-08 | Discharge: 2019-02-08 | Discharge status: Against Medical Advice | Payer: Medicare Other | Attending: Emergency Medicine | Admitting: Emergency Medicine

## 2019-02-08 DIAGNOSIS — J449 Chronic obstructive pulmonary disease, unspecified: Secondary | ICD-10-CM | POA: Diagnosis not present

## 2019-02-08 DIAGNOSIS — Z5321 Procedure and treatment not carried out due to patient leaving prior to being seen by health care provider: Secondary | ICD-10-CM | POA: Diagnosis not present

## 2019-02-08 DIAGNOSIS — R0789 Other chest pain: Secondary | ICD-10-CM | POA: Diagnosis present

## 2019-02-08 LAB — BASIC METABOLIC PANEL
Anion gap: 9 (ref 5–15)
BUN: 7 mg/dL — ABNORMAL LOW (ref 8–23)
CO2: 27 mmol/L (ref 22–32)
Calcium: 9.4 mg/dL (ref 8.9–10.3)
Chloride: 98 mmol/L (ref 98–111)
Creatinine, Ser: 0.91 mg/dL (ref 0.61–1.24)
GFR calc Af Amer: >60 mL/min (ref >60)
GFR calc non Af Amer: >60 mL/min (ref >60)
Glucose, Bld: 172 mg/dL — ABNORMAL HIGH (ref 70–99)
Potassium: 4.7 mmol/L (ref 3.5–5.1)
Sodium: 134 mmol/L — ABNORMAL LOW (ref 135–145)

## 2019-02-08 LAB — CBC
HCT: 37.2 % — ABNORMAL LOW (ref 39.0–52.0)
Hemoglobin: 12.4 g/dL — ABNORMAL LOW (ref 13.0–17.0)
MCH: 31 pg (ref 26.0–34.0)
MCHC: 33.3 g/dL (ref 30.0–36.0)
MCV: 93 fL (ref 80.0–100.0)
Platelets: 313 10*3/uL (ref 150–400)
RBC: 4 MIL/uL — ABNORMAL LOW (ref 4.22–5.81)
RDW: 15.7 % — ABNORMAL HIGH (ref 11.5–15.5)
WBC: 8.2 10*3/uL (ref 4.0–10.5)
nRBC: 0 % (ref 0.0–0.2)

## 2019-02-08 LAB — TROPONIN I (HIGH SENSITIVITY): Troponin I (High Sensitivity): 8 ng/L (ref <18)

## 2019-02-08 NOTE — ED Triage Notes (Signed)
Patient to ER for c/o chest pain to left chest since going to bed tonight. Patient denies any increased shortness of breath (has h/o COPD), but states he feels he may need a breathing treatment before leaving the ER tonight. Patient speaking in complete sentences without difficulty. Denies any nausea or diaphoresis.

## 2019-02-15 DIAGNOSIS — E1169 Type 2 diabetes mellitus with other specified complication: Secondary | ICD-10-CM | POA: Insufficient documentation

## 2019-02-15 DIAGNOSIS — E785 Hyperlipidemia, unspecified: Secondary | ICD-10-CM | POA: Insufficient documentation

## 2019-03-16 ENCOUNTER — Other Ambulatory Visit: Payer: Self-pay

## 2019-03-16 DIAGNOSIS — K2210 Ulcer of esophagus without bleeding: Secondary | ICD-10-CM

## 2019-03-28 ENCOUNTER — Other Ambulatory Visit: Payer: Self-pay

## 2019-03-28 ENCOUNTER — Other Ambulatory Visit
Admission: RE | Admit: 2019-03-28 | Discharge: 2019-03-28 | Disposition: A | Discharge status: Home / Self Care | Payer: Medicare Other | Source: Ambulatory Visit | Attending: Gastroenterology | Admitting: Gastroenterology

## 2019-03-28 DIAGNOSIS — Z20828 Contact with and (suspected) exposure to other viral communicable diseases: Secondary | ICD-10-CM | POA: Diagnosis not present

## 2019-03-28 DIAGNOSIS — Z01812 Encounter for preprocedural laboratory examination: Secondary | ICD-10-CM | POA: Insufficient documentation

## 2019-03-28 LAB — SARS CORONAVIRUS 2 (TAT 6-24 HRS): SARS Coronavirus 2: NEGATIVE

## 2019-03-31 ENCOUNTER — Other Ambulatory Visit: Payer: Self-pay

## 2019-03-31 ENCOUNTER — Ambulatory Visit: Payer: Medicare Other | Admitting: Certified Registered Nurse Anesthetist

## 2019-03-31 ENCOUNTER — Ambulatory Visit
Admission: RE | Admit: 2019-03-31 | Discharge: 2019-03-31 | Disposition: A | Discharge status: Home / Self Care | Payer: Medicare Other | Attending: Gastroenterology | Admitting: Gastroenterology

## 2019-03-31 ENCOUNTER — Encounter
Admission: RE | Disposition: A | Discharge status: Home / Self Care | Payer: Self-pay | Source: Home / Self Care | Attending: Gastroenterology

## 2019-03-31 ENCOUNTER — Encounter: Payer: Self-pay | Admitting: Certified Registered Nurse Anesthetist

## 2019-03-31 DIAGNOSIS — Z7984 Long term (current) use of oral hypoglycemic drugs: Secondary | ICD-10-CM | POA: Diagnosis not present

## 2019-03-31 DIAGNOSIS — E119 Type 2 diabetes mellitus without complications: Secondary | ICD-10-CM | POA: Insufficient documentation

## 2019-03-31 DIAGNOSIS — J449 Chronic obstructive pulmonary disease, unspecified: Secondary | ICD-10-CM | POA: Insufficient documentation

## 2019-03-31 DIAGNOSIS — G4730 Sleep apnea, unspecified: Secondary | ICD-10-CM | POA: Insufficient documentation

## 2019-03-31 DIAGNOSIS — K2210 Ulcer of esophagus without bleeding: Secondary | ICD-10-CM

## 2019-03-31 DIAGNOSIS — Z79899 Other long term (current) drug therapy: Secondary | ICD-10-CM | POA: Insufficient documentation

## 2019-03-31 DIAGNOSIS — Z955 Presence of coronary angioplasty implant and graft: Secondary | ICD-10-CM | POA: Diagnosis not present

## 2019-03-31 DIAGNOSIS — Z09 Encounter for follow-up examination after completed treatment for conditions other than malignant neoplasm: Secondary | ICD-10-CM | POA: Insufficient documentation

## 2019-03-31 DIAGNOSIS — I110 Hypertensive heart disease with heart failure: Secondary | ICD-10-CM | POA: Diagnosis not present

## 2019-03-31 DIAGNOSIS — Z7982 Long term (current) use of aspirin: Secondary | ICD-10-CM | POA: Diagnosis not present

## 2019-03-31 DIAGNOSIS — I5022 Chronic systolic (congestive) heart failure: Secondary | ICD-10-CM | POA: Insufficient documentation

## 2019-03-31 DIAGNOSIS — K209 Esophagitis, unspecified without bleeding: Secondary | ICD-10-CM

## 2019-03-31 DIAGNOSIS — Z7902 Long term (current) use of antithrombotics/antiplatelets: Secondary | ICD-10-CM | POA: Insufficient documentation

## 2019-03-31 DIAGNOSIS — K219 Gastro-esophageal reflux disease without esophagitis: Secondary | ICD-10-CM | POA: Diagnosis not present

## 2019-03-31 DIAGNOSIS — E785 Hyperlipidemia, unspecified: Secondary | ICD-10-CM | POA: Diagnosis not present

## 2019-03-31 DIAGNOSIS — I252 Old myocardial infarction: Secondary | ICD-10-CM | POA: Diagnosis not present

## 2019-03-31 DIAGNOSIS — I2510 Atherosclerotic heart disease of native coronary artery without angina pectoris: Secondary | ICD-10-CM | POA: Insufficient documentation

## 2019-03-31 DIAGNOSIS — Z950 Presence of cardiac pacemaker: Secondary | ICD-10-CM | POA: Insufficient documentation

## 2019-03-31 DIAGNOSIS — F17210 Nicotine dependence, cigarettes, uncomplicated: Secondary | ICD-10-CM | POA: Diagnosis not present

## 2019-03-31 DIAGNOSIS — K7460 Unspecified cirrhosis of liver: Secondary | ICD-10-CM | POA: Diagnosis not present

## 2019-03-31 HISTORY — PX: ESOPHAGOGASTRODUODENOSCOPY (EGD) WITH PROPOFOL: SHX5813

## 2019-03-31 LAB — GLUCOSE, CAPILLARY: Glucose-Capillary: 177 mg/dL — ABNORMAL HIGH (ref 70–99)

## 2019-03-31 SURGERY — ESOPHAGOGASTRODUODENOSCOPY (EGD) WITH PROPOFOL
Anesthesia: General

## 2019-03-31 MED ORDER — LIDOCAINE HCL (PF) 1 % IJ SOLN
INTRAMUSCULAR | Status: AC
  Filled 2019-03-31: qty 2

## 2019-03-31 MED ORDER — PROPOFOL 500 MG/50ML IV EMUL
INTRAVENOUS | Status: DC | PRN
  Administered 2019-03-31: 130 ug/kg/min via INTRAVENOUS

## 2019-03-31 MED ORDER — LIDOCAINE HCL (PF) 2 % IJ SOLN
INTRAMUSCULAR | Status: AC
  Filled 2019-03-31: qty 10

## 2019-03-31 MED ORDER — PROPOFOL 10 MG/ML IV BOLUS
INTRAVENOUS | Status: DC | PRN
  Administered 2019-03-31: 15 mg via INTRAVENOUS
  Administered 2019-03-31: 80 mg via INTRAVENOUS

## 2019-03-31 MED ORDER — LIDOCAINE HCL (CARDIAC) PF 100 MG/5ML IV SOSY
PREFILLED_SYRINGE | INTRAVENOUS | Status: DC | PRN
  Administered 2019-03-31: 50 mg via INTRAVENOUS

## 2019-03-31 MED ORDER — PROPOFOL 500 MG/50ML IV EMUL
INTRAVENOUS | Status: AC
  Filled 2019-03-31: qty 50

## 2019-03-31 MED ORDER — SODIUM CHLORIDE 0.9 % IV SOLN
INTRAVENOUS | Status: DC
  Administered 2019-03-31: 1000 mL via INTRAVENOUS

## 2019-03-31 NOTE — H&P (Signed)
John Darby, MD [address]  [place]  [place], [place] [address]  Main: [phone]  Fax: [phone] Pager: [phone]  Primary Care Physician:  Idelle Crouch, MD Primary Gastroenterologist:  Dr. Cephas Frank  Pre-Procedure History & Physical: HPI:  John Frank is a 63 y.o. male is here for an endoscopy.   Past Medical History:  Diagnosis Date  . CAD (coronary artery disease)    s/p stents in 2017  . Chronic systolic CHF (congestive heart failure) ([place])   . COPD (chronic obstructive pulmonary disease) ([place])   . Diabetes ([place])   . GERD (gastroesophageal reflux disease)   . Heart attack ([place])   . Hyperlipidemia   . Hypertension   . Presence of permanent cardiac pacemaker    and defibrillator  . Shortness of breath dyspnea   . Sleep apnea     Past Surgical History:  Procedure Laterality Date  . cardiac stents    . CARDIAC SURGERY     defibrillator and pacemaker  . CORONARY ANGIOPLASTY    . ESOPHAGOGASTRODUODENOSCOPY N/A 09/19/2014   Procedure: ESOPHAGOGASTRODUODENOSCOPY (EGD);  Surgeon: Lucilla Lame, MD;  Location: [facility] ENDOSCOPY;  Service: Endoscopy;  Laterality: N/A;  . ESOPHAGOGASTRODUODENOSCOPY (EGD) WITH PROPOFOL N/A 09/28/2017   Procedure: ESOPHAGOGASTRODUODENOSCOPY (EGD) WITH PROPOFOL;  Surgeon: Virgel Manifold, MD;  Location: [facility] ENDOSCOPY;  Service: Endoscopy;  Laterality: N/A;  . ESOPHAGOGASTRODUODENOSCOPY (EGD) WITH PROPOFOL N/A 04/19/2018   Procedure: ESOPHAGOGASTRODUODENOSCOPY (EGD) WITH PROPOFOL with Gastric Mapping;  Surgeon: [name], MD;  Location: [facility] ENDOSCOPY;  Service: Gastroenterology;  Laterality: N/A;  . ESOPHAGOGASTRODUODENOSCOPY (EGD) WITH PROPOFOL N/A 12/13/2018   Procedure: ESOPHAGOGASTRODUODENOSCOPY (EGD) WITH PROPOFOL;  Surgeon: [name], MD;  Location: [facility] ENDOSCOPY;  Service: Gastroenterology;  Laterality: N/A;    Prior to Admission medications   Medication Sig Start Date End Date Taking? Authorizing  Provider  albuterol (PROVENTIL HFA;VENTOLIN HFA) 108 (90 Base) MCG/ACT inhaler Inhale 2 puffs into the lungs every 6 (six) hours as needed for wheezing or shortness of breath. 05/26/17  Yes Henreitta Leber, MD  aspirin EC 81 MG tablet Take 81 mg by mouth daily.    [provider]  atorvastatin (LIPITOR) 40 MG tablet TAKE 1 TABLET (40 MG TOTAL) BY MOUTH DAILY. 03/08/18   Cannady, Henrine Screws T, NP  azithromycin (ZITHROMAX) 500 MG tablet Take 1 tablet (500 mg total) by mouth daily. 11/20/18   Mayo, Pete Pelt, MD  budesonide-formoterol [facility]) 160-4.5 MCG/ACT inhaler Inhale 2 puffs into the lungs 2 (two) times daily. 06/15/15   Kathrine Haddock, NP  buPROPion (WELLBUTRIN) 75 MG tablet Take 75 mg by mouth daily. 03/26/16 12/28/18  [provider]  clopidogrel (PLAVIX) 75 MG tablet TAKE 1 TABLET (75 MG TOTAL) BY MOUTH DAILY. 12/08/16   Kathrine Haddock, NP  famotidine (PEPCID) 40 MG tablet TAKE 1 TABLET BY MOUTH EVERY DAY 09/09/17   Kathrine Haddock, NP  furosemide (LASIX) 40 MG tablet Take 1 tablet (40 mg total) by mouth daily. Patient taking differently: Take 40 mg by mouth 2 (two) times daily.  08/11/17   Loletha Grayer, MD  glimepiride (AMARYL) 4 MG tablet Take 4 mg by mouth daily with breakfast. 12/01/18   [provider]  ipratropium-albuterol (DUONEB) 0.5-2.5 (3) MG/3ML SOLN Take 3 mLs by nebulization every 6 (six) hours as needed (wheezing; shortness of breath).    [provider]  JANUVIA 100 MG tablet TAKE 1 TABLET (100 MG TOTAL) BY MOUTH DAILY. 09/14/17   Kathrine Haddock, NP  levocetirizine (XYZAL) 5 MG tablet Take 5 mg by mouth daily. 11/10/18   [provider]  metFORMIN (GLUCOPHAGE) 1000 MG tablet TAKE 1 TABLET (1,000 MG TOTAL) BY MOUTH 2 (TWO) TIMES DAILY WITH A MEAL. 09/12/16   Kathrine Haddock, NP  metoprolol succinate (TOPROL-XL) 100 MG 24 hr tablet Take 100 mg by mouth daily.  08/19/17   [provider]  ONE TOUCH ULTRA TEST test strip USE TO TEST BLOOD  SUGAR ONCE DAILY 10/07/16   Wynetta Emery, Megan P, DO  pantoprazole (PROTONIX) 40 MG tablet Take 1 tablet (40 mg total) by mouth every morning. 06/29/17   [name], MD  pantoprazole (PROTONIX) 40 MG tablet Take 1 tablet (40 mg total) by mouth 2 (two) times daily before a meal. 12/13/18 03/13/19  , Tally Due, MD  predniSONE (DELTASONE) 50 MG tablet Take 1 tablet (50 mg total) by mouth daily with breakfast. 11/20/18   Mayo, Pete Pelt, MD  sacubitril-valsartan (ENTRESTO) 49-51 MG Take 1 tablet by mouth 2 (two) times daily. 06/14/18   Alisa Graff, FNP  spironolactone (ALDACTONE) 25 MG tablet TAKE 1/2 TABLET BY MOUTH EVERY DAY 10/04/18   Darylene Price A, FNP  sucralfate (CARAFATE) 1 g tablet Take 1 g by mouth 4 (four) times daily -  with meals and at bedtime.  05/15/17 12/28/18  [provider]    Allergies as of 03/16/2019  . (No Known Allergies)    Family History  Problem Relation Age of Onset  . Diabetes Mother   . Heart attack Father 63    Social History   Socioeconomic History  . Marital status: Single    Spouse name: Not on file  . Number of children: Not on file  . Years of education: Not on file  . Highest education level: Not on file  Occupational History  . Not on file  Social Needs  . Financial resource strain: Not on file  . Food insecurity    Worry: Not on file    Inability: Not on file  . Transportation needs    Medical: Not on file    Non-medical: Not on file  Tobacco Use  . Smoking status: Current Every Day Smoker    Packs/day: 0.25    Years: 40.00    Pack years: 10.00    Types: Cigarettes  . Smokeless tobacco: Former Network engineer and Sexual Activity  . Alcohol use: No  . Drug use: Never  . Sexual activity: Yes  Lifestyle  . Physical activity    Days per week: Not on file    Minutes per session: Not on file  . Stress: Not on file  Relationships  . Social Herbalist on phone: Not on file    Gets together: Not on file    Attends  religious service: Not on file    Active member of club or organization: Not on file    Attends meetings of clubs or organizations: Not on file    Relationship status: Not on file  . Intimate partner violence    Fear of current or ex partner: Not on file    Emotionally abused: Not on file    Physically abused: Not on file    Forced sexual activity: Not on file  Other Topics Concern  . Not on file  Social History Narrative   Independent at baseline.  Lives at home with his family    Review of Systems: See HPI, otherwise negative ROS  Physical  Exam: BP (!) 133/98   Pulse 82   Temp (!) 97.3 F (36.3 C) (Skin)   Resp 16   Ht 5\' 4"  (1.626 m)   Wt 60.3 kg   SpO2 96%   BMI 22.83 kg/m  General:   Alert,  pleasant and cooperative in NAD Head:  Normocephalic and atraumatic. Neck:  Supple; no masses or thyromegaly. Lungs:  Clear throughout to auscultation.    Heart:  Regular rate and rhythm. Abdomen:  Soft, nontender and nondistended. Normal bowel sounds, without guarding, and without rebound.   Neurologic:  Alert and  oriented x4;  grossly normal neurologically.  Impression/Plan: John Frank is here for an endoscopy to be performed for follow up of esophageal ulcer  Risks, benefits, limitations, and alternatives regarding upper endoscopy have been reviewed with the patient.  Questions have been answered.  All parties agreeable.   Sherri Sear, MD  03/31/2019, 9:08 AM

## 2019-03-31 NOTE — Anesthesia Postprocedure Evaluation (Signed)
Anesthesia Post Note  Patient: CLEOPHAS YOAK  Procedure(s) Performed: ESOPHAGOGASTRODUODENOSCOPY (EGD) WITH PROPOFOL (N/A )  Patient location during evaluation: Endoscopy Anesthesia Type: General Level of consciousness: awake and alert Pain management: pain level controlled Vital Signs Assessment: post-procedure vital signs reviewed and stable Respiratory status: spontaneous breathing, nonlabored ventilation, respiratory function stable and patient connected to nasal cannula oxygen Cardiovascular status: blood pressure returned to baseline and stable Postop Assessment: no apparent nausea or vomiting Anesthetic complications: no     Last Vitals:  Vitals:   03/31/19 0937 03/31/19 0947  BP:  124/78  Pulse: 83 92  Resp: 19 (!) 22  Temp:    SpO2: 95% 95%    Last Pain:  Vitals:   03/31/19 0947  TempSrc:   PainSc: 0-No pain                 Martha Clan

## 2019-03-31 NOTE — Anesthesia Preprocedure Evaluation (Signed)
Anesthesia Evaluation  Patient identified by MRN, date of birth, ID band Patient awake    Reviewed: Allergy & Precautions, NPO status , Patient's Chart, lab work & pertinent test results, reviewed documented beta blocker date and time   History of Anesthesia Complications Negative for: history of anesthetic complications  Airway Mallampati: III  TM Distance: >3 FB     Dental  (+) Chipped   Pulmonary shortness of breath, sleep apnea , COPD, Current Smoker,           Cardiovascular hypertension, Pt. on medications and Pt. on home beta blockers + CAD, + Past MI, + Cardiac Stents and +CHF  + pacemaker + Cardiac Defibrillator      Neuro/Psych    GI/Hepatic GERD  ,(+) Cirrhosis       ,   Endo/Other  diabetes, Type 2  Renal/GU      Musculoskeletal   Abdominal   Peds  Hematology  (+) Blood dyscrasia, anemia ,   Anesthesia Other Findings Smokes.  Reproductive/Obstetrics                             Anesthesia Physical  Anesthesia Plan  ASA: III  Anesthesia Plan: General   Post-op Pain Management:    Induction: Intravenous  PONV Risk Score and Plan: Propofol infusion and TIVA  Airway Management Planned: Nasal Cannula and Natural Airway  Additional Equipment:   Intra-op Plan:   Post-operative Plan:   Informed Consent: I have reviewed the patients History and Physical, chart, labs and discussed the procedure including the risks, benefits and alternatives for the proposed anesthesia with the patient or authorized representative who has indicated his/her understanding and acceptance.       Plan Discussed with: CRNA  Anesthesia Plan Comments:         Anesthesia Quick Evaluation

## 2019-03-31 NOTE — Op Note (Signed)
[facility] Gastroenterology Patient Name: John Frank Procedure Date: 03/31/2019 7:33 AM MRN: [id number] Account #: [id number] Date of Birth: 24-Feb-1956 Admit Type: Outpatient Age: 63 Room: [facility] ENDO ROOM 1 Gender: Male Note Status: Finalized Procedure:             Upper GI endoscopy Indications:           Follow-up of esophagitis Providers:             Lin Landsman MD, MD Referring MD:          Leonie Douglas. Doy Hutching, MD (Referring MD) Medicines:             Monitored Anesthesia Care Complications:         No immediate complications. Estimated blood loss: None. Procedure:             Pre-Anesthesia Assessment:                        - Prior to the procedure, a History and Physical was                         performed, and patient medications and allergies were                         reviewed. The patient is competent. The risks and                         benefits of the procedure and the sedation options and                         risks were discussed with the patient. All questions                         were answered and informed consent was obtained.                         Patient identification and proposed procedure were                         verified by the physician, the nurse, the                         anesthesiologist, the anesthetist and the technician                         in the pre-procedure area in the procedure room in the                         endoscopy suite. Mental Status Examination: alert and                         oriented. Airway Examination: normal oropharyngeal                         airway and neck mobility. Respiratory Examination:                         clear to auscultation. CV Examination: normal.  Prophylactic Antibiotics: The patient does not require                         prophylactic antibiotics. Prior Anticoagulants: The                         patient has taken Plavix (clopidogrel),  last dose was                         5 days prior to procedure. ASA Grade Assessment: III -                         A patient with severe systemic disease. After                         reviewing the risks and benefits, the patient was                         deemed in satisfactory condition to undergo the                         procedure. The anesthesia plan was to use monitored                         anesthesia care (MAC). Immediately prior to                         administration of medications, the patient was                         re-assessed for adequacy to receive sedatives. The                         heart rate, respiratory rate, oxygen saturations,                         blood pressure, adequacy of pulmonary ventilation, and                         response to care were monitored throughout the                         procedure. The physical status of the patient was                         re-assessed after the procedure.                        After obtaining informed consent, the endoscope was                         passed under direct vision. Throughout the procedure,                         the patient's blood pressure, pulse, and oxygen                         saturations were monitored continuously. The Endoscope  was introduced through the mouth, and advanced to the                         second part of duodenum. The upper GI endoscopy was                         accomplished without difficulty. The patient tolerated                         the procedure fairly well. Findings:      The duodenal bulb and second portion of the duodenum were normal.      The entire examined stomach was normal.      The cardia and gastric fundus were normal on retroflexion.      The gastroesophageal junction and examined esophagus were normal,       previously seen ulcer at the GE junction has completely healed Impression:            - Normal duodenal bulb and  second portion of the                         duodenum.                        - Normal stomach.                        - Normal gastroesophageal junction and esophagus.                        - No specimens collected. Recommendation:        - Discharge patient to home (with escort).                        - Resume previous diet today.                        - Continue present medications.                        - Resume Plavix (clopidogrel) at prior dose today.                         Refer to managing physician for further adjustment of                         therapy. Procedure Code(s):     --- Professional ---                        [phone], Esophagogastroduodenoscopy, flexible,                         transoral; diagnostic, including collection of                         specimen(s) by brushing or washing, when performed                         (separate procedure) Diagnosis Code(s):     --- Professional ---  K20.90 CPT copyright 2019 American Medical Association. All rights reserved. The codes documented in this report are preliminary and upon coder review may  be revised to meet current compliance requirements. Dr. Ulyess Mort Lin Landsman MD, MD 03/31/2019 9:05:58 AM This report has been signed electronically. Number of Addenda: 0 Note Initiated On: 03/31/2019 7:33 AM Estimated Blood Loss:  Estimated blood loss: none. Estimated blood loss: none.      [facility]

## 2019-03-31 NOTE — Anesthesia Post-op Follow-up Note (Signed)
Anesthesia QCDR form completed.        

## 2019-03-31 NOTE — Transfer of Care (Signed)
Immediate Anesthesia Transfer of Care Note  Patient: John Frank  Procedure(s) Performed: ESOPHAGOGASTRODUODENOSCOPY (EGD) WITH PROPOFOL (N/A )  Patient Location: PACU and Endoscopy Unit  Anesthesia Type:General  Level of Consciousness: drowsy  Airway & Oxygen Therapy: Patient Spontanous Breathing and Patient connected to nasal cannula oxygen  Post-op Assessment: Report given to RN and Post -op Vital signs reviewed and stable  Post vital signs: Reviewed and stable  Last Vitals:  Vitals Value Taken Time  BP 124/67 03/31/19 0908  Temp 36.5 C 03/31/19 0907  Pulse 90 03/31/19 0908  Resp 22 03/31/19 0908  SpO2 95 % 03/31/19 0908  Vitals shown include unvalidated device data.  Last Pain:  Vitals:   03/31/19 0724  TempSrc: Skin  PainSc: 0-No pain         Complications: No apparent anesthesia complications

## 2019-04-01 ENCOUNTER — Encounter: Payer: Self-pay | Admitting: Gastroenterology

## 2019-05-02 ENCOUNTER — Other Ambulatory Visit: Payer: Self-pay | Admitting: Family

## 2019-05-02 DIAGNOSIS — I5022 Chronic systolic (congestive) heart failure: Secondary | ICD-10-CM

## 2019-05-26 ENCOUNTER — Encounter: Payer: Self-pay | Admitting: Urology

## 2019-05-26 ENCOUNTER — Ambulatory Visit: Payer: Medicare Other | Admitting: Urology

## 2019-06-14 ENCOUNTER — Other Ambulatory Visit: Payer: Self-pay | Admitting: Gastroenterology

## 2019-06-21 ENCOUNTER — Ambulatory Visit: Payer: Medicare Other | Admitting: Urology

## 2019-06-21 ENCOUNTER — Other Ambulatory Visit: Payer: Self-pay | Admitting: Family

## 2019-06-27 NOTE — Progress Notes (Signed)
Patient ID: John Frank, male    DOB: 05/16/1955, 64 y.o.   MRN: [id number]  HPI  John Frank is a 64 y/o male with a history of CAD, DM, hyperlipidemia, HTN, GERD, COPD, obstructive sleep apnea, chronic heart failure and current tobacco use.   Echo report from 11/19/2018 reviewed and showed an EF of 40-45%. Echo report from 04/13/17 reviewed and showed an EF of 30% along with mild John/TR.  Was in the ED 02/08/2019 but left without being seen.   He presents today for a follow-up visit with a chief complaint of productive cough "especially after smoking". He denies any other symptoms and specifically denies any difficulty sleeping, dizziness, abdominal distention, palpitations, pedal edema, chest pain, shortness of breath, fatigue or weight gain.   Says that he has difficulty swallowing his metformin and sulcrafate although he has continued taking them. Does not have his metoprolol with him and says that he doesn't think he's taking it.   Past Medical History:  Diagnosis Date  . CAD (coronary artery disease)    s/p stents in 2017  . Chronic systolic CHF (congestive heart failure) ([place])   . COPD (chronic obstructive pulmonary disease) ([place])   . Diabetes ([place])   . GERD (gastroesophageal reflux disease)   . Heart attack ([place])   . Hyperlipidemia   . Hypertension   . Presence of permanent cardiac pacemaker    and defibrillator  . Shortness of breath dyspnea   . Sleep apnea    Past Surgical History:  Procedure Laterality Date  . cardiac stents    . CARDIAC SURGERY     defibrillator and pacemaker  . CORONARY ANGIOPLASTY    . ESOPHAGOGASTRODUODENOSCOPY N/A 09/19/2014   Procedure: ESOPHAGOGASTRODUODENOSCOPY (EGD);  Surgeon: Lucilla Lame, MD;  Location: [facility] ENDOSCOPY;  Service: Endoscopy;  Laterality: N/A;  . ESOPHAGOGASTRODUODENOSCOPY (EGD) WITH PROPOFOL N/A 09/28/2017   Procedure: ESOPHAGOGASTRODUODENOSCOPY (EGD) WITH PROPOFOL;  Surgeon: Virgel Manifold, MD;  Location: [facility] ENDOSCOPY;   Service: Endoscopy;  Laterality: N/A;  . ESOPHAGOGASTRODUODENOSCOPY (EGD) WITH PROPOFOL N/A 04/19/2018   Procedure: ESOPHAGOGASTRODUODENOSCOPY (EGD) WITH PROPOFOL with Gastric Mapping;  Surgeon: [name], MD;  Location: [facility] ENDOSCOPY;  Service: Gastroenterology;  Laterality: N/A;  . ESOPHAGOGASTRODUODENOSCOPY (EGD) WITH PROPOFOL N/A 12/13/2018   Procedure: ESOPHAGOGASTRODUODENOSCOPY (EGD) WITH PROPOFOL;  Surgeon: [name], MD;  Location: [facility] ENDOSCOPY;  Service: Gastroenterology;  Laterality: N/A;  . ESOPHAGOGASTRODUODENOSCOPY (EGD) WITH PROPOFOL N/A 03/31/2019   Procedure: ESOPHAGOGASTRODUODENOSCOPY (EGD) WITH PROPOFOL;  Surgeon: Lin Landsman, MD;  Location: [facility] ENDOSCOPY;  Service: Gastroenterology;  Laterality: N/A;   Family History  Problem Relation Age of Onset  . Diabetes Mother   . Heart attack Father 30   Social History   Tobacco Use  . Smoking status: Current Every Day Smoker    Packs/day: 0.25    Years: 40.00    Pack years: 10.00    Types: Cigarettes  . Smokeless tobacco: Former Network engineer Use Topics  . Alcohol use: No   No Known Allergies  Prior to Admission medications   Medication Sig Start Date End Date Taking? Authorizing Provider  albuterol (PROVENTIL HFA;VENTOLIN HFA) 108 (90 Base) MCG/ACT inhaler Inhale 2 puffs into the lungs every 6 (six) hours as needed for wheezing or shortness of breath. 05/26/17  Yes Henreitta Leber, MD  aspirin EC 81 MG tablet Take 81 mg by mouth daily.   Yes [provider]  atorvastatin (LIPITOR) 40 MG tablet TAKE 1 TABLET (40 MG TOTAL) BY MOUTH  DAILY. 03/08/18  Yes Cannady, Jolene T, NP  budesonide-formoterol (SYMBICORT) 160-4.5 MCG/ACT inhaler Inhale 2 puffs into the lungs 2 (two) times daily. 06/15/15  Yes Kathrine Haddock, NP  buPROPion (WELLBUTRIN) 75 MG tablet Take 75 mg by mouth daily. 03/26/16 06/28/19 Yes [provider]  clopidogrel (PLAVIX) 75 MG tablet TAKE 1 TABLET (75 MG TOTAL) BY MOUTH DAILY.  12/08/16  Yes Kathrine Haddock, NP  ENTRESTO 49-51 MG TAKE 1 TABLET BY MOUTH TWICE A DAY 06/21/19  Yes Darylene Price A, FNP  furosemide (LASIX) 40 MG tablet Take 1 tablet (40 mg total) by mouth daily. Patient taking differently: Take 40 mg by mouth 2 (two) times daily.  08/11/17  Yes Wieting, Richard, MD  glimepiride (AMARYL) 4 MG tablet Take 4 mg by mouth daily with breakfast. 12/01/18  Yes [provider]  ipratropium-albuterol (DUONEB) 0.5-2.5 (3) MG/3ML SOLN Take 3 mLs by nebulization every 6 (six) hours as needed (wheezing; shortness of breath).   Yes [provider]  levocetirizine (XYZAL) 5 MG tablet Take 5 mg by mouth daily. 11/10/18  Yes [provider]  metFORMIN (GLUCOPHAGE) 1000 MG tablet TAKE 1 TABLET (1,000 MG TOTAL) BY MOUTH 2 (TWO) TIMES DAILY WITH A MEAL. 09/12/16  Yes Kathrine Haddock, NP  ONE TOUCH ULTRA TEST test strip USE TO TEST BLOOD SUGAR ONCE DAILY 10/07/16  Yes Johnson, Megan P, DO  pantoprazole (PROTONIX) 40 MG tablet Take 1 tablet (40 mg total) by mouth 2 (two) times daily before a meal. 12/13/18 06/28/19 Yes Vanga, Tally Due, MD  spironolactone (ALDACTONE) 25 MG tablet TAKE 1/2 TABLET BY MOUTH EVERY DAY 05/02/19  Yes Darylene Price A, FNP  sucralfate (CARAFATE) 1 g tablet Take 1 g by mouth 4 (four) times daily -  with meals and at bedtime.  05/15/17 06/28/19 Yes [provider]     Review of Systems  Constitutional: Negative for appetite change, fatigue and fever.  HENT: Negative for congestion, postnasal drip and sore throat.   Eyes: Negative.   Respiratory: Positive for cough. Negative for chest tightness, shortness of breath and wheezing.   Cardiovascular: Negative for chest pain, palpitations and leg swelling.  Gastrointestinal: Negative for abdominal distention and abdominal pain.  Endocrine: Negative.   Genitourinary: Negative.   Musculoskeletal: Negative for back pain and neck pain.  Skin: Negative.   Allergic/Immunologic: Negative.    Neurological: Negative for dizziness, light-headedness and headaches.  Hematological: Negative for adenopathy. Does not bruise/bleed easily.  Psychiatric/Behavioral: Negative for dysphoric mood and sleep disturbance (wearing CPAP ). The patient is not nervous/anxious.    Vitals:   06/28/19 0840  BP: 129/86  Pulse: 87  Resp: 18  SpO2: 96%  Weight: 156 lb (70.8 kg)  Height: 5\' 4"  (1.626 m)   Wt Readings from Last 3 Encounters:  06/28/19 156 lb (70.8 kg)  03/31/19 133 lb (60.3 kg)  02/08/19 145 lb (65.8 kg)   Lab Results  Component Value Date   CREATININE 0.91 02/08/2019   CREATININE 1.14 11/19/2018   CREATININE 0.86 11/18/2018    Physical Exam  Constitutional: He is oriented to person, place, and time. He appears well-developed and well-nourished.  HENT:  Head: Normocephalic and atraumatic.  Neck: No JVD present.  Cardiovascular: Normal rate and regular rhythm.  Pulmonary/Chest: Effort normal. He has no wheezes. He has no rhonchi. He has no rales.  Abdominal: Soft. He exhibits no distension. There is no abdominal tenderness.  Musculoskeletal:        General: No tenderness or  edema.     Cervical back: Normal range of motion and neck supple.  Neurological: He is alert and oriented to person, place, and time.  Skin: Skin is warm and dry.  Psychiatric: He has a normal mood and affect. His behavior is normal. Thought content normal.  Nursing note and vitals reviewed.  Assessment & Plan:  1: Chronic heart failure with reduced ejection fraction- - NYHA class I - euvolemic today - weighing daily; reminded to call for an overnight weight gain of >2 pounds or a weekly weight gain of >5 pounds.  - weight up 2 pounds from last visit here 6 months ago - not adding salt; using Mrs. Dash; admits that he's not reading food labels; reminded him to keep his daily sodium intake to 2000mg  a day. Does eat quite a bit of processed food; rinses canned vegetables out - saw cardiology  Clayborn Bigness) 05/23/19 - saw pulmonologist Raul Del) 11/11/2018 - BNP 08/09/17 was 756.0 - currently has pacemaker/defibrillator  - will resume metoprolol succinate 100mg  daily - reports receiving his flu vaccine for this season  2: HTN- - BP looks good today - saw PCP ([facility]) 05/20/19 - BMP 05/20/19 reviewed and showed sodium 139, potassium 4.4, creatinine 1.2 and GFR 61  3: Diabetes- - saw endocrinology Honor Junes) 05/26/19 - fasting glucose in clinic today was 163; has been started on trulicity weekly although he's only been doing it monthly; explained that he needed to do this weekly and this information was written on his AVS - A1c 05/20/19 was 8.7%  4: Tobacco use- - currently smoking 2-3 cigarettes daily  - complete cessation discussed for 3 minutes with patient   Medication bottles reviewed.   Return in 1 month or sooner for any questions/problems before then.

## 2019-06-28 ENCOUNTER — Ambulatory Visit: Payer: Medicare Other | Attending: Family | Admitting: Family

## 2019-06-28 ENCOUNTER — Other Ambulatory Visit: Payer: Self-pay

## 2019-06-28 ENCOUNTER — Encounter: Payer: Self-pay | Admitting: Family

## 2019-06-28 VITALS — BP 129/86 | HR 87 | Resp 18 | Ht 64.0 in | Wt 156.0 lb

## 2019-06-28 DIAGNOSIS — G4733 Obstructive sleep apnea (adult) (pediatric): Secondary | ICD-10-CM | POA: Diagnosis not present

## 2019-06-28 DIAGNOSIS — Z833 Family history of diabetes mellitus: Secondary | ICD-10-CM | POA: Insufficient documentation

## 2019-06-28 DIAGNOSIS — J449 Chronic obstructive pulmonary disease, unspecified: Secondary | ICD-10-CM | POA: Insufficient documentation

## 2019-06-28 DIAGNOSIS — E785 Hyperlipidemia, unspecified: Secondary | ICD-10-CM | POA: Insufficient documentation

## 2019-06-28 DIAGNOSIS — I252 Old myocardial infarction: Secondary | ICD-10-CM | POA: Insufficient documentation

## 2019-06-28 DIAGNOSIS — Z7984 Long term (current) use of oral hypoglycemic drugs: Secondary | ICD-10-CM | POA: Insufficient documentation

## 2019-06-28 DIAGNOSIS — I5022 Chronic systolic (congestive) heart failure: Secondary | ICD-10-CM | POA: Diagnosis not present

## 2019-06-28 DIAGNOSIS — G4730 Sleep apnea, unspecified: Secondary | ICD-10-CM | POA: Diagnosis not present

## 2019-06-28 DIAGNOSIS — Z7951 Long term (current) use of inhaled steroids: Secondary | ICD-10-CM | POA: Insufficient documentation

## 2019-06-28 DIAGNOSIS — Z79899 Other long term (current) drug therapy: Secondary | ICD-10-CM | POA: Insufficient documentation

## 2019-06-28 DIAGNOSIS — I110 Hypertensive heart disease with heart failure: Secondary | ICD-10-CM | POA: Insufficient documentation

## 2019-06-28 DIAGNOSIS — Z720 Tobacco use: Secondary | ICD-10-CM

## 2019-06-28 DIAGNOSIS — Z95810 Presence of automatic (implantable) cardiac defibrillator: Secondary | ICD-10-CM | POA: Insufficient documentation

## 2019-06-28 DIAGNOSIS — K219 Gastro-esophageal reflux disease without esophagitis: Secondary | ICD-10-CM | POA: Diagnosis not present

## 2019-06-28 DIAGNOSIS — Z8249 Family history of ischemic heart disease and other diseases of the circulatory system: Secondary | ICD-10-CM | POA: Insufficient documentation

## 2019-06-28 DIAGNOSIS — F17210 Nicotine dependence, cigarettes, uncomplicated: Secondary | ICD-10-CM | POA: Insufficient documentation

## 2019-06-28 DIAGNOSIS — Z7902 Long term (current) use of antithrombotics/antiplatelets: Secondary | ICD-10-CM | POA: Insufficient documentation

## 2019-06-28 DIAGNOSIS — I10 Essential (primary) hypertension: Secondary | ICD-10-CM

## 2019-06-28 DIAGNOSIS — E119 Type 2 diabetes mellitus without complications: Secondary | ICD-10-CM

## 2019-06-28 DIAGNOSIS — I2510 Atherosclerotic heart disease of native coronary artery without angina pectoris: Secondary | ICD-10-CM | POA: Diagnosis not present

## 2019-06-28 DIAGNOSIS — Z7982 Long term (current) use of aspirin: Secondary | ICD-10-CM | POA: Diagnosis not present

## 2019-06-28 DIAGNOSIS — Z955 Presence of coronary angioplasty implant and graft: Secondary | ICD-10-CM | POA: Insufficient documentation

## 2019-06-28 LAB — GLUCOSE, CAPILLARY: Glucose-Capillary: 163 mg/dL — ABNORMAL HIGH (ref 70–99)

## 2019-06-28 MED ORDER — METOPROLOL SUCCINATE ER 100 MG PO TB24: 100.0000 mg | ORAL_TABLET | Freq: Every day | ORAL | 3 refills | Status: DC

## 2019-06-28 NOTE — Patient Instructions (Addendum)
Continue weighing daily and call for an overnight weight gain of > 2 pounds or a weekly weight gain of >5 pounds.  Start using your Trulicity once a week per diabetes doctor.   Resume metoprolol 100mg  once a day

## 2019-07-25 NOTE — Progress Notes (Signed)
Patient ID: John Frank, male    DOB: [birth year], 64 y.o.   MRN: [id number]  HPI  John Frank is a 64 y/o male with a history of CAD, DM, hyperlipidemia, HTN, GERD, COPD, obstructive sleep apnea, chronic heart failure and current tobacco use.   Echo report from 11/19/2018 reviewed and showed an EF of 40-45%. Echo report from 04/13/17 reviewed and showed an EF of 30% along with mild John/TR.  Was in the ED 02/08/2019 but left without being seen.   He presents today with a chief complaint of a follow-up visit. He currently has no symptoms and specifically denies any difficulty sleeping, dizziness, abdominal distention, palpitations, pedal edema, chest pain, shortness of breath, wheezing, cough, fatigue or weight gain.   Has resumed metoprolol succinate at last visit without known side effects.   Past Medical History:  Diagnosis Date  . CAD (coronary artery disease)    s/p stents in 2017  . Chronic systolic CHF (congestive heart failure) ([place])   . COPD (chronic obstructive pulmonary disease) ([place])   . Diabetes ([place])   . GERD (gastroesophageal reflux disease)   . Heart attack ([place])   . Hyperlipidemia   . Hypertension   . Presence of permanent cardiac pacemaker    and defibrillator  . Shortness of breath dyspnea   . Sleep apnea    Past Surgical History:  Procedure Laterality Date  . cardiac stents    . CARDIAC SURGERY     defibrillator and pacemaker  . CORONARY ANGIOPLASTY    . ESOPHAGOGASTRODUODENOSCOPY N/A 09/19/2014   Procedure: ESOPHAGOGASTRODUODENOSCOPY (EGD);  Surgeon: Lucilla Lame, MD;  Location: [facility] ENDOSCOPY;  Service: Endoscopy;  Laterality: N/A;  . ESOPHAGOGASTRODUODENOSCOPY (EGD) WITH PROPOFOL N/A 09/28/2017   Procedure: ESOPHAGOGASTRODUODENOSCOPY (EGD) WITH PROPOFOL;  Surgeon: Virgel Manifold, MD;  Location: [facility] ENDOSCOPY;  Service: Endoscopy;  Laterality: N/A;  . ESOPHAGOGASTRODUODENOSCOPY (EGD) WITH PROPOFOL N/A 04/19/2018   Procedure: ESOPHAGOGASTRODUODENOSCOPY  (EGD) WITH PROPOFOL with Gastric Mapping;  Surgeon: [name], MD;  Location[facility] ENDOSCOPY;  Service: Gastroenterology;  Laterality: N/A;  . ESOPHAGOGASTRODUODENOSCOPY (EGD) WITH PROPOFOL N/A 12/13/2018   Procedure: ESOPHAGOGASTRODUODENOSCOPY (EGD) WITH PROPOFOL;  Surgeon: [name], MD;  Location[facility] ENDOSCOPY;  Service: Gastroenterology;  Laterality: N/A;  . ESOPHAGOGASTRODUODENOSCOPY (EGD) WITH PROPOFOL N/A 03/31/2019   Procedure: ESOPHAGOGASTRODUODENOSCOPY (EGD) WITH PROPOFOL;  Surgeon: Lin Landsman, MD;  Location: [facility] ENDOSCOPY;  Service: Gastroenterology;  Laterality: N/A;   Family History  Problem Relation Age of Onset  . Diabetes Mother   . Heart attack Father 9   Social History   Tobacco Use  . Smoking status: Current Every Day Smoker    Packs/day: 0.25    Years: 40.00    Pack years: 10.00    Types: Cigarettes  . Smokeless tobacco: Former Network engineer Use Topics  . Alcohol use: No   No Known Allergies  Prior to Admission medications   Medication Sig Start Date End Date Taking? Authorizing Provider  albuterol (PROVENTIL HFA;VENTOLIN HFA) 108 (90 Base) MCG/ACT inhaler Inhale 2 puffs into the lungs every 6 (six) hours as needed for wheezing or shortness of breath. 05/26/17  Yes Sainani, Belia Heman, MD  atorvastatin (LIPITOR) 40 MG tablet TAKE 1 TABLET (40 MG TOTAL) BY MOUTH DAILY. 03/08/18  Yes Cannady, Jolene T, NP  budesonide-formoterol (SYMBICORT) 160-4.5 MCG/ACT inhaler Inhale 2 puffs into the lungs 2 (two) times daily. 06/15/15  Yes Kathrine Haddock, NP  buPROPion (WELLBUTRIN) 75 MG tablet Take 75 mg by mouth daily. 03/26/16  07/26/19 Yes [provider]  clopidogrel (PLAVIX) 75 MG tablet TAKE 1 TABLET (75 MG TOTAL) BY MOUTH DAILY. 12/08/16  Yes Kathrine Haddock, NP  Dulaglutide (TRULICITY) 1.74 YC/1.4GY SOPN Inject into the skin once a week.   Yes [provider]  ENTRESTO 49-51 MG TAKE 1 TABLET BY MOUTH TWICE A DAY 06/21/19  Yes Darylene Price A, FNP   furosemide (LASIX) 40 MG tablet Take 1 tablet (40 mg total) by mouth daily. Patient taking differently: Take 40 mg by mouth 2 (two) times daily.  08/11/17  Yes Wieting, Richard, MD  glimepiride (AMARYL) 4 MG tablet Take 4 mg by mouth daily with breakfast. 12/01/18  Yes [provider]  ipratropium-albuterol (DUONEB) 0.5-2.5 (3) MG/3ML SOLN Take 3 mLs by nebulization every 6 (six) hours as needed (wheezing; shortness of breath).   Yes [provider]  levocetirizine (XYZAL) 5 MG tablet Take 5 mg by mouth daily. 11/10/18  Yes [provider]  metFORMIN (GLUCOPHAGE) 1000 MG tablet TAKE 1 TABLET (1,000 MG TOTAL) BY MOUTH 2 (TWO) TIMES DAILY WITH A MEAL. 09/12/16  Yes Kathrine Haddock, NP  metoprolol succinate (TOPROL-XL) 100 MG 24 hr tablet Take 1 tablet (100 mg total) by mouth daily. 06/28/19  Yes Alisa Graff, FNP  ONE TOUCH ULTRA TEST test strip USE TO TEST BLOOD SUGAR ONCE DAILY 10/07/16  Yes Johnson, Megan P, DO  pantoprazole (PROTONIX) 40 MG tablet Take 1 tablet (40 mg total) by mouth 2 (two) times daily before a meal. 12/13/18 07/26/19 Yes Vanga, Tally Due, MD  spironolactone (ALDACTONE) 25 MG tablet TAKE 1/2 TABLET BY MOUTH EVERY DAY 05/02/19  Yes Darylene Price A, FNP  sucralfate (CARAFATE) 1 g tablet Take 1 g by mouth 4 (four) times daily -  with meals and at bedtime.  05/15/17 07/26/19 Yes [provider]     Review of Systems  Constitutional: Negative for appetite change, fatigue and fever.  HENT: Negative for congestion, postnasal drip and sore throat.   Eyes: Negative.   Respiratory: Negative for cough, chest tightness, shortness of breath and wheezing.   Cardiovascular: Negative for chest pain, palpitations and leg swelling.  Gastrointestinal: Negative for abdominal distention and abdominal pain.  Endocrine: Negative.   Genitourinary: Negative.   Musculoskeletal: Negative for back pain and neck pain.  Skin: Negative.   Allergic/Immunologic: Negative.    Neurological: Negative for dizziness, light-headedness and headaches.  Hematological: Negative for adenopathy. Does not bruise/bleed easily.  Psychiatric/Behavioral: Negative for dysphoric mood and sleep disturbance (wearing CPAP; sleeping on 2 pillows). The patient is not nervous/anxious.    Vitals:   07/26/19 0922  BP: 107/75  Pulse: 77  Resp: 18  SpO2: 97%  Weight: 154 lb 3.2 oz (69.9 kg)  Height: 5\' 4"  (1.626 m)   Wt Readings from Last 3 Encounters:  07/26/19 154 lb 3.2 oz (69.9 kg)  06/28/19 156 lb (70.8 kg)  03/31/19 133 lb (60.3 kg)   Lab Results  Component Value Date   CREATININE 0.91 02/08/2019   CREATININE 1.14 11/19/2018   CREATININE 0.86 11/18/2018     Physical Exam  Constitutional: He is oriented to person, place, and time. He appears well-developed and well-nourished.  HENT:  Head: Normocephalic and atraumatic.  Neck: No JVD present.  Cardiovascular: Normal rate and regular rhythm.  Pulmonary/Chest: Effort normal. He has no wheezes. He has no rhonchi. He has no rales.  Abdominal: Soft. He exhibits no distension. There is no abdominal tenderness.  Musculoskeletal:  General: No tenderness or edema.     Cervical back: Normal range of motion and neck supple.  Neurological: He is alert and oriented to person, place, and time.  Skin: Skin is warm and dry.  Psychiatric: He has a normal mood and affect. His behavior is normal. Thought content normal.  Nursing note and vitals reviewed.  Assessment & Plan:  1: Chronic heart failure with reduced ejection fraction- - NYHA class I - euvolemic today - weighing daily; reminded to call for an overnight weight gain of >2 pounds or a weekly weight gain of >5 pounds.  - weight down 2 pounds from last visit here 1 month ago - not adding salt; using Mrs. Dash; admits that he's not reading food labels; reminded him to keep his daily sodium intake to 2000mg  a day. Does eat quite a bit of processed food; rinses canned  vegetables out - saw cardiology Clayborn Bigness) 05/23/19 - saw pulmonologist Raul Del) 11/11/2018 - BNP 08/09/17 was 756.0 - currently has pacemaker/defibrillator  - metoprolol succinate 100mg  daily resumed at last visit without known side effects; consider titrating up metoprolol or entresto at future visits if able - reports receiving his flu vaccine for this season  2: HTN- - BP looks good today - saw PCP ([facility]) 05/20/19 - BMP 05/20/19 reviewed and showed sodium 139, potassium 4.4, creatinine 1.2 and GFR 61  3: Diabetes- - saw endocrinology Honor Junes) 05/26/19 - fasting glucose in clinic today was 242 - A1c 05/20/19 was 8.7%  4: Tobacco use- - currently smoking 2-3 cigarettes daily  - complete cessation discussed for 3 minutes with patient   Medication bottles reviewed.   Return in 1 month or sooner for any questions/problesm before then.

## 2019-07-26 ENCOUNTER — Ambulatory Visit: Payer: Medicare Other | Attending: Family | Admitting: Family

## 2019-07-26 ENCOUNTER — Other Ambulatory Visit: Payer: Self-pay

## 2019-07-26 ENCOUNTER — Encounter: Payer: Self-pay | Admitting: Family

## 2019-07-26 VITALS — BP 107/75 | HR 77 | Resp 18 | Ht 64.0 in | Wt 154.2 lb

## 2019-07-26 DIAGNOSIS — Z79899 Other long term (current) drug therapy: Secondary | ICD-10-CM | POA: Diagnosis not present

## 2019-07-26 DIAGNOSIS — Z8249 Family history of ischemic heart disease and other diseases of the circulatory system: Secondary | ICD-10-CM | POA: Diagnosis not present

## 2019-07-26 DIAGNOSIS — I2510 Atherosclerotic heart disease of native coronary artery without angina pectoris: Secondary | ICD-10-CM | POA: Diagnosis not present

## 2019-07-26 DIAGNOSIS — Z950 Presence of cardiac pacemaker: Secondary | ICD-10-CM | POA: Insufficient documentation

## 2019-07-26 DIAGNOSIS — I252 Old myocardial infarction: Secondary | ICD-10-CM | POA: Diagnosis not present

## 2019-07-26 DIAGNOSIS — K219 Gastro-esophageal reflux disease without esophagitis: Secondary | ICD-10-CM | POA: Diagnosis not present

## 2019-07-26 DIAGNOSIS — I10 Essential (primary) hypertension: Secondary | ICD-10-CM

## 2019-07-26 DIAGNOSIS — F17210 Nicotine dependence, cigarettes, uncomplicated: Secondary | ICD-10-CM | POA: Diagnosis not present

## 2019-07-26 DIAGNOSIS — Z955 Presence of coronary angioplasty implant and graft: Secondary | ICD-10-CM | POA: Insufficient documentation

## 2019-07-26 DIAGNOSIS — J449 Chronic obstructive pulmonary disease, unspecified: Secondary | ICD-10-CM | POA: Insufficient documentation

## 2019-07-26 DIAGNOSIS — E785 Hyperlipidemia, unspecified: Secondary | ICD-10-CM | POA: Diagnosis not present

## 2019-07-26 DIAGNOSIS — I110 Hypertensive heart disease with heart failure: Secondary | ICD-10-CM | POA: Insufficient documentation

## 2019-07-26 DIAGNOSIS — Z7984 Long term (current) use of oral hypoglycemic drugs: Secondary | ICD-10-CM | POA: Diagnosis not present

## 2019-07-26 DIAGNOSIS — Z720 Tobacco use: Secondary | ICD-10-CM

## 2019-07-26 DIAGNOSIS — I5022 Chronic systolic (congestive) heart failure: Secondary | ICD-10-CM | POA: Diagnosis present

## 2019-07-26 DIAGNOSIS — E119 Type 2 diabetes mellitus without complications: Secondary | ICD-10-CM | POA: Insufficient documentation

## 2019-07-26 LAB — GLUCOSE, CAPILLARY: Glucose-Capillary: 242 mg/dL — ABNORMAL HIGH (ref 70–99)

## 2019-07-26 NOTE — Patient Instructions (Addendum)
Resume weighing daily and call for an overnight weight gain of > 2 pounds or a weekly weight gain of >5 pounds. 

## 2019-08-27 NOTE — Progress Notes (Signed)
Patient ID: John Frank, male    DOB: Mar 05, 1956, 64 y.o.   MRN: [id number]  HPI  John Frank is a 64 y/o male with a history of CAD, DM, hyperlipidemia, HTN, GERD, COPD, obstructive sleep apnea, chronic heart failure and current tobacco use.   Echo report from 11/19/2018 reviewed and showed an EF of 40-45%. Echo report from 04/13/17 reviewed and showed an EF of 30% along with mild John/TR.  Was in the ED 02/08/2019 but left without being seen.   He presents today with a chief complaint of a follow-up visit. He currently denies any symptoms and specifically denies any difficulty sleeping, dizziness, abdominal distention, palpitations, pedal edema, chest pain, wheezing, shortness of breath, cough, fatigue or weight gain.   Did not bring his medications with him as he says that he left them in the car. Says that nothing has changed since he was last here.   Past Medical History:  Diagnosis Date  . CAD (coronary artery disease)    s/p stents in 2017  . Chronic systolic CHF (congestive heart failure) ([place])   . COPD (chronic obstructive pulmonary disease) ([place])   . Diabetes ([place])   . GERD (gastroesophageal reflux disease)   . Heart attack ([place])   . Hyperlipidemia   . Hypertension   . Presence of permanent cardiac pacemaker    and defibrillator  . Shortness of breath dyspnea   . Sleep apnea    Past Surgical History:  Procedure Laterality Date  . cardiac stents    . CARDIAC SURGERY     defibrillator and pacemaker  . CORONARY ANGIOPLASTY    . ESOPHAGOGASTRODUODENOSCOPY N/A 09/19/2014   Procedure: ESOPHAGOGASTRODUODENOSCOPY (EGD);  Surgeon: Lucilla Lame, MD;  [facility]: [facility] ENDOSCOPY;  Service: Endoscopy;  Laterality: N/A;  . ESOPHAGOGASTRODUODENOSCOPY (EGD) WITH PROPOFOL N/A 09/28/2017   Procedure: ESOPHAGOGASTRODUODENOSCOPY (EGD) WITH PROPOFOL;  Surgeon: Virgel Manifold, MD;  Location: [facility] ENDOSCOPY;  Service: Endoscopy;  Laterality: N/A;  . ESOPHAGOGASTRODUODENOSCOPY (EGD) WITH  PROPOFOL N/A 04/19/2018   Procedure: ESOPHAGOGASTRODUODENOSCOPY (EGD) WITH PROPOFOL with Gastric Mapping;  Surgeon: [name], MD;  Location: [facility] ENDOSCOPY;  Service: Gastroenterology;  Laterality: N/A;  . ESOPHAGOGASTRODUODENOSCOPY (EGD) WITH PROPOFOL N/A 12/13/2018   Procedure: ESOPHAGOGASTRODUODENOSCOPY (EGD) WITH PROPOFOL;  Surgeon: [name], MD;  Location: [facility] ENDOSCOPY;  Service: Gastroenterology;  Laterality: N/A;  . ESOPHAGOGASTRODUODENOSCOPY (EGD) WITH PROPOFOL N/A 03/31/2019   Procedure: ESOPHAGOGASTRODUODENOSCOPY (EGD) WITH PROPOFOL;  Surgeon: Lin Landsman, MD;  Location: [facility] ENDOSCOPY;  Service: Gastroenterology;  Laterality: N/A;   Family History  Problem Relation Age of Onset  . Diabetes Mother   . Heart attack Father 18   Social History   Tobacco Use  . Smoking status: Current Every Day Smoker    Packs/day: 0.25    Years: 40.00    Pack years: 10.00    Types: Cigarettes  . Smokeless tobacco: Former Network engineer Use Topics  . Alcohol use: No   No Known Allergies  Prior to Admission medications   Medication Sig Start Date End Date Taking? Authorizing Provider  albuterol (PROVENTIL HFA;VENTOLIN HFA) 108 (90 Base) MCG/ACT inhaler Inhale 2 puffs into the lungs every 6 (six) hours as needed for wheezing or shortness of breath. 05/26/17  Yes Sainani, Belia Heman, MD  atorvastatin (LIPITOR) 40 MG tablet TAKE 1 TABLET (40 MG TOTAL) BY MOUTH DAILY. 03/08/18  Yes Cannady, Jolene T, NP  budesonide-formoterol (SYMBICORT) 160-4.5 MCG/ACT inhaler Inhale 2 puffs into the lungs 2 (two) times daily. 06/15/15  Yes  Kathrine Haddock, NP  buPROPion (WELLBUTRIN) 75 MG tablet Take 75 mg by mouth daily. 03/26/16 08/29/19 Yes [provider]  clopidogrel (PLAVIX) 75 MG tablet TAKE 1 TABLET (75 MG TOTAL) BY MOUTH DAILY. 12/08/16  Yes Kathrine Haddock, NP  Dulaglutide (TRULICITY) 5.36 UY/4.0HK SOPN Inject into the skin once a week.   Yes [provider]  ENTRESTO 49-51 MG TAKE  1 TABLET BY MOUTH TWICE A DAY 06/21/19  Yes Darylene Price A, FNP  furosemide (LASIX) 40 MG tablet Take 1 tablet (40 mg total) by mouth daily. Patient taking differently: Take 40 mg by mouth 2 (two) times daily.  08/11/17  Yes Wieting, Richard, MD  glimepiride (AMARYL) 4 MG tablet Take 4 mg by mouth daily with breakfast. 12/01/18  Yes [provider]  ipratropium-albuterol (DUONEB) 0.5-2.5 (3) MG/3ML SOLN Take 3 mLs by nebulization every 6 (six) hours as needed (wheezing; shortness of breath).   Yes [provider]  levocetirizine (XYZAL) 5 MG tablet Take 5 mg by mouth daily. 11/10/18  Yes [provider]  metFORMIN (GLUCOPHAGE) 1000 MG tablet TAKE 1 TABLET (1,000 MG TOTAL) BY MOUTH 2 (TWO) TIMES DAILY WITH A MEAL. 09/12/16  Yes Kathrine Haddock, NP  metoprolol succinate (TOPROL-XL) 100 MG 24 hr tablet Take 1 tablet (100 mg total) by mouth daily. 06/28/19  Yes Alisa Graff, FNP  ONE TOUCH ULTRA TEST test strip USE TO TEST BLOOD SUGAR ONCE DAILY 10/07/16  Yes Johnson, Megan P, DO  pantoprazole (PROTONIX) 40 MG tablet Take 1 tablet (40 mg total) by mouth 2 (two) times daily before a meal. 12/13/18 08/29/19 Yes Vanga, Tally Due, MD  spironolactone (ALDACTONE) 25 MG tablet TAKE 1/2 TABLET BY MOUTH EVERY DAY 05/02/19  Yes Darylene Price A, FNP  sucralfate (CARAFATE) 1 g tablet Take 1 g by mouth 4 (four) times daily -  with meals and at bedtime.  05/15/17 08/29/19 Yes [provider]    Review of Systems  Constitutional: Negative for appetite change, fatigue and fever.  HENT: Negative for congestion, postnasal drip and sore throat.   Eyes: Negative.   Respiratory: Negative for cough, chest tightness, shortness of breath and wheezing.   Cardiovascular: Negative for chest pain, palpitations and leg swelling.  Gastrointestinal: Negative for abdominal distention and abdominal pain.  Endocrine: Negative.   Genitourinary: Negative.   Musculoskeletal: Negative for back pain and neck  pain.  Skin: Negative.   Allergic/Immunologic: Negative.   Neurological: Negative for dizziness, light-headedness and headaches.  Hematological: Negative for adenopathy. Does not bruise/bleed easily.  Psychiatric/Behavioral: Negative for dysphoric mood and sleep disturbance (wearing CPAP; sleeping on 2 pillows). The patient is not nervous/anxious.    Vitals:   08/29/19 1250  BP: 104/61  Pulse: 63  Resp: 18  SpO2: 92%  Weight: 154 lb (69.9 kg)  Height: 5\' 4"  (1.626 m)   Wt Readings from Last 3 Encounters:  08/29/19 154 lb (69.9 kg)  07/26/19 154 lb 3.2 oz (69.9 kg)  06/28/19 156 lb (70.8 kg)   Lab Results  Component Value Date   CREATININE 0.91 02/08/2019   CREATININE 1.14 11/19/2018   CREATININE 0.86 11/18/2018    Physical Exam  Constitutional: He is oriented to person, place, and time. He appears well-developed and well-nourished.  HENT:  Head: Normocephalic and atraumatic.  Neck: No JVD present.  Cardiovascular: Normal rate and regular rhythm.  Pulmonary/Chest: Effort normal. He has wheezes (expiratory in lower lung fields). He has no rhonchi. He has no rales.  Abdominal:  Soft. He exhibits no distension. There is no abdominal tenderness.  Musculoskeletal:        General: No tenderness or edema.     Cervical back: Normal range of motion and neck supple.  Neurological: He is alert and oriented to person, place, and time.  Skin: Skin is warm and dry.  Psychiatric: He has a normal mood and affect. His behavior is normal. Thought content normal.  Nursing note and vitals reviewed.  Assessment & Plan:  1: Chronic heart failure with reduced ejection fraction- - NYHA class I - euvolemic today - weighing daily; reminded to call for an overnight weight gain of >2 pounds or a weekly weight gain of >5 pounds.  - weight unchanged from last visit here 1 month ago - not adding salt; using Mrs. Dash; admits that he's not reading food labels; reminded him to keep his daily sodium  intake to 2000mg  a day. Does eat quite a bit of processed food; rinses canned vegetables out - saw cardiology Clayborn Bigness) 05/23/19 - saw pulmonologist Raul Del) 11/11/2018 - BNP 08/09/17 was 756.0 - currently has pacemaker/defibrillator   2: HTN- - BP looks good today although too low to titrate medications - saw PCP Doy Hutching) 05/20/19 & return later this week - BMP 05/20/19 reviewed and showed sodium 139, potassium 4.4, creatinine 1.2 and GFR 61  3: Diabetes- - saw endocrinology Honor Junes) 05/26/19 - nonfasting glucose in clinic today was 153 - A1c 05/20/19 was 8.7%  4: Tobacco use- - currently smoking 2-3 cigarettes daily; more if he's stressed or upset - complete cessation discussed for 3 minutes with patient   Patient did not bring his medications nor a list. Each medication was verbally reviewed with the patient and he was encouraged to bring the bottles to every visit to confirm accuracy of list.  Return in 6 months or sooner for any questions/problems before then.

## 2019-08-29 ENCOUNTER — Other Ambulatory Visit: Payer: Self-pay

## 2019-08-29 ENCOUNTER — Encounter: Payer: Self-pay | Admitting: Family

## 2019-08-29 ENCOUNTER — Ambulatory Visit: Payer: Medicare Other | Attending: Family | Admitting: Family

## 2019-08-29 VITALS — BP 104/61 | HR 63 | Resp 18 | Ht 64.0 in | Wt 154.0 lb

## 2019-08-29 DIAGNOSIS — Z79899 Other long term (current) drug therapy: Secondary | ICD-10-CM | POA: Diagnosis not present

## 2019-08-29 DIAGNOSIS — Z7951 Long term (current) use of inhaled steroids: Secondary | ICD-10-CM | POA: Insufficient documentation

## 2019-08-29 DIAGNOSIS — E785 Hyperlipidemia, unspecified: Secondary | ICD-10-CM | POA: Insufficient documentation

## 2019-08-29 DIAGNOSIS — I110 Hypertensive heart disease with heart failure: Secondary | ICD-10-CM | POA: Insufficient documentation

## 2019-08-29 DIAGNOSIS — I5022 Chronic systolic (congestive) heart failure: Secondary | ICD-10-CM | POA: Insufficient documentation

## 2019-08-29 DIAGNOSIS — K219 Gastro-esophageal reflux disease without esophagitis: Secondary | ICD-10-CM | POA: Diagnosis not present

## 2019-08-29 DIAGNOSIS — E119 Type 2 diabetes mellitus without complications: Secondary | ICD-10-CM | POA: Diagnosis not present

## 2019-08-29 DIAGNOSIS — Z7984 Long term (current) use of oral hypoglycemic drugs: Secondary | ICD-10-CM | POA: Insufficient documentation

## 2019-08-29 DIAGNOSIS — Z833 Family history of diabetes mellitus: Secondary | ICD-10-CM | POA: Diagnosis not present

## 2019-08-29 DIAGNOSIS — Z720 Tobacco use: Secondary | ICD-10-CM

## 2019-08-29 DIAGNOSIS — Z8249 Family history of ischemic heart disease and other diseases of the circulatory system: Secondary | ICD-10-CM | POA: Diagnosis not present

## 2019-08-29 DIAGNOSIS — Z95810 Presence of automatic (implantable) cardiac defibrillator: Secondary | ICD-10-CM | POA: Insufficient documentation

## 2019-08-29 DIAGNOSIS — Z955 Presence of coronary angioplasty implant and graft: Secondary | ICD-10-CM | POA: Insufficient documentation

## 2019-08-29 DIAGNOSIS — I10 Essential (primary) hypertension: Secondary | ICD-10-CM

## 2019-08-29 DIAGNOSIS — G4733 Obstructive sleep apnea (adult) (pediatric): Secondary | ICD-10-CM | POA: Diagnosis not present

## 2019-08-29 DIAGNOSIS — I2510 Atherosclerotic heart disease of native coronary artery without angina pectoris: Secondary | ICD-10-CM | POA: Diagnosis not present

## 2019-08-29 DIAGNOSIS — I252 Old myocardial infarction: Secondary | ICD-10-CM | POA: Diagnosis not present

## 2019-08-29 DIAGNOSIS — J449 Chronic obstructive pulmonary disease, unspecified: Secondary | ICD-10-CM | POA: Diagnosis not present

## 2019-08-29 DIAGNOSIS — F17210 Nicotine dependence, cigarettes, uncomplicated: Secondary | ICD-10-CM | POA: Insufficient documentation

## 2019-08-29 LAB — GLUCOSE, CAPILLARY: Glucose-Capillary: 153 mg/dL — ABNORMAL HIGH (ref 70–99)

## 2019-08-29 NOTE — Patient Instructions (Signed)
Continue weighing daily and call for an overnight weight gain of > 2 pounds or a weekly weight gain of >5 pounds. 

## 2019-09-06 ENCOUNTER — Other Ambulatory Visit: Payer: Self-pay

## 2019-09-06 ENCOUNTER — Encounter: Payer: Self-pay | Admitting: *Deleted

## 2019-09-06 DIAGNOSIS — Z7984 Long term (current) use of oral hypoglycemic drugs: Secondary | ICD-10-CM | POA: Insufficient documentation

## 2019-09-06 DIAGNOSIS — Z79899 Other long term (current) drug therapy: Secondary | ICD-10-CM | POA: Insufficient documentation

## 2019-09-06 DIAGNOSIS — R519 Headache, unspecified: Secondary | ICD-10-CM | POA: Diagnosis present

## 2019-09-06 DIAGNOSIS — I2510 Atherosclerotic heart disease of native coronary artery without angina pectoris: Secondary | ICD-10-CM | POA: Diagnosis not present

## 2019-09-06 DIAGNOSIS — R112 Nausea with vomiting, unspecified: Secondary | ICD-10-CM | POA: Insufficient documentation

## 2019-09-06 DIAGNOSIS — I5022 Chronic systolic (congestive) heart failure: Secondary | ICD-10-CM | POA: Insufficient documentation

## 2019-09-06 DIAGNOSIS — F17210 Nicotine dependence, cigarettes, uncomplicated: Secondary | ICD-10-CM | POA: Diagnosis not present

## 2019-09-06 DIAGNOSIS — I110 Hypertensive heart disease with heart failure: Secondary | ICD-10-CM | POA: Diagnosis not present

## 2019-09-06 DIAGNOSIS — Z950 Presence of cardiac pacemaker: Secondary | ICD-10-CM | POA: Diagnosis not present

## 2019-09-06 DIAGNOSIS — J449 Chronic obstructive pulmonary disease, unspecified: Secondary | ICD-10-CM | POA: Insufficient documentation

## 2019-09-06 LAB — COMPREHENSIVE METABOLIC PANEL
ALT: 27 U/L (ref 0–44)
AST: 28 U/L (ref 15–41)
Albumin: 4.5 g/dL (ref 3.5–5.0)
Alkaline Phosphatase: 139 U/L — ABNORMAL HIGH (ref 38–126)
Anion gap: 11 (ref 5–15)
BUN: 18 mg/dL (ref 8–23)
CO2: 28 mmol/L (ref 22–32)
Calcium: 9.5 mg/dL (ref 8.9–10.3)
Chloride: 100 mmol/L (ref 98–111)
Creatinine, Ser: 1.04 mg/dL (ref 0.61–1.24)
GFR calc Af Amer: >60 mL/min (ref >60)
GFR calc non Af Amer: >60 mL/min (ref >60)
Glucose, Bld: 142 mg/dL — ABNORMAL HIGH (ref 70–99)
Potassium: 4.3 mmol/L (ref 3.5–5.1)
Sodium: 139 mmol/L (ref 135–145)
Total Bilirubin: 1.4 mg/dL — ABNORMAL HIGH (ref 0.3–1.2)
Total Protein: 8 g/dL (ref 6.5–8.1)

## 2019-09-06 LAB — CBC
HCT: 44.9 % (ref 39.0–52.0)
Hemoglobin: 14.8 g/dL (ref 13.0–17.0)
MCH: 30.8 pg (ref 26.0–34.0)
MCHC: 33 g/dL (ref 30.0–36.0)
MCV: 93.3 fL (ref 80.0–100.0)
Platelets: 293 10*3/uL (ref 150–400)
RBC: 4.81 MIL/uL (ref 4.22–5.81)
RDW: 17.2 % — ABNORMAL HIGH (ref 11.5–15.5)
WBC: 9.3 10*3/uL (ref 4.0–10.5)
nRBC: 0 % (ref 0.0–0.2)

## 2019-09-06 LAB — LIPASE, BLOOD: Lipase: 21 U/L (ref 11–51)

## 2019-09-06 LAB — TROPONIN I (HIGH SENSITIVITY): Troponin I (High Sensitivity): 8 ng/L (ref <18)

## 2019-09-06 MED ORDER — ONDANSETRON 4 MG PO TBDP
4.0000 mg | ORAL_TABLET | Freq: Once | ORAL | Status: AC | PRN
  Administered 2019-09-06: 4 mg via ORAL
  Filled 2019-09-06: qty 1

## 2019-09-06 NOTE — ED Triage Notes (Signed)
Pt to ED reporting NV for the past couple days with dizziness upon standing and a headache after decreased PO intake. Pt denies diarrhea.   Pt reports pain in his chest for the past couple days with a hx of a pacemaker. No SOB at this time.

## 2019-09-07 ENCOUNTER — Emergency Department: Payer: Medicare Other

## 2019-09-07 ENCOUNTER — Emergency Department
Admission: EM | Admit: 2019-09-07 | Discharge: 2019-09-07 | Disposition: A | Discharge status: Home / Self Care | Payer: Medicare Other | Attending: Emergency Medicine | Admitting: Emergency Medicine

## 2019-09-07 DIAGNOSIS — R112 Nausea with vomiting, unspecified: Secondary | ICD-10-CM

## 2019-09-07 LAB — TROPONIN I (HIGH SENSITIVITY): Troponin I (High Sensitivity): 8 ng/L (ref <18)

## 2019-09-07 LAB — URINALYSIS, COMPLETE (UACMP) WITH MICROSCOPIC
Bacteria, UA: NONE SEEN
Bilirubin Urine: NEGATIVE
Glucose, UA: >=500 mg/dL — AB
Hgb urine dipstick: NEGATIVE
Ketones, ur: 5 mg/dL — AB
Leukocytes,Ua: NEGATIVE
Nitrite: NEGATIVE
Protein, ur: NEGATIVE mg/dL
Specific Gravity, Urine: 1.035 — ABNORMAL HIGH (ref 1.005–1.030)
Squamous Epithelial / HPF: NONE SEEN (ref 0–5)
pH: 5 (ref 5.0–8.0)

## 2019-09-07 MED ORDER — IPRATROPIUM-ALBUTEROL 0.5-2.5 (3) MG/3ML IN SOLN
3.0000 mL | Freq: Once | RESPIRATORY_TRACT | Status: AC
  Administered 2019-09-07: 3 mL via RESPIRATORY_TRACT
  Filled 2019-09-07: qty 3

## 2019-09-07 MED ORDER — ONDANSETRON 4 MG PO TBDP: 4.0000 mg | ORAL_TABLET | Freq: Three times a day (TID) | ORAL | 0 refills | Status: DC | PRN

## 2019-09-07 MED ORDER — IPRATROPIUM-ALBUTEROL 0.5-2.5 (3) MG/3ML IN SOLN
3.0000 mL | Freq: Once | RESPIRATORY_TRACT | Status: AC
  Administered 2019-09-07: 02:00:00 3 mL via RESPIRATORY_TRACT
  Filled 2019-09-07: qty 3

## 2019-09-07 NOTE — ED Notes (Signed)
Pt states he has had N/V for last "couple days". Currently denies pain at this time. Pt states he received zofran while in triage and N/V have stopped.

## 2019-09-07 NOTE — ED Provider Notes (Signed)
[facility] Emergency Department Provider Note  ____________________________________________  Time seen: Approximately 2:43 AM  I have reviewed the triage vital signs and the nursing notes.   HISTORY  Chief Complaint Headache and Emesis   HPI John Frank is a 64 y.o. male history of CAD status post stents, CHF the EF of 45%, COPD, diabetes, GERD, hypertension, hyperlipidemia, AICD, cirrhosis of the liver, smoking who presents for evaluation of nausea and vomiting.  According to patient's wife, 2 days ago he drank spoiled orange juice.  Since then patient has had nausea and vomiting.  He reports vomiting once a day for the last 2 to 3 days, NBNB, no coffee ground emesis.  He still able to tolerate p.o.  Has had nausea and decreased appetite for the last 2 days.  Initially had some pressure-like epigastric abdominal pain however that resolved.   He reports that sometimes his breath smells like garbage.  He also complained that earlier today he had a headache.  According to his wife, patient has a history of chronic headaches and has been pretty often.  The headache has resolved without intervention.  He denies changes in his chronic cough from smoking, fever, loss of taste or smell, diarrhea, chest pain, shortness of breath.  Currently has no abdominal pain.  Reports that his nausea subsided after receiving Zofran in the waiting room.  No exposures to Covid.  No recent travel.  Past Medical History:  Diagnosis Date  . CAD (coronary artery disease)    s/p stents in 2017  . Chronic systolic CHF (congestive heart failure) ([place])   . COPD (chronic obstructive pulmonary disease) ([place])   . Diabetes ([place])   . GERD (gastroesophageal reflux disease)   . Heart attack ([place])   . Hyperlipidemia   . Hypertension   . Presence of permanent cardiac pacemaker    and defibrillator  . Shortness of breath dyspnea   . Sleep apnea     Patient Active Problem List   Diagnosis  Date Noted  . Ulcer of esophagus without bleeding   . Gastritis with intestinal metaplasia of stomach   . COPD with acute exacerbation ([place]) 05/25/2018  . Iron deficiency anemia   . Cirrhosis of liver with ascites ([place])   . Chronic systolic heart failure ([place]) 08/31/2017  . CHF exacerbation ([place]) 08/09/2017  . Syncope 01/11/2016  . Tobacco abuse 04/06/2015  . HTN (hypertension) 11/03/2014  . Sleep apnea 10/26/2014  . CAD (coronary artery disease) 10/26/2014  . GERD (gastroesophageal reflux disease) 10/26/2014  . Polyp of colon 10/26/2014  . Diabetes mellitus without complication ([place]) 40/97/3532  . Hyperlipidemia 10/26/2014  . COPD, severe ([place]) 10/26/2014  . Status cardiac pacemaker 10/26/2014  . Lesion of nasal septum 10/26/2014    Past Surgical History:  Procedure Laterality Date  . cardiac stents    . CARDIAC SURGERY     defibrillator and pacemaker  . CORONARY ANGIOPLASTY    . ESOPHAGOGASTRODUODENOSCOPY N/A 09/19/2014   Procedure: ESOPHAGOGASTRODUODENOSCOPY (EGD);  Surgeon: Lucilla Lame, MD;  Location: [facility] ENDOSCOPY;  Service: Endoscopy;  Laterality: N/A;  . ESOPHAGOGASTRODUODENOSCOPY (EGD) WITH PROPOFOL N/A 09/28/2017   Procedure: ESOPHAGOGASTRODUODENOSCOPY (EGD) WITH PROPOFOL;  Surgeon: Virgel Manifold, MD;  Location: [facility] ENDOSCOPY;  Service: Endoscopy;  Laterality: N/A;  . ESOPHAGOGASTRODUODENOSCOPY (EGD) WITH PROPOFOL N/A 04/19/2018   Procedure: ESOPHAGOGASTRODUODENOSCOPY (EGD) WITH PROPOFOL with Gastric Mapping;  Surgeon: [name], MD;  Location: [facility] ENDOSCOPY;  Service: Gastroenterology;  Laterality: N/A;  . ESOPHAGOGASTRODUODENOSCOPY (EGD) WITH PROPOFOL  N/A 12/13/2018   Procedure: ESOPHAGOGASTRODUODENOSCOPY (EGD) WITH PROPOFOL;  Surgeon: [name], MD;  Location: [facility] ENDOSCOPY;  Service: Gastroenterology;  Laterality: N/A;  . ESOPHAGOGASTRODUODENOSCOPY (EGD) WITH PROPOFOL N/A 03/31/2019   Procedure: ESOPHAGOGASTRODUODENOSCOPY (EGD) WITH PROPOFOL;  Surgeon:  Lin Landsman, MD;  Location: [facility] ENDOSCOPY;  Service: Gastroenterology;  Laterality: N/A;    Prior to Admission medications   Medication Sig Start Date End Date Taking? Authorizing Provider  albuterol (PROVENTIL HFA;VENTOLIN HFA) 108 (90 Base) MCG/ACT inhaler Inhale 2 puffs into the lungs every 6 (six) hours as needed for wheezing or shortness of breath. 05/26/17   Henreitta Leber, MD  atorvastatin (LIPITOR) 40 MG tablet TAKE 1 TABLET (40 MG TOTAL) BY MOUTH DAILY. 03/08/18   Cannady, Henrine Screws T, NP  budesonide-formoterol (SYMBICORT) 160-4.5 MCG/ACT inhaler Inhale 2 puffs into the lungs 2 (two) times daily. 06/15/15   Kathrine Haddock, NP  buPROPion (WELLBUTRIN) 75 MG tablet Take 75 mg by mouth daily. 03/26/16 08/29/19  [provider]  clopidogrel (PLAVIX) 75 MG tablet TAKE 1 TABLET (75 MG TOTAL) BY MOUTH DAILY. 12/08/16   Kathrine Haddock, NP  Dulaglutide (TRULICITY) 6.27 OJ/5.0KX SOPN Inject into the skin once a week.    [provider]  ENTRESTO 49-51 MG TAKE 1 TABLET BY MOUTH TWICE A DAY 06/21/19   Darylene Price A, FNP  furosemide (LASIX) 40 MG tablet Take 1 tablet (40 mg total) by mouth daily. Patient taking differently: Take 40 mg by mouth 2 (two) times daily.  08/11/17   Loletha Grayer, MD  glimepiride (AMARYL) 4 MG tablet Take 4 mg by mouth daily with breakfast. 12/01/18   [provider]  ipratropium-albuterol (DUONEB) 0.5-2.5 (3) MG/3ML SOLN Take 3 mLs by nebulization every 6 (six) hours as needed (wheezing; shortness of breath).    [provider]  levocetirizine (XYZAL) 5 MG tablet Take 5 mg by mouth daily. 11/10/18   [provider]  metFORMIN (GLUCOPHAGE) 1000 MG tablet TAKE 1 TABLET (1,000 MG TOTAL) BY MOUTH 2 (TWO) TIMES DAILY WITH A MEAL. 09/12/16   Kathrine Haddock, NP  metoprolol succinate (TOPROL-XL) 100 MG 24 hr tablet Take 1 tablet (100 mg total) by mouth daily. 06/28/19   Alisa Graff, FNP  ondansetron (ZOFRAN ODT) 4 MG disintegrating  tablet Take 1 tablet (4 mg total) by mouth every 8 (eight) hours as needed. 09/07/19   Rudene Re, MD  ONE [facility] ULTRA TEST test strip USE TO TEST BLOOD SUGAR ONCE DAILY 10/07/16   Park Liter P, DO  pantoprazole (PROTONIX) 40 MG tablet Take 1 tablet (40 mg total) by mouth 2 (two) times daily before a meal. 12/13/18 08/29/19  Lin Landsman, MD  spironolactone (ALDACTONE) 25 MG tablet TAKE 1/2 TABLET BY MOUTH EVERY DAY 05/02/19   Darylene Price A, FNP  sucralfate (CARAFATE) 1 g tablet Take 1 g by mouth 4 (four) times daily -  with meals and at bedtime.  05/15/17 08/29/19  [provider]    Allergies Patient has no known allergies.  Family History  Problem Relation Age of Onset  . Diabetes Mother   . Heart attack Father 64    Social History Social History   Tobacco Use  . Smoking status: Current Every Day Smoker    Packs/day: 0.25    Years: 40.00    Pack years: 10.00    Types: Cigarettes  . Smokeless tobacco: Former Network engineer Use Topics  . Alcohol use: No  . Drug use: Never  Review of Systems  Constitutional: Negative for fever. Eyes: Negative for visual changes. ENT: Negative for sore throat. Neck: No neck pain  Cardiovascular: Negative for chest pain. Respiratory: Negative for shortness of breath. Gastrointestinal: + abdominal pain and vomiting. No diarrhea. Genitourinary: Negative for dysuria. Musculoskeletal: Negative for back pain. Skin: Negative for rash. Neurological: Negative for headaches, weakness or numbness. Psych: No SI or HI  ____________________________________________   PHYSICAL EXAM:  VITAL SIGNS: ED Triage Vitals  Enc Vitals Group     BP 09/06/19 2151 114/65     Pulse Rate 09/06/19 2151 90     Resp 09/06/19 2151 18     Temp 09/06/19 2151 98 F (36.7 C)     Temp Source 09/06/19 2151 Oral     SpO2 09/06/19 2151 92 %     Weight 09/06/19 2151 135 lb (61.2 kg)     Height 09/06/19 2201 5\' 4"  (1.626 m)     Head  Circumference --      Peak Flow --      Pain Score 09/06/19 2201 0     Pain Loc --      Pain Edu? --      Excl. in [place]? --     Constitutional: Alert and oriented. Well appearing and in no apparent distress. HEENT:      Head: Normocephalic and atraumatic.         Eyes: Conjunctivae are normal. Sclera is non-icteric.       Mouth/Throat: Mucous membranes are moist.       Neck: Supple with no signs of meningismus. Cardiovascular: Regular rate and rhythm. No murmurs, gallops, or rubs.  Respiratory: Normal respiratory effort.  Slight decreased air movement bilaterally, no wheezes, crackles, or rhonchi.  Gastrointestinal: Soft, non tender, and non distended with positive bowel sounds. No rebound or guarding. Musculoskeletal: No edema, cyanosis, or erythema of extremities. Neurologic: Normal speech and language. Face is symmetric. Moving all extremities. No gross focal neurologic deficits are appreciated. Skin: Skin is warm, dry and intact. No rash noted. Psychiatric: Mood and affect are normal. Speech and behavior are normal.  ____________________________________________   LABS (all labs ordered are listed, but only abnormal results are displayed)  Labs Reviewed  COMPREHENSIVE METABOLIC PANEL - Abnormal; Notable for the following components:      Result Value   Glucose, Bld 142 (*)    Alkaline Phosphatase 139 (*)    Total Bilirubin 1.4 (*)    All other components within normal limits  CBC - Abnormal; Notable for the following components:   RDW 17.2 (*)    All other components within normal limits  URINALYSIS, COMPLETE (UACMP) WITH MICROSCOPIC - Abnormal; Notable for the following components:   Color, Urine YELLOW (*)    APPearance HAZY (*)    Specific Gravity, Urine 1.035 (*)    Glucose, UA >=500 (*)    Ketones, ur 5 (*)    All other components within normal limits  LIPASE, BLOOD  TROPONIN I (HIGH SENSITIVITY)  TROPONIN I (HIGH SENSITIVITY)    ____________________________________________  EKG  ED ECG REPORT I, Rudene Re, the attending physician, personally viewed and interpreted this ECG.  Sinus rhythm with frequent PVCs, rate of 88, normal intervals, normal axis, T wave inversions in inferior lateral leads with no ST elevations.  Unchanged from prior. ____________________________________________  RADIOLOGY  I have personally reviewed the images performed during this visit and I agree with the Radiologist's read.   Interpretation by Radiologist:  DG Chest Portable  1 View  Result Date: 09/07/2019 CLINICAL DATA:  Hypoxia EXAM: PORTABLE CHEST 1 VIEW COMPARISON:  02/08/2019 FINDINGS: The heart size and mediastinal contours are within normal limits. Both lungs are clear. The visualized skeletal structures are unremarkable. Unchanged position of left chest wall AICD IMPRESSION: No active disease. Electronically Signed   By: Ulyses Jarred M.D.   On: 09/07/2019 02:32     ____________________________________________   PROCEDURES  Procedure(s) performed:yes .1-3 Lead EKG Interpretation Performed by: Rudene Re, MD Authorized by: Rudene Re, MD     Interpretation: normal     ECG rate:  80   ECG rate assessment: normal     Rhythm: sinus rhythm     Ectopy: PVCs     Conduction: normal     Critical Care performed:  None ____________________________________________   INITIAL IMPRESSION / ASSESSMENT AND PLAN / ED COURSE  64 y.o. male history of CAD status post stents, CHF the EF of 45%, COPD, diabetes, GERD, hypertension, hyperlipidemia, AICD, cirrhosis of the liver, smoking who presents for evaluation of nausea and vomiting for the last few days since drinking a spoiled orange juice.  History gathered from patient and his wife is at bedside.  Old medical records reviewed.  Patient is well-appearing and in no distress, abdomen is soft with no tenderness throughout, looks well-hydrated, afebrile  with normal vital signs.  Patient has episodes where he becomes hypoxic to 88% but that resolves to 95 to 100% without intervention.  Patient reports that he feels like he needs a breathing treatment.  Denies any changes in his chronic cough or shortness of breath.  Will provide a breathing treatment and get a chest x-ray.  Ddx food poisoning, gastritis, GERD, pancreatitis, GB disease.  UA negative for UTI with mild ketones.  Due to history of CHF will hydrate orally.  EKG with no acute ischemic changes.  2 troponins negative.  Normal lipase and LFTs.  No significant electrolyte derangements.  Sugar of 142 with no evidence of DKA.  No AKI.  No leukocytosis, no anemia.  Chest x-ray visualized and read by me as normal.  Confirmed by radiology.  We will give duo nebs as requested per patient and Zofran and p.o. challenge.  _________________________ 3:08 AM on 09/07/2019 -----------------------------------------  Patient reassessment: Patient feels markedly improved, tolerating p.o. no further episodes of vomiting.  No headache, no chest pain, no abdominal pain, no shortness of breath.  Sats are 94% or greater both at rest and with ambulation.  At this time patient is stable for discharge home.  Recommended increase oral hydration, Zofran for nausea, bland diet, and close follow-up with primary care doctor.  Work-up, plan, and return precautions discussed with patient and his wife.  Return to the ER for signs of dehydration, abdominal pain, chest pain, shortness of breath or fever.    _____________________________________________ Please note:  Patient was evaluated in Emergency Department today for the symptoms described in the history of present illness. Patient was evaluated in the context of the global COVID-19 pandemic, which necessitated consideration that the patient might be at risk for infection with the SARS-CoV-2 virus that causes COVID-19. Institutional protocols and algorithms that pertain  to the evaluation of patients at risk for COVID-19 are in a state of rapid change based on information released by regulatory bodies including the CDC and federal and state organizations. These policies and algorithms were followed during the patient's care in the ED.  Some ED evaluations and interventions may be delayed as a result  of limited staffing during the pandemic.   [place] Controlled Substance Database was reviewed by me. ____________________________________________   FINAL CLINICAL IMPRESSION(S) / ED DIAGNOSES   Final diagnoses:  Non-intractable vomiting with nausea, unspecified vomiting type      NEW MEDICATIONS STARTED DURING THIS VISIT:  ED Discharge Orders         Ordered    ondansetron (ZOFRAN ODT) 4 MG disintegrating tablet  Every 8 hours PRN     09/07/19 0251           Note:  This document was prepared using Dragon voice recognition software and may include unintentional dictation errors.    Alfred Levins, [place], MD 09/07/19 [phone]

## 2019-09-07 NOTE — ED Notes (Signed)
Pt ambulated, sats stayed between 94-96% with walking

## 2019-09-07 NOTE — Discharge Instructions (Addendum)
Make sure to increase your oral hydration at home for the next 48 hours.  Eat more of a bland diet.  Take Zofran as needed for nausea.  Return to the emergency room if you have abdominal pain, chest pain, fever, shortness of breath, headache, cough.  Otherwise follow-up with your primary care doctor in 2 days.

## 2019-09-26 ENCOUNTER — Encounter: Payer: Self-pay | Admitting: Urology

## 2019-09-26 ENCOUNTER — Ambulatory Visit: Payer: Medicare Other | Admitting: Urology

## 2019-09-26 NOTE — Progress Notes (Incomplete)
09/23/19 12:29 PM   John Frank 1955/10/12 [id number]  Referring provider: Idelle Crouch, MD [place] [facility] [place],  [place] [address] No chief complaint on file.   HPI: John Frank is a 64 y.o. white male who presents for the evaluation and management for an elevated PSA of 18.78. - PSA on 4/33/2021 was 18.78      PMH: Past Medical History:  Diagnosis Date  . CAD (coronary artery disease)    s/p stents in 2017  . Chronic systolic CHF (congestive heart failure) ([place])   . COPD (chronic obstructive pulmonary disease) ([place])   . Diabetes ([place])   . GERD (gastroesophageal reflux disease)   . Heart attack ([place])   . Hyperlipidemia   . Hypertension   . Presence of permanent cardiac pacemaker    and defibrillator  . Shortness of breath dyspnea   . Sleep apnea     Surgical History: Past Surgical History:  Procedure Laterality Date  . cardiac stents    . CARDIAC SURGERY     defibrillator and pacemaker  . CORONARY ANGIOPLASTY    . ESOPHAGOGASTRODUODENOSCOPY N/A 09/19/2014   Procedure: ESOPHAGOGASTRODUODENOSCOPY (EGD);  Surgeon: John Lame, MD;  Location: [facility] ENDOSCOPY;  Service: Endoscopy;  Laterality: N/A;  . ESOPHAGOGASTRODUODENOSCOPY (EGD) WITH PROPOFOL N/A 09/28/2017   Procedure: ESOPHAGOGASTRODUODENOSCOPY (EGD) WITH PROPOFOL;  Surgeon: John Manifold, MD;  Location: [facility] ENDOSCOPY;  Service: Endoscopy;  Laterality: N/A;  . ESOPHAGOGASTRODUODENOSCOPY (EGD) WITH PROPOFOL N/A 04/19/2018   Procedure: ESOPHAGOGASTRODUODENOSCOPY (EGD) WITH PROPOFOL with Gastric Mapping;  Surgeon: John Bellows, MD;  Location: [facility] ENDOSCOPY;  Service: Gastroenterology;  Laterality: N/A;  . ESOPHAGOGASTRODUODENOSCOPY (EGD) WITH PROPOFOL N/A 12/13/2018   Procedure: ESOPHAGOGASTRODUODENOSCOPY (EGD) WITH PROPOFOL;  Surgeon: John Bellows, MD;  Location: [facility] ENDOSCOPY;  Service: Gastroenterology;  Laterality: N/A;  . ESOPHAGOGASTRODUODENOSCOPY (EGD) WITH PROPOFOL  N/A 03/31/2019   Procedure: ESOPHAGOGASTRODUODENOSCOPY (EGD) WITH PROPOFOL;  Surgeon: John Landsman, MD;  Location: [facility] ENDOSCOPY;  Service: Gastroenterology;  Laterality: N/A;    Home Medications:  Allergies as of 09/26/2019   No Known Allergies     Medication List       Accurate as of Sep 26, 2019 12:29 PM. If you have any questions, ask your nurse or doctor.        albuterol 108 (90 Base) MCG/ACT inhaler Commonly known as: VENTOLIN HFA Inhale 2 puffs into the lungs every 6 (six) hours as needed for wheezing or shortness of breath.   atorvastatin 40 MG tablet Commonly known as: LIPITOR TAKE 1 TABLET (40 MG TOTAL) BY MOUTH DAILY.   budesonide-formoterol 160-4.5 MCG/ACT inhaler Commonly known as: SYMBICORT Inhale 2 puffs into the lungs 2 (two) times daily.   buPROPion 75 MG tablet Commonly known as: WELLBUTRIN Take 75 mg by mouth daily.   clopidogrel 75 MG tablet Commonly known as: PLAVIX TAKE 1 TABLET (75 MG TOTAL) BY MOUTH DAILY.   Entresto 49-51 MG Generic drug: sacubitril-valsartan TAKE 1 TABLET BY MOUTH TWICE A DAY   furosemide 40 MG tablet Commonly known as: Lasix Take 1 tablet (40 mg total) by mouth daily. What changed: when to take this   glimepiride 4 MG tablet Commonly known as: AMARYL Take 4 mg by mouth daily with breakfast.   ipratropium-albuterol 0.5-2.5 (3) MG/3ML Soln Commonly known as: DUONEB Take 3 mLs by nebulization every 6 (six) hours as needed (wheezing; shortness of breath).   levocetirizine 5 MG tablet Commonly known as: XYZAL Take 5 mg by mouth  daily.   metFORMIN 1000 MG tablet Commonly known as: GLUCOPHAGE TAKE 1 TABLET (1,000 MG TOTAL) BY MOUTH 2 (TWO) TIMES DAILY WITH A MEAL.   metoprolol succinate 100 MG 24 hr tablet Commonly known as: TOPROL-XL Take 1 tablet (100 mg total) by mouth daily.   ondansetron 4 MG disintegrating tablet Commonly known as: Zofran ODT Take 1 tablet (4 mg total) by mouth every 8 (eight) hours  as needed.   ONE TOUCH ULTRA TEST test strip Generic drug: glucose blood USE TO TEST BLOOD SUGAR ONCE DAILY   pantoprazole 40 MG tablet Commonly known as: Protonix Take 1 tablet (40 mg total) by mouth 2 (two) times daily before a meal.   spironolactone 25 MG tablet Commonly known as: ALDACTONE TAKE 1/2 TABLET BY MOUTH EVERY DAY   sucralfate 1 g tablet Commonly known as: CARAFATE Take 1 g by mouth 4 (four) times daily -  with meals and at bedtime.   Trulicity 8.93 YB/0.1BP Sopn Generic drug: Dulaglutide Inject into the skin once a week.       Allergies: No Known Allergies  Family History: Family History  Problem Relation Age of Onset  . Diabetes Mother   . Heart attack Father 65    Social History:  reports that he has been smoking cigarettes. He has a 10.00 pack-year smoking history. He has quit using smokeless tobacco. He reports that he does not drink alcohol or use drugs.   Physical Exam: There were no vitals taken for this visit.  Constitutional:  Alert and oriented, No acute distress. HEENT: [place] AT, moist mucus membranes.  Trachea midline, no masses. Cardiovascular: No clubbing, cyanosis, or edema. Respiratory: Normal respiratory effort, no increased work of breathing. GI: Abdomen is soft, nontender, nondistended, no abdominal masses GU: No CVA tenderness Lymph: No cervical or inguinal lymphadenopathy. Skin: No rashes, bruises or suspicious lesions. Neurologic: Grossly intact, no focal deficits, moving all 4 extremities. Psychiatric: Normal mood and affect.  Laboratory Data:  Lab Results  Component Value Date   CREATININE 1.04 09/06/2019    No results found for: PSA  No results found for: TESTOSTERONE  Lab Results  Component Value Date   HGBA1C 7.2 (H) 05/25/2018    Urinalysis   Pertinent Imaging: *** No results found for this or any previous visit. No results found for this or any previous visit. No results found for this or any previous  visit. No results found for this or any previous visit. No results found for this or any previous visit. No results found for this or any previous visit. No results found for this or any previous visit. No results found for this or any previous visit.  Assessment & Plan:     No follow-ups on file.  [place] [address] [place], [place] [address] [phone]

## 2019-10-17 ENCOUNTER — Other Ambulatory Visit (INDEPENDENT_AMBULATORY_CARE_PROVIDER_SITE_OTHER): Payer: Self-pay | Admitting: Vascular Surgery

## 2019-10-17 DIAGNOSIS — I739 Peripheral vascular disease, unspecified: Secondary | ICD-10-CM

## 2019-10-18 ENCOUNTER — Ambulatory Visit (INDEPENDENT_AMBULATORY_CARE_PROVIDER_SITE_OTHER): Payer: Medicare Other | Admitting: Vascular Surgery

## 2019-10-18 ENCOUNTER — Encounter (INDEPENDENT_AMBULATORY_CARE_PROVIDER_SITE_OTHER): Payer: Medicare Other

## 2019-10-20 ENCOUNTER — Ambulatory Visit: Payer: Self-pay | Admitting: Urology

## 2019-10-20 ENCOUNTER — Encounter: Payer: Self-pay | Admitting: Urology

## 2019-11-09 ENCOUNTER — Ambulatory Visit (INDEPENDENT_AMBULATORY_CARE_PROVIDER_SITE_OTHER): Payer: Medicare Other | Admitting: Urology

## 2019-11-09 ENCOUNTER — Other Ambulatory Visit: Payer: Self-pay

## 2019-11-09 ENCOUNTER — Encounter: Payer: Self-pay | Admitting: Urology

## 2019-11-09 VITALS — BP 119/68 | HR 82 | Ht 64.0 in | Wt 135.0 lb

## 2019-11-09 DIAGNOSIS — N400 Enlarged prostate without lower urinary tract symptoms: Secondary | ICD-10-CM

## 2019-11-09 DIAGNOSIS — R972 Elevated prostate specific antigen [PSA]: Secondary | ICD-10-CM | POA: Diagnosis not present

## 2019-11-09 NOTE — Progress Notes (Signed)
10/11/19 10:22 AM   John Frank Nov 17, 1955 [id number]  Referring provider: Idelle Crouch, MD [facility] [place],  [place] [address] Chief Complaint  Patient presents with   Other    HPI: John Frank is a 64 y.o. male who presents today for elevated PSA  -PSA drawn 05/2019 was 18.78. Only prior PSA on record review was 5.88 in 02/2019 -No bothersome LUTS -Denies UTI, gross hematuria -No family history of PCa  PMH: Past Medical History:  Diagnosis Date   CAD (coronary artery disease)    s/p stents in 3154   Chronic systolic CHF (congestive heart failure) ([facility])    COPD (chronic obstructive pulmonary disease) ([facility])    Diabetes ([facility])    GERD (gastroesophageal reflux disease)    Heart attack ([place])    Hyperlipidemia    Hypertension    Presence of permanent cardiac pacemaker    and defibrillator   Shortness of breath dyspnea    Sleep apnea     Surgical History: Past Surgical History:  Procedure Laterality Date   cardiac stents     CARDIAC SURGERY     defibrillator and pacemaker   CORONARY ANGIOPLASTY     ESOPHAGOGASTRODUODENOSCOPY N/A 09/19/2014   Procedure: ESOPHAGOGASTRODUODENOSCOPY (EGD);  Surgeon: Lucilla Lame, MD;  Location: [facility] ENDOSCOPY;  Service: Endoscopy;  Laterality: N/A;   ESOPHAGOGASTRODUODENOSCOPY (EGD) WITH PROPOFOL N/A 09/28/2017   Procedure: ESOPHAGOGASTRODUODENOSCOPY (EGD) WITH PROPOFOL;  Surgeon: Virgel Manifold, MD;  Location: [facility] ENDOSCOPY;  Service: Endoscopy;  Laterality: N/A;   ESOPHAGOGASTRODUODENOSCOPY (EGD) WITH PROPOFOL N/A 04/19/2018   Procedure: ESOPHAGOGASTRODUODENOSCOPY (EGD) WITH PROPOFOL with Gastric Mapping;  Surgeon: [name], MD;  Location: [facility] ENDOSCOPY;  Service: Gastroenterology;  Laterality: N/A;   ESOPHAGOGASTRODUODENOSCOPY (EGD) WITH PROPOFOL N/A 12/13/2018   Procedure: ESOPHAGOGASTRODUODENOSCOPY (EGD) WITH PROPOFOL;  Surgeon: [name], MD;  Location: [facility] ENDOSCOPY;   Service: Gastroenterology;  Laterality: N/A;   ESOPHAGOGASTRODUODENOSCOPY (EGD) WITH PROPOFOL N/A 03/31/2019   Procedure: ESOPHAGOGASTRODUODENOSCOPY (EGD) WITH PROPOFOL;  Surgeon: Lin Landsman, MD;  Location: [facility] ENDOSCOPY;  Service: Gastroenterology;  Laterality: N/A;    Home Medications:  Allergies as of 11/09/2019   No Known Allergies     Medication List       Accurate as of November 09, 2019 10:22 AM. If you have any questions, ask your nurse or doctor.        albuterol 108 (90 Base) MCG/ACT inhaler Commonly known as: VENTOLIN HFA Inhale 2 puffs into the lungs every 6 (six) hours as needed for wheezing or shortness of breath.   atorvastatin 40 MG tablet Commonly known as: LIPITOR TAKE 1 TABLET (40 MG TOTAL) BY MOUTH DAILY.   budesonide-formoterol 160-4.5 MCG/ACT inhaler Commonly known as: SYMBICORT Inhale 2 puffs into the lungs 2 (two) times daily.   buPROPion 75 MG tablet Commonly known as: WELLBUTRIN Take 75 mg by mouth daily.   clopidogrel 75 MG tablet Commonly known as: PLAVIX TAKE 1 TABLET (75 MG TOTAL) BY MOUTH DAILY.   Entresto 49-51 MG Generic drug: sacubitril-valsartan TAKE 1 TABLET BY MOUTH TWICE A DAY   furosemide 40 MG tablet Commonly known as: Lasix Take 1 tablet (40 mg total) by mouth daily. What changed: when to take this   glimepiride 4 MG tablet Commonly known as: AMARYL Take 4 mg by mouth daily with breakfast.   ipratropium-albuterol 0.5-2.5 (3) MG/3ML Soln Commonly known as: DUONEB Take 3 mLs by nebulization every 6 (six) hours as needed (wheezing; shortness of breath).  levocetirizine 5 MG tablet Commonly known as: XYZAL Take 5 mg by mouth daily.   metFORMIN 1000 MG tablet Commonly known as: GLUCOPHAGE TAKE 1 TABLET (1,000 MG TOTAL) BY MOUTH 2 (TWO) TIMES DAILY WITH A MEAL.   metoprolol succinate 100 MG 24 hr tablet Commonly known as: TOPROL-XL Take 1 tablet (100 mg total) by mouth daily.   ondansetron 4 MG disintegrating  tablet Commonly known as: Zofran ODT Take 1 tablet (4 mg total) by mouth every 8 (eight) hours as needed.   ONE TOUCH ULTRA TEST test strip Generic drug: glucose blood USE TO TEST BLOOD SUGAR ONCE DAILY   pantoprazole 40 MG tablet Commonly known as: Protonix Take 1 tablet (40 mg total) by mouth 2 (two) times daily before a meal.   spironolactone 25 MG tablet Commonly known as: ALDACTONE TAKE 1/2 TABLET BY MOUTH EVERY DAY   sucralfate 1 g tablet Commonly known as: CARAFATE Take 1 g by mouth 4 (four) times daily -  with meals and at bedtime.   Trulicity 8.09 XI/3.3AS Sopn Generic drug: Dulaglutide Inject into the skin once a week.       Allergies: No Known Allergies  Family History: Family History  Problem Relation Age of Onset   Diabetes Mother    Heart attack Father 15    Social History:  reports that he has been smoking cigarettes. He has a 10.00 pack-year smoking history. He has quit using smokeless tobacco. He reports that he does not drink alcohol and does not use drugs.   Physical Exam: BP 119/68    Pulse 82    Ht 5\' 4"  (1.626 m)    Wt 135 lb (61.2 kg)    BMI 23.17 kg/m   Constitutional:  Alert and oriented, No acute distress. HEENT: [place] AT, moist mucus membranes.  Trachea midline, no masses. Cardiovascular: No clubbing, cyanosis, or edema. Respiratory: Normal respiratory effort, no increased work of breathing. GI: Abdomen is soft, nontender, nondistended, no abdominal masses GU: No CVA tenderness. Prostate is 50 grams, smooth, no nodules or induration noted. Phallus is without lesions, testes descended bilaterally without masses or tenderess Lymph: No cervical or inguinal lymphadenopathy. Skin: No rashes, bruises or suspicious lesions. Neurologic: Grossly intact, no focal deficits, moving all 4 extremities. Psychiatric: Normal mood and affect.   Assessment & Plan:   1. Elevated PSA -January PSA was significantly elevated  -Will repeat today and if it is  elevated, will recommend TURS  -Procedure was discussed in detail including potential risks of bleeding and infection sepsis  [facility] [address] [place], [place] [address] [phone]  I, Joneen Boers Peace, am acting as a Education administrator for Dr. Nicki Reaper C. Tanette Chauca.  I have reviewed the above documentation for accuracy and completeness, and I agree with the above.   John Sons, MD

## 2019-11-10 LAB — PSA: Prostate Specific Ag, Serum: 4.6 ng/mL — ABNORMAL HIGH (ref 0.0–4.0)

## 2019-11-11 ENCOUNTER — Encounter: Payer: Self-pay | Admitting: Urology

## 2019-11-11 ENCOUNTER — Telehealth: Payer: Self-pay | Admitting: Family Medicine

## 2019-11-11 NOTE — Telephone Encounter (Signed)
Patient notified and voiced understanding. Appointment has been scheduled.

## 2019-11-11 NOTE — Telephone Encounter (Signed)
-----   Message from [name], MD sent at 11/11/2019  9:52 AM EDT ----- Repeat PSA remains elevated but was lower.  Recommend scheduling prostate biopsy

## 2019-12-02 ENCOUNTER — Other Ambulatory Visit: Payer: Self-pay | Admitting: Urology

## 2019-12-02 ENCOUNTER — Telehealth: Payer: Self-pay | Admitting: *Deleted

## 2019-12-02 NOTE — Telephone Encounter (Signed)
Patient needs clearance to stop his medication. Sent clearance

## 2019-12-08 ENCOUNTER — Other Ambulatory Visit: Payer: Self-pay | Admitting: Urology

## 2019-12-15 NOTE — Telephone Encounter (Signed)
Patient stop his plavix on 12/12/2019. He stop it on his on. He is schedule for prostate biopsy on 12/19/2019 need to go over instruction with pt.

## 2019-12-15 NOTE — Telephone Encounter (Signed)
Incoming call on triage line from Heart Failure in regards to cardiac clearance. Per [employer] (who works with Otila Kluver), they do not do cardiac clearances. May have been forwarded to [facility], however Amber was not sure. She suggest sending another cardiac clearance for patient.

## 2019-12-19 ENCOUNTER — Other Ambulatory Visit: Payer: Self-pay | Admitting: Urology

## 2020-01-03 DIAGNOSIS — J449 Chronic obstructive pulmonary disease, unspecified: Secondary | ICD-10-CM | POA: Diagnosis not present

## 2020-01-03 DIAGNOSIS — I5023 Acute on chronic systolic (congestive) heart failure: Secondary | ICD-10-CM | POA: Diagnosis not present

## 2020-01-03 DIAGNOSIS — I472 Ventricular tachycardia: Secondary | ICD-10-CM | POA: Diagnosis not present

## 2020-01-03 DIAGNOSIS — I10 Essential (primary) hypertension: Secondary | ICD-10-CM | POA: Diagnosis not present

## 2020-01-03 DIAGNOSIS — Z4502 Encounter for adjustment and management of automatic implantable cardiac defibrillator: Secondary | ICD-10-CM | POA: Diagnosis not present

## 2020-01-03 DIAGNOSIS — E782 Mixed hyperlipidemia: Secondary | ICD-10-CM | POA: Diagnosis not present

## 2020-01-03 DIAGNOSIS — I2510 Atherosclerotic heart disease of native coronary artery without angina pectoris: Secondary | ICD-10-CM | POA: Diagnosis not present

## 2020-01-03 DIAGNOSIS — Z955 Presence of coronary angioplasty implant and graft: Secondary | ICD-10-CM | POA: Diagnosis not present

## 2020-01-03 DIAGNOSIS — E1165 Type 2 diabetes mellitus with hyperglycemia: Secondary | ICD-10-CM | POA: Diagnosis not present

## 2020-01-04 ENCOUNTER — Other Ambulatory Visit: Payer: Self-pay

## 2020-01-04 ENCOUNTER — Emergency Department
Admission: EM | Admit: 2020-01-04 | Discharge: 2020-01-04 | Disposition: A | Discharge status: Home / Self Care | Payer: Medicare HMO | Attending: Emergency Medicine | Admitting: Emergency Medicine

## 2020-01-04 DIAGNOSIS — R0789 Other chest pain: Secondary | ICD-10-CM | POA: Diagnosis not present

## 2020-01-04 DIAGNOSIS — R531 Weakness: Secondary | ICD-10-CM | POA: Diagnosis not present

## 2020-01-04 DIAGNOSIS — Z5321 Procedure and treatment not carried out due to patient leaving prior to being seen by health care provider: Secondary | ICD-10-CM | POA: Insufficient documentation

## 2020-01-04 DIAGNOSIS — R079 Chest pain, unspecified: Secondary | ICD-10-CM | POA: Diagnosis not present

## 2020-01-04 DIAGNOSIS — I499 Cardiac arrhythmia, unspecified: Secondary | ICD-10-CM | POA: Diagnosis not present

## 2020-01-04 DIAGNOSIS — Z743 Need for continuous supervision: Secondary | ICD-10-CM | POA: Diagnosis not present

## 2020-01-04 DIAGNOSIS — R404 Transient alteration of awareness: Secondary | ICD-10-CM | POA: Diagnosis not present

## 2020-01-04 LAB — BASIC METABOLIC PANEL
Anion gap: 10 (ref 5–15)
BUN: 11 mg/dL (ref 8–23)
CO2: 27 mmol/L (ref 22–32)
Calcium: 9.3 mg/dL (ref 8.9–10.3)
Chloride: 95 mmol/L — ABNORMAL LOW (ref 98–111)
Creatinine, Ser: 0.79 mg/dL (ref 0.61–1.24)
GFR calc Af Amer: >60 mL/min (ref >60)
GFR calc non Af Amer: >60 mL/min (ref >60)
Glucose, Bld: 496 mg/dL — ABNORMAL HIGH (ref 70–99)
Potassium: 4.8 mmol/L (ref 3.5–5.1)
Sodium: 132 mmol/L — ABNORMAL LOW (ref 135–145)

## 2020-01-04 LAB — CBC
HCT: 43.3 % (ref 39.0–52.0)
Hemoglobin: 14.7 g/dL (ref 13.0–17.0)
MCH: 30.8 pg (ref 26.0–34.0)
MCHC: 33.9 g/dL (ref 30.0–36.0)
MCV: 90.8 fL (ref 80.0–100.0)
Platelets: 200 10*3/uL (ref 150–400)
RBC: 4.77 MIL/uL (ref 4.22–5.81)
RDW: 14.6 % (ref 11.5–15.5)
WBC: 6.7 10*3/uL (ref 4.0–10.5)
nRBC: 0 % (ref 0.0–0.2)

## 2020-01-04 NOTE — ED Triage Notes (Signed)
Pt in via EMS from home with c/o dizziness due to his pacemaker firing.  Pt went to the MD yesterday because his pacemaker kept going off. Pt reports he went to his MD and they were going to schedule him in to have his battery changed but every time it happens he gets dizzy.  133/67, HR 90

## 2020-01-04 NOTE — ED Triage Notes (Signed)
See first nurse note:  Pt here with weakness due to his pacemaker firing. Pt states that he needs another battery. Pt NAD in triage.

## 2020-01-06 ENCOUNTER — Other Ambulatory Visit: Payer: Self-pay | Admitting: Urology

## 2020-01-06 ENCOUNTER — Encounter: Payer: Self-pay | Admitting: Urology

## 2020-01-09 ENCOUNTER — Telehealth: Payer: Self-pay | Admitting: Emergency Medicine

## 2020-01-09 NOTE — Telephone Encounter (Signed)
Called patient due to lwot to inquire about condition and follow up plans. Says he felt dizzy again this am.  I told him he needs to let cardiologist know that he has been having the dizziness.  He agrees.

## 2020-01-13 ENCOUNTER — Other Ambulatory Visit: Payer: Self-pay | Admitting: Internal Medicine

## 2020-01-13 DIAGNOSIS — R634 Abnormal weight loss: Secondary | ICD-10-CM

## 2020-01-15 ENCOUNTER — Emergency Department
Admission: EM | Admit: 2020-01-15 | Discharge: 2020-01-15 | Disposition: A | Discharge status: Home / Self Care | Payer: Medicare Other | Attending: Emergency Medicine | Admitting: Emergency Medicine

## 2020-01-15 ENCOUNTER — Other Ambulatory Visit: Payer: Self-pay

## 2020-01-15 ENCOUNTER — Emergency Department: Payer: Medicare Other

## 2020-01-15 DIAGNOSIS — Z7902 Long term (current) use of antithrombotics/antiplatelets: Secondary | ICD-10-CM | POA: Diagnosis not present

## 2020-01-15 DIAGNOSIS — Z955 Presence of coronary angioplasty implant and graft: Secondary | ICD-10-CM | POA: Diagnosis not present

## 2020-01-15 DIAGNOSIS — I110 Hypertensive heart disease with heart failure: Secondary | ICD-10-CM | POA: Insufficient documentation

## 2020-01-15 DIAGNOSIS — Z950 Presence of cardiac pacemaker: Secondary | ICD-10-CM | POA: Diagnosis not present

## 2020-01-15 DIAGNOSIS — I252 Old myocardial infarction: Secondary | ICD-10-CM | POA: Diagnosis not present

## 2020-01-15 DIAGNOSIS — J449 Chronic obstructive pulmonary disease, unspecified: Secondary | ICD-10-CM | POA: Insufficient documentation

## 2020-01-15 DIAGNOSIS — F17210 Nicotine dependence, cigarettes, uncomplicated: Secondary | ICD-10-CM | POA: Diagnosis not present

## 2020-01-15 DIAGNOSIS — I2510 Atherosclerotic heart disease of native coronary artery without angina pectoris: Secondary | ICD-10-CM | POA: Diagnosis not present

## 2020-01-15 DIAGNOSIS — I5022 Chronic systolic (congestive) heart failure: Secondary | ICD-10-CM | POA: Insufficient documentation

## 2020-01-15 DIAGNOSIS — Z20822 Contact with and (suspected) exposure to covid-19: Secondary | ICD-10-CM | POA: Diagnosis not present

## 2020-01-15 DIAGNOSIS — E119 Type 2 diabetes mellitus without complications: Secondary | ICD-10-CM | POA: Insufficient documentation

## 2020-01-15 DIAGNOSIS — R079 Chest pain, unspecified: Secondary | ICD-10-CM | POA: Diagnosis not present

## 2020-01-15 DIAGNOSIS — Z79899 Other long term (current) drug therapy: Secondary | ICD-10-CM | POA: Insufficient documentation

## 2020-01-15 DIAGNOSIS — Z7984 Long term (current) use of oral hypoglycemic drugs: Secondary | ICD-10-CM | POA: Diagnosis not present

## 2020-01-15 LAB — CBC
HCT: 45.5 % (ref 39.0–52.0)
Hemoglobin: 15.9 g/dL (ref 13.0–17.0)
MCH: 31 pg (ref 26.0–34.0)
MCHC: 34.9 g/dL (ref 30.0–36.0)
MCV: 88.7 fL (ref 80.0–100.0)
Platelets: 217 10*3/uL (ref 150–400)
RBC: 5.13 MIL/uL (ref 4.22–5.81)
RDW: 14.8 % (ref 11.5–15.5)
WBC: 9.4 10*3/uL (ref 4.0–10.5)
nRBC: 0 % (ref 0.0–0.2)

## 2020-01-15 LAB — BASIC METABOLIC PANEL
Anion gap: 9 (ref 5–15)
BUN: 9 mg/dL (ref 8–23)
CO2: 26 mmol/L (ref 22–32)
Calcium: 9.1 mg/dL (ref 8.9–10.3)
Chloride: 100 mmol/L (ref 98–111)
Creatinine, Ser: 0.62 mg/dL (ref 0.61–1.24)
GFR calc Af Amer: >60 mL/min (ref >60)
GFR calc non Af Amer: >60 mL/min (ref >60)
Glucose, Bld: 397 mg/dL — ABNORMAL HIGH (ref 70–99)
Potassium: 3.8 mmol/L (ref 3.5–5.1)
Sodium: 135 mmol/L (ref 135–145)

## 2020-01-15 LAB — SARS CORONAVIRUS 2 BY RT PCR (HOSPITAL ORDER, PERFORMED IN ~~LOC~~ HOSPITAL LAB): SARS Coronavirus 2: NEGATIVE

## 2020-01-15 LAB — TROPONIN I (HIGH SENSITIVITY)
Troponin I (High Sensitivity): 28 ng/L — ABNORMAL HIGH (ref <18)
Troponin I (High Sensitivity): 71 ng/L — ABNORMAL HIGH (ref <18)

## 2020-01-15 MED ORDER — NICOTINE 7 MG/24HR TD PT24
7.0000 mg | MEDICATED_PATCH | Freq: Once | TRANSDERMAL | Status: DC
  Administered 2020-01-15: 7 mg via TRANSDERMAL
  Filled 2020-01-15: qty 1

## 2020-01-15 MED ORDER — ALBUTEROL SULFATE (2.5 MG/3ML) 0.083% IN NEBU
2.5000 mg | INHALATION_SOLUTION | Freq: Once | RESPIRATORY_TRACT | Status: AC
  Administered 2020-01-15: 2.5 mg via RESPIRATORY_TRACT
  Filled 2020-01-15: qty 3
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
## 2020-01-15 NOTE — ED Notes (Signed)
Patient was outside smoking when called for room.  AAOx3. Skin warm and dry. NAD.  No SOB/ DOE.

## 2020-01-25 ENCOUNTER — Other Ambulatory Visit: Payer: Self-pay

## 2020-01-25 ENCOUNTER — Ambulatory Visit
Admission: RE | Admit: 2020-01-25 | Discharge: 2020-01-25 | Disposition: A | Discharge status: Home / Self Care | Payer: Medicare Other | Source: Ambulatory Visit | Attending: Internal Medicine | Admitting: Internal Medicine

## 2020-01-25 DIAGNOSIS — R634 Abnormal weight loss: Secondary | ICD-10-CM | POA: Insufficient documentation

## 2020-01-25 MED ORDER — IOHEXOL 300 MG/ML  SOLN
100.0000 mL | Freq: Once | INTRAMUSCULAR | Status: AC | PRN
  Administered 2020-01-25: 100 mL via INTRAVENOUS

## 2020-01-26 ENCOUNTER — Other Ambulatory Visit
Admission: RE | Admit: 2020-01-26 | Discharge: 2020-01-26 | Disposition: A | Discharge status: Home / Self Care | Payer: Medicare Other | Source: Ambulatory Visit | Attending: Internal Medicine | Admitting: Internal Medicine

## 2020-01-26 ENCOUNTER — Ambulatory Visit: Payer: Self-pay | Admitting: Urology

## 2020-01-26 DIAGNOSIS — I5023 Acute on chronic systolic (congestive) heart failure: Secondary | ICD-10-CM | POA: Diagnosis not present

## 2020-01-26 DIAGNOSIS — G9001 Carotid sinus syncope: Secondary | ICD-10-CM | POA: Diagnosis not present

## 2020-01-26 DIAGNOSIS — I472 Ventricular tachycardia: Secondary | ICD-10-CM | POA: Diagnosis not present

## 2020-01-26 DIAGNOSIS — Z01818 Encounter for other preprocedural examination: Secondary | ICD-10-CM | POA: Diagnosis present

## 2020-01-26 LAB — BRAIN NATRIURETIC PEPTIDE: B Natriuretic Peptide: 103.1 pg/mL — ABNORMAL HIGH (ref 0.0–100.0)

## 2020-01-27 ENCOUNTER — Telehealth: Payer: Self-pay | Admitting: Urology

## 2020-01-27 NOTE — Telephone Encounter (Signed)
I received a new referral for pt looks like he may just need to be rescheduled for his No showed Biopsy apt?

## 2020-01-27 NOTE — Telephone Encounter (Signed)
Patient states he is having a lot going on right now. He will call us back to reschedule.

## 2020-02-03 ENCOUNTER — Other Ambulatory Visit: Admission: RE | Admit: 2020-02-03 | Payer: Medicare Other | Source: Ambulatory Visit

## 2020-02-07 ENCOUNTER — Ambulatory Visit: Admission: RE | Admit: 2020-02-07 | Payer: Medicare Other | Source: Home / Self Care | Admitting: Cardiology

## 2020-02-07 ENCOUNTER — Encounter: Admission: RE | Payer: Self-pay | Source: Home / Self Care

## 2020-02-07 SURGERY — ICD GENERATOR CHANGE
Anesthesia: Choice | Laterality: Left

## 2020-02-17 ENCOUNTER — Other Ambulatory Visit: Payer: Self-pay

## 2020-02-17 ENCOUNTER — Other Ambulatory Visit
Admission: RE | Admit: 2020-02-17 | Discharge: 2020-02-17 | Disposition: A | Discharge status: Home / Self Care | Payer: Medicare Other | Source: Ambulatory Visit | Attending: Cardiology | Admitting: Cardiology

## 2020-02-17 DIAGNOSIS — Z01812 Encounter for preprocedural laboratory examination: Secondary | ICD-10-CM | POA: Diagnosis present

## 2020-02-17 DIAGNOSIS — Z20822 Contact with and (suspected) exposure to covid-19: Secondary | ICD-10-CM | POA: Diagnosis not present

## 2020-02-18 LAB — SARS CORONAVIRUS 2 (TAT 6-24 HRS): SARS Coronavirus 2: NEGATIVE

## 2020-02-21 ENCOUNTER — Encounter: Payer: Self-pay | Admitting: Cardiology

## 2020-02-21 ENCOUNTER — Other Ambulatory Visit: Payer: Self-pay

## 2020-02-21 ENCOUNTER — Ambulatory Visit: Payer: Medicare Other | Admitting: Registered Nurse

## 2020-02-21 ENCOUNTER — Encounter
Admission: RE | Disposition: A | Discharge status: Home / Self Care | Payer: Self-pay | Source: Home / Self Care | Attending: Cardiology

## 2020-02-21 ENCOUNTER — Ambulatory Visit
Admission: RE | Admit: 2020-02-21 | Discharge: 2020-02-21 | Disposition: A | Discharge status: Home / Self Care | Payer: Medicare Other | Attending: Cardiology | Admitting: Cardiology

## 2020-02-21 DIAGNOSIS — Z794 Long term (current) use of insulin: Secondary | ICD-10-CM | POA: Insufficient documentation

## 2020-02-21 DIAGNOSIS — I252 Old myocardial infarction: Secondary | ICD-10-CM | POA: Insufficient documentation

## 2020-02-21 DIAGNOSIS — Z79899 Other long term (current) drug therapy: Secondary | ICD-10-CM | POA: Diagnosis not present

## 2020-02-21 DIAGNOSIS — Z45018 Encounter for adjustment and management of other part of cardiac pacemaker: Secondary | ICD-10-CM | POA: Insufficient documentation

## 2020-02-21 DIAGNOSIS — G4730 Sleep apnea, unspecified: Secondary | ICD-10-CM | POA: Insufficient documentation

## 2020-02-21 DIAGNOSIS — I10 Essential (primary) hypertension: Secondary | ICD-10-CM | POA: Insufficient documentation

## 2020-02-21 DIAGNOSIS — Z7902 Long term (current) use of antithrombotics/antiplatelets: Secondary | ICD-10-CM | POA: Insufficient documentation

## 2020-02-21 DIAGNOSIS — J449 Chronic obstructive pulmonary disease, unspecified: Secondary | ICD-10-CM | POA: Insufficient documentation

## 2020-02-21 DIAGNOSIS — Z7984 Long term (current) use of oral hypoglycemic drugs: Secondary | ICD-10-CM | POA: Insufficient documentation

## 2020-02-21 DIAGNOSIS — E119 Type 2 diabetes mellitus without complications: Secondary | ICD-10-CM | POA: Insufficient documentation

## 2020-02-21 DIAGNOSIS — Z4502 Encounter for adjustment and management of automatic implantable cardiac defibrillator: Secondary | ICD-10-CM | POA: Diagnosis not present

## 2020-02-21 HISTORY — PX: IMPLANTABLE CARDIOVERTER DEFIBRILLATOR (ICD) GENERATOR CHANGE: SHX5469

## 2020-02-21 LAB — GLUCOSE, CAPILLARY
Glucose-Capillary: 450 mg/dL — ABNORMAL HIGH (ref 70–99)
Glucose-Capillary: 433 mg/dL — ABNORMAL HIGH (ref 70–99)
Glucose-Capillary: 325 mg/dL — ABNORMAL HIGH (ref 70–99)

## 2020-02-21 LAB — CBC WITH DIFFERENTIAL/PLATELET
Abs Immature Granulocytes: 0.05 10*3/uL (ref 0.00–0.07)
Basophils Absolute: 0.1 10*3/uL (ref 0.0–0.1)
Basophils Relative: 1 %
Eosinophils Absolute: 0.1 10*3/uL (ref 0.0–0.5)
Eosinophils Relative: 1 %
HCT: 44.1 % (ref 39.0–52.0)
Hemoglobin: 15.3 g/dL (ref 13.0–17.0)
Immature Granulocytes: 1 %
Lymphocytes Relative: 15 %
Lymphs Abs: 1.3 10*3/uL (ref 0.7–4.0)
MCH: 31.2 pg (ref 26.0–34.0)
MCHC: 34.7 g/dL (ref 30.0–36.0)
MCV: 90 fL (ref 80.0–100.0)
Monocytes Absolute: 0.4 10*3/uL (ref 0.1–1.0)
Monocytes Relative: 5 %
Neutro Abs: 6.8 10*3/uL (ref 1.7–7.7)
Neutrophils Relative %: 77 %
Platelets: 254 10*3/uL (ref 150–400)
RBC: 4.9 MIL/uL (ref 4.22–5.81)
RDW: 14.3 % (ref 11.5–15.5)
WBC: 8.7 10*3/uL (ref 4.0–10.5)
nRBC: 0 % (ref 0.0–0.2)

## 2020-02-21 LAB — BASIC METABOLIC PANEL
Anion gap: 12 (ref 5–15)
BUN: 14 mg/dL (ref 8–23)
CO2: 25 mmol/L (ref 22–32)
Calcium: 9.9 mg/dL (ref 8.9–10.3)
Chloride: 93 mmol/L — ABNORMAL LOW (ref 98–111)
Creatinine, Ser: 0.85 mg/dL (ref 0.61–1.24)
GFR, Estimated: >60 mL/min (ref >60)
Glucose, Bld: 476 mg/dL — ABNORMAL HIGH (ref 70–99)
Potassium: 4.7 mmol/L (ref 3.5–5.1)
Sodium: 130 mmol/L — ABNORMAL LOW (ref 135–145)

## 2020-02-21 SURGERY — ICD GENERATOR CHANGE
Anesthesia: General | Laterality: Left

## 2020-02-21 MED ORDER — SODIUM CHLORIDE 0.9 % IV SOLN: INTRAVENOUS | Status: DC

## 2020-02-21 MED ORDER — SODIUM CHLORIDE 0.9 % IV SOLN
80.0000 mg | INTRAVENOUS | Status: DC
  Filled 2020-02-21: qty 2

## 2020-02-21 MED ORDER — GENTAMICIN SULFATE 40 MG/ML IJ SOLN
INTRAMUSCULAR | Status: AC
  Filled 2020-02-21: qty 2

## 2020-02-21 MED ORDER — CHLORHEXIDINE GLUCONATE 0.12 % MT SOLN: 15.0000 mL | Freq: Once | OROMUCOSAL | Status: AC

## 2020-02-21 MED ORDER — LIDOCAINE HCL (CARDIAC) PF 100 MG/5ML IV SOSY
PREFILLED_SYRINGE | INTRAVENOUS | Status: DC | PRN
  Administered 2020-02-21: 60 mg via INTRAVENOUS

## 2020-02-21 MED ORDER — PROPOFOL 500 MG/50ML IV EMUL
INTRAVENOUS | Status: DC | PRN
  Administered 2020-02-21: 100 ug/kg/min via INTRAVENOUS

## 2020-02-21 MED ORDER — ACETAMINOPHEN 10 MG/ML IV SOLN
INTRAVENOUS | Status: DC | PRN
  Administered 2020-02-21: 1000 mg via INTRAVENOUS

## 2020-02-21 MED ORDER — LIDOCAINE 1 % OPTIME INJ - NO CHARGE
INTRAMUSCULAR | Status: DC | PRN
  Administered 2020-02-21: 25 mL

## 2020-02-21 MED ORDER — PHENYLEPHRINE HCL (PRESSORS) 10 MG/ML IV SOLN
INTRAVENOUS | Status: DC | PRN
  Administered 2020-02-21: 100 ug via INTRAVENOUS
  Administered 2020-02-21: 200 ug via INTRAVENOUS
  Administered 2020-02-21 (×4): 100 ug via INTRAVENOUS

## 2020-02-21 MED ORDER — CEPHALEXIN 250 MG PO CAPS: 500.0000 mg | ORAL_CAPSULE | Freq: Two times a day (BID) | ORAL | 0 refills | Status: DC

## 2020-02-21 MED ORDER — SODIUM CHLORIDE (PF) 0.9 % IJ SOLN
INTRAMUSCULAR | Status: AC
  Filled 2020-02-21: qty 10

## 2020-02-21 MED ORDER — LIVING WELL WITH DIABETES BOOK
Freq: Once | Status: DC
  Filled 2020-02-21 (×2): qty 1

## 2020-02-21 MED ORDER — ORAL CARE MOUTH RINSE: 15.0000 mL | Freq: Once | OROMUCOSAL | Status: AC

## 2020-02-21 MED ORDER — INSULIN ASPART 100 UNIT/ML ~~LOC~~ SOLN
10.0000 [IU] | Freq: Once | SUBCUTANEOUS | Status: AC
  Administered 2020-02-21: 10 [IU] via SUBCUTANEOUS

## 2020-02-21 MED ORDER — FENTANYL CITRATE (PF) 100 MCG/2ML IJ SOLN
INTRAMUSCULAR | Status: DC | PRN
Start: 2020-02-21 — End: 2020-02-21
  Administered 2020-02-21 (×2): 25 ug via INTRAVENOUS

## 2020-02-21 MED ORDER — CEFAZOLIN SODIUM-DEXTROSE 2-4 GM/100ML-% IV SOLN
2.0000 g | INTRAVENOUS | Status: AC
  Administered 2020-02-21: 2 g via INTRAVENOUS

## 2020-02-21 MED ORDER — DEXMEDETOMIDINE (PRECEDEX) IN NS 20 MCG/5ML (4 MCG/ML) IV SYRINGE
PREFILLED_SYRINGE | INTRAVENOUS | Status: AC
  Filled 2020-02-21: qty 5

## 2020-02-21 MED ORDER — PROPOFOL 500 MG/50ML IV EMUL
INTRAVENOUS | Status: AC
  Filled 2020-02-21: qty 50

## 2020-02-21 MED ORDER — DEXMEDETOMIDINE HCL 200 MCG/2ML IV SOLN
INTRAVENOUS | Status: DC | PRN
  Administered 2020-02-21: 12 ug via INTRAVENOUS

## 2020-02-21 MED ORDER — KETAMINE HCL 10 MG/ML IJ SOLN
INTRAMUSCULAR | Status: DC | PRN
  Administered 2020-02-21 (×2): 10 mg via INTRAVENOUS

## 2020-02-21 MED ORDER — PROPOFOL 10 MG/ML IV BOLUS
INTRAVENOUS | Status: DC | PRN
  Administered 2020-02-21: 30 mg via INTRAVENOUS
  Administered 2020-02-21: 40 mg via INTRAVENOUS

## 2020-02-21 MED ORDER — ACETAMINOPHEN 10 MG/ML IV SOLN
INTRAVENOUS | Status: AC
  Filled 2020-02-21: qty 100

## 2020-02-21 MED ORDER — EPHEDRINE SULFATE 50 MG/ML IJ SOLN
INTRAMUSCULAR | Status: DC | PRN
  Administered 2020-02-21: 5 mg via INTRAVENOUS

## 2020-02-21 MED ORDER — INSULIN ASPART 100 UNIT/ML ~~LOC~~ SOLN
SUBCUTANEOUS | Status: AC
  Filled 2020-02-21: qty 1

## 2020-02-21 MED ORDER — FENTANYL CITRATE (PF) 100 MCG/2ML IJ SOLN
INTRAMUSCULAR | Status: AC
  Filled 2020-02-21: qty 2

## 2020-02-21 MED ORDER — CEFAZOLIN SODIUM-DEXTROSE 2-4 GM/100ML-% IV SOLN
INTRAVENOUS | Status: AC
  Filled 2020-02-21: qty 100

## 2020-02-21 MED ORDER — LIDOCAINE HCL (PF) 2 % IJ SOLN
INTRAMUSCULAR | Status: AC
  Filled 2020-02-21: qty 10

## 2020-02-21 MED ORDER — ACETAMINOPHEN 325 MG PO TABS: 325.0000 mg | ORAL_TABLET | ORAL | Status: DC | PRN

## 2020-02-21 MED ORDER — ONDANSETRON HCL 4 MG/2ML IJ SOLN: 4.0000 mg | Freq: Four times a day (QID) | INTRAMUSCULAR | Status: DC | PRN

## 2020-02-21 MED ORDER — CHLORHEXIDINE GLUCONATE 0.12 % MT SOLN
OROMUCOSAL | Status: AC
  Administered 2020-02-21: 15 mL via OROMUCOSAL
  Filled 2020-02-21: qty 15

## 2020-02-21 MED ORDER — EPHEDRINE 5 MG/ML INJ
INTRAVENOUS | Status: AC
  Filled 2020-02-21: qty 10

## 2020-02-21 MED ORDER — ONDANSETRON HCL 4 MG/2ML IJ SOLN: 4.0000 mg | Freq: Once | INTRAMUSCULAR | Status: DC | PRN

## 2020-02-21 MED ORDER — FENTANYL CITRATE (PF) 100 MCG/2ML IJ SOLN: 25.0000 ug | INTRAMUSCULAR | Status: DC | PRN

## 2020-02-21 SURGICAL SUPPLY — 27 items
APL PRP STRL LF DISP 70% ISPRP (MISCELLANEOUS) ×1
BAG DECANTER FOR FLEXI CONT (MISCELLANEOUS) ×2 IMPLANT
BLADE PHOTON ILLUMINATED (MISCELLANEOUS) ×2 IMPLANT
CABLE SURG 12 DISP A/V CHANNEL (MISCELLANEOUS) ×2 IMPLANT
CANISTER SUCT 1200ML W/VALVE (MISCELLANEOUS) ×2 IMPLANT
CHLORAPREP W/TINT 26 (MISCELLANEOUS) ×2 IMPLANT
COVER LIGHT HANDLE STERIS (MISCELLANEOUS) ×4 IMPLANT
COVER MAYO STAND REUSABLE (DRAPES) ×2 IMPLANT
COVER WAND RF STERILE (DRAPES) ×2 IMPLANT
DRSG TEGADERM 4X4.75 (GAUZE/BANDAGES/DRESSINGS) ×2 IMPLANT
DRSG TELFA 4X3 1S NADH ST (GAUZE/BANDAGES/DRESSINGS) ×2 IMPLANT
ELECT REM PT RETURN 9FT ADLT (ELECTROSURGICAL) ×2
ELECTRODE REM PT RTRN 9FT ADLT (ELECTROSURGICAL) ×1 IMPLANT
GLOVE BIO SURGEON STRL SZ7.5 (GLOVE) ×2 IMPLANT
GLOVE BIO SURGEON STRL SZ8 (GLOVE) ×2 IMPLANT
GOWN STRL REUS W/ TWL LRG LVL3 (GOWN DISPOSABLE) ×1 IMPLANT
GOWN STRL REUS W/ TWL XL LVL3 (GOWN DISPOSABLE) ×1 IMPLANT
GOWN STRL REUS W/TWL LRG LVL3 (GOWN DISPOSABLE) ×2
GOWN STRL REUS W/TWL XL LVL3 (GOWN DISPOSABLE) ×2
ICD EVERA XT MRI DF1  DDMB1D1 (ICD Generator) ×1 IMPLANT
ICD EVERA XT MRI DF1 DDMB1D1 (ICD Generator) ×1 IMPLANT
KIT TURNOVER KIT A (KITS) ×2 IMPLANT
KIT WRENCH (KITS) ×2 IMPLANT
MARKER SKIN DUAL TIP RULER LAB (MISCELLANEOUS) ×2 IMPLANT
PACK PACE INSERTION (MISCELLANEOUS) ×2 IMPLANT
PAD ONESTEP ZOLL R SERIES ADT (MISCELLANEOUS) ×2 IMPLANT
STRAP SAFETY 5IN WIDE (MISCELLANEOUS) ×2 IMPLANT

## 2020-02-21 NOTE — Anesthesia Preprocedure Evaluation (Signed)
Anesthesia Evaluation  Patient identified by MRN, date of birth, ID band Patient awake    Reviewed: Allergy & Precautions, NPO status , Patient's Chart, lab work & pertinent test results  History of Anesthesia Complications Negative for: history of anesthetic complications  Airway Mallampati: II  TM Distance: >3 FB Neck ROM: Full    Dental  (+) Edentulous Upper, Edentulous Lower   Pulmonary sleep apnea , COPD, Current SmokerPatient did not abstain from smoking.,    breath sounds clear to auscultation- rhonchi (-) wheezing      Cardiovascular hypertension, Pt. on medications + CAD, + Past MI, + Cardiac Stents (2017) and +CHF  (-) CABG + pacemaker  Rhythm:Regular Rate:Normal - Systolic murmurs and - Diastolic murmurs    Neuro/Psych neg Seizures negative neurological ROS  negative psych ROS   GI/Hepatic Neg liver ROS, PUD, GERD  ,  Endo/Other  diabetes, Oral Hypoglycemic Agents  Renal/GU negative Renal ROS     Musculoskeletal negative musculoskeletal ROS (+)   Abdominal (+) - obese,   Peds  Hematology  (+) anemia ,   Anesthesia Other Findings Past Medical History: No date: CAD (coronary artery disease)     Comment:  s/p stents in 2017 No date: Chronic systolic CHF (congestive heart failure) (HCC) No date: COPD (chronic obstructive pulmonary disease) (HCC) No date: Diabetes ([place]) No date: GERD (gastroesophageal reflux disease) No date: Heart attack ([place]) No date: Hyperlipidemia No date: Hypertension No date: Presence of permanent cardiac pacemaker     Comment:  and defibrillator No date: Shortness of breath dyspnea No date: Sleep apnea'  Reproductive/Obstetrics                             Anesthesia Physical Anesthesia Plan  ASA: III  Anesthesia Plan: General   Post-op Pain Management:    Induction: Intravenous  PONV Risk Score and Plan: 0 and Propofol infusion  Airway  Management Planned: Natural Airway  Additional Equipment:   Intra-op Plan:   Post-operative Plan:   Informed Consent: I have reviewed the patients History and Physical, chart, labs and discussed the procedure including the risks, benefits and alternatives for the proposed anesthesia with the patient or authorized representative who has indicated his/her understanding and acceptance.     Dental advisory given  Plan Discussed with: CRNA and Anesthesiologist  Anesthesia Plan Comments:         Anesthesia Quick Evaluation

## 2020-02-21 NOTE — Anesthesia Postprocedure Evaluation (Signed)
Anesthesia Post Note  Patient: John Frank  Procedure(s) Performed: ICD GENERATOR CHANGE (Left )  Patient location during evaluation: PACU Anesthesia Type: General Level of consciousness: awake and alert and oriented Pain management: pain level controlled Vital Signs Assessment: post-procedure vital signs reviewed and stable Respiratory status: spontaneous breathing, nonlabored ventilation and respiratory function stable Cardiovascular status: blood pressure returned to baseline and stable Postop Assessment: no signs of nausea or vomiting Anesthetic complications: no   No complications documented.   Last Vitals:  Vitals:   02/21/20 1445 02/21/20 1448  BP:  106/70  Pulse: 86   Resp: 16   Temp: 36.4 C   SpO2: 98%     Last Pain:  Vitals:   02/21/20 1445  TempSrc:   PainSc: 0-No pain                 Dion Parrow

## 2020-02-21 NOTE — Progress Notes (Signed)
Inpatient Diabetes Program Recommendations  AACE/ADA: New Consensus Statement on Inpatient Glycemic Control (2015)  Target Ranges:  Prepandial:   less than 140 mg/dL      Peak postprandial:   less than 180 mg/dL (1-2 hours)      Critically ill patients:  140 - 180 mg/dL   Lab Results  Component Value Date   GLUCAP 325 (H) 02/21/2020   HGBA1C 7.2 (H) 05/25/2018   Inpatient Diabetes Program Recommendations:   Spoke with patient @ bedside in PACU. Reviewed current A1c of 15.6 on 01/13/20 which increased from 8.2 on 08/30/19. Patient states he drinks regular soft drinks but willing to change to sugar free and has splenda @ home to use with his tea. Patient denies anything else changed @ this time but told his sister Ivin Booty patient has lost 20-30 lbs over the past 2 months time. Sister Ivin Booty plans to call Dr. Sherren Mocha office to attempt to get an office visit this week instead of waiting until next week and also plans to contact patient's PCP office and give updated information regarding patient's surgery. Sister plans to go to the endocrinologist's appointment with patient. Patient states willingness to take insulin but would like his sister to learn how to administer the insulin to help him.  Thank you, Nani Gasser. Johnattan Strassman, RN, MSN, CDE  Diabetes Coordinator Inpatient Glycemic Control Team Team Pager [phone] (8am-5pm) 02/21/2020 2:51 PM

## 2020-02-21 NOTE — Transfer of Care (Signed)
Immediate Anesthesia Transfer of Care Note  Patient: John Frank  Procedure(s) Performed: ICD GENERATOR CHANGE (Left )  Patient Location: PACU  Anesthesia Type:General  Level of Consciousness: drowsy  Airway & Oxygen Therapy: Patient Spontanous Breathing and Patient connected to face mask oxygen  Post-op Assessment: Report given to RN and Post -op Vital signs reviewed and stable  Post vital signs: Reviewed and stable  Last Vitals:  Vitals Value Taken Time  BP 106/42 02/21/20 1341  Temp    Pulse 80 02/21/20 1344  Resp 18 02/21/20 1344  SpO2 100 % 02/21/20 1344  Vitals shown include unvalidated device data.  Last Pain:  Vitals:   02/21/20 1341  TempSrc:   PainSc: (P) Asleep         Complications: No complications documented.

## 2020-02-21 NOTE — Progress Notes (Signed)
Spoke with Dr. Saralyn Pilar and Dr. Randa Lynn about pt glucose 510 and 430 after 10U of novolog.. Verbal order given for a diabetic consult for pt due to lack of education and no glucometer at home.

## 2020-02-21 NOTE — Discharge Instructions (Signed)
AMBULATORY SURGERY  DISCHARGE INSTRUCTIONS   1) The drugs that you were given will stay in your system until tomorrow so for the next 24 hours you should not:  A) Drive an automobile B) Make any legal decisions C) Drink any alcoholic beverage   2) You may resume regular meals tomorrow.  Today it is better to start with liquids and gradually work up to solid foods.  You may eat anything you prefer, but it is better to start with liquids, then soup and crackers, and gradually work up to solid foods.   3) Please notify your doctor immediately if you have any unusual bleeding, trouble breathing, redness and pain at the surgery site, drainage, fever, or pain not relieved by medication.    4) Additional Instructions:        Please contact your physician with any problems or Same Day Surgery at [phone], Monday through Friday 6 am to 4 pm, or [place] at [facility] number at [phone].May remove outer bandage on 02/22/2020.  Leave Steri-Strips on.

## 2020-02-21 NOTE — Progress Notes (Addendum)
Inpatient Diabetes Program Recommendations  AACE/ADA: New Consensus Statement on Inpatient Glycemic Control (2015)  Target Ranges:  Prepandial:   less than 140 mg/dL      Peak postprandial:   less than 180 mg/dL (1-2 hours)      Critically ill patients:  140 - 180 mg/dL   Lab Results  Component Value Date   GLUCAP 433 (H) 02/21/2020   HGBA1C 7.2 (H) 05/25/2018    Review of Glycemic Control Results for AMILLION, MACCHIA (MRN [id number]) as of 02/21/2020 12:36  Ref. Range 02/21/2020 11:16 02/21/2020 11:18 02/21/2020 12:07  Glucose-Capillary Latest Ref Range: 70 - 99 mg/dL 450 (H) 513 (HH) 433 (H)   Diabetes history: DM2 Outpatient Diabetes medications: Jardiance 25 mg qd + Metformin 1 gm bid + Trulicity (lists not taking due to need for refills)  Inpatient Diabetes Program Recommendations:    Noted per chart patient's A1c increased from 8.2 08/30/19 to current 15.6 on 01/13/20 completed at [facility]. Patient has appointment 02/28/20 to see endocrinologist Dr. Mee Hives  Consider Lantus 12 units qd (0.2 units/kg x 61.2 kg = 12.2 units) Agree with current plan of IV insulin.  Order # for glucose monitoring kit: [id number]  Thank you, John Frank. Marlisa Caridi, RN, MSN, CDE  Diabetes Coordinator Inpatient Glycemic Control Team Team Pager [phone] (8am-5pm) 02/21/2020 12:45 PM

## 2020-02-21 NOTE — Op Note (Signed)
[facility]   02/21/2020                     1:41 PM  PATIENT:  John Frank    PRE-OPERATIVE DIAGNOSIS:  icd ERI  POST-OPERATIVE DIAGNOSIS:  Same  PROCEDURE:  ICD GENERATOR CHANGE  SURGEON:  Isaias Cowman, MD    ANESTHESIA:     PREOPERATIVE INDICATIONS:  [name] is a  64 y.o. male with a diagnosis of icd ERI who failed conservative measures and elected for surgical management.    The risks benefits and alternatives were discussed with the patient preoperatively including but not limited to the risks of infection, bleeding, cardiopulmonary complications, the need for revision surgery, among others, and the patient was willing to proceed.   OPERATIVE PROCEDURE: The patient was brought to the operating room in a fasting state.  The left pectoral region was prepped and draped in usual sterile manner.  Anesthesia was obtained 1% lidocaine locally.  A 6 cm incision was performed over the left pectoral region.  The existing ICD generator was retrieved electrocautery and blunt dissection.  The leads were disconnected and connected to a new dual-chamber ICD generator (Medtronic Evera MRI XT DR [id number]).  The pacemaker pocket was irrigated with gentamicin solution.  The new generator was positioned into the pocket and the pocket was closed with 2-0 and 4-0 Vicryl, respectively.  Steri-Strips and a pressure dressing were applied.  Postprocedural interrogation revealed appropriate atrial and ventricular sensing and pacing thresholds.  There were no periprocedural complications.   ICD Criteria  Current LVEF:35%. Within 12 months prior to implant: Yes   Heart failure history: Yes, Class II  Cardiomyopathy history: Yes, Non-Ischemic Cardiomyopathy.  Atrial Fibrillation/Atrial Flutter: No.  Ventricular tachycardia history: No.  Cardiac arrest history: No.  History of  syndromes with risk of sudden death: No.  Previous ICD: Yes, Reason for ICD:  Primary prevention.  Current ICD indication: Primary  PPM indication: No.   Class I or II Bradycardia indication present: No  Beta Blocker therapy for 3 or more months: Yes, prescribed.   Ace Inhibitor/ARB therapy for 3 or more months: Yes, prescribed.

## 2020-02-21 NOTE — Progress Notes (Signed)
  ICD interrogation revealed elective replacement indication, requires generator change-out.

## 2020-02-22 ENCOUNTER — Encounter: Payer: Self-pay | Admitting: Cardiology

## 2020-02-23 LAB — GLUCOSE, CAPILLARY: Glucose-Capillary: 513 mg/dL (ref 70–99)

## 2020-02-28 ENCOUNTER — Ambulatory Visit: Payer: Medicare Other | Admitting: Family

## 2020-02-29 ENCOUNTER — Encounter: Payer: Self-pay | Admitting: Family

## 2020-02-29 ENCOUNTER — Other Ambulatory Visit: Payer: Self-pay

## 2020-02-29 ENCOUNTER — Ambulatory Visit: Payer: Medicare Other | Attending: Family | Admitting: Family

## 2020-02-29 VITALS — BP 99/70 | HR 88 | Resp 16 | Ht 64.0 in | Wt 128.5 lb

## 2020-02-29 DIAGNOSIS — G4730 Sleep apnea, unspecified: Secondary | ICD-10-CM | POA: Diagnosis not present

## 2020-02-29 DIAGNOSIS — I110 Hypertensive heart disease with heart failure: Secondary | ICD-10-CM | POA: Diagnosis present

## 2020-02-29 DIAGNOSIS — Z7984 Long term (current) use of oral hypoglycemic drugs: Secondary | ICD-10-CM | POA: Insufficient documentation

## 2020-02-29 DIAGNOSIS — Z7902 Long term (current) use of antithrombotics/antiplatelets: Secondary | ICD-10-CM | POA: Insufficient documentation

## 2020-02-29 DIAGNOSIS — Z955 Presence of coronary angioplasty implant and graft: Secondary | ICD-10-CM | POA: Insufficient documentation

## 2020-02-29 DIAGNOSIS — J449 Chronic obstructive pulmonary disease, unspecified: Secondary | ICD-10-CM | POA: Diagnosis not present

## 2020-02-29 DIAGNOSIS — Z950 Presence of cardiac pacemaker: Secondary | ICD-10-CM | POA: Diagnosis not present

## 2020-02-29 DIAGNOSIS — K219 Gastro-esophageal reflux disease without esophagitis: Secondary | ICD-10-CM | POA: Diagnosis not present

## 2020-02-29 DIAGNOSIS — E119 Type 2 diabetes mellitus without complications: Secondary | ICD-10-CM | POA: Diagnosis not present

## 2020-02-29 DIAGNOSIS — E785 Hyperlipidemia, unspecified: Secondary | ICD-10-CM | POA: Diagnosis not present

## 2020-02-29 DIAGNOSIS — F17210 Nicotine dependence, cigarettes, uncomplicated: Secondary | ICD-10-CM | POA: Diagnosis not present

## 2020-02-29 DIAGNOSIS — Z8249 Family history of ischemic heart disease and other diseases of the circulatory system: Secondary | ICD-10-CM | POA: Insufficient documentation

## 2020-02-29 DIAGNOSIS — I2510 Atherosclerotic heart disease of native coronary artery without angina pectoris: Secondary | ICD-10-CM | POA: Diagnosis not present

## 2020-02-29 DIAGNOSIS — I252 Old myocardial infarction: Secondary | ICD-10-CM | POA: Diagnosis not present

## 2020-02-29 DIAGNOSIS — I5022 Chronic systolic (congestive) heart failure: Secondary | ICD-10-CM | POA: Insufficient documentation

## 2020-02-29 DIAGNOSIS — Z833 Family history of diabetes mellitus: Secondary | ICD-10-CM | POA: Diagnosis not present

## 2020-02-29 DIAGNOSIS — Z79899 Other long term (current) drug therapy: Secondary | ICD-10-CM | POA: Insufficient documentation

## 2020-02-29 DIAGNOSIS — Z720 Tobacco use: Secondary | ICD-10-CM

## 2020-02-29 DIAGNOSIS — I10 Essential (primary) hypertension: Secondary | ICD-10-CM

## 2020-02-29 NOTE — Patient Instructions (Signed)
Diabetes Mellitus and Nutrition, Adult When you have diabetes (diabetes mellitus), it is very important to have healthy eating habits because your blood sugar (glucose) levels are greatly affected by what you eat and drink. Eating healthy foods in the appropriate amounts, at about the same times every day, can help you:  Control your blood glucose.  Lower your risk of heart disease.  Improve your blood pressure.  Reach or maintain a healthy weight. Every person with diabetes is different, and each person has different needs for a meal plan. Your health care provider may recommend that you work with a diet and nutrition specialist (dietitian) to make a meal plan that is best for you. Your meal plan may vary depending on factors such as:  The calories you need.  The medicines you take.  Your weight.  Your blood glucose, blood pressure, and cholesterol levels.  Your activity level.  Other health conditions you have, such as heart or kidney disease. How do carbohydrates affect me? Carbohydrates, also called carbs, affect your blood glucose level more than any other type of food. Eating carbs naturally raises the amount of glucose in your blood. Carb counting is a method for keeping track of how many carbs you eat. Counting carbs is important to keep your blood glucose at a healthy level, especially if you use insulin or take certain oral diabetes medicines. It is important to know how many carbs you can safely have in each meal. This is different for every person. Your dietitian can help you calculate how many carbs you should have at each meal and for each snack. Foods that contain carbs include:  Bread, cereal, rice, pasta, and crackers.  Potatoes and corn.  Peas, beans, and lentils.  Milk and yogurt. Fruit and juice.  Desserts, such as cakes, cookies, ice cream, and candy. How does alcohol affect me? Alcohol can cause a sudden decrease in blood glucose (hypoglycemia), especially  if you use insulin or take certain oral diabetes medicines. Hypoglycemia can be a life-threatening condition. Symptoms of hypoglycemia (sleepiness, dizziness, and confusion) are similar to symptoms of having too much alcohol. If your health care provider says that alcohol is safe for you, follow these guidelines:  Limit alcohol intake to no more than 1 drink per day for nonpregnant women and 2 drinks per day for men. One drink equals 12 oz of beer, 5 oz of wine, or 1 oz of hard liquor.  Do not drink on an empty stomach.  Keep yourself hydrated with water, diet soda, or unsweetened iced tea.  Keep in mind that regular soda, juice, and other mixers may contain a lot of sugar and must be counted as carbs. What are tips for following this plan?  Reading food labels  Start by checking the serving size on the "Nutrition Facts" label of packaged foods and drinks. The amount of calories, carbs, fats, and other nutrients listed on the label is based on one serving of the item. Many items contain more than one serving per package.  Check the total grams (g) of carbs in one serving. You can calculate the number of servings of carbs in one serving by dividing the total carbs by 15. For example, if a food has 30 g of total carbs, it would be equal to 2 servings of carbs.  Check the number of grams (g) of saturated and trans fats in one serving. Choose foods that have low or no amount of these fats.  Check the number of milligrams (  mg) of salt (sodium) in one serving. Most people should limit total sodium intake to less than 2,300 mg per day.  Always check the nutrition information of foods labeled as "low-fat" or "nonfat". These foods may be higher in added sugar or refined carbs and should be avoided.  Talk to your dietitian to identify your daily goals for nutrients listed on the label. Shopping  Avoid buying canned, premade, or processed foods. These foods tend to be high in fat, sodium, and added  sugar.  Shop around the outside edge of the grocery store. This includes fresh fruits and vegetables, bulk grains, fresh meats, and fresh dairy. Cooking  Use low-heat cooking methods, such as baking, instead of high-heat cooking methods like deep frying.  Cook using healthy oils, such as olive, canola, or sunflower oil.  Avoid cooking with butter, cream, or high-fat meats. Meal planning  Eat meals and snacks regularly, preferably at the same times every day. Avoid going long periods of time without eating.  Eat foods high in fiber, such as fresh fruits, vegetables, beans, and whole grains. Talk to your dietitian about how many servings of carbs you can eat at each meal.  Eat 4-6 ounces (oz) of lean protein each day, such as lean meat, chicken, fish, eggs, or tofu. One oz of lean protein is equal to: ? 1 oz of meat, chicken, or fish. ? 1 egg. ?  cup of tofu.  Eat some foods each day that contain healthy fats, such as avocado, nuts, seeds, and fish. Lifestyle  Check your blood glucose regularly.  Exercise regularly as told by your health care provider. This may include: ? 150 minutes of moderate-intensity or vigorous-intensity exercise each week. This could be brisk walking, biking, or water aerobics. ? Stretching and doing strength exercises, such as yoga or weightlifting, at least 2 times a week.  Take medicines as told by your health care provider.  Do not use any products that contain nicotine or tobacco, such as cigarettes and e-cigarettes. If you need help quitting, ask your health care provider.  Work with a Social worker or diabetes educator to identify strategies to manage stress and any emotional and social challenges. Questions to ask a health care provider  Do I need to meet with a diabetes educator?  Do I need to meet with a dietitian?  What number can I call if I have questions?  When are the best times to check my blood glucose? Where to find more  information:  American Diabetes Association: diabetes.org  Academy of Nutrition and Dietetics: www.eatright.[employer] of Diabetes and Digestive and Kidney Diseases (NIH): [employer] Summary  A healthy meal plan will help you control your blood glucose and maintain a healthy lifestyle.  Working with a diet and nutrition specialist (dietitian) can help you make a meal plan that is best for you.  Keep in mind that carbohydrates (carbs) and alcohol have immediate effects on your blood glucose levels. It is important to count carbs and to use alcohol carefully. This information is not intended to replace advice given to you by your health care provider. Make sure you discuss any questions you have with your health care provider. Document Revised: 04/10/2017 Document Reviewed: 06/02/2016 Elsevier Patient Education  2020 [place] Eating Plan DASH stands for "Dietary Approaches to Stop Hypertension." The DASH eating plan is a healthy eating plan that has been shown to reduce high blood pressure (hypertension). It may also reduce your risk for type 2  diabetes, heart disease, and stroke. The DASH eating plan may also help with weight loss. What are tips for following this plan?  General guidelines  Avoid eating more than 2,300 mg (milligrams) of salt (sodium) a day. If you have hypertension, you may need to reduce your sodium intake to 1,500 mg a day.  Limit alcohol intake to no more than 1 drink a day for nonpregnant women and 2 drinks a day for men. One drink equals 12 oz of beer, 5 oz of wine, or 1 oz of hard liquor.  Work with your health care provider to maintain a healthy body weight or to lose weight. Ask what an ideal weight is for you.  Get at least 30 minutes of exercise that causes your heart to beat faster (aerobic exercise) most days of the week. Activities may include walking, swimming, or biking.  Work with your health care provider or diet and  nutrition specialist (dietitian) to adjust your eating plan to your individual calorie needs. Reading food labels   Check food labels for the amount of sodium per serving. Choose foods with less than 5 percent of the Daily Value of sodium. Generally, foods with less than 300 mg of sodium per serving fit into this eating plan.  To find whole grains, look for the word "whole" as the first word in the ingredient list. Shopping  Buy products labeled as "low-sodium" or "no salt added."  Buy fresh foods. Avoid canned foods and premade or frozen meals. Cooking  Avoid adding salt when cooking. Use salt-free seasonings or herbs instead of table salt or sea salt. Check with your health care provider or pharmacist before using salt substitutes.  Do not fry foods. Cook foods using healthy methods such as baking, boiling, grilling, and broiling instead.  Cook with heart-healthy oils, such as olive, canola, soybean, or sunflower oil. Meal planning  Eat a balanced diet that includes: ? 5 or more servings of fruits and vegetables each day. At each meal, try to fill half of your plate with fruits and vegetables. ? Up to 6-8 servings of whole grains each day. ? Less than 6 oz of lean meat, poultry, or fish each day. A 3-oz serving of meat is about the same size as a deck of cards. One egg equals 1 oz. ? 2 servings of low-fat dairy each day. ? A serving of nuts, seeds, or beans 5 times each week. ? Heart-healthy fats. Healthy fats called Omega-3 fatty acids are found in foods such as flaxseeds and coldwater fish, like sardines, salmon, and mackerel.  Limit how much you eat of the following: ? Canned or prepackaged foods. ? Food that is high in trans fat, such as fried foods. ? Food that is high in saturated fat, such as fatty meat. ? Sweets, desserts, sugary drinks, and other foods with added sugar. ? Full-fat dairy products.  Do not salt foods before eating.  Try to eat at least 2 vegetarian  meals each week.  Eat more home-cooked food and less restaurant, buffet, and fast food.  When eating at a restaurant, ask that your food be prepared with less salt or no salt, if possible. What foods are recommended? The items listed may not be a complete list. Talk with your dietitian about what dietary choices are best for you. Grains Whole-grain or whole-wheat bread. Whole-grain or whole-wheat pasta. Brown rice. [place]. Bulgur. Whole-grain and low-sodium cereals. Pita bread. Low-fat, low-sodium crackers. Whole-wheat flour tortillas. Vegetables Fresh or frozen vegetables (  raw, steamed, roasted, or grilled). Low-sodium or reduced-sodium tomato and vegetable juice. Low-sodium or reduced-sodium tomato sauce and tomato paste. Low-sodium or reduced-sodium canned vegetables. Fruits All fresh, dried, or frozen fruit. Canned fruit in natural juice (without added sugar). Meat and other protein foods Skinless chicken or [place]. Ground chicken or [place]. Pork with fat trimmed off. Fish and seafood. Egg whites. Dried beans, peas, or lentils. Unsalted nuts, nut butters, and seeds. Unsalted canned beans. Lean cuts of beef with fat trimmed off. Low-sodium, lean deli meat. Dairy Low-fat (1%) or fat-free (skim) milk. Fat-free, low-fat, or reduced-fat cheeses. Nonfat, low-sodium ricotta or cottage cheese. Low-fat or nonfat yogurt. Low-fat, low-sodium cheese. Fats and oils Soft margarine without trans fats. Vegetable oil. Low-fat, reduced-fat, or light mayonnaise and salad dressings (reduced-sodium). Canola, safflower, olive, soybean, and sunflower oils. Avocado. Seasoning and other foods Herbs. Spices. Seasoning mixes without salt. Unsalted popcorn and pretzels. Fat-free sweets. What foods are not recommended? The items listed may not be a complete list. Talk with your dietitian about what dietary choices are best for you. Grains Baked goods made with fat, such as croissants, muffins, or some  breads. Dry pasta or rice meal packs. Vegetables Creamed or fried vegetables. Vegetables in a cheese sauce. Regular canned vegetables (not low-sodium or reduced-sodium). Regular canned tomato sauce and paste (not low-sodium or reduced-sodium). Regular tomato and vegetable juice (not low-sodium or reduced-sodium). Angie Fava. Olives. Fruits Canned fruit in a light or heavy syrup. Fried fruit. Fruit in cream or butter sauce. Meat and other protein foods Fatty cuts of meat. Ribs. Fried meat. Berniece Salines. Sausage. Bologna and other processed lunch meats. Salami. Fatback. Hotdogs. Bratwurst. Salted nuts and seeds. Canned beans with added salt. Canned or smoked fish. Whole eggs or egg yolks. Chicken or [place] with skin. Dairy Whole or 2% milk, cream, and half-and-half. Whole or full-fat cream cheese. Whole-fat or sweetened yogurt. Full-fat cheese. Nondairy creamers. Whipped toppings. Processed cheese and cheese spreads. Fats and oils Butter. Stick margarine. Lard. Shortening. Ghee. Bacon fat. Tropical oils, such as coconut, palm kernel, or palm oil. Seasoning and other foods Salted popcorn and pretzels. Onion salt, garlic salt, seasoned salt, table salt, and sea salt. Worcestershire sauce. Tartar sauce. Barbecue sauce. Teriyaki sauce. Soy sauce, including reduced-sodium. Steak sauce. Canned and packaged gravies. Fish sauce. Oyster sauce. Cocktail sauce. Horseradish that you find on the shelf. Ketchup. Mustard. Meat flavorings and tenderizers. Bouillon cubes. Hot sauce and Tabasco sauce. Premade or packaged marinades. Premade or packaged taco seasonings. Relishes. Regular salad dressings. Where to find more information:  National Heart, Lung, and [place]: https://wilson-eaton.com/  American Heart Association: www.heart.org Summary  The DASH eating plan is a healthy eating plan that has been shown to reduce high blood pressure (hypertension). It may also reduce your risk for type 2 diabetes, heart disease, and  stroke.  With the DASH eating plan, you should limit salt (sodium) intake to 2,300 mg a day. If you have hypertension, you may need to reduce your sodium intake to 1,500 mg a day.  When on the DASH eating plan, aim to eat more fresh fruits and vegetables, whole grains, lean proteins, low-fat dairy, and heart-healthy fats.  Work with your health care provider or diet and nutrition specialist (dietitian) to adjust your eating plan to your individual calorie needs. This information is not intended to replace advice given to you by your health care provider. Make sure you discuss any questions you have with your health care provider. Document Revised: 04/10/2017 Document Reviewed: 04/21/2016  Elsevier Patient Education  [employer].

## 2020-02-29 NOTE — Progress Notes (Signed)
Patient ID: John Frank, male    DOB: Mar 16, 1956, 63 y.o.   MRN: [id number]  Congestive Heart Failure Pertinent negatives include no abdominal pain, chest pain, fatigue, palpitations or shortness of breath.    Mr John Frank is a 64 y/o male with a history of CAD, DM, hyperlipidemia, HTN, GERD, COPD, obstructive sleep apnea, chronic heart failure and current tobacco use.   Echo report from 11/19/2018 reviewed and showed an EF of 40-45%. Echo report from 04/13/17 reviewed and showed an EF of 30% along with mild MR/TR.  Was in the ED 01/15/20 due to chest pain after getting into a verbal altercation with someone. Work-up negative and he was released.    He presents today with a chief complaint of a follow-up visit. He currently denies any symptoms and specifically denies any difficulty sleeping, dizziness, abdominal distention, palpitations, pedal edema, chest pain, wheezing, shortness of breath, cough, fatigue or weight gain.   Recently had his pacemaker batteries changed and reports he is recovering well from the procedure.   Past Medical History:  Diagnosis Date  . CAD (coronary artery disease)    s/p stents in 2017  . Chronic systolic CHF (congestive heart failure) ([place])   . COPD (chronic obstructive pulmonary disease) ([place])   . Diabetes (John Frank)   . GERD (gastroesophageal reflux disease)   . Heart attack (John Frank)   . Hyperlipidemia   . Hypertension   . Presence of permanent cardiac pacemaker    and defibrillator  . Shortness of breath dyspnea   . Sleep apnea    Past Surgical History:  Procedure Laterality Date  . CARDIAC PACEMAKER PLACEMENT    . cardiac stents    . CARDIAC SURGERY     defibrillator and pacemaker  . CORONARY ANGIOPLASTY    . ESOPHAGOGASTRODUODENOSCOPY N/A 09/19/2014   Procedure: ESOPHAGOGASTRODUODENOSCOPY (EGD);  Surgeon: John Lame, MD;  Location: [facility] ENDOSCOPY;  Service: Endoscopy;  Laterality: N/A;  . ESOPHAGOGASTRODUODENOSCOPY (EGD) WITH PROPOFOL N/A 09/28/2017    Procedure: ESOPHAGOGASTRODUODENOSCOPY (EGD) WITH PROPOFOL;  Surgeon: John Manifold, MD;  Location: [facility] ENDOSCOPY;  Service: Endoscopy;  Laterality: N/A;  . ESOPHAGOGASTRODUODENOSCOPY (EGD) WITH PROPOFOL N/A 04/19/2018   Procedure: ESOPHAGOGASTRODUODENOSCOPY (EGD) WITH PROPOFOL with Gastric Mapping;  Surgeon: John Bellows, MD;  Location: [facility] ENDOSCOPY;  Service: Gastroenterology;  Laterality: N/A;  . ESOPHAGOGASTRODUODENOSCOPY (EGD) WITH PROPOFOL N/A 12/13/2018   Procedure: ESOPHAGOGASTRODUODENOSCOPY (EGD) WITH PROPOFOL;  Surgeon: John Bellows, MD;  Location: [facility] ENDOSCOPY;  Service: Gastroenterology;  Laterality: N/A;  . ESOPHAGOGASTRODUODENOSCOPY (EGD) WITH PROPOFOL N/A 03/31/2019   Procedure: ESOPHAGOGASTRODUODENOSCOPY (EGD) WITH PROPOFOL;  Surgeon: John Landsman, MD;  Location: [facility] ENDOSCOPY;  Service: Gastroenterology;  Laterality: N/A;  . IMPLANTABLE CARDIOVERTER DEFIBRILLATOR (ICD) GENERATOR CHANGE Left 02/21/2020   Procedure: ICD GENERATOR CHANGE;  Surgeon: John Cowman, MD;  Location: [facility] ORS;  Service: Cardiovascular;  Laterality: Left;   Family History  Problem Relation Age of Onset  . Diabetes Mother   . Heart attack Father 69   Social History   Tobacco Use  . Smoking status: Current Every Day Smoker    Packs/day: 0.25    Years: 40.00    Pack years: 10.00    Types: Cigarettes  . Smokeless tobacco: Former Network engineer Use Topics  . Alcohol use: No   No Known Allergies  Prior to Admission medications   Medication Sig Start Date End Date Taking? Authorizing Provider  albuterol (PROVENTIL HFA;VENTOLIN HFA) 108 (90 Base) MCG/ACT inhaler Inhale 2 puffs into the lungs every 6 (  six) hours as needed for wheezing or shortness of breath. 05/26/17  Yes Sainani, John Heman, MD  atorvastatin (LIPITOR) 40 MG tablet TAKE 1 TABLET (40 MG TOTAL) BY MOUTH DAILY. Patient taking differently: Take 40 mg by mouth daily.  03/08/18  Yes Cannady, John T, NP  buPROPion  (WELLBUTRIN) 75 MG tablet Take 75 mg by mouth daily. 03/26/16 02/14/21 Yes [provider]  clopidogrel (PLAVIX) 75 MG tablet TAKE 1 TABLET (75 MG TOTAL) BY MOUTH DAILY. 12/08/16  Yes Frank, John Mood, NP  ENTRESTO 49-51 MG TAKE 1 TABLET BY MOUTH TWICE A DAY Patient taking differently: Take 1 tablet by mouth 2 (two) times daily.  06/21/19  Yes Frank, John A, FNP  ipratropium-albuterol (DUONEB) 0.5-2.5 (3) MG/3ML SOLN Take 3 mLs by nebulization every 6 (six) hours as needed (wheezing; shortness of breath).   Yes [provider]  JARDIANCE 25 MG TABS tablet Take 25 mg by mouth daily. 09/24/19  Yes [provider]  levocetirizine (XYZAL) 5 MG tablet Take 5 mg by mouth every evening.  11/10/18  Yes [provider]  metFORMIN (GLUCOPHAGE) 1000 MG tablet TAKE 1 TABLET (1,000 MG TOTAL) BY MOUTH 2 (TWO) TIMES DAILY WITH A MEAL. Patient taking differently: Take 1,000 mg by mouth in the morning and at bedtime.  09/12/16  Yes Kathrine Haddock, NP  metoprolol succinate (TOPROL-XL) 100 MG 24 hr tablet Take 1 tablet (100 mg total) by mouth daily. 06/28/19  Yes Frank, John Kluver A, FNP  nitroGLYCERIN (NITROSTAT) 0.4 MG SL tablet Place 0.4 mg under the tongue every 5 (five) minutes x 3 doses as needed for chest pain. 01/03/20  Yes [provider]  ondansetron (ZOFRAN ODT) 4 MG disintegrating tablet Take 1 tablet (4 mg total) by mouth every 8 (eight) hours as needed. 09/07/19  Yes Alfred Frank, [place], MD  ONE [facility] ULTRA TEST test strip USE TO TEST BLOOD SUGAR ONCE DAILY 10/07/16  Yes Frank, John P, DO  pantoprazole (PROTONIX) 40 MG tablet Take 1 tablet (40 mg total) by mouth 2 (two) times daily before a meal. 12/13/18 02/14/21 Yes Frank, John Due, MD  spironolactone (ALDACTONE) 25 MG tablet TAKE 1/2 TABLET BY MOUTH EVERY DAY Patient taking differently: Take 12.5 mg by mouth daily.  05/02/19  Yes John Frank A, FNP  sucralfate (CARAFATE) 1 g tablet Take 1 g by mouth 4 (four) times daily  -  with meals and at bedtime.  05/15/17 02/14/21 Yes [provider]  TRULICITY 1.5 NG/2.9BM SOPN SMARTSIG:0.5 Milliliter(s) SUB-Q Once a Week 08/30/19  Yes [provider]     Review of Systems  Constitutional: Negative for appetite change, fatigue and fever.  HENT: Negative for congestion, postnasal drip and sore throat.   Eyes: Negative.   Respiratory: Negative for cough, chest tightness, shortness of breath and wheezing.   Cardiovascular: Negative for chest pain, palpitations and leg swelling.  Gastrointestinal: Negative for abdominal distention and abdominal pain.  Endocrine: Negative.   Genitourinary: Negative.   Musculoskeletal: Negative for back pain and neck pain.  Skin: Negative.   Allergic/Immunologic: Negative.   Neurological: Negative for dizziness, light-headedness and headaches.  Hematological: Negative for adenopathy. Does not bruise/bleed easily.  Psychiatric/Behavioral: Negative for dysphoric mood and sleep disturbance (wearing CPAP; sleeping on 2 pillows). The patient is not nervous/anxious.    Vitals:   02/29/20 0818  BP: 99/70  Pulse: 88  Resp: 16  SpO2: 94%  Weight: 128 lb 8 oz (58.3 kg)  Height: 5\' 4"  (1.626 m)  Wt Readings from Last 3 Encounters:  02/29/20 128 lb 8 oz (58.3 kg)  02/21/20 135 lb (61.2 kg)  01/15/20 138 lb (62.6 kg)   Lab Results  Component Value Date   CREATININE 0.85 02/21/2020   CREATININE 0.62 01/15/2020   CREATININE 0.79 01/04/2020    Physical Exam Vitals and nursing note reviewed.  Constitutional:      Appearance: He is well-developed.  HENT:     Head: Normocephalic and atraumatic.  Neck:     Vascular: No JVD.  Cardiovascular:     Rate and Rhythm: Normal rate and regular rhythm.  Pulmonary:     Effort: Pulmonary effort is normal. No respiratory distress.     Breath sounds: Wheezing (expiratory in lower lung fields) present. No rhonchi or rales.  Abdominal:     General: There is no distension.      Palpations: Abdomen is soft.     Tenderness: There is no abdominal tenderness.  Musculoskeletal:        General: No tenderness.     Cervical back: Normal range of motion and neck supple.  Skin:    General: Skin is warm and dry.  Neurological:     Mental Status: He is alert and oriented to person, place, and time.  Psychiatric:        Behavior: Behavior normal.        Thought Content: Thought content normal.    Assessment & Plan:  1: Chronic heart failure with reduced ejection fraction- - NYHA class I - euvolemic today - weighing daily; reminded to call for an overnight weight gain of >2 pounds or a weekly weight gain of >5 pounds.  - weight decreased approximately 30Lbs since last visit 6 months ago, PCP and Endocrinology aware and have done CT scans for this reason.  - not adding salt; using Mrs. Dash; admits that he's not reading food labels; reminded him to keep his daily sodium intake to 2000mg  a day. Does eat quite a bit of processed food and carbs; rinses canned vegetables  - saw cardiology Clayborn Bigness) 01/03/20 - saw pulmonologist Raul Del) 11/11/2018 - BNP 01/26/2020 was 103.1 - currently has pacemaker/defibrillator, recently had his batteries changed in pacemaker  2: HTN- - BP Low today in office - saw PCP Doy Hutching) 02/23/20  - BMP 02/21/20 reviewed and showed sodium 130, potassium 4.7, creatinine 0.85 and GFR >60  3: Diabetes- - saw endocrinology Honor Junes) 02/23/20 - nonfasting glucose in clinic today was unable to be checked due to meter disfunction - A1c 01/13/20 was 15.6% -Information given on diabetic diet plan and extensive diet information discussed including reduction of carbohydrates, sugars, and processed foods. -Patient referred to lifestyle center due to educational needs regarding diet management, food planning, and symptom management.   4: Tobacco use- - currently smoking 4-5 cigarettes daily; more if he's stressed or upset - complete cessation discussed for  3 minutes with patient, patient not interested in stopping smoking at this time.    Patient did not bring his medications nor a list. Each medication was verbally reviewed with the patient and he was encouraged to bring the bottles to every visit to confirm accuracy of list.  Return in 6 months or sooner for any questions/problems before then.

## 2020-03-02 ENCOUNTER — Encounter: Payer: Self-pay | Admitting: Cardiology

## 2020-03-08 ENCOUNTER — Ambulatory Visit: Payer: Medicare Other | Admitting: Dietician

## 2020-03-09 ENCOUNTER — Encounter: Payer: Medicare Other | Attending: "Endocrinology | Admitting: Dietician

## 2020-03-09 ENCOUNTER — Other Ambulatory Visit: Payer: Self-pay

## 2020-03-09 ENCOUNTER — Encounter: Payer: Self-pay | Admitting: Dietician

## 2020-03-09 VITALS — Ht 60.0 in | Wt 127.0 lb

## 2020-03-09 DIAGNOSIS — E1165 Type 2 diabetes mellitus with hyperglycemia: Secondary | ICD-10-CM | POA: Diagnosis not present

## 2020-03-09 DIAGNOSIS — E119 Type 2 diabetes mellitus without complications: Secondary | ICD-10-CM

## 2020-03-09 NOTE — Patient Instructions (Signed)
   Cut back on cookies, brownies, and candy; switch to snacking on more fruit   Don't skip lunch- try to have a sandwich, soup, salad   Portion control your carbs at meals (1 cup of mashed potatoes, mac and cheese, rice)

## 2020-03-09 NOTE — Progress Notes (Signed)
Medical Nutrition Therapy: Visit start time: 1100  end time: 1200  Assessment:  Diagnosis: type 2 diabetes Past medical history: hyperlipidemia, hypertension  Psychosocial issues/ stress concerns: pt rates stress level "moderate" and feels "very well" about stress management skills   Preferred learning method:  . Auditory . Visual . Hands-on  Current weight: 127.0 lbs  Height: 5' Medications, supplements: reconciled in medical record  Labs HgA1c- 15.6 (H) on 01/13/2020 Smokes 1/4-1/2 PPD  Progress and evaluation:   FBG yesterday morning 154, pt could not recall typical range of BGs; states he tries to check every morning     Pt states he used to weigh 150s lbs, 127.0 lbs   Pt consumes sweets daily- brownies, candy, cookies, and other sweets   Pt expressed he was confused about what foods have carbs and if he should eat any carbs   Physical activity: 30 min walk, 5 days/week   Dietary Intake:  Usual eating pattern includes 2 meals and 2-3 snacks per day. Dining out frequency: 1 meals per month.  Breakfast: eggs, wheat bread, bacon Snack: fruit cocktail, graham crackers with PB   Lunch: skips Snack: graham crackers with sweet milk  Supper: green beans, green peas with carrots/boiled potato with butter/mac and cheese and chicken breast;  Snack: cup of milk and graham crackers; cheese crackers; cheese popcorn  Beverages: 1 bottle diet coke or sometimes regular coke/pepsi, 2-3 16-oz water bottles, coffee with splenda, tea with splenda    Nutrition Care Education: Basic nutrition: basic food groups, appropriate nutrient balance, appropriate meal and snack schedule, general nutrition guidelines    Weight gain: identifying healthy weight, determining reasonable weight gain rate Diabetes:  goals for BGs, appropriate meal and snack schedule, appropriate carb intake and balance, healthy carb choices, role of fiber, protein, fat Hypertension:  importance of controlling BP, identifying  high sodium foods Hyperlipidemia: healthy and unhealthy fats  Nutritional Diagnosis:  NB-1.7 Undesireable food choices As related to type 2 diabetes self management.  As evidenced by Hg A1c of 15.6 on 01/13/2020, pt discussion, and diet recall.  Intervention:  Discussion and instruction as listed above. Simplified the carb counting concept for patient.  Also answered questions about how what foods have carbs and types of carbs.  Established goals for additional change.    Increase fruit and vegetable intake   Incorporate vegetables at lunch and dinner   Incorporate more F/V at snacks  Do not skip lunch, include whole fruit at breakfast   Try frozen veggies that come in microwaveable pouches (may be tender than fresh and low prep time)  Try canned fruit NOT in heavy syrup (mandarin oranges, peaches, pears, applesauce) for snacks + protein (cheese, PB)   Decrease sugar-sweetened beverage consumption   Switch from sweet to unsweet tea with sugar substitute   Switch from regular to diet soda  Drink at least 64oz water daily (add crystal light, lemon, or mio as needed)    Increase physical activity (discuss at follow up)  Incrementally reintroduce exercise back into routine   150 minutes per week is recommendation   Try chair yoga or seated exercises on YouTube   Increase fiber intake   Eat at least 3 servings of whole grains a day  Increase fruit and vegetable intake  Decrease sodium intake   Reduce sodium intake to recommended 1542m/day   Read nutrition labels on packaged foods to monitor sodium intake   400-600 mg/meal  <200 mg/snack   Decrease fast food frequency   Avoid  processed meats    Decrease saturated fat intake   Try more plant-based sources of protein  Limit processed meats   Switch to low fat dairy products   Education Materials given:  . NCM Heart healthy, carb consistent nutrition therapy  . Plate Planner with food lists  . Goals/  instructions  Learner/ who was taught:  . Patient   Level of understanding: Marland Kitchen Verbalizes/ demonstrates competency  Demonstrated degree of understanding via:   Teach back Learning barriers: . None  Willingness to learn/ readiness for change: . Acceptance, ready for change  Monitoring and Evaluation:  Dietary intake, exercise, BGs and A1c, and body weight      follow up: December 10 at 11am

## 2020-03-15 ENCOUNTER — Ambulatory Visit: Payer: Medicare Other | Admitting: Podiatry

## 2020-03-15 ENCOUNTER — Encounter: Payer: Self-pay | Admitting: Podiatry

## 2020-03-15 ENCOUNTER — Other Ambulatory Visit: Payer: Self-pay

## 2020-03-15 DIAGNOSIS — E119 Type 2 diabetes mellitus without complications: Secondary | ICD-10-CM

## 2020-03-15 DIAGNOSIS — M79675 Pain in left toe(s): Secondary | ICD-10-CM | POA: Diagnosis not present

## 2020-03-15 DIAGNOSIS — B351 Tinea unguium: Secondary | ICD-10-CM | POA: Insufficient documentation

## 2020-03-15 DIAGNOSIS — M79674 Pain in right toe(s): Secondary | ICD-10-CM | POA: Diagnosis not present

## 2020-03-15 NOTE — Progress Notes (Signed)
This patient returns to my office for at risk foot care.  This patient requires this care by a professional since this patient will be at risk due to having diabetes and coagulation defect.  Patient is taking plavix.  Patient has not been seen for years.  This patient is unable to cut nails himself since the patient cannot reach his nails.These nails are painful walking and wearing shoes.  This patient presents for at risk foot care today.  General Appearance  Alert, conversant and in no acute stress.  Vascular  Dorsalis pedis and posterior tibial  pulses are weakly  palpable  bilaterally.  Capillary return is within normal limits  bilaterally. Temperature is within normal limits  bilaterally.  Neurologic  Senn-Weinstein monofilament wire test within normal limits  bilaterally. Muscle power within normal limits bilaterally.  Nails Thick disfigured discolored nails with subungual debris  from hallux to fifth toes bilaterally. No evidence of bacterial infection or drainage bilaterally.  Orthopedic  No limitations of motion  feet .  No crepitus or effusions noted.  No bony pathology or digital deformities noted.  Skin  normotropic skin with no porokeratosis noted bilaterally.  No signs of infections or ulcers noted.     Onychomycosis  Pain in right toes  Pain in left toes  Consent was obtained for treatment procedures.   Mechanical debridement of nails 1-5  bilaterally performed with a nail nipper.  Filed with dremel without incident.    Return office visit                     Told patient to return for periodic foot care and evaluation due to potential at risk complications.   Gardiner Barefoot DPM

## 2020-03-22 ENCOUNTER — Other Ambulatory Visit: Payer: Self-pay | Admitting: Specialist

## 2020-03-22 DIAGNOSIS — J449 Chronic obstructive pulmonary disease, unspecified: Secondary | ICD-10-CM

## 2020-03-22 DIAGNOSIS — R918 Other nonspecific abnormal finding of lung field: Secondary | ICD-10-CM

## 2020-03-22 DIAGNOSIS — R0600 Dyspnea, unspecified: Secondary | ICD-10-CM

## 2020-03-22 DIAGNOSIS — R0609 Other forms of dyspnea: Secondary | ICD-10-CM

## 2020-04-02 ENCOUNTER — Ambulatory Visit
Admission: RE | Admit: 2020-04-02 | Discharge: 2020-04-02 | Disposition: A | Discharge status: Home / Self Care | Payer: Medicare Other | Source: Ambulatory Visit | Attending: Specialist | Admitting: Specialist

## 2020-04-02 ENCOUNTER — Other Ambulatory Visit: Payer: Self-pay

## 2020-04-02 DIAGNOSIS — R0600 Dyspnea, unspecified: Secondary | ICD-10-CM | POA: Diagnosis present

## 2020-04-02 DIAGNOSIS — R918 Other nonspecific abnormal finding of lung field: Secondary | ICD-10-CM | POA: Diagnosis present

## 2020-04-02 DIAGNOSIS — J449 Chronic obstructive pulmonary disease, unspecified: Secondary | ICD-10-CM | POA: Diagnosis present

## 2020-04-02 DIAGNOSIS — R0609 Other forms of dyspnea: Secondary | ICD-10-CM

## 2020-04-03 ENCOUNTER — Other Ambulatory Visit: Payer: Self-pay | Admitting: Specialist

## 2020-04-03 DIAGNOSIS — R911 Solitary pulmonary nodule: Secondary | ICD-10-CM

## 2020-04-20 ENCOUNTER — Ambulatory Visit: Payer: Medicare Other | Admitting: Dietician

## 2020-04-24 ENCOUNTER — Other Ambulatory Visit: Payer: Self-pay

## 2020-04-24 ENCOUNTER — Encounter
Admission: RE | Admit: 2020-04-24 | Discharge: 2020-04-24 | Disposition: A | Discharge status: Home / Self Care | Payer: Medicare Other | Source: Ambulatory Visit | Attending: Specialist | Admitting: Specialist

## 2020-04-24 DIAGNOSIS — R911 Solitary pulmonary nodule: Secondary | ICD-10-CM | POA: Diagnosis not present

## 2020-04-24 DIAGNOSIS — J439 Emphysema, unspecified: Secondary | ICD-10-CM | POA: Insufficient documentation

## 2020-04-24 DIAGNOSIS — I700 Atherosclerosis of aorta: Secondary | ICD-10-CM | POA: Insufficient documentation

## 2020-04-24 LAB — GLUCOSE, CAPILLARY: Glucose-Capillary: 117 mg/dL — ABNORMAL HIGH (ref 70–99)

## 2020-04-24 MED ORDER — FLUDEOXYGLUCOSE F - 18 (FDG) INJECTION
6.6000 | Freq: Once | INTRAVENOUS | Status: AC | PRN
  Administered 2020-04-24: 7.22 via INTRAVENOUS

## 2020-05-24 ENCOUNTER — Encounter: Payer: Self-pay | Admitting: *Deleted

## 2020-05-24 NOTE — Progress Notes (Signed)
  Oncology Nurse Navigator Documentation  Navigator Location: [facility]Med [facility]c (05/24/20 1100) Referral Date to RadOnc/MedOnc: 05/24/20 (05/24/20 1100) )Navigator Encounter Type: Telephone (05/24/20 1100) Telephone: Outgoing Call (05/24/20 1100) Abnormal Finding Date: 04/02/20 (05/24/20 1100)                   Treatment Phase: Abnormal Scans (05/24/20 1100) Barriers/Navigation Needs: Coordination of Care (05/24/20 1100)   Interventions: Coordination of Care (05/24/20 1100)   Coordination of Care: Appts (05/24/20 1100)       referral received for oncology consultation. Phone call made to patient to schedule for new patient visit within the next few days. Pt did not answer. Message left for return call to schedule new patient visit.            Time Spent with Patient: 30 (05/24/20 1100)

## 2020-05-24 NOTE — Progress Notes (Signed)
Received call back from patient. Pt confirmed appt to see Dr. Tasia Catchings on Fri 1/14 at 9:30am.

## 2020-05-25 ENCOUNTER — Encounter: Payer: Self-pay | Admitting: *Deleted

## 2020-05-25 ENCOUNTER — Inpatient Hospital Stay: Payer: Medicare Other | Attending: Oncology | Admitting: Oncology

## 2020-05-25 ENCOUNTER — Other Ambulatory Visit: Payer: Self-pay

## 2020-05-25 ENCOUNTER — Inpatient Hospital Stay: Payer: Medicare Other

## 2020-05-25 ENCOUNTER — Encounter: Payer: Self-pay | Admitting: Oncology

## 2020-05-25 VITALS — BP 110/67 | HR 88 | Temp 98.0°F | Resp 18 | Ht 60.0 in | Wt 136.1 lb

## 2020-05-25 DIAGNOSIS — R222 Localized swelling, mass and lump, trunk: Secondary | ICD-10-CM

## 2020-05-25 DIAGNOSIS — F17210 Nicotine dependence, cigarettes, uncomplicated: Secondary | ICD-10-CM

## 2020-05-25 DIAGNOSIS — R918 Other nonspecific abnormal finding of lung field: Secondary | ICD-10-CM | POA: Diagnosis not present

## 2020-05-25 DIAGNOSIS — Z833 Family history of diabetes mellitus: Secondary | ICD-10-CM | POA: Insufficient documentation

## 2020-05-25 DIAGNOSIS — I509 Heart failure, unspecified: Secondary | ICD-10-CM

## 2020-05-25 DIAGNOSIS — I2510 Atherosclerotic heart disease of native coronary artery without angina pectoris: Secondary | ICD-10-CM | POA: Diagnosis not present

## 2020-05-25 DIAGNOSIS — J449 Chronic obstructive pulmonary disease, unspecified: Secondary | ICD-10-CM | POA: Diagnosis not present

## 2020-05-25 DIAGNOSIS — Z8249 Family history of ischemic heart disease and other diseases of the circulatory system: Secondary | ICD-10-CM

## 2020-05-25 DIAGNOSIS — Z79899 Other long term (current) drug therapy: Secondary | ICD-10-CM | POA: Diagnosis not present

## 2020-05-25 DIAGNOSIS — I110 Hypertensive heart disease with heart failure: Secondary | ICD-10-CM | POA: Diagnosis not present

## 2020-05-25 DIAGNOSIS — I495 Sick sinus syndrome: Secondary | ICD-10-CM | POA: Diagnosis not present

## 2020-05-25 DIAGNOSIS — R911 Solitary pulmonary nodule: Secondary | ICD-10-CM | POA: Diagnosis not present

## 2020-05-25 DIAGNOSIS — J439 Emphysema, unspecified: Secondary | ICD-10-CM

## 2020-05-25 DIAGNOSIS — J392 Other diseases of pharynx: Secondary | ICD-10-CM | POA: Diagnosis not present

## 2020-05-25 DIAGNOSIS — I700 Atherosclerosis of aorta: Secondary | ICD-10-CM | POA: Insufficient documentation

## 2020-05-25 DIAGNOSIS — I5022 Chronic systolic (congestive) heart failure: Secondary | ICD-10-CM | POA: Diagnosis not present

## 2020-05-25 DIAGNOSIS — E119 Type 2 diabetes mellitus without complications: Secondary | ICD-10-CM

## 2020-05-25 DIAGNOSIS — I252 Old myocardial infarction: Secondary | ICD-10-CM | POA: Diagnosis not present

## 2020-05-25 DIAGNOSIS — I429 Cardiomyopathy, unspecified: Secondary | ICD-10-CM | POA: Insufficient documentation

## 2020-05-25 LAB — COMPREHENSIVE METABOLIC PANEL
ALT: 21 U/L (ref 0–44)
AST: 27 U/L (ref 15–41)
Albumin: 3.6 g/dL (ref 3.5–5.0)
Alkaline Phosphatase: 84 U/L (ref 38–126)
Anion gap: 7 (ref 5–15)
BUN: 13 mg/dL (ref 8–23)
CO2: 28 mmol/L (ref 22–32)
Calcium: 8.7 mg/dL — ABNORMAL LOW (ref 8.9–10.3)
Chloride: 95 mmol/L — ABNORMAL LOW (ref 98–111)
Creatinine, Ser: 0.61 mg/dL (ref 0.61–1.24)
GFR, Estimated: >60 mL/min (ref >60)
Glucose, Bld: 158 mg/dL — ABNORMAL HIGH (ref 70–99)
Potassium: 3.6 mmol/L (ref 3.5–5.1)
Sodium: 130 mmol/L — ABNORMAL LOW (ref 135–145)
Total Bilirubin: 0.4 mg/dL (ref 0.3–1.2)
Total Protein: 6.9 g/dL (ref 6.5–8.1)

## 2020-05-25 LAB — CBC WITH DIFFERENTIAL/PLATELET
Abs Immature Granulocytes: 0.02 10*3/uL (ref 0.00–0.07)
Basophils Absolute: 0 10*3/uL (ref 0.0–0.1)
Basophils Relative: 0 %
Eosinophils Absolute: 0.1 10*3/uL (ref 0.0–0.5)
Eosinophils Relative: 2 %
HCT: 35.9 % — ABNORMAL LOW (ref 39.0–52.0)
Hemoglobin: 11.9 g/dL — ABNORMAL LOW (ref 13.0–17.0)
Immature Granulocytes: 0 %
Lymphocytes Relative: 22 %
Lymphs Abs: 1.2 10*3/uL (ref 0.7–4.0)
MCH: 29.9 pg (ref 26.0–34.0)
MCHC: 33.1 g/dL (ref 30.0–36.0)
MCV: 90.2 fL (ref 80.0–100.0)
Monocytes Absolute: 0.5 10*3/uL (ref 0.1–1.0)
Monocytes Relative: 9 %
Neutro Abs: 3.7 10*3/uL (ref 1.7–7.7)
Neutrophils Relative %: 67 %
Platelets: 209 10*3/uL (ref 150–400)
RBC: 3.98 MIL/uL — ABNORMAL LOW (ref 4.22–5.81)
RDW: 15.6 % — ABNORMAL HIGH (ref 11.5–15.5)
WBC: 5.6 10*3/uL (ref 4.0–10.5)
nRBC: 0 % (ref 0.0–0.2)

## 2020-05-25 LAB — LACTATE DEHYDROGENASE: LDH: 149 U/L (ref 98–192)

## 2020-05-25 NOTE — Progress Notes (Signed)
  Oncology Nurse Navigator Documentation  Navigator Location: [facility] (05/25/20 1400)   )Navigator Encounter Type: Clinic/MDC (05/25/20 1400)               Multidisiplinary Clinic Date: 05/25/20 (05/25/20 1400) Multidisiplinary Clinic Type: Thoracic (05/25/20 1400)   Patient Visit Type: MedOnc (05/25/20 1400)   Barriers/Navigation Needs: Coordination of Care;Morbidities/Frailty (05/25/20 1400)   Interventions: Coordination of Care;Referrals (05/25/20 1400) Referrals: Pulmonary (05/25/20 1400) Coordination of Care: Appts;Broncoscopy (05/25/20 1400)        Acuity: Level 2-Minimal Needs (1-2 Barriers Identified) (05/25/20 1400)    met with patient during initial consult with Dr. Tasia Catchings. All questions answered during visit. Reviewed with patient referrals placed and informed to expect call with those appts. Informed that once he is scheduled for bronchoscopy then will get him scheduled with follow up with Dr. Tasia Catchings to discuss results and treatment options. Contact info given to patient and instructed to call with any questions or needs. Pt verbalized understanding.      Time Spent with Patient: 60 (05/25/20 1400)

## 2020-05-25 NOTE — Progress Notes (Signed)
Pt here to establish care.

## 2020-05-25 NOTE — Progress Notes (Signed)
Hematology/Oncology Consult note [facility] [phone] [phone]   Patient Care Team: Idelle Crouch, MD as PCP - General (Internal Medicine) Yolonda Kida, MD as Consulting Physician (Cardiology)  REFERRING PROVIDER: Idelle Crouch, MD  CHIEF COMPLAINTS/REASON FOR VISIT:  Evaluation of lung nodule.  HISTORY OF PRESENTING ILLNESS:   John Frank is a  65 y.o.  male with PMH listed below was seen in consultation at the request of  Idelle Crouch, MD  for evaluation of lung nodule 11/18/2018 bilateral pulmonary nodules including a groundglass attenuating nodule in the right upper lobe measuring 1 cm and a solid nodule in the right lower lobe measuring 1.1 cm in diameter. 04/02/2020, patient had chest without contrast, 1.9 x 1.8 cm spiculated retrohilar right lower lobe pulmonary nodule has increased in size from 10 x 9 mm previously 04/24/2020, PET scan showed 1.9 cm right lower lobe pulmonary nodule is hypermetabolic consistent with primary bronchogenic neoplasm.  No hypermetabolic lymphadenopathy in the right hilum or mediastinum.  There is asymmetric hypermetabolic FDG accumulation in the mucosa of the posterior left nasopharynx without discrete mass lesion or soft tissue asymmetry on CT imaging.  No evidence of of suspicious hypermetabolic disease in the abdomen or pelvis.  Tiny bilateral nodules show no FDG accumulation.  Patient was referred to cancer center lung clinic for evaluation and further management. He lives at home by himself.  Close family member is his sister. Patient follows up with cardiology [facility] for congestive heart failure, sick sinus syndrome with permanent pacemaker, cardiomyopathy AICD in place.    Current everyday smoker, 40 pack year history.  Review of Systems  Constitutional: Negative for appetite change, chills, fatigue, fever and unexpected weight change.  HENT:   Negative for hearing loss and  voice change.   Eyes: Negative for eye problems and icterus.  Respiratory: Positive for cough. Negative for chest tightness and shortness of breath.   Cardiovascular: Negative for chest pain and leg swelling.  Gastrointestinal: Negative for abdominal distention and abdominal pain.  Endocrine: Negative for hot flashes.  Genitourinary: Negative for difficulty urinating, dysuria and frequency.   Musculoskeletal: Negative for arthralgias.  Skin: Negative for itching and rash.  Neurological: Negative for light-headedness and numbness.  Hematological: Negative for adenopathy. Does not bruise/bleed easily.  Psychiatric/Behavioral: Negative for confusion.    MEDICAL HISTORY:  Past Medical History:  Diagnosis Date  . CAD (coronary artery disease)    s/p stents in 2017  . Chronic systolic CHF (congestive heart failure) ([place])   . COPD (chronic obstructive pulmonary disease) ([place])   . Diabetes ([place])   . GERD (gastroesophageal reflux disease)   . Heart attack ([place])   . Hyperlipidemia   . Hypertension   . Presence of permanent cardiac pacemaker    and defibrillator  . Shortness of breath dyspnea   . Sleep apnea     SURGICAL HISTORY: Past Surgical History:  Procedure Laterality Date  . CARDIAC PACEMAKER PLACEMENT    . cardiac stents    . CARDIAC SURGERY     defibrillator and pacemaker  . CORONARY ANGIOPLASTY    . ESOPHAGOGASTRODUODENOSCOPY N/A 09/19/2014   Procedure: ESOPHAGOGASTRODUODENOSCOPY (EGD);  Surgeon: Lucilla Lame, MD;  Location[facility] ENDOSCOPY;  Service: Endoscopy;  Laterality: N/A;  . ESOPHAGOGASTRODUODENOSCOPY (EGD) WITH PROPOFOL N/A 09/28/2017   Procedure: ESOPHAGOGASTRODUODENOSCOPY (EGD) WITH PROPOFOL;  Surgeon: Virgel Manifold, MD;  Location: [facility] ENDOSCOPY;  Service: Endoscopy;  Laterality: N/A;  . ESOPHAGOGASTRODUODENOSCOPY (EGD) WITH PROPOFOL N/A 04/19/2018  Procedure: ESOPHAGOGASTRODUODENOSCOPY (EGD) WITH PROPOFOL with Gastric Mapping;  Surgeon: [name], MD;   Location: [facility] ENDOSCOPY;  Service: Gastroenterology;  Laterality: N/A;  . ESOPHAGOGASTRODUODENOSCOPY (EGD) WITH PROPOFOL N/A 12/13/2018   Procedure: ESOPHAGOGASTRODUODENOSCOPY (EGD) WITH PROPOFOL;  Surgeon: [name], MD;  Location: [facility] ENDOSCOPY;  Service: Gastroenterology;  Laterality: N/A;  . ESOPHAGOGASTRODUODENOSCOPY (EGD) WITH PROPOFOL N/A 03/31/2019   Procedure: ESOPHAGOGASTRODUODENOSCOPY (EGD) WITH PROPOFOL;  Surgeon: Lin Landsman, MD;  Location: [facility] ENDOSCOPY;  Service: Gastroenterology;  Laterality: N/A;  . IMPLANTABLE CARDIOVERTER DEFIBRILLATOR (ICD) GENERATOR CHANGE Left 02/21/2020   Procedure: ICD GENERATOR CHANGE;  Surgeon: Isaias Cowman, MD;  Location: [facility] ORS;  Service: Cardiovascular;  Laterality: Left;    SOCIAL HISTORY: Social History   Socioeconomic History  . Marital status: Single    Spouse name: Not on file  . Number of children: Not on file  . Years of education: Not on file  . Highest education level: Not on file  Occupational History  . Occupation: disabled  Tobacco Use  . Smoking status: Current Every Day Smoker    Packs/day: 0.25    Years: 40.00    Pack years: 10.00    Types: Cigarettes  . Smokeless tobacco: Never Used  Vaping Use  . Vaping Use: Never used  Substance and Sexual Activity  . Alcohol use: No  . Drug use: Never  . Sexual activity: Yes  Other Topics Concern  . Not on file  Social History Narrative   Independent at baseline.  Lives at home with his family   Social Determinants of Health   Financial Resource Strain: Not on file  Food Insecurity: Not on file  Transportation Needs: Not on file  Physical Activity: Not on file  Stress: Not on file  Social Connections: Not on file  Intimate Partner Violence: Not on file    FAMILY HISTORY: Family History  Problem Relation Age of Onset  . Diabetes Mother   . Heart attack Father 44    ALLERGIES:  has No Known Allergies.  MEDICATIONS:  Current Outpatient  Medications  Medication Sig Dispense Refill  . albuterol (PROVENTIL HFA;VENTOLIN HFA) 108 (90 Base) MCG/ACT inhaler Inhale 2 puffs into the lungs every 6 (six) hours as needed for wheezing or shortness of breath. 1 Inhaler 2  . aspirin 81 MG EC tablet Take by mouth.    Marland Kitchen atorvastatin (LIPITOR) 40 MG tablet TAKE 1 TABLET (40 MG TOTAL) BY MOUTH DAILY. (Patient taking differently: Take 40 mg by mouth daily.) 90 tablet 1  . Blood Glucose Monitoring Suppl (ACCU-CHEK GUIDE ME) w/Device KIT     . buPROPion (WELLBUTRIN) 75 MG tablet Take 75 mg by mouth daily.    . clopidogrel (PLAVIX) 75 MG tablet TAKE 1 TABLET (75 MG TOTAL) BY MOUTH DAILY. 90 tablet 3  . ENTRESTO 49-51 MG TAKE 1 TABLET BY MOUTH TWICE A DAY (Patient taking differently: Take 1 tablet by mouth 2 (two) times daily.) 60 tablet 5  . glimepiride (AMARYL) 4 MG tablet Take by mouth.    Marland Kitchen ipratropium-albuterol (DUONEB) 0.5-2.5 (3) MG/3ML SOLN Take 3 mLs by nebulization every 6 (six) hours as needed (wheezing; shortness of breath).    . JARDIANCE 25 MG TABS tablet Take 25 mg by mouth daily.    Marland Kitchen levocetirizine (XYZAL) 5 MG tablet Take 5 mg by mouth every evening.     . metFORMIN (GLUCOPHAGE) 1000 MG tablet TAKE 1 TABLET (1,000 MG TOTAL) BY MOUTH 2 (TWO) TIMES DAILY WITH A MEAL. (Patient taking differently:  Take 1,000 mg by mouth in the morning and at bedtime.) 180 tablet 1  . metoprolol succinate (TOPROL-XL) 100 MG 24 hr tablet Take 1 tablet (100 mg total) by mouth daily. 90 tablet 3  . ondansetron (ZOFRAN ODT) 4 MG disintegrating tablet Take 1 tablet (4 mg total) by mouth every 8 (eight) hours as needed. 20 tablet 0  . ONE TOUCH ULTRA TEST test strip USE TO TEST BLOOD SUGAR ONCE DAILY 100 each 3  . pantoprazole (PROTONIX) 40 MG tablet Take 1 tablet (40 mg total) by mouth 2 (two) times daily before a meal. 180 tablet 1  . spironolactone (ALDACTONE) 25 MG tablet TAKE 1/2 TABLET BY MOUTH EVERY DAY (Patient taking differently: Take 12.5 mg by mouth  daily.) 45 tablet 3  . sucralfate (CARAFATE) 1 g tablet Take 1 g by mouth 4 (four) times daily -  with meals and at bedtime.     . TRULICITY 1.5 QI/3.4VQ SOPN SMARTSIG:0.5 Milliliter(s) SUB-Q Once a Week    . cephALEXin (KEFLEX) 250 MG capsule Take by mouth. (Patient not taking: Reported on 05/25/2020)    . nitroGLYCERIN (NITROSTAT) 0.4 MG SL tablet Place 0.4 mg under the tongue every 5 (five) minutes x 3 doses as needed for chest pain. (Patient not taking: Reported on 05/25/2020)     No current facility-administered medications for this visit.     PHYSICAL EXAMINATION: ECOG PERFORMANCE STATUS: 1 - Symptomatic but completely ambulatory Vitals:   05/25/20 0914  BP: 110/67  Pulse: 88  Resp: 18  Temp: 98 F (36.7 C)  SpO2: 98%   Filed Weights   05/25/20 0914  Weight: 136 lb 1.6 oz (61.7 kg)    Physical Exam Constitutional:      General: He is not in acute distress. HENT:     Head: Normocephalic and atraumatic.  Eyes:     General: No scleral icterus. Cardiovascular:     Rate and Rhythm: Normal rate and regular rhythm.     Heart sounds: Normal heart sounds.  Pulmonary:     Effort: Pulmonary effort is normal. No respiratory distress.     Breath sounds: Wheezing present.     Comments: Wheezing bilaterally.  Abdominal:     General: Bowel sounds are normal. There is no distension.     Palpations: Abdomen is soft.  Musculoskeletal:        General: No deformity. Normal range of motion.     Cervical back: Normal range of motion and neck supple.  Skin:    General: Skin is warm and dry.     Findings: No erythema or rash.  Neurological:     Mental Status: He is alert and oriented to person, place, and time. Mental status is at baseline.     Cranial Nerves: No cranial nerve deficit.     Coordination: Coordination normal.  Psychiatric:        Mood and Affect: Mood normal.     LABORATORY DATA:  I have reviewed the data as listed Lab Results  Component Value Date   WBC 8.7  02/21/2020   HGB 15.3 02/21/2020   HCT 44.1 02/21/2020   MCV 90.0 02/21/2020   PLT 254 02/21/2020   Recent Labs    09/06/19 2152 01/04/20 1124 01/15/20 1523 02/21/20 1126  NA 139 132* 135 130*  K 4.3 4.8 3.8 4.7  CL 100 95* 100 93*  CO2 '28 27 26 25  ' GLUCOSE 142* 496* 397* 476*  BUN '18 11 9 14  ' CREATININE  1.04 0.79 0.62 0.85  CALCIUM 9.5 9.3 9.1 9.9  GFRNONAA >60 >60 >60 >60  GFRAA >60 >60 >60  --   PROT 8.0  --   --   --   ALBUMIN 4.5  --   --   --   AST 28  --   --   --   ALT 27  --   --   --   ALKPHOS 139*  --   --   --   BILITOT 1.4*  --   --   --    Iron/TIBC/Ferritin/ %Sat    Component Value Date/Time   IRON 30 (L) 08/03/2017 1442   TIBC 398 08/03/2017 1442   FERRITIN 34 08/03/2017 1442   IRONPCTSAT 8 (L) 08/03/2017 1442      RADIOGRAPHIC STUDIES: I have personally reviewed the radiological images as listed and agreed with the findings in the report. CT CHEST WO CONTRAST  Result Date: 04/02/2020 CLINICAL DATA:  Pulmonary nodules.  Skip EXAM: CT CHEST WITHOUT CONTRAST TECHNIQUE: Multidetector CT imaging of the chest was performed following the standard protocol without IV contrast. COMPARISON:  Abdomen pelvis CT 01/25/2019.  Chest CT 11/18/2018. FINDINGS: Cardiovascular: The heart size is normal. No substantial pericardial effusion. Coronary artery calcification is evident. Atherosclerotic calcification is noted in the wall of the thoracic aorta. Left permanent pacemaker. Mediastinum/Nodes: No mediastinal lymphadenopathy. Upper normal 9 mm short axis subcarinal node is stable. No evidence for gross hilar lymphadenopathy although assessment is limited by the lack of intravenous contrast on today's study. The esophagus has normal imaging features. There is no axillary lymphadenopathy. Lungs/Pleura: Centrilobular emphsyema noted. Biapical pleuroparenchymal scarring evident. 1.9 x 1.8 cm spiculated retro hilar right lower lobe pulmonary nodule (image 80/series 4) has  increased in size from 10 x 9 mm previously (remeasured). This is highly suspicious for primary bronchogenic neoplasm. Lobular cranial contour of this lesion is well demonstrated on coronal image 78/5. 4 mm peripheral right upper lobe nodule on 56/4 is stable. 4 mm right perifissural nodule on 76/4 is unchanged. Calcified granulomata in the posterior right lower lobe on 98/4 are similar to prior. Tiny subpleural nodules posterior left upper lobe on image 33 and image 39 of series 4 are stable. Branching left upper lobe pulmonary nodule on 42/4 is compatible with impacted airway, stable. Additional scattered parenchymal and subpleural tiny nodules are noted. No pleural effusion. Upper Abdomen: Unremarkable. Musculoskeletal: No worrisome lytic or sclerotic osseous abnormality. IMPRESSION: 1. 1.9 x 1.8 cm spiculated retro hilar right lower lobe pulmonary nodule has increased in size from 10 x 9 mm previously. This is highly suspicious for primary bronchogenic neoplasm. PET-CT recommended to further evaluate. 2. Additional tiny bilateral pulmonary nodules are stable in the interval since chest CT 11/18/2018. 3. Aortic Atherosclerosis (ICD10-I70.0) and Emphysema (ICD10-J43.9). These results will be called to the ordering clinician or representative by the Radiologist Assistant, and communication documented in the PACS or [employer]. Electronically Signed   By: Misty Stanley M.D.   On: 04/02/2020 15:04   NM PET Image Initial (PI) Skull Base To Thigh  Result Date: 04/24/2020 CLINICAL DATA:  Initial treatment strategy for pulmonary nodule. EXAM: NUCLEAR MEDICINE PET SKULL BASE TO THIGH TECHNIQUE: 7.2 mCi F-18 FDG was injected intravenously. Full-ring PET imaging was performed from the skull base to thigh after the radiotracer. CT data was obtained and used for attenuation correction and anatomic localization. Fasting blood glucose: 117 mg/dl COMPARISON:  Chest CT 04/02/2020 FINDINGS: Mediastinal blood pool  activity:  SUV max 1.6 Liver activity: SUV max NA NECK: Asymmetric uptake identified posterior left nasopharynx in the region of the fossa of [place]er. SUV max = 3.1. No soft tissue asymmetry or mass lesion evident on noncontrast CT imaging today. Incidental CT findings: none CHEST: Spiculated 1.9 cm retro hilar right lower lobe nodule of concern on previous diagnostic chest CT is hypermetabolic with SUV max = 5.1. No evidence for hypermetabolic lymphadenopathy in the right hilum or mediastinum. No additional sites of unexpected or suspicious hypermetabolism in the thorax. Incidental CT findings: Centrilobular emphsyema noted. Scattered tiny bilateral pulmonary nodules identified on the previous CT scan appear stable today. No pleural effusion. ABDOMEN/PELVIS: No abnormal hypermetabolic activity within the liver, pancreas, adrenal glands, or spleen. No hypermetabolic lymph nodes in the abdomen or pelvis. Incidental CT findings: There is abdominal aortic atherosclerosis without aneurysm. Small bilateral groin hernias contain only fat. SKELETON: No focal hypermetabolic activity to suggest skeletal metastasis. Incidental CT findings: No worrisome lytic or sclerotic osseous abnormality. IMPRESSION: 1. 1.9 cm right lower lobe pulmonary nodule is hypermetabolic consistent with primary bronchogenic neoplasm. No hypermetabolic lymphadenopathy in the right hilum or mediastinum. 2. Asymmetric hypermetabolic FDG accumulation in the mucosa of the posterior left nasopharynx without discrete mass lesion or soft tissue asymmetry on CT imaging. Direct visualization recommended to exclude mucosal neoplasm. 3. No evidence for unexpected or suspicious hypermetabolic disease in the abdomen/pelvis. 4. Tiny bilateral pulmonary nodules show no FDG accumulation but are below accepted size threshold for reliable resolution on PET imaging. 5.  Aortic Atherosclerois (ICD10-170.0) Emphysema. (TUU82-C00.9) Electronically Signed   By: Misty Stanley M.D.   On: 04/24/2020 12:07      ASSESSMENT & PLAN:  1. Lung mass   2. Disorder of nasopharynx   3. Chronic obstructive pulmonary disease, unspecified COPD type ([place])   4. Chronic congestive heart failure, unspecified heart failure type ([place])    #Lung mass CT images and PET scan images were independently reviewed by me and discussed with patient. Clinically he has cT1 lung lesion with no significant thoracic lymph node involvement.  No distant metastasis. There is an area in the nasopharynx with increased activity.  I will refer patient to establish care with ENT for direct visualization. Patient will need to establish tissue diagnosis. Patient already follows up with Dr. Gust Brooms clinic.  Recommend patient to follow-up with pulmonology clinic for biopsy.  Bronchoscopy.  I discussed with the patient about possible cancer diagnosis.  He likely is not a surgical candidate given his borderline lung function, medical comorbidities.  He may benefit from SBRT radiation. Will further discuss with him once the tissue diagnosis is back.  Orders Placed This Encounter  Procedures  . CBC with Differential/Platelet    Standing Status:   Future    Number of Occurrences:   1    Standing Expiration Date:   05/25/2021  . Comprehensive metabolic panel    Standing Status:   Future    Number of Occurrences:   1    Standing Expiration Date:   05/25/2021  . Lactate dehydrogenase    Standing Status:   Future    Number of Occurrences:   1    Standing Expiration Date:   05/25/2021  . Ambulatory referral to ENT    Referral Priority:   Routine    Referral Type:   Consultation    Referral Reason:   Specialty Services Required    Referred to Provider:   Carloyn Manner, MD    Requested Specialty:  Otolaryngology    Number of Visits Requested:   1    All questions were answered. The patient knows to call the clinic with any problems questions or concerns.  cc Idelle Crouch, MD     Return of visit: To be determined Thank you for this kind referral and the opportunity to participate in the care of this patient. A copy of today's note is routed to referring provider    Earlie Server, MD, PhD Hematology Oncology [facility] Pager- [phone] 05/25/2020

## 2020-05-30 ENCOUNTER — Other Ambulatory Visit: Payer: Self-pay | Admitting: Pulmonary Disease

## 2020-05-30 ENCOUNTER — Encounter: Payer: Self-pay | Admitting: *Deleted

## 2020-05-30 DIAGNOSIS — R918 Other nonspecific abnormal finding of lung field: Secondary | ICD-10-CM

## 2020-05-30 NOTE — Progress Notes (Signed)
  Oncology Nurse Navigator Documentation  Navigator Location: [facility] (05/30/20 0900)   )Navigator Encounter Type: Telephone (05/30/20 0900) Telephone: Lahoma Crocker Call;Patient Update (05/30/20 0900)                       Barriers/Navigation Needs: Coordination of Care (05/30/20 0900)   Interventions: Coordination of Care (05/30/20 0900)           follow up phone call made to [facility] pulmonary to confirm that their office received pt's referral to Dr. Lanney Gins for bronchoscopy. Spoke with Zigmund Daniel who confirmed referral has been received. Attempted to contact pt to update that [facility] pulmonary is working on getting him scheduled. Pt did not answer and unable to leave message due to full mailbox. Will try again to update pt later.            Time Spent with Patient: 30 (05/30/20 0900)

## 2020-06-04 ENCOUNTER — Ambulatory Visit: Admission: RE | Admit: 2020-06-04 | Payer: Medicare Other | Source: Ambulatory Visit

## 2020-06-05 ENCOUNTER — Encounter
Admission: RE | Admit: 2020-06-05 | Discharge: 2020-06-05 | Disposition: A | Discharge status: Home / Self Care | Payer: Medicare Other | Source: Ambulatory Visit | Attending: Pulmonary Disease | Admitting: Pulmonary Disease

## 2020-06-05 NOTE — Pre-Procedure Instructions (Signed)
Chelsea at Dr Teodoro Kil office notified of multiple unsuccessful attempts to get in touch with this pt today and also unable to leave message. States she will attempt to get in touch with him and get him to call [facility] testing for his upcoming procedure 06/08/2020. Copy of instruction printed to give to pt if they come for covid testing appt 06-06-2020. Pt needs labs we will also try to get them if pt comes for covid testing.

## 2020-06-05 NOTE — Patient Instructions (Addendum)
Your procedure is scheduled on: Friday June 08, 2020  Report to the Registration Desk on the 1st floor of the [facility]. To find out your arrival time, please call [phone] between 1PM - 3PM on: June 07, 2020 THURSDAY  REMEMBER: Instructions that are not followed completely may result in serious medical risk, up to and including death; or upon the discretion of your surgeon and anesthesiologist your surgery may need to be rescheduled.  Do not eat food OR DRINK after midnight the night before surgery.  No gum chewing, lozengers or hard candies.  IF USUALLY TAKEN IN THE MORNING -TAKE THESE MEDICATIONS THE MORNING OF SURGERY WITH A SIP OF WATER: BUPROPRION ATORVASTATIN ALSO TAKE PANTOPRAZOLE (take one the night before and one on the morning of surgery - helps to prevent nausea after surgery.)  NO ENTRESTO  THE DAY OF SURGERY NO DIABETES MEDICATION THE DAY OF SURGERY  Use inhalers on the day of surgery   Stop Metformin  2 days prior to surgery. LAST DOSE OF METFORMIN 06/05/2020  Follow recommendations from Cardiologist, Pulmonologist or PCP regarding stopping Aspirin, Coumadin, Plavix, Eliquis, Pradaxa, or Pletal. LAST DOSE OF ASPIRIN AND PLAVIX 06/02/2020 PER OFFICE INSTRUCTIONS  One week prior to surgery: Stop Anti-inflammatories (NSAIDS) such as Advil, Aleve, Ibuprofen, Motrin, Naproxen, Naprosyn and Aspirin based products such as Excedrin, Goodys Powder, BC Powder. Stop ANY OVER THE COUNTER supplements until after surgery. (However, you may continue taking Vitamin D, Vitamin B, and multivitamin up until the day before surgery.)  No Alcohol for 24 hours before or after surgery.  No Smoking including e-cigarettes for 24 hours prior to surgery.  No chewable tobacco products for at least 6 hours prior to surgery.  No nicotine patches on the day of surgery.  Do not use any "recreational" drugs for at least a week prior to your surgery.  Please be advised that the  combination of cocaine and anesthesia may have negative outcomes, up to and including death. If you test positive for cocaine, your surgery will be cancelled.  On the morning of surgery brush your teeth with toothpaste and water, you may rinse your mouth with mouthwash if you wish. Do not swallow any toothpaste or mouthwash.  Do not wear jewelry, make-up, hairpins, clips or nail polish.  Do not wear lotions, powders, or perfumes.   Do not shave body from the neck down 48 hours prior to surgery just in case you cut yourself which could leave a site for infection.  Also, freshly shaved skin may become irritated if using the CHG soap.  Contact lenses, hearing aids and dentures may not be worn into surgery.  Do not bring valuables to the hospital. [facility] is not responsible for any missing/lost belongings or valuables.   SHOWER DAY OF SURGERY  ID YOU USE A CPAP - Bring your C-PAP to the hospital with you in case you may have to spend the night.   Notify your doctor if there is any change in your medical condition (cold, fever, infection).  Wear comfortable clothing (specific to your surgery type) to the hospital.  Plan for stool softeners for home use; pain medications have a tendency to cause constipation. You can also help prevent constipation by eating foods high in fiber such as fruits and vegetables and drinking plenty of fluids as your diet allows.  After surgery, you can help prevent lung complications by doing breathing exercises.  Take deep breaths and cough every 1-2 hours. Your doctor may order  a device called an Incentive Spirometer to help you take deep breaths. When coughing or sneezing, hold a pillow firmly against your incision with both hands. This is called "splinting." Doing this helps protect your incision. It also decreases belly discomfort.  If you are being discharged the day of surgery, you will not be allowed to drive home. You will need a responsible adult  (18 years or older) to drive you home and stay with you that night.   If you are taking public transportation, you will need to have a responsible adult (18 years or older) with you. Please confirm with your physician that it is acceptable to use public transportation.   Please call the [place] Dept. at [phone] if you have any questions about these instructions.  Visitation Policy:  Patients undergoing a surgery or procedure may have one family member or support person with them as long as that person is not COVID-19 positive or experiencing its symptoms.  That person may remain in the waiting area during the procedure.  Inpatient Visitation:    Visiting hours are 7 a.m. to 8 p.m. Patients will be allowed one visitor. The visitor may change daily. The visitor must pass COVID-19 screenings, use hand sanitizer when entering and exiting the patient's room and wear a mask at all times, including in the patient's room. Patients must also wear a mask when staff or their visitor are in the room. Masking is required regardless of vaccination status. Systemwide, no visitors 17 or younger.

## 2020-06-06 ENCOUNTER — Inpatient Hospital Stay
Admission: RE | Admit: 2020-06-06 | Discharge: 2020-06-06 | Disposition: A | Discharge status: Home / Self Care | Payer: Medicare Other | Source: Ambulatory Visit | Attending: Pulmonary Disease | Admitting: Pulmonary Disease

## 2020-06-06 ENCOUNTER — Other Ambulatory Visit: Admission: RE | Admit: 2020-06-06 | Payer: Medicare Other | Source: Ambulatory Visit

## 2020-06-06 NOTE — Pre-Procedure Instructions (Signed)
Called pt to attempt to reach him to do Anesthesia interview and to let pt know he did not come for his covid test today. Pt answered and said that he is too sick to come for his covid test-he has been having diarrhea and vomiting bile since 1-24. When asked if he is running a fever he is unsure of that. During phone call pt had to put me on hold to go vomit. He states he feels like he is too sick to have surgery. I gave pt Dr Teodoro Kil # and told him he needed to call their office and inform them of this. He verbalized he would. I called Scientist, clinical (histocompatibility and immunogenetics) as well at Dr Francena Hanly office and left her a message about pt being sick and that his procedure Friday may need to be rescheduled and that his phone call was not completed. Awaiting call back from [facility]

## 2020-06-07 NOTE — Pre-Procedure Instructions (Addendum)
CHELSEA RN CALLED BACK AND STATED THAT PT CALLED THEIR OFFICE AND INFORMED THEM THAT HE WAS SICK AND WANTED TO PUT THE SURGERY OFF UNTIL HE GETS BETTER. CHELSEA SAID HE IS CANCELLED AND WILL RESCHEDULE ONCE HE GETS BETTER

## 2020-06-08 ENCOUNTER — Ambulatory Visit: Payer: Medicare Other

## 2020-06-08 ENCOUNTER — Ambulatory Visit: Admission: RE | Admit: 2020-06-08 | Payer: Medicare Other | Source: Ambulatory Visit

## 2020-06-13 ENCOUNTER — Other Ambulatory Visit: Payer: Self-pay | Admitting: Pulmonary Disease

## 2020-06-13 ENCOUNTER — Other Ambulatory Visit: Payer: Self-pay

## 2020-06-13 ENCOUNTER — Inpatient Hospital Stay: Payer: Medicare Other

## 2020-06-13 ENCOUNTER — Other Ambulatory Visit (HOSPITAL_COMMUNITY): Payer: Self-pay | Admitting: Pulmonary Disease

## 2020-06-13 DIAGNOSIS — R918 Other nonspecific abnormal finding of lung field: Secondary | ICD-10-CM

## 2020-06-15 ENCOUNTER — Other Ambulatory Visit: Payer: Medicare Other

## 2020-06-18 ENCOUNTER — Encounter (INDEPENDENT_AMBULATORY_CARE_PROVIDER_SITE_OTHER): Payer: Self-pay

## 2020-06-18 ENCOUNTER — Institutional Professional Consult (permissible substitution): Payer: Medicare Other | Admitting: Radiation Oncology

## 2020-06-18 ENCOUNTER — Other Ambulatory Visit: Payer: Self-pay

## 2020-06-18 ENCOUNTER — Encounter: Payer: Self-pay | Admitting: Urgent Care

## 2020-06-18 ENCOUNTER — Encounter
Admission: RE | Admit: 2020-06-18 | Discharge: 2020-06-18 | Disposition: A | Discharge status: Home / Self Care | Payer: Medicare Other | Source: Ambulatory Visit | Attending: Pulmonary Disease | Admitting: Pulmonary Disease

## 2020-06-18 ENCOUNTER — Ambulatory Visit (INDEPENDENT_AMBULATORY_CARE_PROVIDER_SITE_OTHER): Payer: Medicare Other | Admitting: Podiatry

## 2020-06-18 ENCOUNTER — Encounter: Payer: Self-pay | Admitting: Podiatry

## 2020-06-18 ENCOUNTER — Ambulatory Visit: Payer: Medicare Other | Admitting: Oncology

## 2020-06-18 DIAGNOSIS — M79674 Pain in right toe(s): Secondary | ICD-10-CM | POA: Diagnosis not present

## 2020-06-18 DIAGNOSIS — E119 Type 2 diabetes mellitus without complications: Secondary | ICD-10-CM | POA: Diagnosis not present

## 2020-06-18 DIAGNOSIS — B351 Tinea unguium: Secondary | ICD-10-CM

## 2020-06-18 DIAGNOSIS — M79675 Pain in left toe(s): Secondary | ICD-10-CM

## 2020-06-18 NOTE — Progress Notes (Signed)
[facility] Perioperative Services  Pre-Admission/Anesthesia Testing Clinical Review  Date: 06/21/20  Patient Demographics:  Name: John Frank DOB:   02/22/1956 MRN:   [id number]  Planned Surgical Procedure(s):    Case: [id number] Date/Time: 06/22/20 1200   Procedures:      VIDEO BRONCHOSCOPY WITH ENDOBRONCHIAL NAVIGATION (N/A )     VIDEO BRONCHOSCOPY WITH ENDOBRONCHIAL ULTRASOUND (N/A )   Anesthesia type: General   Pre-op diagnosis: Lung Mass R91.8   Location: [facility] PROCEDURE RM 02 / [facility] ORS FOR ANESTHESIA GROUP   Surgeons: Ottie Glazier, MD    NOTE: Available PAT nursing documentation and vital signs have been reviewed. Clinical nursing staff has updated patient's PMH/PSHx, current medication list, and drug allergies/intolerances to ensure comprehensive history available to assist in medical decision making as it pertains to the aforementioned surgical procedure and anticipated anesthetic course.   Clinical Discussion:  John Frank is a 65 y.o. male who is submitted for pre-surgical anesthesia review and clearance prior to him undergoing the above procedure. Patient is a Current Smoker (10 pack years). Pertinent PMH includes: angina at rest, CAD, MI (s/p PCI with stent to RCA), paroxysmal ventricular tachycardia (has AICD), aortic atherosclerosis, ischemic cardiomyopathy, HFrEF, HTN, HLD, T2DM, HLD, COPD, RLL lung mass, hepatic cirrhosis, OSAH (requires nocturnal PAP therapy), GERD (on daily PPI therapy).  Patient is followed by cardiology Clayborn Bigness, MD). He was last seen in the cardiology clinic on 06/07/2020; notes reviewed.  At the time of his clinic visit, patient reported that he felt "reasonably well".  He denied any worsening shortness of breath or chest pain. Of note, patient was recently found to have a RLL lung mass on CT scan.  Patient is managed by pulmonology and is scheduled for an upcoming ENB/EBUS with Dr. Ottie Glazier.  Patient with recent  unexplained weight loss felt to be potentially related to primary bronchogenic carcinoma. Patient with a PMH (+) for CAD.  He is status post a remote PCI with stent placement to his RCA; notes unavailable for review.  Patient does have an AICD in place.  Patient last underwent noninvasive cardiovascular testing and 02/2020.  TTE at that time revealed moderate to severe left ventricular systolic dysfunction with an estimated EF of 25 to 30%.  Myocardial perfusion imaging study revealed severe left ventricular enlargement, severely depressed left ventricular systolic function (LVEF 64-40%), and a large inferior apical scar with no evidence of redistribution (see full interpretation of cardiovascular testing below).  Patient on GDMT for his HTN, HFrEF, and HLD diagnoses.  Blood pressure well controlled at 122/76 on currently prescribed a MRI, diuretic, and beta-blocker therapies.  Patient is on a statin for his HLD.  Given patient's cardiac stents, he remains on daily DAPT therapy (ASA + clopidogrel); compliant with therapy with no evidence of increased bruising or gastrointestinal bleeding.  T2DM moderately controlled on currently prescribed regimen; last Hgb A1c was 8.0% on 06/07/2020.  No changes were made to patient's medication regimen.  Patient to follow-up with outpatient cardiology in 4 months or sooner if needed.  Patient is scheduled to undergo ENB/EBUS for direct visualization and diagnostic tissue sampling of RLL lung mass.  Given patient's past medical history significant for cardiovascular disease and intervention, presurgical cardiac clearance was sought by the PAT team. Per cardiology, "this patient is optimized for surgery and may proceed with the planned procedural course with a MODERATE risk stratification".  Again, this patient is on DAPT therapy.  He has been instructed on recommendations from  cardiology for holding both his ASA and clopidogrel for 7 days prior to his procedure with plans to  restart as soon as postoperative bleeding risk felt to be minimized by attending surgeon.  Patient has been instructed that his last dose of the aforementioned medications will be on 06/15/2020.  He denies previous perioperative complications with anesthesia. He underwent a general anesthetic course here (ASA III) in 02/2020 with no documented complications.   Vitals with BMI 06/18/2020 05/25/2020 03/09/2020  Height '5\' 4"'  '5\' 0"'  '5\' 0"'   Weight 138 lbs 136 lbs 2 oz 127 lbs  BMI 23.68 33.00 76.2  Systolic - 263 -  Diastolic - 67 -  Pulse - 88 -    Providers/Specialists:   NOTE: Primary physician provider listed below. Patient may have been seen by APP or partner within same practice.   PROVIDER ROLE / SPECIALTY LAST Charlynn Grimes, MD Pulmonary Medicine (Surgeon)  06/08/2020  Idelle Crouch, MD Primary Care Provider  02/23/2020  Katrine Coho, MD Cardiology  06/07/2020  Mee Hives, MD Endocrinology  06/07/2020  Earlie Server, MD Hematology / Oncology  05/25/2020   Allergies:  Patient has no known allergies.  Current Home Medications:   No current facility-administered medications for this encounter.   Marland Kitchen aspirin 81 MG EC tablet  . atorvastatin (LIPITOR) 40 MG tablet  . buPROPion (WELLBUTRIN) 75 MG tablet  . clopidogrel (PLAVIX) 75 MG tablet  . empagliflozin (JARDIANCE) 10 MG TABS tablet  . ENTRESTO 49-51 MG  . glimepiride (AMARYL) 4 MG tablet  . ipratropium-albuterol (DUONEB) 0.5-2.5 (3) MG/3ML SOLN  . levocetirizine (XYZAL) 5 MG tablet  . metFORMIN (GLUCOPHAGE) 1000 MG tablet  . metoprolol succinate (TOPROL-XL) 100 MG 24 hr tablet  . pantoprazole (PROTONIX) 40 MG tablet  . spironolactone (ALDACTONE) 25 MG tablet  . sucralfate (CARAFATE) 1 g tablet  . Blood Glucose Monitoring Suppl (ACCU-CHEK GUIDE ME) w/Device KIT  . ondansetron (ZOFRAN ODT) 4 MG disintegrating tablet  . ONE TOUCH ULTRA TEST test strip  . TRULICITY 1.5 FH/5.4TG SOPN   History:   Past Medical  History:  Diagnosis Date  . AICD (automatic cardioverter/defibrillator) present   . Angina at rest [facility])   . Aortic atherosclerosis ([place])   . CAD (coronary artery disease)    s/p stents in 2017  . Chronic systolic CHF (congestive heart failure) ([place])   . Cirrhosis of liver ([place])   . COPD (chronic obstructive pulmonary disease) ([place])   . Diabetes ([place])   . GERD (gastroesophageal reflux disease)   . Heart attack ([place])   . Hyperlipidemia   . Hypertension   . Ischemic cardiomyopathy   . Paroxysmal ventricular tachycardia ([place])   . Right lower lobe lung mass   . Shortness of breath dyspnea   . Sleep apnea    Past Surgical History:  Procedure Laterality Date  . CARDIAC PACEMAKER PLACEMENT    . cardiac stents    . CARDIAC SURGERY     defibrillator and pacemaker  . CORONARY ANGIOPLASTY    . ESOPHAGOGASTRODUODENOSCOPY N/A 09/19/2014   Procedure: ESOPHAGOGASTRODUODENOSCOPY (EGD);  Surgeon: Lucilla Lame, MD;  Location: [facility] ENDOSCOPY;  Service: Endoscopy;  Laterality: N/A;  . ESOPHAGOGASTRODUODENOSCOPY (EGD) WITH PROPOFOL N/A 09/28/2017   Procedure: ESOPHAGOGASTRODUODENOSCOPY (EGD) WITH PROPOFOL;  Surgeon: Virgel Manifold, MD;  Location: [facility] ENDOSCOPY;  Service: Endoscopy;  Laterality: N/A;  . ESOPHAGOGASTRODUODENOSCOPY (EGD) WITH PROPOFOL N/A 04/19/2018   Procedure: ESOPHAGOGASTRODUODENOSCOPY (EGD) WITH PROPOFOL with Gastric Mapping;  Surgeon: [name], MD;  Location:  [facility] ENDOSCOPY;  Service: Gastroenterology;  Laterality: N/A;  . ESOPHAGOGASTRODUODENOSCOPY (EGD) WITH PROPOFOL N/A 12/13/2018   Procedure: ESOPHAGOGASTRODUODENOSCOPY (EGD) WITH PROPOFOL;  Surgeon: [name], MD;  Location: [facility] ENDOSCOPY;  Service: Gastroenterology;  Laterality: N/A;  . ESOPHAGOGASTRODUODENOSCOPY (EGD) WITH PROPOFOL N/A 03/31/2019   Procedure: ESOPHAGOGASTRODUODENOSCOPY (EGD) WITH PROPOFOL;  Surgeon: Lin Landsman, MD;  Location: [facility] ENDOSCOPY;  Service: Gastroenterology;  Laterality: N/A;  .  IMPLANTABLE CARDIOVERTER DEFIBRILLATOR (ICD) GENERATOR CHANGE Left 02/21/2020   Procedure: ICD GENERATOR CHANGE;  Surgeon: Isaias Cowman, MD;  Location: [facility] ORS;  Service: Cardiovascular;  Laterality: Left;   Family History  Problem Relation Age of Onset  . Diabetes Mother   . Heart attack Father 70   Social History   Tobacco Use  . Smoking status: Current Every Day Smoker    Packs/day: 0.25    Years: 40.00    Pack years: 10.00    Types: Cigarettes  . Smokeless tobacco: Never Used  Vaping Use  . Vaping Use: Never used  Substance Use Topics  . Alcohol use: No  . Drug use: Never    Pertinent Clinical Results:  LABS: Labs reviewed: Acceptable for surgery.  Hospital Outpatient Visit on 06/20/2020  Component Date Value Ref Range Status  . SARS Coronavirus 2 06/20/2020 NEGATIVE  NEGATIVE Final   Comment: (NOTE) SARS-CoV-2 target nucleic acids are NOT DETECTED.  The SARS-CoV-2 RNA is generally detectable in upper and lower respiratory specimens during the acute phase of infection. Negative results do not preclude SARS-CoV-2 infection, do not rule out co-infections with other pathogens, and should not be used as the sole basis for treatment or other patient management decisions. Negative results must be combined with clinical observations, patient history, and epidemiological information. The expected result is Negative.  Fact Sheet for Patients: SugarRoll.be  Fact Sheet for Healthcare Providers: https://www.woods-mathews.com/  This test is not yet approved or cleared by the [place] FDA and  has been authorized for detection and/or diagnosis of SARS-CoV-2 by FDA under an Emergency Use Authorization (EUA). This EUA will remain  in effect (meaning this test can be used) for the duration of the COVID-19 declaration under Se                          ction 564(b)(1) of the Act, 21 U.S.C. section 360bbb-3(b)(1), unless the  authorization is terminated or revoked sooner.  Performed at [facility], [place] [address], [place], [place] [address]   . Prothrombin Time 06/20/2020 13.6  11.4 - 15.2 seconds Final  . INR 06/20/2020 1.1  0.8 - 1.2 Final   Comment: (NOTE) INR goal varies based on device and disease states. Performed at [facility], [address] [place], [place] [address]   . aPTT 06/20/2020 32  24 - 36 seconds Final   Performed at [facility], [address] [place], [place] [address]  Appointment on 05/25/2020  Component Date Value Ref Range Status  . LDH 05/25/2020 149  98 - 192 U/L Final   Performed at [facility], [address] [place], [place] [address]  . Sodium 05/25/2020 130* 135 - 145 mmol/L Final  . Potassium 05/25/2020 3.6  3.5 - 5.1 mmol/L Final  . Chloride 05/25/2020 95* 98 - 111 mmol/L Final  . CO2 05/25/2020 28  22 - 32 mmol/L Final  . Glucose, Bld 05/25/2020 158* 70 - 99 mg/dL Final   Glucose reference range applies only to samples  taken after fasting for at least 8 hours.  . BUN 05/25/2020 13  8 - 23 mg/dL Final  . Creatinine, Ser 05/25/2020 0.61  0.61 - 1.24 mg/dL Final  . Calcium 05/25/2020 8.7* 8.9 - 10.3 mg/dL Final  . Total Protein 05/25/2020 6.9  6.5 - 8.1 g/dL Final  . Albumin 05/25/2020 3.6  3.5 - 5.0 g/dL Final  . AST 05/25/2020 27  15 - 41 U/L Final  . ALT 05/25/2020 21  0 - 44 U/L Final  . Alkaline Phosphatase 05/25/2020 84  38 - 126 U/L Final  . Total Bilirubin 05/25/2020 0.4  0.3 - 1.2 mg/dL Final  . GFR, Estimated 05/25/2020 >60  >60 mL/min Final   Comment: (NOTE) Calculated using the CKD-EPI Creatinine Equation (2021)   . Anion gap 05/25/2020 7  5 - 15 Final   Performed at [facility], [address] [place], [place] [address]  . WBC 05/25/2020 5.6  4.0 - 10.5 K/uL Final  . RBC 05/25/2020 3.98* 4.22 - 5.81 MIL/uL Final  . Hemoglobin 05/25/2020 11.9* 13.0 - 17.0 g/dL Final  . HCT 05/25/2020 35.9* 39.0 - 52.0 %  Final  . MCV 05/25/2020 90.2  80.0 - 100.0 fL Final  . MCH 05/25/2020 29.9  26.0 - 34.0 pg Final  . MCHC 05/25/2020 33.1  30.0 - 36.0 g/dL Final  . RDW 05/25/2020 15.6* 11.5 - 15.5 % Final  . Platelets 05/25/2020 209  150 - 400 K/uL Final  . nRBC 05/25/2020 0.0  0.0 - 0.2 % Final  . Neutrophils Relative % 05/25/2020 67  % Final  . Neutro Abs 05/25/2020 3.7  1.7 - 7.7 K/uL Final  . Lymphocytes Relative 05/25/2020 22  % Final  . Lymphs Abs 05/25/2020 1.2  0.7 - 4.0 K/uL Final  . Monocytes Relative 05/25/2020 9  % Final  . Monocytes Absolute 05/25/2020 0.5  0.1 - 1.0 K/uL Final  . Eosinophils Relative 05/25/2020 2  % Final  . Eosinophils Absolute 05/25/2020 0.1  0.0 - 0.5 K/uL Final  . Basophils Relative 05/25/2020 0  % Final  . Basophils Absolute 05/25/2020 0.0  0.0 - 0.1 K/uL Final  . Immature Granulocytes 05/25/2020 0  % Final  . Abs Immature Granulocytes 05/25/2020 0.02  0.00 - 0.07 K/uL Final   Performed at [facility] [address] [place], [place] [address]    ECG: Date: 06/20/2020 Time ECG obtained: 1116 AM Rate: 78 bpm Rhythm:  Sinus rhythm with PACs; ILBBB; meets voltage criteria for LVH Intervals: PR 182 ms. QRS 110 ms. QTc 451 ms. ST segment and T wave changes: Inferior TWIs more pronounced likely secondary to LVH and ILBBB  Comparison: Similar to previous tracing obtained on 01/25/2020   IMAGING / PROCEDURES: PULMONARY FUNCTION TESTING performed on 03/20/2020 1. Spirometry is c/w moderate obstruction  FVC was 1.80 liters, 76% of predicted  FEV1 was 1.23, 65% of predicted  FEV1 ratio was 68  FEF 25-75% liters per second was 33% of predicted  View as Post, patient took Albuterol SMALL VOLUME NEBULIZER 2 hours prior to PULMONARY FUNCTION TEST 2. TLC is mildly down  TLC was 76% of predicted  RV was 83% of predicted 3. DLCO is mildly decreased, DLCO/VA is in normal range  DLCO was 74% of predicted  DLCO/VA was 105% of predicted 4. Compared to  previous study, numbers are essentially consistent. Spirometry is improved, but also view as post. Patient's weight is down 17 lbs from previous  ECHOCARDIOGRAM performed on  03/07/2020 1. Moderate to severe left ventricular systolic dysfunction with an estimated EF of 25-30% 2. Normal right ventricular systolic function 3. Mild tricuspid and mitral valve insufficiency 4. Trace aortic valve insufficiency 5. Trivial pulmonic valve insufficiency 6. No valvular stenosis 7. Mild left ventricular enlargement 8. Pacer wire noted  9. No evidence of pericardial effusion  LEXISCAN performed on 03/07/2020 1. Regional wall motion demonstrates hypokinesis of the global walls with inferior akinesis 2. No artifacts noted 3. Severe left ventricular enlargement 4. Severely depressed left ventricular systolic function with an EF of 20-25% 5. Large inferior apical scar with no evidence of redistribution 6. The overall quality of the study is fair. 7. Study consistent with ischemic cardiomyopathy with no evidence of reversible ischemia   Impression and Plan:  ROSTON GRUNEWALD has been referred for pre-anesthesia review and clearance prior to him undergoing the planned anesthetic and procedural courses. Available labs, pertinent testing, and imaging results were personally reviewed by me. This patient has been appropriately cleared by cardiology with overall MODERATE risk for significant perioperative cardiovascular complications.  Again patient has an AICD in place.  Perioperative prescription for implanted cardiac device programming form completed by cardiologist and placed on patient's chart for review by surgical/anesthetic team.  Based on clinical review performed today (06/21/20), barring any significant acute changes in the patient's overall condition, it is anticipated that he will be able to proceed with the planned surgical intervention. Any acute changes in clinical condition may necessitate his  procedure being postponed and/or cancelled. Pre-surgical instructions were reviewed with the patient during his PAT appointment and questions were fielded by PAT clinical staff.  Honor Loh, MSN, APRN, FNP-C, CEN [facility]  Peri-operative Services Nurse Practitioner Phone: [phone] 06/21/20 5:18 PM  NOTE: This note has been prepared using Dragon dictation software. Despite my best ability to proofread, there is always the potential that unintentional transcriptional errors may still occur from this process.

## 2020-06-18 NOTE — Progress Notes (Signed)
This patient returns to my office for at risk foot care.  This patient requires this care by a professional since this patient will be at risk due to having diabetes and coagulation defect.  Patient is taking plavix.  Patient has not been seen for years.  This patient is unable to cut nails himself since the patient cannot reach his nails.These nails are painful walking and wearing shoes.  This patient presents for at risk foot care today.  General Appearance  Alert, conversant and in no acute stress.  Vascular  Dorsalis pedis and posterior tibial  pulses are weakly  palpable  bilaterally.  Capillary return is within normal limits  bilaterally. Cold feet  Bilaterally.  Absent digital hair.  Neurologic  Senn-Weinstein monofilament wire test within normal limits  bilaterally. Muscle power within normal limits bilaterally.  Nails Thick disfigured discolored nails with subungual debris  from hallux to fifth toes bilaterally. No evidence of bacterial infection or drainage bilaterally.  Orthopedic  No limitations of motion  feet .  No crepitus or effusions noted.  No bony pathology or digital deformities noted.  Skin  normotropic skin with no porokeratosis noted bilaterally.  No signs of infections or ulcers noted.     Onychomycosis  Pain in right toes  Pain in left toes  Consent was obtained for treatment procedures.   Mechanical debridement of nails 1-5  bilaterally performed with a nail nipper.  Filed with dremel without incident.    Return office visit   3 months                  Told patient to return for periodic foot care and evaluation due to potential at risk complications.   Gardiner Barefoot DPM

## 2020-06-18 NOTE — Patient Instructions (Signed)
Your procedure is scheduled on: 06/22/20 Report to [facility]. To find out your arrival time please call [phone] between 1PM - 3PM on 06/21/20 if you do not here from them..  Remember: Instructions that are not followed completely may result in serious medical risk, up to and including death, or upon the discretion of your surgeon and anesthesiologist your surgery may need to be rescheduled.     _X__ 1. Do not eat food or drink anything after midnight the night before your procedure.                 No gum chewing or hard candies.   __X__2.  On the morning of surgery brush your teeth with toothpaste and water, you                 may rinse your mouth with mouthwash if you wish.  Do not swallow any              toothpaste of mouthwash.     _X__ 3.  No Alcohol for 24 hours before or after surgery.   _X__ 4.  Do Not Smoke or use e-cigarettes For 24 Hours Prior to Your Surgery.                 Do not use any chewable tobacco products for at least 6 hours prior to                 surgery.  ____  5.  Bring all medications with you on the day of surgery if instructed.   __X__  6.  Notify your doctor if there is any change in your medical condition      (cold, fever, infections).     Do not wear jewelry, make-up, hairpins, clips or nail polish. Do not wear lotions, powders, or perfumes.  Do not shave 48 hours prior to surgery. Men may shave face and neck. Do not bring valuables to the hospital.    [facility] is not responsible for any belongings or valuables.  Contacts, dentures/partials or body piercings may not be worn into surgery. Bring a case for your contacts, glasses or hearing aids, a denture cup will be supplied. Leave your suitcase in the car. After surgery it may be brought to your room. For patients admitted to the hospital, discharge time is determined by your treatment team.   Patients discharged the day of surgery  will not be allowed to drive home.   Please read over the following fact sheets that you were given:     __X__ Take these medicines the morning of surgery with A SIP OF WATER:    1. atorvastatin (LIPITOR) 40 MG tablet  2. buPROPion (WELLBUTRIN) 75 MG tablet  3. metoprolol succinate (TOPROL-XL) 100 MG 24 hr tablet  4. pantoprazole (PROTONIX) 40 MG tablet  5.  6.  ____ Fleet Enema (as directed)   ____ Use CHG Soap/SAGE wipes as directed  __X__ Use inhalers on the day of surgery  USE YOUR NEBULIZER BEFORE SURGERY  __X__ Stop metformin/Janumet/Farxiga 2 days prior to surgery NO METFORMIN Wednesday OR Thursday    ____ Take 1/2 of usual insulin dose the night before surgery. No insulin the morning          of surgery.   __X__ Stop Blood Thinners Coumadin/Plavix/Xarelto/Pleta/Pradaxa/Eliquis/Effient/Aspirin  on   Or contact your Surgeon, Cardiologist or Medical Doctor regarding  ability to stop your blood  thinners  __X__ Stop Anti-inflammatories 7 days before surgery such as Advil, Ibuprofen, Motrin,  BC or Goodies Powder, Naprosyn, Naproxen, Aleve, Aspirin    __X__ Stop all herbal supplements, fish oil or vitamin E until after surgery.    ____ Bring C-Pap to the hospital.

## 2020-06-19 ENCOUNTER — Encounter: Payer: Self-pay | Admitting: Urgent Care

## 2020-06-20 ENCOUNTER — Other Ambulatory Visit: Payer: Self-pay

## 2020-06-20 ENCOUNTER — Encounter
Admission: RE | Admit: 2020-06-20 | Discharge: 2020-06-20 | Disposition: A | Discharge status: Home / Self Care | Payer: Medicare Other | Source: Ambulatory Visit | Attending: Pulmonary Disease | Admitting: Pulmonary Disease

## 2020-06-20 DIAGNOSIS — Z01818 Encounter for other preprocedural examination: Secondary | ICD-10-CM | POA: Diagnosis not present

## 2020-06-20 DIAGNOSIS — Z20822 Contact with and (suspected) exposure to covid-19: Secondary | ICD-10-CM | POA: Insufficient documentation

## 2020-06-20 LAB — PROTIME-INR
INR: 1.1 (ref 0.8–1.2)
Prothrombin Time: 13.6 seconds (ref 11.4–15.2)

## 2020-06-20 LAB — APTT: aPTT: 32 seconds (ref 24–36)

## 2020-06-20 LAB — SARS CORONAVIRUS 2 (TAT 6-24 HRS): SARS Coronavirus 2: NEGATIVE

## 2020-06-22 ENCOUNTER — Encounter
Admission: RE | Disposition: A | Discharge status: Home / Self Care | Payer: Self-pay | Source: Home / Self Care | Attending: Pulmonary Disease

## 2020-06-22 ENCOUNTER — Ambulatory Visit
Admission: RE | Admit: 2020-06-22 | Discharge: 2020-06-22 | Disposition: A | Discharge status: Home / Self Care | Payer: Medicare Other | Attending: Pulmonary Disease | Admitting: Pulmonary Disease

## 2020-06-22 ENCOUNTER — Ambulatory Visit: Payer: Medicare Other | Admitting: Urgent Care

## 2020-06-22 ENCOUNTER — Ambulatory Visit: Payer: Medicare Other

## 2020-06-22 ENCOUNTER — Ambulatory Visit
Admission: RE | Admit: 2020-06-22 | Discharge: 2020-06-22 | Disposition: A | Discharge status: Home / Self Care | Payer: Medicare Other | Source: Home / Self Care | Attending: Pulmonary Disease | Admitting: Pulmonary Disease

## 2020-06-22 DIAGNOSIS — Z7982 Long term (current) use of aspirin: Secondary | ICD-10-CM | POA: Insufficient documentation

## 2020-06-22 DIAGNOSIS — Z7902 Long term (current) use of antithrombotics/antiplatelets: Secondary | ICD-10-CM | POA: Diagnosis not present

## 2020-06-22 DIAGNOSIS — F17210 Nicotine dependence, cigarettes, uncomplicated: Secondary | ICD-10-CM | POA: Insufficient documentation

## 2020-06-22 DIAGNOSIS — R590 Localized enlarged lymph nodes: Secondary | ICD-10-CM | POA: Insufficient documentation

## 2020-06-22 DIAGNOSIS — T17590A Other foreign object in bronchus causing asphyxiation, initial encounter: Secondary | ICD-10-CM | POA: Diagnosis not present

## 2020-06-22 DIAGNOSIS — R918 Other nonspecific abnormal finding of lung field: Secondary | ICD-10-CM

## 2020-06-22 DIAGNOSIS — Z7984 Long term (current) use of oral hypoglycemic drugs: Secondary | ICD-10-CM | POA: Diagnosis not present

## 2020-06-22 DIAGNOSIS — X58XXXA Exposure to other specified factors, initial encounter: Secondary | ICD-10-CM | POA: Diagnosis not present

## 2020-06-22 DIAGNOSIS — R911 Solitary pulmonary nodule: Secondary | ICD-10-CM | POA: Insufficient documentation

## 2020-06-22 DIAGNOSIS — Z95810 Presence of automatic (implantable) cardiac defibrillator: Secondary | ICD-10-CM | POA: Diagnosis not present

## 2020-06-22 DIAGNOSIS — J8410 Pulmonary fibrosis, unspecified: Secondary | ICD-10-CM | POA: Insufficient documentation

## 2020-06-22 HISTORY — PX: VIDEO BRONCHOSCOPY WITH ENDOBRONCHIAL NAVIGATION: SHX6175

## 2020-06-22 HISTORY — DX: Other forms of angina pectoris: I20.8

## 2020-06-22 HISTORY — DX: Ventricular tachycardia: I47.2

## 2020-06-22 HISTORY — DX: Presence of automatic (implantable) cardiac defibrillator: Z95.810

## 2020-06-22 HISTORY — DX: Other ventricular tachycardia: I47.29

## 2020-06-22 HISTORY — DX: Other forms of angina pectoris: I20.89

## 2020-06-22 HISTORY — DX: Atherosclerosis of aorta: I70.0

## 2020-06-22 HISTORY — DX: Ischemic cardiomyopathy: I25.5

## 2020-06-22 HISTORY — DX: Unspecified cirrhosis of liver: K74.60

## 2020-06-22 HISTORY — DX: Other nonspecific abnormal finding of lung field: R91.8

## 2020-06-22 HISTORY — PX: VIDEO BRONCHOSCOPY WITH ENDOBRONCHIAL ULTRASOUND: SHX6177

## 2020-06-22 HISTORY — DX: Ventricular tachycardia, unspecified: I47.20

## 2020-06-22 LAB — PROTIME-INR
INR: 1.1 (ref 0.8–1.2)
Prothrombin Time: 13.8 seconds (ref 11.4–15.2)

## 2020-06-22 LAB — GLUCOSE, CAPILLARY
Glucose-Capillary: 137 mg/dL — ABNORMAL HIGH (ref 70–99)
Glucose-Capillary: 130 mg/dL — ABNORMAL HIGH (ref 70–99)
Glucose-Capillary: 172 mg/dL — ABNORMAL HIGH (ref 70–99)

## 2020-06-22 SURGERY — VIDEO BRONCHOSCOPY WITH ENDOBRONCHIAL NAVIGATION
Anesthesia: General

## 2020-06-22 MED ORDER — LIDOCAINE HCL (PF) 2 % IJ SOLN
INTRAMUSCULAR | Status: AC
  Filled 2020-06-22: qty 5

## 2020-06-22 MED ORDER — PROPOFOL 10 MG/ML IV BOLUS
INTRAVENOUS | Status: AC
  Filled 2020-06-22: qty 20

## 2020-06-22 MED ORDER — EPHEDRINE SULFATE 50 MG/ML IJ SOLN
INTRAMUSCULAR | Status: DC | PRN
  Administered 2020-06-22: 66 mg via INTRAVENOUS

## 2020-06-22 MED ORDER — SODIUM CHLORIDE 0.9 % IV SOLN
INTRAVENOUS | Status: DC | PRN
  Administered 2020-06-22: 40 ug/min via INTRAVENOUS

## 2020-06-22 MED ORDER — FENTANYL CITRATE (PF) 100 MCG/2ML IJ SOLN: 25.0000 ug | INTRAMUSCULAR | Status: DC | PRN

## 2020-06-22 MED ORDER — OXYCODONE HCL 5 MG PO TABS: 5.0000 mg | ORAL_TABLET | Freq: Once | ORAL | Status: DC | PRN

## 2020-06-22 MED ORDER — ONDANSETRON HCL 4 MG/2ML IJ SOLN: 4.0000 mg | Freq: Once | INTRAMUSCULAR | Status: DC | PRN

## 2020-06-22 MED ORDER — ONDANSETRON HCL 4 MG/2ML IJ SOLN
INTRAMUSCULAR | Status: DC | PRN
  Administered 2020-06-22: 4 mg via INTRAVENOUS

## 2020-06-22 MED ORDER — SODIUM CHLORIDE 0.9 % IV SOLN: INTRAVENOUS | Status: DC

## 2020-06-22 MED ORDER — ROCURONIUM BROMIDE 100 MG/10ML IV SOLN
INTRAVENOUS | Status: DC | PRN
  Administered 2020-06-22: 10 mg via INTRAVENOUS
  Administered 2020-06-22: 60 mg via INTRAVENOUS

## 2020-06-22 MED ORDER — CHLORHEXIDINE GLUCONATE 0.12 % MT SOLN: 15.0000 mL | Freq: Once | OROMUCOSAL | Status: AC

## 2020-06-22 MED ORDER — IPRATROPIUM-ALBUTEROL 0.5-2.5 (3) MG/3ML IN SOLN
RESPIRATORY_TRACT | Status: AC
  Administered 2020-06-22: 3 mL via RESPIRATORY_TRACT
  Filled 2020-06-22: qty 3

## 2020-06-22 MED ORDER — PHENYLEPHRINE HCL (PRESSORS) 10 MG/ML IV SOLN
INTRAVENOUS | Status: AC
  Filled 2020-06-22: qty 1

## 2020-06-22 MED ORDER — DEXAMETHASONE SODIUM PHOSPHATE 10 MG/ML IJ SOLN
INTRAMUSCULAR | Status: DC | PRN
  Administered 2020-06-22: 8 mg via INTRAVENOUS

## 2020-06-22 MED ORDER — OXYCODONE HCL 5 MG/5ML PO SOLN
5.0000 mg | Freq: Once | ORAL | Status: DC | PRN
Start: 2020-06-22 — End: 2020-06-22

## 2020-06-22 MED ORDER — LIDOCAINE HCL (CARDIAC) PF 100 MG/5ML IV SOSY
PREFILLED_SYRINGE | INTRAVENOUS | Status: DC | PRN
  Administered 2020-06-22: 100 mg via INTRAVENOUS

## 2020-06-22 MED ORDER — PHENYLEPHRINE HCL (PRESSORS) 10 MG/ML IV SOLN
INTRAVENOUS | Status: DC | PRN
  Administered 2020-06-22 (×5): 100 ug via INTRAVENOUS

## 2020-06-22 MED ORDER — ORAL CARE MOUTH RINSE: 15.0000 mL | Freq: Once | OROMUCOSAL | Status: AC

## 2020-06-22 MED ORDER — ACETAMINOPHEN 10 MG/ML IV SOLN: 1000.0000 mg | Freq: Once | INTRAVENOUS | Status: DC | PRN

## 2020-06-22 MED ORDER — SUGAMMADEX SODIUM 500 MG/5ML IV SOLN
INTRAVENOUS | Status: DC | PRN
  Administered 2020-06-22: 150 mg via INTRAVENOUS

## 2020-06-22 MED ORDER — FENTANYL CITRATE (PF) 100 MCG/2ML IJ SOLN
INTRAMUSCULAR | Status: DC | PRN
  Administered 2020-06-22: 100 ug via INTRAVENOUS

## 2020-06-22 MED ORDER — IPRATROPIUM-ALBUTEROL 0.5-2.5 (3) MG/3ML IN SOLN: 3.0000 mL | RESPIRATORY_TRACT | Status: DC

## 2020-06-22 MED ORDER — LIDOCAINE HCL 4 % MT SOLN
OROMUCOSAL | Status: DC | PRN
  Administered 2020-06-22: 4 mL via TOPICAL

## 2020-06-22 MED ORDER — FENTANYL CITRATE (PF) 100 MCG/2ML IJ SOLN
INTRAMUSCULAR | Status: AC
  Filled 2020-06-22: qty 2

## 2020-06-22 MED ORDER — CHLORHEXIDINE GLUCONATE 0.12 % MT SOLN
OROMUCOSAL | Status: AC
  Administered 2020-06-22: 15 mL via OROMUCOSAL
  Filled 2020-06-22: qty 15

## 2020-06-22 MED ORDER — PROPOFOL 10 MG/ML IV BOLUS
INTRAVENOUS | Status: DC | PRN
  Administered 2020-06-22: 20 mg via INTRAVENOUS
  Administered 2020-06-22: 30 mg via INTRAVENOUS

## 2020-06-22 NOTE — Transfer of Care (Signed)
Immediate Anesthesia Transfer of Care Note  Patient: John Frank  Procedure(s) Performed: VIDEO BRONCHOSCOPY WITH ENDOBRONCHIAL NAVIGATION (N/A ) VIDEO BRONCHOSCOPY WITH ENDOBRONCHIAL ULTRASOUND (N/A )  Patient Location: PACU  Anesthesia Type:General  Level of Consciousness: awake and alert   Airway & Oxygen Therapy: Patient Spontanous Breathing and Patient connected to face mask oxygen  Post-op Assessment: Report given to RN and Post -op Vital signs reviewed and stable  Post vital signs: Reviewed and stable  Last Vitals:  Vitals Value Taken Time  BP 105/55 06/22/20 1446  Temp    Pulse 89 06/22/20 1453  Resp 20 06/22/20 1453  SpO2 94 % 06/22/20 1453  Vitals shown include unvalidated device data.  Last Pain:  Vitals:   06/22/20 1104  TempSrc: Temporal  PainSc: 0-No pain         Complications: No complications documented.

## 2020-06-22 NOTE — Procedures (Signed)
ELECTROMAGNETIC NAVIGATIONAL BRONCHOSCOPY PROCEDURE NOTE  FIBEROPTIC BRONCHOSCOPY WITH BRONCHOALVEOLAR LAVAGE PROCEDURE NOTE  ENDOBRONCHIAL ULTRASOUND PROCEDURE NOTE  THERAPEUTIC ASPIRATION OF TRACHEOBRONCHIAL TREE   Flexible bronchoscopy was performed  by : Lanney Gins MD  assistance by : 1)Repiratory therapist  and 2)[employer] staff and 3) Anesthesia team and 4) Flouroscopy team    Indication for the procedure was :  Pre-procedural H&P. The following assessment was performed on the day of the procedure prior to initiating sedation History:  Chest pain n Dyspnea y Hemoptysis n Cough y Fever n Other pertinent items n  Examination Vital signs -reviewed as per nursing documentation today Cardiac    Murmurs: n  Rubs : n  Gallop: n Lungs Wheezing: n Rales : n Rhonchi :y  Other pertinent findings: SOB/hypoxemia due to chronic lung disease   Pre-procedural assessment for Procedural Sedation included: Depth of sedation: As per anesthesia team  ASA Classification:  2 Mallampati airway assessment: 3    Medication list reviewed: y  The patient's interval history was taken and revealed: no new complaints The pre- procedure physical examination revealed: No new findings Refer to prior clinic note for details.  Informed Consent: Informed consent was obtained from:  patient after explanation of procedure and risks, benefits, as well as alternative procedures available.  Explanation of level of sedation and possible transfusion was also provided.    Procedural Preparation: Time out was performed and patient was identified by name and birthdate and procedure to be performed and side for sampling, if any, was specified. Pt was intubated by anesthesia.  The patient was appropriately draped.   Fiberoptic bronchoscopy with airway inspection and BAL Procedure findings:  Bronchoscope was inserted via ETT  without difficulty.  Posterior oropharynx, epiglottis, arytenoids,  false cords and vocal cords were not visualized as these were bypassed by endotracheal tube. The distal trachea was normal in circumference and appearance without mucosal, cartilaginous or branching abnormalities.  The main carina was mildly splayed . All right and left lobar airways were visualized to the Subsegmental level.  Sub- sub segmental carinae were identified in all the distal airways.   Secretions were visible in the following airways and appeared to be clear.  The mucosa was : friable at right lower lobe  Airways were notable for:        exophytic lesions :n       extrinsic compression in the following distributions: n.       Friable mucosa: y       Neurosurgeon /pigmentation: n   -there were multiple airways impacted with thickened mucoid phlegm this was aspirated washed out to clear airway of debris  Post procedure Diagnosis:   Friable mucosa of Right lower lobe     Electromagnetic Navigational Bronchoscopy Procedure Findings:  After appropriate CT-guided planning ENB scope was advanced via endotracheal tube and LG was advanced for registration.  Post appropriate planning and registration peripheral navigation was used to visualize target lesion.    Post procedure diagnosis:  - posterior right lower lobe nodule       Endobronchial ultrasound assisted hilar and mediastinal lymph node biopsies procedure findings: The fiberoptic bronchoscope was removed and the EBUS scope was introduced. Examination began to evaluate for pathologically enlarged lymph nodes starting on the Left  side progressing to the right side.  All lymph node biopsies performed with 21G needle. Lymph node biopsies were sent in cytolite for all stations.  Station 10L - 1.2cm - biopsied 4 times - good lymphoid shedding -  no atypia noted ROSE Station 7 - 1.6cm - biopsied 4 times - good lymphoid shedding - no atypia noted by [place] - 1.3cm - biopsied 4 times -good lymphoid- no atypia noted  by ROSE    Post procedure diagnosis:     Specimens obtained included:            Cytology brushes : RLL x 4  Broncho-alveolar lavage site:RLL   sent for cytology                             63m volume infused 249mvolume returned with cellular and clotted appearance   Fluoroscopy Used: yes ;        Pictorial documentation attached: 2.75m53m                  Immediate sampling complications included:none  Epinephrine zero ml was used topically  The bronchoscopy was terminated due to completion of the planned procedure and the bronchoscope was removed.   Total dosage of Lidocaine was zerro mg    Estimated Blood loss: 5cc.  Disposition: home with family , spoke to sister of patient SheJudeen Hammanster procedure  Follow up with Dr. AleLanney Gins 5 days for result discussion.     FuaOttie Glazier  [facility]

## 2020-06-22 NOTE — H&P (Signed)
Pulmonary Medicine          Date: 06/22/2020,   MRN# [id number] John Frank [birth year]     Admission                  Current       CHIEF COMPLAINT:   Lung nodule of RLL with lymphandenopathy   HISTORY OF PRESENT ILLNESS   Mr. John Frank is 65 y.o. male who was referred to [facility] Pulmonary clinic due to right lung spiculated nodule.  Patient is here with [name] (sister of patient and current POA). He has POA due to low intelect as per sis Patient is a alife long smoker, currently actively smoking. He is coughing daily and sister states he "passes out", and produces phlegm intermittenlty. He is able to ambulate with onset of dyspnea after >156m on level grade and current mMRC at 2. His overal CAT score today is 12. We reviewed CT imaging together and discussed plan for bronchoscopy and biopsy of RLL lung nodule. Patient does have constintutional symptoms with >40lbs weight loss. He was asked by his doctor to take ensure but he was unable to do so due to diarreah.       PAST MEDICAL HISTORY   Past Medical History:  Diagnosis Date  . AICD (automatic cardioverter/defibrillator) present   . Angina at rest [facility])   . Aortic atherosclerosis ([place])   . CAD (coronary artery disease)    s/p stents in 2017  . Chronic systolic CHF (congestive heart failure) ([place])   . Cirrhosis of liver ([place])   . COPD (chronic obstructive pulmonary disease) ([place])   . Diabetes ([place])   . GERD (gastroesophageal reflux disease)   . Heart attack ([place])   . Hyperlipidemia   . Hypertension   . Ischemic cardiomyopathy   . Paroxysmal ventricular tachycardia ([place])   . Right lower lobe lung mass   . Shortness of breath dyspnea   . Sleep apnea      SURGICAL HISTORY   Past Surgical History:  Procedure Laterality Date  . CARDIAC PACEMAKER PLACEMENT    . cardiac stents    . CARDIAC SURGERY     defibrillator and pacemaker  . CORONARY ANGIOPLASTY    . ESOPHAGOGASTRODUODENOSCOPY N/A  09/19/2014   Procedure: ESOPHAGOGASTRODUODENOSCOPY (EGD);  Surgeon: Lucilla Lame, MD;  Location: [facility] ENDOSCOPY;  Service: Endoscopy;  Laterality: N/A;  . ESOPHAGOGASTRODUODENOSCOPY (EGD) WITH PROPOFOL N/A 09/28/2017   Procedure: ESOPHAGOGASTRODUODENOSCOPY (EGD) WITH PROPOFOL;  Surgeon: Virgel Manifold, MD;  Location: [facility] ENDOSCOPY;  Service: Endoscopy;  Laterality: N/A;  . ESOPHAGOGASTRODUODENOSCOPY (EGD) WITH PROPOFOL N/A 04/19/2018   Procedure: ESOPHAGOGASTRODUODENOSCOPY (EGD) WITH PROPOFOL with Gastric Mapping;  Surgeon: [name], MD;  Location: [facility] ENDOSCOPY;  Service: Gastroenterology;  Laterality: N/A;  . ESOPHAGOGASTRODUODENOSCOPY (EGD) WITH PROPOFOL N/A 12/13/2018   Procedure: ESOPHAGOGASTRODUODENOSCOPY (EGD) WITH PROPOFOL;  Surgeon: [name], MD;  Location: [facility] ENDOSCOPY;  Service: Gastroenterology;  Laterality: N/A;  . ESOPHAGOGASTRODUODENOSCOPY (EGD) WITH PROPOFOL N/A 03/31/2019   Procedure: ESOPHAGOGASTRODUODENOSCOPY (EGD) WITH PROPOFOL;  Surgeon: Lin Landsman, MD;  Location: [facility] ENDOSCOPY;  Service: Gastroenterology;  Laterality: N/A;  . IMPLANTABLE CARDIOVERTER DEFIBRILLATOR (ICD) GENERATOR CHANGE Left 02/21/2020   Procedure: ICD GENERATOR CHANGE;  Surgeon: Isaias Cowman, MD;  Location: [facility] ORS;  Service: Cardiovascular;  Laterality: Left;     FAMILY HISTORY   Family History  Problem Relation Age of Onset  . Diabetes Mother   . Heart attack Father 55     SOCIAL  HISTORY   Social History   Tobacco Use  . Smoking status: Current Every Day Smoker    Packs/day: 0.25    Years: 40.00    Pack years: 10.00    Types: Cigarettes  . Smokeless tobacco: Never Used  Vaping Use  . Vaping Use: Never used  Substance Use Topics  . Alcohol use: No  . Drug use: Never     MEDICATIONS    Home Medication:    Current Medication:  Current Facility-Administered Medications:  .  0.9 %  sodium chloride infusion, , Intravenous, Continuous, Ramsdell, Brett Canales, MD, New Bag at 06/22/20 1200  Facility-Administered Medications Ordered in Other Encounters:  .  dexamethasone (DECADRON) injection, , Intravenous, Anesthesia Intra-op, Hedda Slade, CRNA, 8 mg at 06/22/20 1242 .  ePHEDrine injection, , Intravenous, Anesthesia Intra-op, Hedda Slade, CRNA, 66 mg at 06/22/20 1325 .  fentaNYL (SUBLIMAZE) injection, , Intravenous, Anesthesia Intra-op, Hedda Slade, CRNA, 100 mcg at 06/22/20 1215 .  lidocaine (cardiac) 100 mg/51mL (XYLOCAINE) injection 2%, , Intravenous, Anesthesia Intra-op, Starr, Deana, CRNA, 100 mg at 06/22/20 1214 .  Lidocaine HCl 4 % topical solution, , Topical, Anesthesia Intra-op, Hedda Slade, CRNA, 4 mL at 06/22/20 1217 .  ondansetron (ZOFRAN) injection, , Intravenous, Anesthesia Intra-op, Hedda Slade, CRNA, 4 mg at 06/22/20 1242 .  phenylephrine (NEO-SYNEPHRINE) 100 mcg/mL in sodium chloride 0.9 % 100 mL infusion, , Intravenous, Continuous PRN, Hedda Slade, CRNA, Last Rate: 24 mL/hr at 06/22/20 1230, 40 mcg/min at 06/22/20 1230 .  phenylephrine (NEO-SYNEPHRINE) injection, , Intravenous, Anesthesia Intra-op, Hedda Slade, CRNA, 100 mcg at 06/22/20 1233 .  propofol (DIPRIVAN) 10 mg/mL bolus/IV push, , Intravenous, Anesthesia Intra-op, Lorenza Chick, Deana, CRNA, 20 mg at 06/22/20 1216 .  rocuronium (ZEMURON) injection, , Intravenous, Anesthesia Intra-op, Lorenza Chick, Deana, CRNA, 10 mg at 06/22/20 1327 .  sugammadex sodium (BRIDION) injection, , Intravenous, Anesthesia Intra-op, Hedda Slade, CRNA, 150 mg at 06/22/20 1427    ALLERGIES   Patient has no known allergies.     REVIEW OF SYSTEMS    Review of Systems:  Gen:  Denies  fever, sweats, chills weigh loss  HEENT: Denies blurred vision, double vision, ear pain, eye pain, hearing loss, nose bleeds, sore throat Cardiac:  No dizziness, chest pain or heaviness, chest tightness,edema Resp:   Denies cough or sputum porduction, shortness of breath,wheezing, hemoptysis,  Gi: Denies swallowing  difficulty, stomach pain, nausea or vomiting, diarrhea, constipation, bowel incontinence Gu:  Denies bladder incontinence, burning urine Ext:   Denies Joint pain, stiffness or swelling Skin: Denies  skin rash, easy bruising or bleeding or hives Endoc:  Denies polyuria, polydipsia , polyphagia or weight change Psych:   Denies depression, insomnia or hallucinations   Other:  All other systems negative   VS: BP 108/62   Pulse 94   Temp 98.3 F (36.8 C) (Temporal)   Resp 20   SpO2 97%      PHYSICAL EXAM    GENERAL:NAD, no fevers, chills, no weakness no fatigue HEAD: Normocephalic, atraumatic.  EYES: Pupils equal, round, reactive to light. Extraocular muscles intact. No scleral icterus.  MOUTH: Moist mucosal membrane. Dentition intact. No abscess noted.  EAR, NOSE, THROAT: Clear without exudates. No external lesions.  NECK: Supple. No thyromegaly. No nodules. No JVD.  PULMONARY:clear to auscultation with mild rhonchi CARDIOVASCULAR: S1 and S2. Regular rate and rhythm. No murmurs, rubs, or gallops. No edema. Pedal pulses 2+ bilaterally.  GASTROINTESTINAL: Soft, nontender, nondistended. No masses. Positive bowel sounds. No hepatosplenomegaly.  MUSCULOSKELETAL: No  swelling, clubbing, or edema. Range of motion full in all extremities.  NEUROLOGIC: Cranial nerves II through XII are intact. No gross focal neurological deficits. Sensation intact. Reflexes intact.  SKIN: No ulceration, lesions, rashes, or cyanosis. Skin warm and dry. Turgor intact.  PSYCHIATRIC: Mood, affect within normal limits. The patient is awake, alert and oriented x 3. Insight, judgment intact.       IMAGING    DG C-Arm 1-60 Min-No Report  Result Date: 06/22/2020 Fluoroscopy was utilized by the requesting physician.  No radiographic interpretation.      ASSESSMENT/PLAN    . 1.9 x 1.8 cm spiculated retro hilar right lower lobe pulmonary  nodule - patient last had general anesthesia in 2013 for cardiac  surgery and moderate sedation for colonoscopy after that.  -we discussed ENB/EBUS and patient is agreeable  -Reviewed risks/complications and benefits with patient. Risks include infection, pneumothorax/pneumomediastinum which may require chest tube placement as well as overnight/prolonged hospitalization and possible mechanical ventilation. Other risks include bleeding and very rarely death. Patient understands risks and wishes to proceed. Additional questions were answered, and patient is aware that post procedure patient will be going home with family and may experience cough with possible clots on expectoration as well as phlegm which may last few days as well as hoarseness of voice post intubation and mechanical ventilation. -patient is on plavix and asa - we have held this for past 7 days      Thank you for allowing me to participate in the care of this patient.   Patient/Family are satisfied with care plan and all questions have been answered.  This document was prepared using Dragon voice recognition software and may include unintentional dictation errors.     Ottie Glazier, M.D.  Division of [facility]

## 2020-06-22 NOTE — Anesthesia Procedure Notes (Signed)
Procedure Name: Intubation Date/Time: 06/22/2020 12:18 PM Performed by: Hedda Slade, CRNA Pre-anesthesia Checklist: Patient identified, Patient being monitored, Timeout performed, Emergency Drugs available and Suction available Patient Re-evaluated:Patient Re-evaluated prior to induction Oxygen Delivery Method: Circle system utilized Preoxygenation: Pre-oxygenation with 100% oxygen Induction Type: IV induction Ventilation: Mask ventilation without difficulty and Oral airway inserted - appropriate to patient size Laryngoscope Size: McGraph and 4 Grade View: Grade I Tube type: Oral Tube size: 8.5 mm Number of attempts: 1 Airway Equipment and Method: Stylet and Video-laryngoscopy Placement Confirmation: ETT inserted through vocal cords under direct vision,  positive ETCO2 and breath sounds checked- equal and bilateral Secured at: 21 cm Tube secured with: Tape Dental Injury: Teeth and Oropharynx as per pre-operative assessment

## 2020-06-22 NOTE — Anesthesia Preprocedure Evaluation (Signed)
Anesthesia Evaluation  Patient identified by MRN, date of birth, ID band Patient awake    Reviewed: Allergy & Precautions, NPO status , Patient's Chart, lab work & pertinent test results  History of Anesthesia Complications Negative for: history of anesthetic complications  Airway Mallampati: II  TM Distance: >3 FB Neck ROM: Full    Dental  (+) Edentulous Upper, Edentulous Lower   Pulmonary shortness of breath, sleep apnea , COPD, Current Smoker and Patient abstained from smoking.,    - rhonchi + decreased breath sounds(-) wheezing      Cardiovascular hypertension, Pt. on medications + CAD, + Past MI, + Cardiac Stents (2017) and +CHF  (-) CABG + pacemaker + Cardiac Defibrillator  Rhythm:Regular Rate:Normal - Systolic murmurs and - Diastolic murmurs Large severe fixed perfusion defect on nuclear stress test.  TTE 2021: INTERPRETATION  MODERATE-TO-SEVERE LV SYSTOLIC DYSFUNCTION WITH AN ESTIMATED EF = 25-30 %  NORMAL RIGHT VENTRICULAR SYSTOLIC FUNCTION  MILD TRICUSPID AND MITRAL VALVE INSUFFICIENCY  TRACE AORTIC VALVE INSUFFICIENCY  NO VALVULAR STENOSIS  MILD LV ENLARGEMENT  PACER WIRE NOTED     Neuro/Psych neg Seizures negative neurological ROS  negative psych ROS   GI/Hepatic Neg liver ROS, PUD, GERD  ,  Endo/Other  diabetes, Oral Hypoglycemic Agents  Renal/GU negative Renal ROS     Musculoskeletal negative musculoskeletal ROS (+)   Abdominal (+) - obese,   Peds  Hematology  (+) anemia ,   Anesthesia Other Findings Past Medical History: No date: CAD (coronary artery disease)     Comment:  s/p stents in 2017 No date: Chronic systolic CHF (congestive heart failure) (H[facility]) No date: COPD (chronic obstructive pulmonary disease) ([facility]) No date: Diabetes ([facility]) No date: GERD (gastroesophageal reflux disease) No date: Heart attack ([place]) No date: Hyperlipidemia No date: Hypertension No date: Presence of permanent  cardiac pacemaker     Comment:  and defibrillator No date: Shortness of breath dyspnea No date: Sleep apnea'  Reproductive/Obstetrics                             Anesthesia Physical  Anesthesia Plan  ASA: III  Anesthesia Plan: General   Post-op Pain Management:    Induction: Intravenous  PONV Risk Score and Plan: 1 and Ondansetron and Dexamethasone  Airway Management Planned: Oral ETT  Additional Equipment: None  Intra-op Plan:   Post-operative Plan: Extubation in OR  Informed Consent: I have reviewed the patients History and Physical, chart, labs and discussed the procedure including the risks, benefits and alternatives for the proposed anesthesia with the patient or authorized representative who has indicated his/her understanding and acceptance.     Dental advisory given  Plan Discussed with: CRNA and Anesthesiologist  Anesthesia Plan Comments: (Discussed risks of anesthesia with patient, including PONV, sore throat, lip/dental damage. Rare risks discussed as well, such as cardiorespiratory and neurological sequelae. Patient understands. Patient counseled on being higher risk for anesthesia due to comorbidities: HFrEF. Patient was told about increased risk of cardiac and respiratory events, including death. Patient understands. )        Anesthesia Quick Evaluation

## 2020-06-22 NOTE — Anesthesia Postprocedure Evaluation (Signed)
Anesthesia Post Note  Patient: STEFAN MARKARIAN  Procedure(s) Performed: VIDEO BRONCHOSCOPY WITH ENDOBRONCHIAL NAVIGATION (N/A ) VIDEO BRONCHOSCOPY WITH ENDOBRONCHIAL ULTRASOUND (N/A )  Patient location during evaluation: PACU Anesthesia Type: General Level of consciousness: awake and alert Pain management: pain level controlled Vital Signs Assessment: post-procedure vital signs reviewed and stable Respiratory status: spontaneous breathing, nonlabored ventilation, respiratory function stable and patient connected to nasal cannula oxygen Cardiovascular status: blood pressure returned to baseline and stable Postop Assessment: no apparent nausea or vomiting Anesthetic complications: no   No complications documented.   Last Vitals:  Vitals:   06/22/20 1446 06/22/20 1500  BP: (!) 105/55 96/63  Pulse: 95 84  Resp: 10 18  Temp:    SpO2: 99% 93%    Last Pain:  Vitals:   06/22/20 1445  TempSrc:   PainSc: 0-No pain                 Arita Miss

## 2020-06-23 ENCOUNTER — Encounter: Payer: Self-pay | Admitting: Pulmonary Disease

## 2020-06-25 ENCOUNTER — Ambulatory Visit: Payer: Medicare Other

## 2020-06-25 LAB — SURGICAL PATHOLOGY

## 2020-06-26 LAB — CYTOLOGY - NON PAP

## 2020-06-28 ENCOUNTER — Ambulatory Visit
Admission: RE | Admit: 2020-06-28 | Discharge: 2020-06-28 | Disposition: A | Discharge status: Home / Self Care | Payer: Medicare Other | Source: Ambulatory Visit | Attending: Radiation Oncology | Admitting: Radiation Oncology

## 2020-06-28 ENCOUNTER — Inpatient Hospital Stay: Payer: Medicare Other | Attending: Oncology | Admitting: Oncology

## 2020-06-28 ENCOUNTER — Encounter: Payer: Self-pay | Admitting: Oncology

## 2020-06-28 ENCOUNTER — Encounter: Payer: Self-pay | Admitting: *Deleted

## 2020-06-28 VITALS — BP 93/72 | HR 97 | Temp 97.7°F | Resp 18 | Wt 137.8 lb

## 2020-06-28 DIAGNOSIS — F17210 Nicotine dependence, cigarettes, uncomplicated: Secondary | ICD-10-CM | POA: Diagnosis not present

## 2020-06-28 DIAGNOSIS — Z7984 Long term (current) use of oral hypoglycemic drugs: Secondary | ICD-10-CM | POA: Diagnosis not present

## 2020-06-28 DIAGNOSIS — I700 Atherosclerosis of aorta: Secondary | ICD-10-CM | POA: Insufficient documentation

## 2020-06-28 DIAGNOSIS — R918 Other nonspecific abnormal finding of lung field: Secondary | ICD-10-CM | POA: Diagnosis present

## 2020-06-28 DIAGNOSIS — I255 Ischemic cardiomyopathy: Secondary | ICD-10-CM | POA: Diagnosis not present

## 2020-06-28 DIAGNOSIS — J439 Emphysema, unspecified: Secondary | ICD-10-CM | POA: Diagnosis not present

## 2020-06-28 DIAGNOSIS — R222 Localized swelling, mass and lump, trunk: Secondary | ICD-10-CM | POA: Insufficient documentation

## 2020-06-28 DIAGNOSIS — J90 Pleural effusion, not elsewhere classified: Secondary | ICD-10-CM | POA: Diagnosis not present

## 2020-06-28 DIAGNOSIS — E785 Hyperlipidemia, unspecified: Secondary | ICD-10-CM | POA: Diagnosis not present

## 2020-06-28 DIAGNOSIS — E119 Type 2 diabetes mellitus without complications: Secondary | ICD-10-CM | POA: Insufficient documentation

## 2020-06-28 DIAGNOSIS — Z833 Family history of diabetes mellitus: Secondary | ICD-10-CM | POA: Diagnosis not present

## 2020-06-28 DIAGNOSIS — R059 Cough, unspecified: Secondary | ICD-10-CM | POA: Insufficient documentation

## 2020-06-28 DIAGNOSIS — K219 Gastro-esophageal reflux disease without esophagitis: Secondary | ICD-10-CM | POA: Diagnosis not present

## 2020-06-28 DIAGNOSIS — I2510 Atherosclerotic heart disease of native coronary artery without angina pectoris: Secondary | ICD-10-CM | POA: Diagnosis not present

## 2020-06-28 DIAGNOSIS — I5022 Chronic systolic (congestive) heart failure: Secondary | ICD-10-CM | POA: Insufficient documentation

## 2020-06-28 DIAGNOSIS — Z7982 Long term (current) use of aspirin: Secondary | ICD-10-CM | POA: Diagnosis not present

## 2020-06-28 DIAGNOSIS — Z8249 Family history of ischemic heart disease and other diseases of the circulatory system: Secondary | ICD-10-CM | POA: Insufficient documentation

## 2020-06-28 DIAGNOSIS — I495 Sick sinus syndrome: Secondary | ICD-10-CM | POA: Diagnosis not present

## 2020-06-28 DIAGNOSIS — Z79899 Other long term (current) drug therapy: Secondary | ICD-10-CM | POA: Insufficient documentation

## 2020-06-28 DIAGNOSIS — R911 Solitary pulmonary nodule: Secondary | ICD-10-CM

## 2020-06-28 DIAGNOSIS — C3431 Malignant neoplasm of lower lobe, right bronchus or lung: Secondary | ICD-10-CM | POA: Insufficient documentation

## 2020-06-28 DIAGNOSIS — J392 Other diseases of pharynx: Secondary | ICD-10-CM | POA: Diagnosis not present

## 2020-06-28 DIAGNOSIS — I110 Hypertensive heart disease with heart failure: Secondary | ICD-10-CM | POA: Insufficient documentation

## 2020-06-28 DIAGNOSIS — J449 Chronic obstructive pulmonary disease, unspecified: Secondary | ICD-10-CM | POA: Insufficient documentation

## 2020-06-28 DIAGNOSIS — I10 Essential (primary) hypertension: Secondary | ICD-10-CM | POA: Insufficient documentation

## 2020-06-28 DIAGNOSIS — I252 Old myocardial infarction: Secondary | ICD-10-CM | POA: Diagnosis not present

## 2020-06-28 DIAGNOSIS — K7460 Unspecified cirrhosis of liver: Secondary | ICD-10-CM | POA: Diagnosis not present

## 2020-06-28 DIAGNOSIS — G4730 Sleep apnea, unspecified: Secondary | ICD-10-CM | POA: Diagnosis not present

## 2020-06-28 NOTE — Progress Notes (Signed)
Hematology/Oncology Consult note [facility] Telephone:([phone] [phone]   Patient Care Team: Idelle Crouch, MD as PCP - General (Internal Medicine) Yolonda Kida, MD as Consulting Physician (Cardiology) Ottie Glazier, MD as Consulting Physician (Pulmonary Disease)  REFERRING PROVIDER: Idelle Crouch, MD  CHIEF COMPLAINTS/REASON FOR VISIT:  Evaluation of lung nodule.  HISTORY OF PRESENTING ILLNESS:   John Frank is a  65 y.o.  male with PMH listed below was seen in consultation at the request of  Idelle Crouch, MD  for evaluation of lung nodule 11/18/2018 bilateral pulmonary nodules including a groundglass attenuating nodule in the right upper lobe measuring 1 cm and a solid nodule in the right lower lobe measuring 1.1 cm in diameter. 04/02/2020, patient had chest without contrast, 1.9 x 1.8 cm spiculated retrohilar right lower lobe pulmonary nodule has increased in size from 10 x 9 mm previously 04/24/2020, PET scan showed 1.9 cm right lower lobe pulmonary nodule is hypermetabolic consistent with primary bronchogenic neoplasm.  No hypermetabolic lymphadenopathy in the right hilum or mediastinum.  There is asymmetric hypermetabolic FDG accumulation in the mucosa of the posterior left nasopharynx without discrete mass lesion or soft tissue asymmetry on CT imaging.  No evidence of of suspicious hypermetabolic disease in the abdomen or pelvis.  Tiny bilateral nodules show no FDG accumulation.  Patient was referred to cancer center lung clinic for evaluation and further management. He lives at home by himself.  Close family member is his sister. Patient follows up with cardiology [facility] for congestive heart failure, sick sinus syndrome with permanent pacemaker, cardiomyopathy AICD in place.    Current everyday smoker, 40 pack year history.    INTERVAL HISTORY John Frank is a 65 y.o. male who has above history reviewed by me  today presents for follow up visit for management of lung mass. Problems and complaints are listed below: During the interval, patient has underwent bronchoscopy biopsy of suspicious lung mass and sampling of lymph node.  He presents to discuss results and management plan.  He has also establish care with radiation oncology.  Patient was accompanied by sister  Review of Systems  Constitutional: Negative for appetite change, chills, fatigue, fever and unexpected weight change.  HENT:   Negative for hearing loss and voice change.   Eyes: Negative for eye problems and icterus.  Respiratory: Positive for cough. Negative for chest tightness and shortness of breath.   Cardiovascular: Negative for chest pain and leg swelling.  Gastrointestinal: Negative for abdominal distention and abdominal pain.  Endocrine: Negative for hot flashes.  Genitourinary: Negative for difficulty urinating, dysuria and frequency.   Musculoskeletal: Negative for arthralgias.  Skin: Negative for itching and rash.  Neurological: Negative for light-headedness and numbness.  Hematological: Negative for adenopathy. Does not bruise/bleed easily.  Psychiatric/Behavioral: Negative for confusion.    MEDICAL HISTORY:  Past Medical History:  Diagnosis Date  . AICD (automatic cardioverter/defibrillator) present   . Angina at rest [facility])   . Aortic atherosclerosis ([place])   . CAD (coronary artery disease)    s/p stents in 2017  . Chronic systolic CHF (congestive heart failure) ([place])   . Cirrhosis of liver ([place])   . COPD (chronic obstructive pulmonary disease) ([place])   . Diabetes ([place])   . GERD (gastroesophageal reflux disease)   . Heart attack ([place])   . Hyperlipidemia   . Hypertension   . Ischemic cardiomyopathy   . Paroxysmal ventricular tachycardia ([place])   . Right lower lobe  lung mass   . Shortness of breath dyspnea   . Sleep apnea     SURGICAL HISTORY: Past Surgical History:  Procedure Laterality Date  . CARDIAC  PACEMAKER PLACEMENT    . cardiac stents    . CARDIAC SURGERY     defibrillator and pacemaker  . CORONARY ANGIOPLASTY    . ESOPHAGOGASTRODUODENOSCOPY N/A 09/19/2014   Procedure: ESOPHAGOGASTRODUODENOSCOPY (EGD);  Surgeon: Lucilla Lame, MD;  Location: [facility] ENDOSCOPY;  Service: Endoscopy;  Laterality: N/A;  . ESOPHAGOGASTRODUODENOSCOPY (EGD) WITH PROPOFOL N/A 09/28/2017   Procedure: ESOPHAGOGASTRODUODENOSCOPY (EGD) WITH PROPOFOL;  Surgeon: Virgel Manifold, MD;  Location: [facility] ENDOSCOPY;  Service: Endoscopy;  Laterality: N/A;  . ESOPHAGOGASTRODUODENOSCOPY (EGD) WITH PROPOFOL N/A 04/19/2018   Procedure: ESOPHAGOGASTRODUODENOSCOPY (EGD) WITH PROPOFOL with Gastric Mapping;  Surgeon: [name], MD;  Location: [facility] ENDOSCOPY;  Service: Gastroenterology;  Laterality: N/A;  . ESOPHAGOGASTRODUODENOSCOPY (EGD) WITH PROPOFOL N/A 12/13/2018   Procedure: ESOPHAGOGASTRODUODENOSCOPY (EGD) WITH PROPOFOL;  Surgeon: [name], MD;  Location: [facility] ENDOSCOPY;  Service: Gastroenterology;  Laterality: N/A;  . ESOPHAGOGASTRODUODENOSCOPY (EGD) WITH PROPOFOL N/A 03/31/2019   Procedure: ESOPHAGOGASTRODUODENOSCOPY (EGD) WITH PROPOFOL;  Surgeon: Lin Landsman, MD;  Location: [facility] ENDOSCOPY;  Service: Gastroenterology;  Laterality: N/A;  . IMPLANTABLE CARDIOVERTER DEFIBRILLATOR (ICD) GENERATOR CHANGE Left 02/21/2020   Procedure: ICD GENERATOR CHANGE;  Surgeon: Isaias Cowman, MD;  Location: [facility] ORS;  Service: Cardiovascular;  Laterality: Left;  Marland Kitchen VIDEO BRONCHOSCOPY WITH ENDOBRONCHIAL NAVIGATION N/A 06/22/2020   Procedure: VIDEO BRONCHOSCOPY WITH ENDOBRONCHIAL NAVIGATION;  Surgeon: Ottie Glazier, MD;  Location: [facility] ORS;  Service: Thoracic;  Laterality: N/A;  . VIDEO BRONCHOSCOPY WITH ENDOBRONCHIAL ULTRASOUND N/A 06/22/2020   Procedure: VIDEO BRONCHOSCOPY WITH ENDOBRONCHIAL ULTRASOUND;  Surgeon: Ottie Glazier, MD;  Location: [facility] ORS;  Service: Thoracic;  Laterality: N/A;    SOCIAL HISTORY: Social History    Socioeconomic History  . Marital status: Single    Spouse name: Not on file  . Number of children: Not on file  . Years of education: Not on file  . Highest education level: Not on file  Occupational History  . Occupation: disabled  Tobacco Use  . Smoking status: Current Every Day Smoker    Packs/day: 0.25    Years: 40.00    Pack years: 10.00    Types: Cigarettes  . Smokeless tobacco: Never Used  Vaping Use  . Vaping Use: Never used  Substance and Sexual Activity  . Alcohol use: No  . Drug use: Never  . Sexual activity: Yes  Other Topics Concern  . Not on file  Social History Narrative   Independent at baseline.  Lives at home with his family   Social Determinants of Health   Financial Resource Strain: Not on file  Food Insecurity: Not on file  Transportation Needs: Not on file  Physical Activity: Not on file  Stress: Not on file  Social Connections: Not on file  Intimate Partner Violence: Not on file    FAMILY HISTORY: Family History  Problem Relation Age of Onset  . Diabetes Mother   . Heart attack Father 75    ALLERGIES:  has No Known Allergies.  MEDICATIONS:  Current Outpatient Medications  Medication Sig Dispense Refill  . aspirin 81 MG EC tablet Take 81 mg by mouth daily.    Marland Kitchen atorvastatin (LIPITOR) 40 MG tablet TAKE 1 TABLET (40 MG TOTAL) BY MOUTH DAILY. (Patient taking differently: Take 40 mg by mouth daily.) 90 tablet 1  . Blood Glucose Monitoring Suppl (ACCU-CHEK GUIDE ME) w/Device KIT     .  budesonide-formoterol (SYMBICORT) 160-4.5 MCG/ACT inhaler Inhale 2 puffs into the lungs 2 (two) times daily.    Marland Kitchen buPROPion (WELLBUTRIN) 75 MG tablet Take 75 mg by mouth daily.    . clopidogrel (PLAVIX) 75 MG tablet TAKE 1 TABLET (75 MG TOTAL) BY MOUTH DAILY. 90 tablet 3  . empagliflozin (JARDIANCE) 10 MG TABS tablet Take 10 mg by mouth daily with breakfast.    . ENTRESTO 49-51 MG TAKE 1 TABLET BY MOUTH TWICE A DAY (Patient taking differently: Take 1 tablet by  mouth 2 (two) times daily.) 60 tablet 5  . furosemide (LASIX) 40 MG tablet Take 40 mg by mouth.    Marland Kitchen glimepiride (AMARYL) 4 MG tablet Take 4 mg by mouth daily with breakfast.    . ipratropium-albuterol (DUONEB) 0.5-2.5 (3) MG/3ML SOLN Take 3 mLs by nebulization in the morning, at noon, and at bedtime.    Marland Kitchen levocetirizine (XYZAL) 5 MG tablet Take 5 mg by mouth every evening.    . metFORMIN (GLUCOPHAGE) 1000 MG tablet TAKE 1 TABLET (1,000 MG TOTAL) BY MOUTH 2 (TWO) TIMES DAILY WITH A MEAL. (Patient taking differently: Take 1,000 mg by mouth in the morning and at bedtime.) 180 tablet 1  . metoprolol succinate (TOPROL-XL) 100 MG 24 hr tablet Take 1 tablet (100 mg total) by mouth daily. 90 tablet 3  . ondansetron (ZOFRAN ODT) 4 MG disintegrating tablet Take 1 tablet (4 mg total) by mouth every 8 (eight) hours as needed. 20 tablet 0  . ONE TOUCH ULTRA TEST test strip USE TO TEST BLOOD SUGAR ONCE DAILY 100 each 3  . pantoprazole (PROTONIX) 40 MG tablet Take 1 tablet (40 mg total) by mouth 2 (two) times daily before a meal. 180 tablet 1  . Propylene Glycol (SYSTANE BALANCE) 0.6 % SOLN Apply to eye.    . spironolactone (ALDACTONE) 25 MG tablet TAKE 1/2 TABLET BY MOUTH EVERY DAY (Patient taking differently: Take 12.5 mg by mouth daily.) 45 tablet 3  . sucralfate (CARAFATE) 1 g tablet Take 1 g by mouth 3 (three) times daily before meals.    . TRULICITY 1.5 ZS/0.1UX SOPN Inject 0.5 mg into the skin every Sunday.     No current facility-administered medications for this visit.     PHYSICAL EXAMINATION: ECOG PERFORMANCE STATUS: 1 - Symptomatic but completely ambulatory Vitals:   06/28/20 1146  BP: 93/72  Pulse: 97  Resp: 18  Temp: 97.7 F (36.5 C)   Filed Weights   06/28/20 1146  Weight: 137 lb 13 oz (62.5 kg)    Physical Exam Constitutional:      General: He is not in acute distress. HENT:     Head: Normocephalic and atraumatic.  Eyes:     General: No scleral icterus. Cardiovascular:      Rate and Rhythm: Normal rate and regular rhythm.     Heart sounds: Normal heart sounds.  Pulmonary:     Effort: Pulmonary effort is normal. No respiratory distress.     Breath sounds: Wheezing present.     Comments: Wheezing bilaterally.  Abdominal:     General: Bowel sounds are normal. There is no distension.     Palpations: Abdomen is soft.  Musculoskeletal:        General: No deformity. Normal range of motion.     Cervical back: Normal range of motion and neck supple.  Skin:    General: Skin is warm and dry.     Findings: No erythema or rash.  Neurological:  Mental Status: He is alert and oriented to person, place, and time. Mental status is at baseline.     Cranial Nerves: No cranial nerve deficit.     Coordination: Coordination normal.  Psychiatric:        Mood and Affect: Mood normal.     LABORATORY DATA:  I have reviewed the data as listed Lab Results  Component Value Date   WBC 5.6 05/25/2020   HGB 11.9 (L) 05/25/2020   HCT 35.9 (L) 05/25/2020   MCV 90.2 05/25/2020   PLT 209 05/25/2020   Recent Labs    09/06/19 2152 01/04/20 1124 01/15/20 1523 02/21/20 1126 05/25/20 1000  NA 139 132* 135 130* 130*  K 4.3 4.8 3.8 4.7 3.6  CL 100 95* 100 93* 95*  CO2 _0 GLUCOSE 142* 496* 397* 476* 158*  BUN _1 CREATININE 1.04 0.79 0.62 0.85 0.61  CALCIUM 9.5 9.3 9.1 9.9 8.7*  GFRNONAA >60 >60 >60 >60 >60  GFRAA >60 >60 >60  --   --   PROT 8.0  --   --   --  6.9  ALBUMIN 4.5  --   --   --  3.6  AST 28  --   --   --  27  ALT 27  --   --   --  21  ALKPHOS 139*  --   --   --  84  BILITOT 1.4*  --   --   --  0.4   Iron/TIBC/Ferritin/ %Sat    Component Value Date/Time   IRON 30 (L) 08/03/2017 1442   TIBC 398 08/03/2017 1442   FERRITIN 34 08/03/2017 1442   IRONPCTSAT 8 (L) 08/03/2017 1442      RADIOGRAPHIC STUDIES: I have personally reviewed the radiological images as listed and agreed with the findings in the report. CT CHEST WO  CONTRAST  Result Date: 04/02/2020 CLINICAL DATA:  Pulmonary nodules.  Skip EXAM: CT CHEST WITHOUT CONTRAST TECHNIQUE: Multidetector CT imaging of the chest was performed following the standard protocol without IV contrast. COMPARISON:  Abdomen pelvis CT 01/25/2019.  Chest CT 11/18/2018. FINDINGS: Cardiovascular: The heart size is normal. No substantial pericardial effusion. Coronary artery calcification is evident. Atherosclerotic calcification is noted in the wall of the thoracic aorta. Left permanent pacemaker. Mediastinum/Nodes: No mediastinal lymphadenopathy. Upper normal 9 mm short axis subcarinal node is stable. No evidence for gross hilar lymphadenopathy although assessment is limited by the lack of intravenous contrast on today's study. The esophagus has normal imaging features. There is no axillary lymphadenopathy. Lungs/Pleura: Centrilobular emphsyema noted. Biapical pleuroparenchymal scarring evident. 1.9 x 1.8 cm spiculated retro hilar right lower lobe pulmonary nodule (image 80/series 4) has increased in size from 10 x 9 mm previously (remeasured). This is highly suspicious for primary bronchogenic neoplasm. Lobular cranial contour of this lesion is well demonstrated on coronal image 78/5. 4 mm peripheral right upper lobe nodule on 56/4 is stable. 4 mm right perifissural nodule on 76/4 is unchanged. Calcified granulomata in the posterior right lower lobe on 98/4 are similar to prior. Tiny subpleural nodules posterior left upper lobe on image 33 and image 39 of series 4 are stable. Branching left upper lobe pulmonary nodule on 42/4 is compatible with impacted airway, stable. Additional scattered parenchymal and subpleural tiny nodules are noted. No pleural effusion. Upper Abdomen: Unremarkable. Musculoskeletal: No worrisome lytic or sclerotic osseous abnormality. IMPRESSION: 1. 1.9 x 1.8 cm spiculated retro hilar right lower  lobe pulmonary nodule has increased in size from 10 x 9 mm previously. This  is highly suspicious for primary bronchogenic neoplasm. PET-CT recommended to further evaluate. 2. Additional tiny bilateral pulmonary nodules are stable in the interval since chest CT 11/18/2018. 3. Aortic Atherosclerosis (ICD10-I70.0) and Emphysema (ICD10-J43.9). These results will be called to the ordering clinician or representative by the Radiologist Assistant, and communication documented in the PACS or [employer]. Electronically Signed   By: Misty Stanley M.D.   On: 04/02/2020 15:04   NM PET Image Initial (PI) Skull Base To Thigh  Result Date: 04/24/2020 CLINICAL DATA:  Initial treatment strategy for pulmonary nodule. EXAM: NUCLEAR MEDICINE PET SKULL BASE TO THIGH TECHNIQUE: 7.2 mCi F-18 FDG was injected intravenously. Full-ring PET imaging was performed from the skull base to thigh after the radiotracer. CT data was obtained and used for attenuation correction and anatomic localization. Fasting blood glucose: 117 mg/dl COMPARISON:  Chest CT 04/02/2020 FINDINGS: Mediastinal blood pool activity: SUV max 1.6 Liver activity: SUV max NA NECK: Asymmetric uptake identified posterior left nasopharynx in the region of the fossa of [place]er. SUV max = 3.1. No soft tissue asymmetry or mass lesion evident on noncontrast CT imaging today. Incidental CT findings: none CHEST: Spiculated 1.9 cm retro hilar right lower lobe nodule of concern on previous diagnostic chest CT is hypermetabolic with SUV max = 5.1. No evidence for hypermetabolic lymphadenopathy in the right hilum or mediastinum. No additional sites of unexpected or suspicious hypermetabolism in the thorax. Incidental CT findings: Centrilobular emphsyema noted. Scattered tiny bilateral pulmonary nodules identified on the previous CT scan appear stable today. No pleural effusion. ABDOMEN/PELVIS: No abnormal hypermetabolic activity within the liver, pancreas, adrenal glands, or spleen. No hypermetabolic lymph nodes in the abdomen or pelvis. Incidental  CT findings: There is abdominal aortic atherosclerosis without aneurysm. Small bilateral groin hernias contain only fat. SKELETON: No focal hypermetabolic activity to suggest skeletal metastasis. Incidental CT findings: No worrisome lytic or sclerotic osseous abnormality. IMPRESSION: 1. 1.9 cm right lower lobe pulmonary nodule is hypermetabolic consistent with primary bronchogenic neoplasm. No hypermetabolic lymphadenopathy in the right hilum or mediastinum. 2. Asymmetric hypermetabolic FDG accumulation in the mucosa of the posterior left nasopharynx without discrete mass lesion or soft tissue asymmetry on CT imaging. Direct visualization recommended to exclude mucosal neoplasm. 3. No evidence for unexpected or suspicious hypermetabolic disease in the abdomen/pelvis. 4. Tiny bilateral pulmonary nodules show no FDG accumulation but are below accepted size threshold for reliable resolution on PET imaging. 5.  Aortic Atherosclerois (ICD10-170.0) Emphysema. ([id number]) Electronically Signed   By: Misty Stanley M.D.   On: 04/24/2020 12:07   DG C-Arm 1-60 Min-No Report  Result Date: 06/22/2020 Fluoroscopy was utilized by the requesting physician.  No radiographic interpretation.   CT Super D Chest Wo Contrast  Result Date: 06/22/2020 CLINICAL DATA:  Lung mass on PET, pre bronchoscopy EXAM: CT CHEST WITHOUT CONTRAST TECHNIQUE: Multidetector CT imaging of the chest was performed using thin slice collimation for electromagnetic bronchoscopy planning purposes, without intravenous contrast. COMPARISON:  PET-CT dated 04/24/2020 FINDINGS: Cardiovascular: Heart is normal in size.  No pericardial effusion. No evidence of thoracic aortic aneurysm. Atherosclerotic calcifications of the aortic arch. Three vessel coronary atherosclerosis. Left subclavian ICD. Mediastinum/Nodes: Mediastinal lymphadenopathy, including 11 mm short axis low right paratracheal node (series 2/image 26) and a 17 mm short axis subcarinal node  (series 2/image 32), progressive. Suspected right perihilar lymphadenopathy, although poorly evaluated on unenhanced CT. Visualized thyroid is unremarkable. Lungs/Pleura: Mild biapical pleural-parenchymal  scarring Mild centrilobular and paraseptal emphysematous changes in the bilateral upper lobes. Mild peribronchovascular nodularity in the bilateral upper lobes, nonspecific but favoring mild infection. Additional scattered subpleural nodularity in the lungs bilaterally. 2.3 x 2.1 cm spiculated nodule in the medial right lower lobe, previously 1.9 x 1.8 cm, with associated pleural retraction along the posteromedial right hemithorax (series 3/image 37). Trace right pleural effusion, new. Mild patchy opacity in the anterior right lower lobe (series 3/image 51), progressive, nonspecific. No pneumothorax. Upper Abdomen: Visualized upper abdomen is notable for mild perisplenic calcifications and mild vascular calcifications. Musculoskeletal: Degenerative changes of the visualized thoracolumbar spine. IMPRESSION: 2.3 cm spiculated nodule in the medial right lower lobe, corresponding to the patient's suspected primary bronchogenic neoplasm, increased. Associated mediastinal and right perihilar lymphadenopathy, progressive, suspicious for nodal metastases. Associated involvement of the posteromedial pleura along the right lower lobe. Trace right pleural effusion, new. Mild patchy opacity in the anterior right lower lobe, nonspecific. Additional mild peribronchovascular nodularity predominantly in the bilateral upper lobes, favoring mild infection. Aortic Atherosclerosis (ICD10-I70.0) and Emphysema (ICD10-J43.9). Electronically Signed   By: Julian Hy M.D.   On: 06/22/2020 16:51      ASSESSMENT & PLAN:  1. Lung mass   2. Chronic obstructive pulmonary disease, unspecified COPD type ([place])   3. Disorder of nasopharynx    #Lung mass CT images and PET scan images were independently reviewed by me and discussed  with patient. 06/22/2020 patient had CT super D chest without contrast which again showed a suspicious spiculated nodule in the medial right lower lobe, increased in size with now 2.3 x 2.1 comparing to 1.9 x 1.8 less than 2 months ago. Right lower lobe lung nodule bronchoscopy showed nondiagnostic sample. Station 10 R, seven, 10 L, negative for malignancy.  Right lower lobe BAL nondiagnostic Given patient's severe COPD and high risk with repeat biopsy procedures, Dr. Baruch Gouty has recommended empiric SBRT.  Patient agrees with the plan.  I'll defer to radiation oncology for further surveillance of patient's condition.  #Patient has asymmetric hypermetabolic FDG accumulation in the mucosa of the posterior left nasopharynx without discrete mass or soft tissue asymmetry on CT.  I recommend patient establish care with ENT for direct visualization. All questions were answered. The patient knows to call the clinic with any problems questions or concerns.  cc Idelle Crouch, MD   Thank you for this kind referral and the opportunity to participate in the care of this patient. A copy of today's note is routed to referring provider    Earlie Server, MD, PhD Hematology Oncology [facility] at [facility] Pager- [phone] 06/28/2020

## 2020-06-28 NOTE — Progress Notes (Signed)
  Oncology Nurse Navigator Documentation  Navigator Location: [facility]Med Onc (06/28/20 1400)   )Navigator Encounter Type: Follow-up Appt (06/28/20 1400)                     Patient Visit Type: MedOnc;RadOnc (06/28/20 1400) Treatment Phase: Pre-Tx/Tx Discussion (06/28/20 1400) Barriers/Navigation Needs: Coordination of Care (06/28/20 1400)   Interventions: Coordination of Care (06/28/20 1400)   Coordination of Care: Appts (06/28/20 1400)       met with patient and his sister during follow up visit with Dr. Tasia Catchings and initial consult with Dr. Baruch Gouty. All questions answered during visit. Reviewed upcoming appts with pt and his sister. Nothing further needed at this time. Instructed to call with any further questions or needs. Pt and his sister verbalized understanding.           Time Spent with Patient: 90 (06/28/20 1400)

## 2020-06-28 NOTE — Consult Note (Signed)
NEW PATIENT EVALUATION  Name: John Frank  MRN: [id number]  Date:   06/28/2020     DOB: 11/12/1955   This 65 y.o. male patient presents to the clinic for initial evaluation of presumed stage I right lower lobe non-small cell lung cancer.  REFERRING PHYSICIAN: Idelle Crouch, MD  CHIEF COMPLAINT:  Chief Complaint  Patient presents with  . Lung Cancer    Initial consultation    DIAGNOSIS: The encounter diagnosis was Lung nodule.   PREVIOUS INVESTIGATIONS:  Serial CT scans and PET CT scans reviewed Pathology report reviewed Clinical notes reviewed  HPI: Patient is a 65 year old male who presented with increasing right lower lobe spiculated nodule back in November measuring 1.1 cm which has grown to 1.9 cm in the retrohilar right lower lobe.  PET CT scan demonstrated a 1.9 cm right lower lobe primary nodule hypermetabolic consistent with primary bronchogenic carcinoma.  No hypermetabolic lymphadenopathy in the right hilum or mediastinum was noted.  Patient underwent bronchoscopy which was negative for malignancy.  His most recent CT scan this month showed a 2.3 spiculated nodule medial right lower lobe corresponding to suspect a primary Brunick bronchogenic carcinoma.  He did have some mediastinal and right perihilar lymphadenopathy although this was not present on his prior scan and was not hypermetabolic at that time may be related to infection.  Those areas were all evaluated by bronchoscopy.  He is seen today for evaluation he is accompanied by his sister.  He does have a nonproductive cough has been losing some weight although recently has gained weight significantly which may be related to his congestive heart failure.  He has significant comorbidities.  PLANNED TREATMENT REGIMEN: SBRT to right lower lobe  PAST MEDICAL HISTORY:  has a past medical history of AICD (automatic cardioverter/defibrillator) present, Angina at rest [facility]), Aortic atherosclerosis ([place]), CAD (coronary  artery disease), Chronic systolic CHF (congestive heart failure) ([place]), Cirrhosis of liver ([place]), COPD (chronic obstructive pulmonary disease) ([place]), Diabetes ([place]), GERD (gastroesophageal reflux disease), Heart attack ([place]), Hyperlipidemia, Hypertension, Ischemic cardiomyopathy, Paroxysmal ventricular tachycardia ([place]), Right lower lobe lung mass, Shortness of breath dyspnea, and Sleep apnea.    PAST SURGICAL HISTORY:  Past Surgical History:  Procedure Laterality Date  . CARDIAC PACEMAKER PLACEMENT    . cardiac stents    . CARDIAC SURGERY     defibrillator and pacemaker  . CORONARY ANGIOPLASTY    . ESOPHAGOGASTRODUODENOSCOPY N/A 09/19/2014   Procedure: ESOPHAGOGASTRODUODENOSCOPY (EGD);  Surgeon: Lucilla Lame, MD;  Location: [facility] ENDOSCOPY;  Service: Endoscopy;  Laterality: N/A;  . ESOPHAGOGASTRODUODENOSCOPY (EGD) WITH PROPOFOL N/A 09/28/2017   Procedure: ESOPHAGOGASTRODUODENOSCOPY (EGD) WITH PROPOFOL;  Surgeon: Virgel Manifold, MD;  Location: [facility] ENDOSCOPY;  Service: Endoscopy;  Laterality: N/A;  . ESOPHAGOGASTRODUODENOSCOPY (EGD) WITH PROPOFOL N/A 04/19/2018   Procedure: ESOPHAGOGASTRODUODENOSCOPY (EGD) WITH PROPOFOL with Gastric Mapping;  Surgeon: [name], MD;  Location: [facility] ENDOSCOPY;  Service: Gastroenterology;  Laterality: N/A;  . ESOPHAGOGASTRODUODENOSCOPY (EGD) WITH PROPOFOL N/A 12/13/2018   Procedure: ESOPHAGOGASTRODUODENOSCOPY (EGD) WITH PROPOFOL;  Surgeon: [name], MD;  Location: [facility] ENDOSCOPY;  Service: Gastroenterology;  Laterality: N/A;  . ESOPHAGOGASTRODUODENOSCOPY (EGD) WITH PROPOFOL N/A 03/31/2019   Procedure: ESOPHAGOGASTRODUODENOSCOPY (EGD) WITH PROPOFOL;  Surgeon: Lin Landsman, MD;  Location: [facility] ENDOSCOPY;  Service: Gastroenterology;  Laterality: N/A;  . IMPLANTABLE CARDIOVERTER DEFIBRILLATOR (ICD) GENERATOR CHANGE Left 02/21/2020   Procedure: ICD GENERATOR CHANGE;  Surgeon: Isaias Cowman, MD;  Location: [facility] ORS;  Service: Cardiovascular;   Laterality: Left;  Marland Kitchen VIDEO BRONCHOSCOPY WITH ENDOBRONCHIAL NAVIGATION  N/A 06/22/2020   Procedure: VIDEO BRONCHOSCOPY WITH ENDOBRONCHIAL NAVIGATION;  Surgeon: Ottie Glazier, MD;  Location: [facility] ORS;  Service: Thoracic;  Laterality: N/A;  . VIDEO BRONCHOSCOPY WITH ENDOBRONCHIAL ULTRASOUND N/A 06/22/2020   Procedure: VIDEO BRONCHOSCOPY WITH ENDOBRONCHIAL ULTRASOUND;  Surgeon: Ottie Glazier, MD;  Location: [facility] ORS;  Service: Thoracic;  Laterality: N/A;    FAMILY HISTORY: family history includes Diabetes in his mother; Heart attack (age of onset: 80) in his father.  SOCIAL HISTORY:  reports that he has been smoking cigarettes. He has a 10.00 pack-year smoking history. He has never used smokeless tobacco. He reports that he does not drink alcohol and does not use drugs.  ALLERGIES: Patient has no known allergies.  MEDICATIONS:  Current Outpatient Medications  Medication Sig Dispense Refill  . aspirin 81 MG EC tablet Take 81 mg by mouth daily.    Marland Kitchen atorvastatin (LIPITOR) 40 MG tablet TAKE 1 TABLET (40 MG TOTAL) BY MOUTH DAILY. (Patient taking differently: Take 40 mg by mouth daily.) 90 tablet 1  . Blood Glucose Monitoring Suppl (ACCU-CHEK GUIDE ME) w/Device KIT     . budesonide-formoterol (SYMBICORT) 160-4.5 MCG/ACT inhaler Inhale 2 puffs into the lungs 2 (two) times daily.    Marland Kitchen buPROPion (WELLBUTRIN) 75 MG tablet Take 75 mg by mouth daily.    . clopidogrel (PLAVIX) 75 MG tablet TAKE 1 TABLET (75 MG TOTAL) BY MOUTH DAILY. 90 tablet 3  . empagliflozin (JARDIANCE) 10 MG TABS tablet Take 10 mg by mouth daily with breakfast.    . ENTRESTO 49-51 MG TAKE 1 TABLET BY MOUTH TWICE A DAY (Patient taking differently: Take 1 tablet by mouth 2 (two) times daily.) 60 tablet 5  . furosemide (LASIX) 40 MG tablet Take 40 mg by mouth.    Marland Kitchen glimepiride (AMARYL) 4 MG tablet Take 4 mg by mouth daily with breakfast.    . ipratropium-albuterol (DUONEB) 0.5-2.5 (3) MG/3ML SOLN Take 3 mLs by nebulization in the  morning, at noon, and at bedtime.    Marland Kitchen levocetirizine (XYZAL) 5 MG tablet Take 5 mg by mouth every evening.    . metFORMIN (GLUCOPHAGE) 1000 MG tablet TAKE 1 TABLET (1,000 MG TOTAL) BY MOUTH 2 (TWO) TIMES DAILY WITH A MEAL. (Patient taking differently: Take 1,000 mg by mouth in the morning and at bedtime.) 180 tablet 1  . metoprolol succinate (TOPROL-XL) 100 MG 24 hr tablet Take 1 tablet (100 mg total) by mouth daily. 90 tablet 3  . ondansetron (ZOFRAN ODT) 4 MG disintegrating tablet Take 1 tablet (4 mg total) by mouth every 8 (eight) hours as needed. 20 tablet 0  . ONE TOUCH ULTRA TEST test strip USE TO TEST BLOOD SUGAR ONCE DAILY 100 each 3  . pantoprazole (PROTONIX) 40 MG tablet Take 1 tablet (40 mg total) by mouth 2 (two) times daily before a meal. 180 tablet 1  . Propylene Glycol (SYSTANE BALANCE) 0.6 % SOLN Apply to eye.    . spironolactone (ALDACTONE) 25 MG tablet TAKE 1/2 TABLET BY MOUTH EVERY DAY (Patient taking differently: Take 12.5 mg by mouth daily.) 45 tablet 3  . sucralfate (CARAFATE) 1 g tablet Take 1 g by mouth 3 (three) times daily before meals.    . TRULICITY 1.5 KW/4.0XB SOPN Inject 0.5 mg into the skin every Sunday.     No current facility-administered medications for this encounter.    ECOG PERFORMANCE STATUS:  0 - Asymptomatic  REVIEW OF SYSTEMS: Patient denies any weight loss, fatigue, weakness, fever, chills or night  sweats. Patient denies any loss of vision, blurred vision. Patient denies any ringing  of the ears or hearing loss. No irregular heartbeat. Patient denies heart murmur or history of fainting. Patient denies any chest pain or pain radiating to her upper extremities. Patient denies any shortness of breath, difficulty breathing at night, cough or hemoptysis. Patient denies any swelling in the lower legs. Patient denies any nausea vomiting, vomiting of blood, or coffee ground material in the vomitus. Patient denies any stomach pain. Patient states has had normal  bowel movements no significant constipation or diarrhea. Patient denies any dysuria, hematuria or significant nocturia. Patient denies any problems walking, swelling in the joints or loss of balance. Patient denies any skin changes, loss of hair or loss of weight. Patient denies any excessive worrying or anxiety or significant depression. Patient denies any problems with insomnia. Patient denies excessive thirst, polyuria, polydipsia. Patient denies any swollen glands, patient denies easy bruising or easy bleeding. Patient denies any recent infections, allergies or URI. Patient "s visual fields have not changed significantly in recent time.   PHYSICAL EXAM: BP (P) 93/72 (BP Location: Left Arm, Patient Position: Sitting)   Pulse (P) 97   Resp (P) 20   Wt (P) 137 lb 12.8 oz (62.5 kg)   BMI (P) 23.65 kg/m  Well-developed well-nourished patient in NAD. HEENT reveals PERLA, EOMI, discs not visualized.  Oral cavity is clear. No oral mucosal lesions are identified. Neck is clear without evidence of cervical or supraclavicular adenopathy. Lungs are clear to A&P. Cardiac examination is essentially unremarkable with regular rate and rhythm without murmur rub or thrill. Abdomen is benign with no organomegaly or masses noted. Motor sensory and DTR levels are equal and symmetric in the upper and lower extremities. Cranial nerves II through XII are grossly intact. Proprioception is intact. No peripheral adenopathy or edema is identified. No motor or sensory levels are noted. Crude visual fields are within normal range.  LABORATORY DATA: Cytology and surgical pathology report reviewed compatible with above-stated findings    RADIOLOGY RESULTS: Serial CT scans and PET CT scans reviewed compatible with above-stated findings   IMPRESSION: Stage I non-small cell lung cancer of the right lower lobe in 65 year old male with multiple medical comorbidities  PLAN: At this time I I have recommended SBRT 60 Gray in 5  fractions to the right lower lobe lesion.  I will discount the possibility that there is mediastinal involved disease since his recent PET CT scan was negative and these lymph nodes have grown significantly consistent with an inflammatory process.  Risks and benefits of SBRT including possible increased cough fatigue all were discussed in detail with the patient and his sister.  They have both agreed to go ahead with treatment.  I have personally personally set up and ordered CT simulation with 4-dimensional treatment planning and motion restriction.  Patient and sister both comprehend my treatment plan well.  I would like to take this opportunity to thank you for allowing me to participate in the care of your patient.Noreene Filbert, MD

## 2020-06-30 ENCOUNTER — Emergency Department: Payer: Medicare Other

## 2020-06-30 ENCOUNTER — Emergency Department
Admission: EM | Admit: 2020-06-30 | Discharge: 2020-07-01 | Disposition: A | Discharge status: Home / Self Care | Payer: Medicare Other | Attending: Emergency Medicine | Admitting: Emergency Medicine

## 2020-06-30 ENCOUNTER — Other Ambulatory Visit: Payer: Self-pay

## 2020-06-30 DIAGNOSIS — E119 Type 2 diabetes mellitus without complications: Secondary | ICD-10-CM | POA: Insufficient documentation

## 2020-06-30 DIAGNOSIS — I110 Hypertensive heart disease with heart failure: Secondary | ICD-10-CM | POA: Diagnosis not present

## 2020-06-30 DIAGNOSIS — Z7982 Long term (current) use of aspirin: Secondary | ICD-10-CM | POA: Diagnosis not present

## 2020-06-30 DIAGNOSIS — Z7984 Long term (current) use of oral hypoglycemic drugs: Secondary | ICD-10-CM | POA: Diagnosis not present

## 2020-06-30 DIAGNOSIS — Z79899 Other long term (current) drug therapy: Secondary | ICD-10-CM | POA: Insufficient documentation

## 2020-06-30 DIAGNOSIS — J441 Chronic obstructive pulmonary disease with (acute) exacerbation: Secondary | ICD-10-CM | POA: Insufficient documentation

## 2020-06-30 DIAGNOSIS — F17210 Nicotine dependence, cigarettes, uncomplicated: Secondary | ICD-10-CM | POA: Diagnosis not present

## 2020-06-30 DIAGNOSIS — I2510 Atherosclerotic heart disease of native coronary artery without angina pectoris: Secondary | ICD-10-CM | POA: Diagnosis not present

## 2020-06-30 DIAGNOSIS — Z20822 Contact with and (suspected) exposure to covid-19: Secondary | ICD-10-CM | POA: Diagnosis not present

## 2020-06-30 DIAGNOSIS — Z950 Presence of cardiac pacemaker: Secondary | ICD-10-CM | POA: Diagnosis not present

## 2020-06-30 DIAGNOSIS — Z7902 Long term (current) use of antithrombotics/antiplatelets: Secondary | ICD-10-CM | POA: Diagnosis not present

## 2020-06-30 DIAGNOSIS — R109 Unspecified abdominal pain: Secondary | ICD-10-CM | POA: Diagnosis present

## 2020-06-30 DIAGNOSIS — J188 Other pneumonia, unspecified organism: Secondary | ICD-10-CM | POA: Insufficient documentation

## 2020-06-30 DIAGNOSIS — R079 Chest pain, unspecified: Secondary | ICD-10-CM

## 2020-06-30 DIAGNOSIS — J189 Pneumonia, unspecified organism: Secondary | ICD-10-CM

## 2020-06-30 DIAGNOSIS — I5022 Chronic systolic (congestive) heart failure: Secondary | ICD-10-CM | POA: Insufficient documentation

## 2020-06-30 LAB — COMPREHENSIVE METABOLIC PANEL
ALT: 23 U/L (ref 0–44)
AST: 21 U/L (ref 15–41)
Albumin: 3.9 g/dL (ref 3.5–5.0)
Alkaline Phosphatase: 101 U/L (ref 38–126)
Anion gap: 13 (ref 5–15)
BUN: 20 mg/dL (ref 8–23)
CO2: 26 mmol/L (ref 22–32)
Calcium: 8.9 mg/dL (ref 8.9–10.3)
Chloride: 97 mmol/L — ABNORMAL LOW (ref 98–111)
Creatinine, Ser: 0.9 mg/dL (ref 0.61–1.24)
GFR, Estimated: >60 mL/min (ref >60)
Glucose, Bld: 230 mg/dL — ABNORMAL HIGH (ref 70–99)
Potassium: 3.6 mmol/L (ref 3.5–5.1)
Sodium: 136 mmol/L (ref 135–145)
Total Bilirubin: 0.8 mg/dL (ref 0.3–1.2)
Total Protein: 7.5 g/dL (ref 6.5–8.1)

## 2020-06-30 LAB — URINALYSIS, ROUTINE W REFLEX MICROSCOPIC
Bacteria, UA: NONE SEEN
Bilirubin Urine: NEGATIVE
Glucose, UA: >=500 mg/dL — AB
Hgb urine dipstick: NEGATIVE
Ketones, ur: NEGATIVE mg/dL
Leukocytes,Ua: NEGATIVE
Nitrite: NEGATIVE
Protein, ur: NEGATIVE mg/dL
Specific Gravity, Urine: 1.023 (ref 1.005–1.030)
Squamous Epithelial / HPF: NONE SEEN (ref 0–5)
pH: 6 (ref 5.0–8.0)

## 2020-06-30 LAB — CBC WITH DIFFERENTIAL/PLATELET
Abs Immature Granulocytes: 0.08 10*3/uL — ABNORMAL HIGH (ref 0.00–0.07)
Basophils Absolute: 0.1 10*3/uL (ref 0.0–0.1)
Basophils Relative: 1 %
Eosinophils Absolute: 0.2 10*3/uL (ref 0.0–0.5)
Eosinophils Relative: 2 %
HCT: 40.4 % (ref 39.0–52.0)
Hemoglobin: 13.2 g/dL (ref 13.0–17.0)
Immature Granulocytes: 1 %
Lymphocytes Relative: 19 %
Lymphs Abs: 2.4 10*3/uL (ref 0.7–4.0)
MCH: 29.4 pg (ref 26.0–34.0)
MCHC: 32.7 g/dL (ref 30.0–36.0)
MCV: 90 fL (ref 80.0–100.0)
Monocytes Absolute: 0.9 10*3/uL (ref 0.1–1.0)
Monocytes Relative: 7 %
Neutro Abs: 8.8 10*3/uL — ABNORMAL HIGH (ref 1.7–7.7)
Neutrophils Relative %: 70 %
Platelets: 369 10*3/uL (ref 150–400)
RBC: 4.49 MIL/uL (ref 4.22–5.81)
RDW: 17.8 % — ABNORMAL HIGH (ref 11.5–15.5)
WBC: 12.4 10*3/uL — ABNORMAL HIGH (ref 4.0–10.5)
nRBC: 0 % (ref 0.0–0.2)

## 2020-06-30 LAB — LIPASE, BLOOD: Lipase: 27 U/L (ref 11–51)

## 2020-06-30 LAB — TROPONIN I (HIGH SENSITIVITY): Troponin I (High Sensitivity): 10 ng/L (ref <18)

## 2020-06-30 MED ORDER — IOHEXOL 350 MG/ML SOLN
100.0000 mL | Freq: Once | INTRAVENOUS | Status: AC | PRN
  Administered 2020-06-30: 85 mL via INTRAVENOUS

## 2020-06-30 MED ORDER — MORPHINE SULFATE (PF) 4 MG/ML IV SOLN
4.0000 mg | Freq: Once | INTRAVENOUS | Status: AC
  Administered 2020-06-30: 4 mg via INTRAVENOUS
  Filled 2020-06-30: qty 1

## 2020-06-30 MED ORDER — AMOXICILLIN-POT CLAVULANATE 875-125 MG PO TABS
1.0000 | ORAL_TABLET | Freq: Once | ORAL | Status: AC
  Administered 2020-07-01: 1 via ORAL
  Filled 2020-06-30: qty 1

## 2020-06-30 NOTE — ED Notes (Signed)
First rn note: per ems pt with left sided flank pain 6/10, sharp and intermittent for 2 days. Vitals 113/58, 88, 97% ra. cbg 361. History of DM, COPD per ems. Non tender to palpation per ems.

## 2020-06-30 NOTE — ED Notes (Signed)
Pt c/o sharp, luq abdominal pain x 1 day. Denies n/v/d, constipation, or urinary symptoms. Pt sts pain is intermittent and nonradiating.

## 2020-06-30 NOTE — ED Provider Notes (Signed)
[facility] Emergency Department Provider Note   ____________________________________________   Event Date/Time   First MD Initiated Contact with Patient 06/30/20 2104     (approximate)  I have reviewed the triage vital signs and the nursing notes.   HISTORY  Chief Complaint Abdominal Pain    HPI John Frank is a 65 y.o. male with past medical history of hypertension, hyperlipidemia, diabetes, COPD, CAD, CHF, and cirrhosis who presents to the ED complaining of flank pain.  Patient reports that he has been dealing with constant pain in the area of his left flank for the past 2 days, gradually worsening in severity.  He describes it as sharp and worse when he goes to take a deep breath, is located just under his left lower rib cage.  He denies any associated fevers, cough, shortness of breath, or pain over his anterior chest.  He has not had any abdominal pain and denies any nausea, vomiting, dysuria, hematuria, or changes in bowel movements.  He was recently diagnosed with a right lower lung mass concerning for malignancy, has yet to start treatment.  He denies any pain or swelling in his legs, but he was seen by his cardiologist yesterday and found to have gained weight, subsequently put on a new diuretic.        Past Medical History:  Diagnosis Date   AICD (automatic cardioverter/defibrillator) present    Angina at rest [facility])    Aortic atherosclerosis ([place])    CAD (coronary artery disease)    s/p stents in 0881   Chronic systolic CHF (congestive heart failure) (HCC)    Cirrhosis of liver (HCC)    COPD (chronic obstructive pulmonary disease) (HCC)    Diabetes (HCC)    GERD (gastroesophageal reflux disease)    Heart attack ([place])    Hyperlipidemia    Hypertension    Ischemic cardiomyopathy    Paroxysmal ventricular tachycardia (HCC)    Right lower lobe lung mass    Shortness of breath dyspnea    Sleep apnea     Patient Active  Problem List   Diagnosis Date Noted   Pain due to onychomycosis of toenails of both feet 03/15/2020   Ulcer of esophagus without bleeding    Gastritis with intestinal metaplasia of stomach    COPD with acute exacerbation ([place]) 05/25/2018   Iron deficiency anemia    Cirrhosis of liver with ascites ([place])    Chronic systolic heart failure ([place]) 08/31/2017   CHF exacerbation ([place]) 08/09/2017   Syncope 01/11/2016   Tobacco abuse 04/06/2015   HTN (hypertension) 11/03/2014   Sleep apnea 10/26/2014   CAD (coronary artery disease) 10/26/2014   GERD (gastroesophageal reflux disease) 10/26/2014   Polyp of colon 10/26/2014   Diabetes mellitus without complication ([place]) 03/11/5944   Hyperlipidemia 10/26/2014   COPD, severe ([place]) 10/26/2014   Status cardiac pacemaker 10/26/2014   Lesion of nasal septum 10/26/2014    Past Surgical History:  Procedure Laterality Date   CARDIAC PACEMAKER PLACEMENT     cardiac stents     CARDIAC SURGERY     defibrillator and pacemaker   CORONARY ANGIOPLASTY     ESOPHAGOGASTRODUODENOSCOPY N/A 09/19/2014   Procedure: ESOPHAGOGASTRODUODENOSCOPY (EGD);  Surgeon: Lucilla Lame, MD;  Location: [facility] ENDOSCOPY;  Service: Endoscopy;  Laterality: N/A;   ESOPHAGOGASTRODUODENOSCOPY (EGD) WITH PROPOFOL N/A 09/28/2017   Procedure: ESOPHAGOGASTRODUODENOSCOPY (EGD) WITH PROPOFOL;  Surgeon: Virgel Manifold, MD;  Location: [facility] ENDOSCOPY;  Service: Endoscopy;  Laterality: N/A;   ESOPHAGOGASTRODUODENOSCOPY (  EGD) WITH PROPOFOL N/A 04/19/2018   Procedure: ESOPHAGOGASTRODUODENOSCOPY (EGD) WITH PROPOFOL with Gastric Mapping;  Surgeon: [name], MD;  Location: [facility] ENDOSCOPY;  Service: Gastroenterology;  Laterality: N/A;   ESOPHAGOGASTRODUODENOSCOPY (EGD) WITH PROPOFOL N/A 12/13/2018   Procedure: ESOPHAGOGASTRODUODENOSCOPY (EGD) WITH PROPOFOL;  Surgeon: [name], MD;  Location: [facility] ENDOSCOPY;  Service: Gastroenterology;  Laterality: N/A;    ESOPHAGOGASTRODUODENOSCOPY (EGD) WITH PROPOFOL N/A 03/31/2019   Procedure: ESOPHAGOGASTRODUODENOSCOPY (EGD) WITH PROPOFOL;  Surgeon: Lin Landsman, MD;  Location: [facility] ENDOSCOPY;  Service: Gastroenterology;  Laterality: N/A;   IMPLANTABLE CARDIOVERTER DEFIBRILLATOR (ICD) GENERATOR CHANGE Left 02/21/2020   Procedure: ICD GENERATOR CHANGE;  Surgeon: Isaias Cowman, MD;  Location: [facility] ORS;  Service: Cardiovascular;  Laterality: Left;   VIDEO BRONCHOSCOPY WITH ENDOBRONCHIAL NAVIGATION N/A 06/22/2020   Procedure: VIDEO BRONCHOSCOPY WITH ENDOBRONCHIAL NAVIGATION;  Surgeon: Ottie Glazier, MD;  Location: [facility] ORS;  Service: Thoracic;  Laterality: N/A;   VIDEO BRONCHOSCOPY WITH ENDOBRONCHIAL ULTRASOUND N/A 06/22/2020   Procedure: VIDEO BRONCHOSCOPY WITH ENDOBRONCHIAL ULTRASOUND;  Surgeon: Ottie Glazier, MD;  Location: [facility] ORS;  Service: Thoracic;  Laterality: N/A;    Prior to Admission medications   Medication Sig Start Date End Date Taking? Authorizing Provider  amoxicillin-clavulanate (AUGMENTIN) 875-125 MG tablet Take 1 tablet by mouth 2 (two) times daily for 7 days. 07/01/20 07/08/20 Yes Blake Divine, MD  azithromycin (ZITHROMAX Z-PAK) 250 MG tablet Take 2 tablets (500 mg) on  Day 1,  followed by 1 tablet (250 mg) once daily on Days 2 through 5. 07/01/20 07/06/20 Yes Blake Divine, MD  aspirin 81 MG EC tablet Take 81 mg by mouth daily.    [provider]  atorvastatin (LIPITOR) 40 MG tablet TAKE 1 TABLET (40 MG TOTAL) BY MOUTH DAILY. Patient taking differently: Take 40 mg by mouth daily. 03/08/18   Cannady, Henrine Screws T, NP  Blood Glucose Monitoring Suppl (ACCU-CHEK GUIDE ME) w/Device KIT  02/23/20   [provider]  budesonide-formoterol (SYMBICORT) 160-4.5 MCG/ACT inhaler Inhale 2 puffs into the lungs 2 (two) times daily.    [provider]  buPROPion (WELLBUTRIN) 75 MG tablet Take 75 mg by mouth daily. 03/26/16 02/14/21  [provider]   clopidogrel (PLAVIX) 75 MG tablet TAKE 1 TABLET (75 MG TOTAL) BY MOUTH DAILY. 12/08/16   Kathrine Haddock, NP  empagliflozin (JARDIANCE) 10 MG TABS tablet Take 10 mg by mouth daily with breakfast.    [provider]  ENTRESTO 49-51 MG TAKE 1 TABLET BY MOUTH TWICE A DAY Patient taking differently: Take 1 tablet by mouth 2 (two) times daily. 06/21/19   Alisa Graff, FNP  furosemide (LASIX) 40 MG tablet Take 40 mg by mouth.    [provider]  glimepiride (AMARYL) 4 MG tablet Take 4 mg by mouth daily with breakfast. 02/23/20 02/22/21  [provider]  ipratropium-albuterol (DUONEB) 0.5-2.5 (3) MG/3ML SOLN Take 3 mLs by nebulization in the morning, at noon, and at bedtime.    [provider]  levocetirizine (XYZAL) 5 MG tablet Take 5 mg by mouth every evening. 11/10/18   [provider]  metFORMIN (GLUCOPHAGE) 1000 MG tablet TAKE 1 TABLET (1,000 MG TOTAL) BY MOUTH 2 (TWO) TIMES DAILY WITH A MEAL. Patient taking differently: Take 1,000 mg by mouth in the morning and at bedtime. 09/12/16   Kathrine Haddock, NP  metoprolol succinate (TOPROL-XL) 100 MG 24 hr tablet Take 1 tablet (100 mg total) by mouth daily. 06/28/19   Alisa Graff, FNP  ondansetron (ZOFRAN ODT) 4 MG disintegrating  tablet Take 1 tablet (4 mg total) by mouth every 8 (eight) hours as needed. 09/07/19   Rudene Re, MD  ONE [facility] ULTRA TEST test strip USE TO TEST BLOOD SUGAR ONCE DAILY 10/07/16   Park Liter P, DO  pantoprazole (PROTONIX) 40 MG tablet Take 1 tablet (40 mg total) by mouth 2 (two) times daily before a meal. 12/13/18 02/14/21  Vanga, Tally Due, MD  Propylene Glycol (SYSTANE BALANCE) 0.6 % SOLN Apply to eye.    [provider]  spironolactone (ALDACTONE) 25 MG tablet TAKE 1/2 TABLET BY MOUTH EVERY DAY Patient taking differently: Take 12.5 mg by mouth daily. 05/02/19   Alisa Graff, FNP  sucralfate (CARAFATE) 1 g tablet Take 1 g by mouth 3 (three) times daily before  meals. 05/15/17 02/14/21  [provider]  TRULICITY 1.5 XF/8.1WE SOPN Inject 0.5 mg into the skin every Sunday. 08/30/19   [provider]    Allergies Patient has no known allergies.  Family History  Problem Relation Age of Onset   Diabetes Mother    Heart attack Father 19    Social History Social History   Tobacco Use   Smoking status: Current Every Day Smoker    Packs/day: 0.25    Years: 40.00    Pack years: 10.00    Types: Cigarettes   Smokeless tobacco: Never Used  Scientific laboratory technician Use: Never used  Substance Use Topics   Alcohol use: No   Drug use: Never    Review of Systems  Constitutional: No fever/chills Eyes: No visual changes. ENT: No sore throat. Cardiovascular: Denies chest pain. Respiratory: Denies shortness of breath. Gastrointestinal: No abdominal pain.  Positive for flank pain.  No nausea, no vomiting.  No diarrhea.  No constipation. Genitourinary: Negative for dysuria. Musculoskeletal: Negative for back pain. Skin: Negative for rash. Neurological: Negative for headaches, focal weakness or numbness.  ____________________________________________   PHYSICAL EXAM:  VITAL SIGNS: ED Triage Vitals  Enc Vitals Group     BP 06/30/20 2034 (!) 99/51     Pulse Rate 06/30/20 2034 88     Resp 06/30/20 2034 16     Temp 06/30/20 2034 97.9 F (36.6 C)     Temp Source 06/30/20 2034 Oral     SpO2 06/30/20 2034 94 %     Weight 06/30/20 2035 136 lb 11 oz (62 kg)     Height 06/30/20 2035 '5\' 4"'  (1.626 m)     Head Circumference --      Peak Flow --      Pain Score 06/30/20 2035 7     Pain Loc --      Pain Edu? --      Excl. in [place]? --     Constitutional: Alert and oriented. Eyes: Conjunctivae are normal. Head: Atraumatic. Nose: No congestion/rhinnorhea. Mouth/Throat: Mucous membranes are moist. Neck: Normal ROM Cardiovascular: Normal rate, regular rhythm. Grossly normal heart sounds. Respiratory: Normal respiratory effort.   No retractions. Lungs CTAB.  Tenderness to palpation over left lower costal margin. Gastrointestinal: Soft and nontender.  No CVA tenderness noted.  No distention. Genitourinary: deferred Musculoskeletal: No lower extremity tenderness nor edema. Neurologic:  Normal speech and language. No gross focal neurologic deficits are appreciated. Skin:  Skin is warm, dry and intact. No rash noted. Psychiatric: Mood and affect are normal. Speech and behavior are normal.  ____________________________________________   LABS (all labs ordered are listed, but only abnormal results are displayed)  Labs Reviewed  COMPREHENSIVE METABOLIC  PANEL - Abnormal; Notable for the following components:      Result Value   Chloride 97 (*)    Glucose, Bld 230 (*)    All other components within normal limits  CBC WITH DIFFERENTIAL/PLATELET - Abnormal; Notable for the following components:   WBC 12.4 (*)    RDW 17.8 (*)    Neutro Abs 8.8 (*)    Abs Immature Granulocytes 0.08 (*)    All other components within normal limits  URINALYSIS, ROUTINE W REFLEX MICROSCOPIC - Abnormal; Notable for the following components:   Color, Urine YELLOW (*)    APPearance CLEAR (*)    Glucose, UA >=500 (*)    All other components within normal limits  SARS CORONAVIRUS 2 (TAT 6-24 HRS)  LIPASE, BLOOD  TROPONIN I (HIGH SENSITIVITY)   ____________________________________________  EKG  ED ECG REPORT I, Blake Divine, the attending physician, personally viewed and interpreted this ECG.   Date: 06/30/2020  EKG Time: 20:38  Rate: 90  Rhythm: normal sinus rhythm, frequent PVC's noted  Axis: RAD  Intervals:right bundle branch block  ST&T Change: Inferior T wave inversions, similar to previous   PROCEDURES  Procedure(s) performed (including Critical Care):  Procedures   ____________________________________________   INITIAL IMPRESSION / ASSESSMENT AND PLAN / ED COURSE       65 year old male with past medical  history of hypertension, hyperlipidemia, diabetes, COPD, CAD, CHF, and cirrhosis who presents to the ED with left flank and lateral lower chest wall pain constantly over the past 2 days.  Pain does seem to be worse with a deep breath and given his recent diagnosis of lung cancer, he would be at high risk for PE.  We will further assess with CTA of chest and treat pain with morphine.  EKG shows frequent PVCs but no acute ischemic changes, overall similar to previous.  Initial troponin is negative and his symptoms are atypical for ACS.  We will also check CT abdomen/pelvis given pain seems to extend into the left upper quadrant of his abdomen.  Additional labs are pending at this time.  CTA chest is negative for PE, does show evidence of malignancy similar to previous as well as new finding of multifocal infiltrates concerning for atypical pneumonia.  No acute process noted in the abdomen or pelvis.  Patient continues to deny significant difficulty breathing and is maintaining O2 sats on room air.  New finding of multifocal pneumonia could be contributing to his chest wall pain and we will perform testing for COVID-19, but cover for bacterial pneumonia for now.  Patient is appropriate for discharge home with close follow-up with either his PCP or pulmonologist.  He was counseled to return to the ED for new or worsening symptoms, patient and sister agree with plan.      ____________________________________________   FINAL CLINICAL IMPRESSION(S) / ED DIAGNOSES  Final diagnoses:  Chest pain, unspecified type  Multifocal pneumonia     ED Discharge Orders         Ordered    amoxicillin-clavulanate (AUGMENTIN) 875-125 MG tablet  2 times daily        07/01/20 0005    azithromycin (ZITHROMAX Z-PAK) 250 MG tablet        07/01/20 0005           Note:  This document was prepared using Dragon voice recognition software and may include unintentional dictation errors.   Blake Divine, MD 07/01/20  [phone]

## 2020-06-30 NOTE — ED Notes (Signed)
Patient transported to CT 

## 2020-06-30 NOTE — ED Triage Notes (Signed)
Pt c/o left sided abdominal pain that increases when pt stands up x2 days. Pt denies N/V/D. Pt denies pain with urination. Pt denies known injury or strain. Pt is AOX4, NAD noted.

## 2020-07-01 LAB — SARS CORONAVIRUS 2 (TAT 6-24 HRS): SARS Coronavirus 2: NEGATIVE

## 2020-07-01 MED ORDER — AMOXICILLIN-POT CLAVULANATE 875-125 MG PO TABS: 1.0000 | ORAL_TABLET | Freq: Two times a day (BID) | ORAL | 0 refills | Status: AC

## 2020-07-01 MED ORDER — AZITHROMYCIN 250 MG PO TABS: ORAL_TABLET | ORAL | 0 refills | Status: AC

## 2020-07-04 ENCOUNTER — Ambulatory Visit: Admission: RE | Admit: 2020-07-04 | Payer: Medicare Other | Source: Ambulatory Visit

## 2020-07-04 ENCOUNTER — Encounter: Payer: Self-pay | Admitting: *Deleted

## 2020-07-04 DIAGNOSIS — C3431 Malignant neoplasm of lower lobe, right bronchus or lung: Secondary | ICD-10-CM | POA: Insufficient documentation

## 2020-07-04 NOTE — Progress Notes (Signed)
  Oncology Nurse Navigator Documentation  Navigator Location: [facility]-Med Onc (07/04/20 1500)   )Navigator Encounter Type: Lobby (07/04/20 1500)                     Patient Visit Type: RadOnc (07/04/20 1500) Treatment Phase: CT SIM (07/04/20 1500) Barriers/Navigation Needs: No Barriers At This Time;No Needs (07/04/20 1500)   Interventions: None Required (07/04/20 1500)                      Time Spent with Patient: 30 (07/04/20 1500)

## 2020-07-11 ENCOUNTER — Inpatient Hospital Stay: Payer: Medicare Other | Attending: Oncology | Admitting: Hospice and Palliative Medicine

## 2020-07-11 ENCOUNTER — Other Ambulatory Visit: Payer: Self-pay

## 2020-07-11 DIAGNOSIS — R918 Other nonspecific abnormal finding of lung field: Secondary | ICD-10-CM

## 2020-07-11 NOTE — Progress Notes (Signed)
Multidisciplinary Oncology Council Documentation  John Frank was presented by our [facility] on 07/11/2020, which included representatives from:  . Palliative Care . Dietitian  . Physical/Occupational Therapist . Nurse Navigator . Genetics . Speech Therapist . Social work . Survivorship RN . Hotel manager . Research RN . [place] Representative  John Frank currently presents with history of early stage lung cancer  We reviewed previous medical and familial history, history of present illness, and recent lab results along with all available histopathologic and imaging studies. The [place] considered available treatment options and made the following recommendations/referrals:  No recommendations at present time. Monitor for nutrition and therapy needs.   The MOC is a meeting of clinicians from various specialty areas who evaluate and discuss patients for whom a multidisciplinary approach is being considered. Final determinations in the plan of care are those of the provider(s).   Today's extended care, comprehensive team conference, John Frank was not present for the discussion and was not examined.

## 2020-07-13 DIAGNOSIS — C3431 Malignant neoplasm of lower lobe, right bronchus or lung: Secondary | ICD-10-CM | POA: Insufficient documentation

## 2020-07-15 ENCOUNTER — Other Ambulatory Visit: Payer: Self-pay | Admitting: Family

## 2020-07-15 DIAGNOSIS — I5022 Chronic systolic (congestive) heart failure: Secondary | ICD-10-CM

## 2020-07-16 ENCOUNTER — Encounter: Payer: Self-pay | Admitting: *Deleted

## 2020-07-16 ENCOUNTER — Ambulatory Visit
Admission: RE | Admit: 2020-07-16 | Discharge: 2020-07-16 | Disposition: A | Discharge status: Home / Self Care | Payer: Medicare Other | Source: Ambulatory Visit | Attending: Radiation Oncology | Admitting: Radiation Oncology

## 2020-07-16 DIAGNOSIS — C3431 Malignant neoplasm of lower lobe, right bronchus or lung: Secondary | ICD-10-CM | POA: Diagnosis not present

## 2020-07-16 NOTE — Progress Notes (Signed)
  Oncology Nurse Navigator Documentation  Navigator Location: [facility]-Med Onc (07/16/20 1000)   )Navigator Encounter Type: Appt/Treatment Plan Review (07/16/20 1000)                   Treatment Initiated Date: 07/16/20 (07/16/20 1000) Patient Visit Type: RadOnc (07/16/20 1000) Treatment Phase: First Radiation Tx (07/16/20 1000) Barriers/Navigation Needs: No Barriers At This Time (07/16/20 1000)   Interventions: None Required (07/16/20 1000)             appt/treatment plan review. Will follow up with patient next week during radiation treatment.         Time Spent with Patient: 15 (07/16/20 1000)

## 2020-07-18 ENCOUNTER — Ambulatory Visit
Admission: RE | Admit: 2020-07-18 | Discharge: 2020-07-18 | Disposition: A | Discharge status: Home / Self Care | Payer: Medicare Other | Source: Ambulatory Visit | Attending: Radiation Oncology | Admitting: Radiation Oncology

## 2020-07-18 ENCOUNTER — Other Ambulatory Visit: Payer: Self-pay | Admitting: Licensed Clinical Social Worker

## 2020-07-18 DIAGNOSIS — R911 Solitary pulmonary nodule: Secondary | ICD-10-CM

## 2020-07-18 DIAGNOSIS — K219 Gastro-esophageal reflux disease without esophagitis: Secondary | ICD-10-CM

## 2020-07-18 DIAGNOSIS — C3431 Malignant neoplasm of lower lobe, right bronchus or lung: Secondary | ICD-10-CM | POA: Diagnosis not present

## 2020-07-18 MED ORDER — LANSOPRAZOLE 15 MG PO CPDR: 15.0000 mg | DELAYED_RELEASE_CAPSULE | Freq: Every day | ORAL | 3 refills | Status: DC

## 2020-07-18 MED ORDER — PROMETHAZINE HCL 25 MG PO TABS: 25.0000 mg | ORAL_TABLET | Freq: Four times a day (QID) | ORAL | 0 refills | Status: DC | PRN

## 2020-07-23 ENCOUNTER — Ambulatory Visit
Admission: RE | Admit: 2020-07-23 | Discharge: 2020-07-23 | Disposition: A | Discharge status: Home / Self Care | Payer: Medicare Other | Source: Ambulatory Visit | Attending: Radiation Oncology | Admitting: Radiation Oncology

## 2020-07-23 DIAGNOSIS — C3431 Malignant neoplasm of lower lobe, right bronchus or lung: Secondary | ICD-10-CM | POA: Diagnosis not present

## 2020-07-25 ENCOUNTER — Ambulatory Visit
Admission: RE | Admit: 2020-07-25 | Discharge: 2020-07-25 | Disposition: A | Discharge status: Home / Self Care | Payer: Medicare Other | Source: Ambulatory Visit | Attending: Radiation Oncology | Admitting: Radiation Oncology

## 2020-07-25 DIAGNOSIS — C3431 Malignant neoplasm of lower lobe, right bronchus or lung: Secondary | ICD-10-CM | POA: Diagnosis not present

## 2020-07-30 ENCOUNTER — Ambulatory Visit
Admission: RE | Admit: 2020-07-30 | Discharge: 2020-07-30 | Disposition: A | Discharge status: Home / Self Care | Payer: Medicare Other | Source: Ambulatory Visit | Attending: Radiation Oncology | Admitting: Radiation Oncology

## 2020-07-30 DIAGNOSIS — C3431 Malignant neoplasm of lower lobe, right bronchus or lung: Secondary | ICD-10-CM | POA: Diagnosis not present

## 2020-08-01 ENCOUNTER — Emergency Department: Payer: Medicare Other

## 2020-08-01 ENCOUNTER — Other Ambulatory Visit: Payer: Self-pay

## 2020-08-01 ENCOUNTER — Telehealth: Payer: Self-pay | Admitting: *Deleted

## 2020-08-01 ENCOUNTER — Emergency Department
Admission: EM | Admit: 2020-08-01 | Discharge: 2020-08-02 | Disposition: A | Discharge status: Home / Self Care | Payer: Medicare Other | Attending: Emergency Medicine | Admitting: Emergency Medicine

## 2020-08-01 ENCOUNTER — Encounter: Payer: Self-pay | Admitting: Emergency Medicine

## 2020-08-01 DIAGNOSIS — E119 Type 2 diabetes mellitus without complications: Secondary | ICD-10-CM | POA: Diagnosis not present

## 2020-08-01 DIAGNOSIS — Z7984 Long term (current) use of oral hypoglycemic drugs: Secondary | ICD-10-CM | POA: Diagnosis not present

## 2020-08-01 DIAGNOSIS — Z79899 Other long term (current) drug therapy: Secondary | ICD-10-CM | POA: Diagnosis not present

## 2020-08-01 DIAGNOSIS — Z7902 Long term (current) use of antithrombotics/antiplatelets: Secondary | ICD-10-CM | POA: Insufficient documentation

## 2020-08-01 DIAGNOSIS — R1011 Right upper quadrant pain: Secondary | ICD-10-CM | POA: Diagnosis not present

## 2020-08-01 DIAGNOSIS — F17210 Nicotine dependence, cigarettes, uncomplicated: Secondary | ICD-10-CM | POA: Insufficient documentation

## 2020-08-01 DIAGNOSIS — I5022 Chronic systolic (congestive) heart failure: Secondary | ICD-10-CM | POA: Insufficient documentation

## 2020-08-01 DIAGNOSIS — I2510 Atherosclerotic heart disease of native coronary artery without angina pectoris: Secondary | ICD-10-CM | POA: Insufficient documentation

## 2020-08-01 DIAGNOSIS — Z7982 Long term (current) use of aspirin: Secondary | ICD-10-CM | POA: Insufficient documentation

## 2020-08-01 DIAGNOSIS — Z950 Presence of cardiac pacemaker: Secondary | ICD-10-CM | POA: Diagnosis not present

## 2020-08-01 DIAGNOSIS — I110 Hypertensive heart disease with heart failure: Secondary | ICD-10-CM | POA: Diagnosis not present

## 2020-08-01 DIAGNOSIS — R109 Unspecified abdominal pain: Secondary | ICD-10-CM

## 2020-08-01 DIAGNOSIS — J449 Chronic obstructive pulmonary disease, unspecified: Secondary | ICD-10-CM | POA: Diagnosis not present

## 2020-08-01 LAB — BASIC METABOLIC PANEL
Anion gap: 8 (ref 5–15)
BUN: 10 mg/dL (ref 8–23)
CO2: 24 mmol/L (ref 22–32)
Calcium: 9 mg/dL (ref 8.9–10.3)
Chloride: 102 mmol/L (ref 98–111)
Creatinine, Ser: 0.71 mg/dL (ref 0.61–1.24)
GFR, Estimated: >60 mL/min (ref >60)
Glucose, Bld: 161 mg/dL — ABNORMAL HIGH (ref 70–99)
Potassium: 3.9 mmol/L (ref 3.5–5.1)
Sodium: 134 mmol/L — ABNORMAL LOW (ref 135–145)

## 2020-08-01 LAB — CBC
HCT: 34.9 % — ABNORMAL LOW (ref 39.0–52.0)
Hemoglobin: 11.4 g/dL — ABNORMAL LOW (ref 13.0–17.0)
MCH: 30 pg (ref 26.0–34.0)
MCHC: 32.7 g/dL (ref 30.0–36.0)
MCV: 91.8 fL (ref 80.0–100.0)
Platelets: 228 10*3/uL (ref 150–400)
RBC: 3.8 MIL/uL — ABNORMAL LOW (ref 4.22–5.81)
RDW: 17.3 % — ABNORMAL HIGH (ref 11.5–15.5)
WBC: 7.1 10*3/uL (ref 4.0–10.5)
nRBC: 0 % (ref 0.0–0.2)

## 2020-08-01 LAB — HEPATIC FUNCTION PANEL
ALT: 14 U/L (ref 0–44)
AST: 16 U/L (ref 15–41)
Albumin: 3.8 g/dL (ref 3.5–5.0)
Alkaline Phosphatase: 97 U/L (ref 38–126)
Bilirubin, Direct: 0.1 mg/dL (ref 0.0–0.2)
Indirect Bilirubin: 0.8 mg/dL (ref 0.3–0.9)
Total Bilirubin: 0.9 mg/dL (ref 0.3–1.2)
Total Protein: 6.8 g/dL (ref 6.5–8.1)

## 2020-08-01 LAB — TROPONIN I (HIGH SENSITIVITY)
Troponin I (High Sensitivity): 9 ng/L (ref <18)
Troponin I (High Sensitivity): 10 ng/L (ref <18)

## 2020-08-01 LAB — LIPASE, BLOOD: Lipase: 24 U/L (ref 11–51)

## 2020-08-01 MED ORDER — IOHEXOL 300 MG/ML  SOLN
100.0000 mL | Freq: Once | INTRAMUSCULAR | Status: AC | PRN
  Administered 2020-08-01: 100 mL via INTRAVENOUS

## 2020-08-01 NOTE — ED Provider Notes (Signed)
[facility] Emergency Department Provider Note   ____________________________________________   I have reviewed the triage vital signs and the nursing notes.   HISTORY  Chief Complaint Abdominal pain  History limited by: Not Limited   HPI John Frank is a 65 y.o. male who presents to the emergency department today because of concerns for right upper quadrant abdominal pain.  Patient states the pain has been going on for about 2 weeks.  He describes it as a feeling a bit a knife stabbing him there.  He denies any associated nausea or vomiting.  Denies any diarrhea.  No bloody stools.  No fevers.  He does state that about a week prior to the symptoms starting he had a biopsy done on a lung nodule.   Records reviewed. Per medical record review patient has a history of CAD, COPD.   Past Medical History:  Diagnosis Date  . AICD (automatic cardioverter/defibrillator) present   . Angina at rest [facility])   . Aortic atherosclerosis ([place])   . CAD (coronary artery disease)    s/p stents in 2017  . Chronic systolic CHF (congestive heart failure) ([place])   . Cirrhosis of liver ([place])   . COPD (chronic obstructive pulmonary disease) ([place])   . Diabetes ([place])   . GERD (gastroesophageal reflux disease)   . Heart attack ([place])   . Hyperlipidemia   . Hypertension   . Ischemic cardiomyopathy   . Paroxysmal ventricular tachycardia ([place])   . Right lower lobe lung mass   . Shortness of breath dyspnea   . Sleep apnea     Patient Active Problem List   Diagnosis Date Noted  . Pain due to onychomycosis of toenails of both feet 03/15/2020  . Ulcer of esophagus without bleeding   . Gastritis with intestinal metaplasia of stomach   . COPD with acute exacerbation ([place]) 05/25/2018  . Iron deficiency anemia   . Cirrhosis of liver with ascites ([place])   . Chronic systolic heart failure ([place]) 08/31/2017  . CHF exacerbation ([place]) 08/09/2017  . Syncope 01/11/2016  . Tobacco abuse  04/06/2015  . HTN (hypertension) 11/03/2014  . Sleep apnea 10/26/2014  . CAD (coronary artery disease) 10/26/2014  . GERD (gastroesophageal reflux disease) 10/26/2014  . Polyp of colon 10/26/2014  . Diabetes mellitus without complication ([place]) 03/05/8526  . Hyperlipidemia 10/26/2014  . COPD, severe ([place]) 10/26/2014  . Status cardiac pacemaker 10/26/2014  . Lesion of nasal septum 10/26/2014    Past Surgical History:  Procedure Laterality Date  . CARDIAC PACEMAKER PLACEMENT    . cardiac stents    . CARDIAC SURGERY     defibrillator and pacemaker  . CORONARY ANGIOPLASTY    . ESOPHAGOGASTRODUODENOSCOPY N/A 09/19/2014   Procedure: ESOPHAGOGASTRODUODENOSCOPY (EGD);  Surgeon: Lucilla Lame, MD;  Location: [facility] ENDOSCOPY;  Service: Endoscopy;  Laterality: N/A;  . ESOPHAGOGASTRODUODENOSCOPY (EGD) WITH PROPOFOL N/A 09/28/2017   Procedure: ESOPHAGOGASTRODUODENOSCOPY (EGD) WITH PROPOFOL;  Surgeon: Virgel Manifold, MD;  Location: [facility] ENDOSCOPY;  Service: Endoscopy;  Laterality: N/A;  . ESOPHAGOGASTRODUODENOSCOPY (EGD) WITH PROPOFOL N/A 04/19/2018   Procedure: ESOPHAGOGASTRODUODENOSCOPY (EGD) WITH PROPOFOL with Gastric Mapping;  Surgeon: [name], MD;  Location: [facility] ENDOSCOPY;  Service: Gastroenterology;  Laterality: N/A;  . ESOPHAGOGASTRODUODENOSCOPY (EGD) WITH PROPOFOL N/A 12/13/2018   Procedure: ESOPHAGOGASTRODUODENOSCOPY (EGD) WITH PROPOFOL;  Surgeon: [name], MD;  Location: [facility] ENDOSCOPY;  Service: Gastroenterology;  Laterality: N/A;  . ESOPHAGOGASTRODUODENOSCOPY (EGD) WITH PROPOFOL N/A 03/31/2019   Procedure: ESOPHAGOGASTRODUODENOSCOPY (EGD) WITH PROPOFOL;  Surgeon: Sherri Sear  Reece Levy, MD;  Location: [place];  Service: Gastroenterology;  Laterality: N/A;  . IMPLANTABLE CARDIOVERTER DEFIBRILLATOR (ICD) GENERATOR CHANGE Left 02/21/2020   Procedure: ICD GENERATOR CHANGE;  Surgeon: Isaias Cowman, MD;  Location: [facility] ORS;  Service: Cardiovascular;  Laterality: Left;  Marland Kitchen VIDEO  BRONCHOSCOPY WITH ENDOBRONCHIAL NAVIGATION N/A 06/22/2020   Procedure: VIDEO BRONCHOSCOPY WITH ENDOBRONCHIAL NAVIGATION;  Surgeon: Ottie Glazier, MD;  Location: [facility] ORS;  Service: Thoracic;  Laterality: N/A;  . VIDEO BRONCHOSCOPY WITH ENDOBRONCHIAL ULTRASOUND N/A 06/22/2020   Procedure: VIDEO BRONCHOSCOPY WITH ENDOBRONCHIAL ULTRASOUND;  Surgeon: Ottie Glazier, MD;  Location: [facility] ORS;  Service: Thoracic;  Laterality: N/A;    Prior to Admission medications   Medication Sig Start Date End Date Taking? Authorizing Provider  aspirin 81 MG EC tablet Take 81 mg by mouth daily.    [provider]  atorvastatin (LIPITOR) 40 MG tablet TAKE 1 TABLET (40 MG TOTAL) BY MOUTH DAILY. Patient taking differently: Take 40 mg by mouth daily. 03/08/18   Cannady, Henrine Screws T, NP  Blood Glucose Monitoring Suppl (ACCU-CHEK GUIDE ME) w/Device KIT  02/23/20   [provider]  budesonide-formoterol (SYMBICORT) 160-4.5 MCG/ACT inhaler Inhale 2 puffs into the lungs 2 (two) times daily.    [provider]  buPROPion (WELLBUTRIN) 75 MG tablet Take 75 mg by mouth daily. 03/26/16 02/14/21  [provider]  clopidogrel (PLAVIX) 75 MG tablet TAKE 1 TABLET (75 MG TOTAL) BY MOUTH DAILY. 12/08/16   Kathrine Haddock, NP  empagliflozin (JARDIANCE) 10 MG TABS tablet Take 10 mg by mouth daily with breakfast.    [provider]  ENTRESTO 49-51 MG TAKE 1 TABLET BY MOUTH TWICE A DAY Patient taking differently: Take 1 tablet by mouth 2 (two) times daily. 06/21/19   Alisa Graff, FNP  furosemide (LASIX) 40 MG tablet Take 40 mg by mouth.    [provider]  glimepiride (AMARYL) 4 MG tablet Take 4 mg by mouth daily with breakfast. 02/23/20 02/22/21  [provider]  ipratropium-albuterol (DUONEB) 0.5-2.5 (3) MG/3ML SOLN Take 3 mLs by nebulization in the morning, at noon, and at bedtime.    [provider]  lansoprazole (PREVACID) 15 MG capsule Take 1 capsule (15 mg total) by  mouth daily. 07/18/20   Noreene Filbert, MD  levocetirizine (XYZAL) 5 MG tablet Take 5 mg by mouth every evening. 11/10/18   [provider]  metFORMIN (GLUCOPHAGE) 1000 MG tablet TAKE 1 TABLET (1,000 MG TOTAL) BY MOUTH 2 (TWO) TIMES DAILY WITH A MEAL. Patient taking differently: Take 1,000 mg by mouth in the morning and at bedtime. 09/12/16   Kathrine Haddock, NP  metoprolol succinate (TOPROL-XL) 100 MG 24 hr tablet TAKE 1 TABLET BY MOUTH EVERY DAY 07/15/20   Darylene Price A, FNP  ondansetron (ZOFRAN ODT) 4 MG disintegrating tablet Take 1 tablet (4 mg total) by mouth every 8 (eight) hours as needed. 09/07/19   Rudene Re, MD  ONE [facility] ULTRA TEST test strip USE TO TEST BLOOD SUGAR ONCE DAILY 10/07/16   Park Liter P, DO  pantoprazole (PROTONIX) 40 MG tablet Take 1 tablet (40 mg total) by mouth 2 (two) times daily before a meal. 12/13/18 02/14/21  Vanga, Tally Due, MD  promethazine (PHENERGAN) 25 MG tablet Take 1 tablet (25 mg total) by mouth every 6 (six) hours as needed for nausea or vomiting. 07/18/20   Chrystal, Eulas Post, MD  Propylene Glycol (SYSTANE BALANCE) 0.6 % SOLN Apply to eye.    [provider]  spironolactone (ALDACTONE)  25 MG tablet TAKE 1/2 TABLET BY MOUTH EVERY DAY Patient taking differently: Take 12.5 mg by mouth daily. 05/02/19   Alisa Graff, FNP  sucralfate (CARAFATE) 1 g tablet Take 1 g by mouth 3 (three) times daily before meals. 05/15/17 02/14/21  [provider]  TRULICITY 1.5 WU/9.8JX SOPN Inject 0.5 mg into the skin every Sunday. 08/30/19   [provider]    Allergies Patient has no known allergies.  Family History  Problem Relation Age of Onset  . Diabetes Mother   . Heart attack Father 65    Social History Social History   Tobacco Use  . Smoking status: Current Every Day Smoker    Packs/day: 0.25    Years: 40.00    Pack years: 10.00    Types: Cigarettes  . Smokeless tobacco: Never Used  Vaping Use  . Vaping Use: Never used   Substance Use Topics  . Alcohol use: No  . Drug use: Never    Review of Systems Constitutional: No fever/chills Eyes: No visual changes. ENT: No sore throat. Cardiovascular: Denies chest pain. Respiratory: Denies shortness of breath. Gastrointestinal: Positive for right upper quadrant abdominal pain. Genitourinary: Negative for dysuria. Musculoskeletal: Negative for back pain. Skin: Negative for rash. Neurological: Negative for headaches, focal weakness or numbness.  ____________________________________________   PHYSICAL EXAM:  VITAL SIGNS: ED Triage Vitals  Enc Vitals Group     BP 08/01/20 2002 101/69     Pulse Rate 08/01/20 2002 93     Resp 08/01/20 2002 18     Temp 08/01/20 2002 98.2 F (36.8 C)     Temp Source 08/01/20 2002 Oral     SpO2 08/01/20 2002 96 %     Weight --      Height 08/01/20 2003 '5\' 4"'  (1.626 m)     Head Circumference --      Peak Flow --      Pain Score 08/01/20 2003 10   Constitutional: Alert and oriented.  Eyes: Conjunctivae are normal.  ENT      Head: Normocephalic and atraumatic.      Nose: No congestion/rhinnorhea.      Mouth/Throat: Mucous membranes are moist.      Neck: No stridor. Hematological/Lymphatic/Immunilogical: No cervical lymphadenopathy. Cardiovascular: Normal rate, regular rhythm.  No murmurs, rubs, or gallops.  Respiratory: Normal respiratory effort without tachypnea nor retractions. Breath sounds are clear and equal bilaterally. No wheezes/rales/rhonchi. Gastrointestinal: Soft and minimally tender in the right upper quadrant.  Genitourinary: Deferred Musculoskeletal: Normal range of motion in all extremities. No lower extremity edema. Neurologic:  Normal speech and language. No gross focal neurologic deficits are appreciated.  Skin:  Skin is warm, dry and intact. No rash noted. Psychiatric: Mood and affect are normal. Speech and behavior are normal. Patient exhibits appropriate insight and  judgment.  ____________________________________________    LABS (pertinent positives/negatives)  Trop hs 9 CBC wbc 7.1, hgb 11.4, plt 228 BMP na 134, glu 161  ____________________________________________   EKG  I, Nance Pear, attending physician, personally viewed and interpreted this EKG  EKG Time: 2006 Rate: 94 Rhythm: sinus rhythm with PVC Axis: normal Intervals: qtc 485 QRS: Incomplete LBBB ST changes: no st elevation Impression: abnormal ekg ____________________________________________    RADIOLOGY  CXR No acute abnormality  ____________________________________________   PROCEDURES  Procedures  ____________________________________________   INITIAL IMPRESSION / ASSESSMENT AND PLAN / ED COURSE  Pertinent labs & imaging results that were available during my care of the patient were reviewed by  me and considered in my medical decision making (see chart for details).   Patient presented to the emergency department today because of concerns for right upper quadrant abdominal pain.  Patient did have a recent biopsy of his right lung however where patient is describing the pain it does not sound necessarily related to the biopsy.  Chest x-ray did not show any pneumothorax or concerning effusion.  Patient was mildly tender in the right upper quadrant.  Will get a CT scan to evaluate.  ____________________________________________   FINAL CLINICAL IMPRESSION(S) / ED DIAGNOSES  Final diagnoses:  Abdominal pain, unspecified abdominal location     Note: This dictation was prepared with Dragon dictation. Any transcriptional errors that result from this process are unintentional     Nance Pear, MD 08/01/20 2249

## 2020-08-01 NOTE — ED Triage Notes (Signed)
Patient with complaint of right lung pain. Patient states that he had a biopsy done about 2 weeks ago and has had sharp pain since. Patient denies shortness of breath.

## 2020-08-01 NOTE — Discharge Instructions (Signed)
Use Tylenol for pain and fevers.  Up to 1000 mg per dose, up to 4 times per day.  Do not take more than 4000 mg of Tylenol/acetaminophen within 24 hours..  Return to the ED with any worsening pain despite these medication

## 2020-08-01 NOTE — Telephone Encounter (Signed)
Spoke with pt's sister, Judeen Hammans, to inform that pt needs referral to ENT to follow up asymmetic hypermetabolism of the left nasopharynx seen on recent PET scan per Dr. Tasia Catchings. Judeen Hammans verbalized understanding and is in agreement with referral. Referral faxed to Dr. Pryor Ochoa and [facility].

## 2020-08-27 ENCOUNTER — Emergency Department: Payer: Medicare Other

## 2020-08-27 ENCOUNTER — Other Ambulatory Visit: Payer: Self-pay

## 2020-08-27 ENCOUNTER — Emergency Department
Admission: EM | Admit: 2020-08-27 | Discharge: 2020-08-27 | Disposition: A | Discharge status: Home / Self Care | Payer: Medicare Other | Attending: Emergency Medicine | Admitting: Emergency Medicine

## 2020-08-27 DIAGNOSIS — I2510 Atherosclerotic heart disease of native coronary artery without angina pectoris: Secondary | ICD-10-CM | POA: Insufficient documentation

## 2020-08-27 DIAGNOSIS — Z7951 Long term (current) use of inhaled steroids: Secondary | ICD-10-CM | POA: Insufficient documentation

## 2020-08-27 DIAGNOSIS — Z7902 Long term (current) use of antithrombotics/antiplatelets: Secondary | ICD-10-CM | POA: Insufficient documentation

## 2020-08-27 DIAGNOSIS — I5023 Acute on chronic systolic (congestive) heart failure: Secondary | ICD-10-CM

## 2020-08-27 DIAGNOSIS — F17210 Nicotine dependence, cigarettes, uncomplicated: Secondary | ICD-10-CM | POA: Insufficient documentation

## 2020-08-27 DIAGNOSIS — J441 Chronic obstructive pulmonary disease with (acute) exacerbation: Secondary | ICD-10-CM | POA: Diagnosis not present

## 2020-08-27 DIAGNOSIS — E119 Type 2 diabetes mellitus without complications: Secondary | ICD-10-CM | POA: Insufficient documentation

## 2020-08-27 DIAGNOSIS — Z950 Presence of cardiac pacemaker: Secondary | ICD-10-CM | POA: Insufficient documentation

## 2020-08-27 DIAGNOSIS — Z7982 Long term (current) use of aspirin: Secondary | ICD-10-CM | POA: Diagnosis not present

## 2020-08-27 DIAGNOSIS — Z7984 Long term (current) use of oral hypoglycemic drugs: Secondary | ICD-10-CM | POA: Insufficient documentation

## 2020-08-27 DIAGNOSIS — Z79899 Other long term (current) drug therapy: Secondary | ICD-10-CM | POA: Diagnosis not present

## 2020-08-27 DIAGNOSIS — I110 Hypertensive heart disease with heart failure: Secondary | ICD-10-CM | POA: Insufficient documentation

## 2020-08-27 DIAGNOSIS — R0602 Shortness of breath: Secondary | ICD-10-CM | POA: Diagnosis present

## 2020-08-27 LAB — CBC WITH DIFFERENTIAL/PLATELET
Abs Immature Granulocytes: 0.04 10*3/uL (ref 0.00–0.07)
Basophils Absolute: 0.1 10*3/uL (ref 0.0–0.1)
Basophils Relative: 1 %
Eosinophils Absolute: 0.1 10*3/uL (ref 0.0–0.5)
Eosinophils Relative: 1 %
HCT: 36 % — ABNORMAL LOW (ref 39.0–52.0)
Hemoglobin: 11.7 g/dL — ABNORMAL LOW (ref 13.0–17.0)
Immature Granulocytes: 1 %
Lymphocytes Relative: 12 %
Lymphs Abs: 1 10*3/uL (ref 0.7–4.0)
MCH: 29.9 pg (ref 26.0–34.0)
MCHC: 32.5 g/dL (ref 30.0–36.0)
MCV: 92.1 fL (ref 80.0–100.0)
Monocytes Absolute: 0.7 10*3/uL (ref 0.1–1.0)
Monocytes Relative: 8 %
Neutro Abs: 6.4 10*3/uL (ref 1.7–7.7)
Neutrophils Relative %: 77 %
Platelets: 234 10*3/uL (ref 150–400)
RBC: 3.91 MIL/uL — ABNORMAL LOW (ref 4.22–5.81)
RDW: 18 % — ABNORMAL HIGH (ref 11.5–15.5)
WBC: 8.2 10*3/uL (ref 4.0–10.5)
nRBC: 0 % (ref 0.0–0.2)

## 2020-08-27 LAB — COMPREHENSIVE METABOLIC PANEL
ALT: 16 U/L (ref 0–44)
AST: 21 U/L (ref 15–41)
Albumin: 3.8 g/dL (ref 3.5–5.0)
Alkaline Phosphatase: 112 U/L (ref 38–126)
Anion gap: 9 (ref 5–15)
BUN: 9 mg/dL (ref 8–23)
CO2: 28 mmol/L (ref 22–32)
Calcium: 9 mg/dL (ref 8.9–10.3)
Chloride: 101 mmol/L (ref 98–111)
Creatinine, Ser: 0.68 mg/dL (ref 0.61–1.24)
GFR, Estimated: >60 mL/min (ref >60)
Glucose, Bld: 227 mg/dL — ABNORMAL HIGH (ref 70–99)
Potassium: 3.6 mmol/L (ref 3.5–5.1)
Sodium: 138 mmol/L (ref 135–145)
Total Bilirubin: 1 mg/dL (ref 0.3–1.2)
Total Protein: 6.8 g/dL (ref 6.5–8.1)

## 2020-08-27 LAB — BRAIN NATRIURETIC PEPTIDE: B Natriuretic Peptide: 822.9 pg/mL — ABNORMAL HIGH (ref 0.0–100.0)

## 2020-08-27 LAB — TROPONIN I (HIGH SENSITIVITY): Troponin I (High Sensitivity): 17 ng/L (ref <18)

## 2020-08-27 LAB — MAGNESIUM: Magnesium: 1.9 mg/dL (ref 1.7–2.4)

## 2020-08-27 MED ORDER — PREDNISONE 50 MG PO TABS: 50.0000 mg | ORAL_TABLET | Freq: Every day | ORAL | 0 refills | Status: DC

## 2020-08-27 MED ORDER — METHYLPREDNISOLONE SODIUM SUCC 125 MG IJ SOLR
125.0000 mg | Freq: Once | INTRAMUSCULAR | Status: AC
  Administered 2020-08-27: 125 mg via INTRAVENOUS
  Filled 2020-08-27: qty 2

## 2020-08-27 MED ORDER — DOXYCYCLINE HYCLATE 100 MG PO TABS: 100.0000 mg | ORAL_TABLET | Freq: Two times a day (BID) | ORAL | 0 refills | Status: AC

## 2020-08-27 MED ORDER — IPRATROPIUM-ALBUTEROL 0.5-2.5 (3) MG/3ML IN SOLN
6.0000 mL | Freq: Once | RESPIRATORY_TRACT | Status: AC
  Administered 2020-08-27: 6 mL via RESPIRATORY_TRACT
  Filled 2020-08-27: qty 6

## 2020-08-27 MED ORDER — FUROSEMIDE 40 MG PO TABS: 40.0000 mg | ORAL_TABLET | Freq: Every day | ORAL | 0 refills | Status: DC

## 2020-08-27 MED ORDER — ALBUTEROL SULFATE HFA 108 (90 BASE) MCG/ACT IN AERS
2.0000 | INHALATION_SPRAY | RESPIRATORY_TRACT | Status: DC | PRN
  Filled 2020-08-27: qty 6.7

## 2020-08-27 MED ORDER — DOXYCYCLINE HYCLATE 100 MG PO TABS
100.0000 mg | ORAL_TABLET | Freq: Once | ORAL | Status: AC
  Administered 2020-08-27: 100 mg via ORAL
  Filled 2020-08-27: qty 1

## 2020-08-27 MED ORDER — FUROSEMIDE 10 MG/ML IJ SOLN
40.0000 mg | Freq: Once | INTRAMUSCULAR | Status: AC
  Administered 2020-08-27: 40 mg via INTRAVENOUS
  Filled 2020-08-27: qty 4

## 2020-08-27 NOTE — Progress Notes (Signed)
Patient ID: John Frank, male    DOB: Sep 28, 1955, 65 y.o.   MRN: [id number]   John Frank is a 65 y/o male with a history of CAD, DM, hyperlipidemia, HTN, GERD, COPD, obstructive sleep apnea, chronic heart failure and current tobacco use.   Echo report from 11/19/2018 reviewed and showed an EF of 40-45%. Echo report from 04/13/17 reviewed and showed an EF of 30% along with mild John/TR.  Was in the ED 08/27/20 due to COPD/ HF exacerbation. Says that he's been without diuretic for several months but he doesn't know why. Given IV lasix, solu-medrol and antibiotics. Admission recommended but patient refused and he was released. Was in the ED 08/01/20 due to abdominal pain. Evaluated and released.    He presents today for a follow-up visit with a chief complaint of minimal shortness of breath with moderate exertion. He says that this has been present for several weeks and resulted in an ED visit yesterday. He has associated cough along with this making it difficult to sleep. He denies any dizziness, abdominal distention, palpitations, pedal edema, chest pain, wheezing, fatigue or weight gain.   He says that his coughing is worse when he lies down and improves when he sits up. He says that he started his prednisone and antibiotic today. He says that he wasn't taking his fluid pill for a few weeks prior to the ED visit because it made him "pee a lot". Has only been using his symbicort inhaler PRN.   Past Medical History:  Diagnosis Date  . AICD (automatic cardioverter/defibrillator) present   . Angina at rest [facility])   . Aortic atherosclerosis ([place])   . CAD (coronary artery disease)    s/p stents in 2017  . Chronic systolic CHF (congestive heart failure) ([place])   . Cirrhosis of liver ([place])   . COPD (chronic obstructive pulmonary disease) ([place])   . Diabetes ([place])   . GERD (gastroesophageal reflux disease)   . Heart attack ([place])   . Hyperlipidemia   . Hypertension   . Ischemic cardiomyopathy   .  Paroxysmal ventricular tachycardia ([place])   . Right lower lobe lung mass   . Shortness of breath dyspnea   . Sleep apnea    Past Surgical History:  Procedure Laterality Date  . CARDIAC PACEMAKER PLACEMENT    . cardiac stents    . CARDIAC SURGERY     defibrillator and pacemaker  . CORONARY ANGIOPLASTY    . ESOPHAGOGASTRODUODENOSCOPY N/A 09/19/2014   Procedure: ESOPHAGOGASTRODUODENOSCOPY (EGD);  Surgeon: Lucilla Lame, MD;  Location: [facility] ENDOSCOPY;  Service: Endoscopy;  Laterality: N/A;  . ESOPHAGOGASTRODUODENOSCOPY (EGD) WITH PROPOFOL N/A 09/28/2017   Procedure: ESOPHAGOGASTRODUODENOSCOPY (EGD) WITH PROPOFOL;  Surgeon: Virgel Manifold, MD;  Location: [facility] ENDOSCOPY;  Service: Endoscopy;  Laterality: N/A;  . ESOPHAGOGASTRODUODENOSCOPY (EGD) WITH PROPOFOL N/A 04/19/2018   Procedure: ESOPHAGOGASTRODUODENOSCOPY (EGD) WITH PROPOFOL with Gastric Mapping;  Surgeon: [name], MD;  Location: [facility] ENDOSCOPY;  Service: Gastroenterology;  Laterality: N/A;  . ESOPHAGOGASTRODUODENOSCOPY (EGD) WITH PROPOFOL N/A 12/13/2018   Procedure: ESOPHAGOGASTRODUODENOSCOPY (EGD) WITH PROPOFOL;  Surgeon: [name], MD;  Location: [facility] ENDOSCOPY;  Service: Gastroenterology;  Laterality: N/A;  . ESOPHAGOGASTRODUODENOSCOPY (EGD) WITH PROPOFOL N/A 03/31/2019   Procedure: ESOPHAGOGASTRODUODENOSCOPY (EGD) WITH PROPOFOL;  Surgeon: Lin Landsman, MD;  Location: [facility] ENDOSCOPY;  Service: Gastroenterology;  Laterality: N/A;  . IMPLANTABLE CARDIOVERTER DEFIBRILLATOR (ICD) GENERATOR CHANGE Left 02/21/2020   Procedure: ICD GENERATOR CHANGE;  Surgeon: Isaias Cowman, MD;  Location: [facility] ORS;  Service: Cardiovascular;  Laterality: Left;  Marland Kitchen VIDEO BRONCHOSCOPY WITH ENDOBRONCHIAL NAVIGATION N/A 06/22/2020   Procedure: VIDEO BRONCHOSCOPY WITH ENDOBRONCHIAL NAVIGATION;  Surgeon: Ottie Glazier, MD;  Location: [facility] ORS;  Service: Thoracic;  Laterality: N/A;  . VIDEO BRONCHOSCOPY WITH ENDOBRONCHIAL ULTRASOUND N/A 06/22/2020    Procedure: VIDEO BRONCHOSCOPY WITH ENDOBRONCHIAL ULTRASOUND;  Surgeon: Ottie Glazier, MD;  Location: [facility] ORS;  Service: Thoracic;  Laterality: N/A;   Family History  Problem Relation Age of Onset  . Diabetes Mother   . Heart attack Father 40   Social History   Tobacco Use  . Smoking status: Current Every Day Smoker    Packs/day: 0.25    Years: 40.00    Pack years: 10.00    Types: Cigarettes  . Smokeless tobacco: Never Used  Substance Use Topics  . Alcohol use: No   No Known Allergies  Prior to Admission medications   Medication Sig Start Date End Date Taking? Authorizing Provider  aspirin 81 MG EC tablet Take 81 mg by mouth daily.   Yes [provider]  atorvastatin (LIPITOR) 40 MG tablet TAKE 1 TABLET (40 MG TOTAL) BY MOUTH DAILY. Patient taking differently: Take 40 mg by mouth daily. 03/08/18  Yes Cannady, Jolene T, NP  Blood Glucose Monitoring Suppl (ACCU-CHEK GUIDE ME) w/Device KIT  02/23/20  Yes [provider]  budesonide-formoterol (SYMBICORT) 160-4.5 MCG/ACT inhaler Inhale 2 puffs into the lungs 2 (two) times daily.   Yes [provider]  buPROPion (WELLBUTRIN) 75 MG tablet Take 75 mg by mouth daily. 03/26/16 02/14/21 Yes [provider]  clopidogrel (PLAVIX) 75 MG tablet TAKE 1 TABLET (75 MG TOTAL) BY MOUTH DAILY. 12/08/16  Yes Kathrine Haddock, NP  doxycycline (VIBRA-TABS) 100 MG tablet Take 1 tablet (100 mg total) by mouth 2 (two) times daily for 7 days. 08/27/20 09/03/20 Yes Vladimir Crofts, MD  empagliflozin (JARDIANCE) 10 MG TABS tablet Take 10 mg by mouth daily with breakfast.   Yes [provider]  furosemide (LASIX) 40 MG tablet Take 40 mg by mouth.   Yes [provider]  furosemide (LASIX) 40 MG tablet Take 1 tablet (40 mg total) by mouth daily. 08/27/20 08/27/21 Yes Vladimir Crofts, MD  glimepiride (AMARYL) 4 MG tablet Take 4 mg by mouth daily with breakfast. 02/23/20 02/22/21 Yes [provider]   ipratropium-albuterol (DUONEB) 0.5-2.5 (3) MG/3ML SOLN Take 3 mLs by nebulization in the morning, at noon, and at bedtime.   Yes [provider]  levocetirizine (XYZAL) 5 MG tablet Take 5 mg by mouth every evening. 11/10/18  Yes [provider]  metFORMIN (GLUCOPHAGE) 1000 MG tablet TAKE 1 TABLET (1,000 MG TOTAL) BY MOUTH 2 (TWO) TIMES DAILY WITH A MEAL. Patient taking differently: Take 1,000 mg by mouth in the morning and at bedtime. 09/12/16  Yes Kathrine Haddock, NP  metoprolol succinate (TOPROL-XL) 100 MG 24 hr tablet TAKE 1 TABLET BY MOUTH EVERY DAY 07/15/20  Yes Darylene Price A, FNP  ondansetron (ZOFRAN ODT) 4 MG disintegrating tablet Take 1 tablet (4 mg total) by mouth every 8 (eight) hours as needed. 09/07/19  Yes Alfred Levins, [place], MD  ONE [facility] ULTRA TEST test strip USE TO TEST BLOOD SUGAR ONCE DAILY 10/07/16  Yes Johnson, Megan P, DO  predniSONE (DELTASONE) 50 MG tablet Take 1 tablet (50 mg total) by mouth daily. 08/27/20  Yes Vladimir Crofts, MD  promethazine (PHENERGAN) 25 MG tablet Take 1 tablet (25 mg total) by mouth every 6 (six) hours as needed for nausea or vomiting. 07/18/20  Yes Chrystal, Eulas Post, MD  spironolactone (ALDACTONE) 25 MG tablet TAKE 1/2 TABLET BY MOUTH EVERY DAY Patient taking differently: Take 12.5 mg by mouth daily. 05/02/19  Yes Darylene Price A, FNP  sucralfate (CARAFATE) 1 g tablet Take 1 g by mouth 3 (three) times daily before meals. 05/15/17 02/14/21 Yes [provider]  ENTRESTO 49-51 MG TAKE 1 TABLET BY MOUTH TWICE A DAY Patient not taking: Reported on 08/28/2020 06/21/19   Alisa Graff, FNP  lansoprazole (PREVACID) 15 MG capsule Take 1 capsule (15 mg total) by mouth daily. 07/18/20   Noreene Filbert, MD  pantoprazole (PROTONIX) 40 MG tablet Take 1 tablet (40 mg total) by mouth 2 (two) times daily before a meal. Patient not taking: Reported on 08/28/2020 12/13/18 02/14/21  Lin Landsman, MD  Propylene Glycol (SYSTANE BALANCE) 0.6 % SOLN Apply to  eye. Patient not taking: Reported on 08/28/2020    [provider]  TRULICITY 1.5 PY/1.9JK SOPN Inject 0.5 mg into the skin every Sunday. Patient not taking: Reported on 08/28/2020 08/30/19   [provider]    Review of Systems  Constitutional: Negative for appetite change, fatigue and fever.  HENT: Negative for congestion, postnasal drip and sore throat.   Eyes: Negative.   Respiratory: Positive for cough and shortness of breath. Negative for chest tightness and wheezing.   Cardiovascular: Negative for chest pain, palpitations and leg swelling.  Gastrointestinal: Negative for abdominal distention and abdominal pain.  Endocrine: Negative.   Genitourinary: Negative.   Musculoskeletal: Negative for back pain and neck pain.  Skin: Negative.   Allergic/Immunologic: Negative.   Neurological: Negative for dizziness, light-headedness and headaches.  Hematological: Negative for adenopathy. Does not bruise/bleed easily.  Psychiatric/Behavioral: Negative for dysphoric mood and sleep disturbance (wearing CPAP; sleeping on 2 pillows). The patient is not nervous/anxious.    Vitals:   08/28/20 0853  BP: 111/70  Pulse: (!) 104  Resp: 18  SpO2: 95%  Weight: 139 lb 6 oz (63.2 kg)  Height: '5\' 4"'  (1.626 m)   Wt Readings from Last 3 Encounters:  08/28/20 139 lb 6 oz (63.2 kg)  08/27/20 141 lb (64 kg)  06/30/20 136 lb 11 oz (62 kg)   Lab Results  Component Value Date   CREATININE 0.68 08/27/2020   CREATININE 0.71 08/01/2020   CREATININE 0.90 06/30/2020    Physical Exam Vitals and nursing note reviewed.  Constitutional:      Appearance: He is well-developed.  HENT:     Head: Normocephalic and atraumatic.  Neck:     Vascular: No JVD.  Cardiovascular:     Rate and Rhythm: Regular rhythm. Tachycardia present.  Pulmonary:     Effort: Pulmonary effort is normal. No respiratory distress.     Breath sounds: No wheezing, rhonchi or rales.  Abdominal:     General: There is  no distension.     Palpations: Abdomen is soft.     Tenderness: There is no abdominal tenderness.  Musculoskeletal:        General: No tenderness.     Cervical back: Normal range of motion and neck supple.  Skin:    General: Skin is warm and dry.  Neurological:     Mental Status: He is alert and oriented to person, place, and time.  Psychiatric:        Behavior: Behavior normal.        Thought Content: Thought content normal.    Assessment & Plan:  1: Chronic heart failure with reduced ejection  fraction- - NYHA class II - euvolemic today - not weighing daily but does have scales; encouraged to resume daily weight so that he can call for an overnight weight gain of >2 pounds or a weekly weight gain of >5 pounds.  - weight up 11 pounds since last visit 6 months ago - not adding salt; using Mrs. Dash; admits that he's not reading food labels; reminded him to keep his daily sodium intake to <2060m a day. Does eat quite a bit of processed food and carbs; rinses canned vegetables  - saw cardiology (Dema Severin 07/20/20 - was without his diuretic for an unknown reason; admits that he doesn't like taking it because of increased urination; explained the importance of taking it daily and not running out of it  - patient is unsure if he's taking entresto as he didn't bring his medications with him and he can't recall the names - tachycardic today but may be due to solumedrol given yesterday and the oral prednisone earlier today - BNP 08/27/20 was 822.9 - currently has pacemaker/defibrillator  2: HTN- - BP looks good today - saw PCP ([place]) 07/02/20  - BMP 08/27/20 reviewed and showed sodium 138, potassium 3.6, creatinine 0.68 and GFR >60  3: Diabetes- - saw endocrinology (Honor Junes 02/23/20 - A1c 06/07/20 was 8.0  4: COPD/ Tobacco use- - saw pulmonologist (Raul Del 08/17/20 - currently smoking 4-5 cigarettes daily; more if he's stressed or upset - complete cessation discussed for 3 minutes with  patient, patient not interested in stopping smoking at this time.  - started prednisone/ doxycycline this morning; advised to make sure he takes them all up and not stop them when he starts to feel better - had been using symbicort PRN, explained that he should use this daily and his albuterol PRN   Patient did not bring his medications nor a list. Each medication was verbally reviewed with the patient and he was encouraged to bring the bottles to every visit to confirm accuracy of list.  Return in 3 weeks or sooner for any questions/problems before then.

## 2020-08-27 NOTE — ED Notes (Signed)
Patient transported to X-ray 

## 2020-08-27 NOTE — ED Notes (Signed)
Patient ambulated to and from restroom, patient oxygen 90% on RA post ambulation.

## 2020-08-27 NOTE — ED Triage Notes (Signed)
Pt c/o SOB with weakness over the past 3-4 days.

## 2020-08-27 NOTE — ED Provider Notes (Signed)
[facility] Emergency Department Provider Note ____________________________________________   Event Date/Time   First MD Initiated Contact with Patient 08/27/20 1124     (approximate)  I have reviewed the triage vital signs and the nursing notes.  HISTORY  Chief Complaint Shortness of Breath   HPI John Frank is a 65 y.o. malewho presents to the ED for evaluation of shortness of breath.  Chart review indicates history of CAD s/p stenting to RCA.  AICD in place.  COPD, CHF EF 40%, HTN, HLD.  DM on multiple oral agents.  GERD.  Known RLL nodule s/p radiation and follows with oncology. Patient reports that he has had no fluid pills for the past couple months and cannot elaborate why.  He reports he must of run out.  Denies discontinuation of this medication from cardiology or other provider.  Patient presents to the ED for evaluation of 3 days of progressively worsening shortness of breath, productive cough and generalized weakness.  He reports increased sensation of lower extremity swelling and new sock lines with the same timeframe.  Denies fevers, syncopal episodes, bowel pain, vomiting or stool changes.  He does report increased pedal production, green in color.  Reports a dizziness spell this morning where he almost syncopized after a coughing fit, so he presents to the ED for evaluation.   Past Medical History:  Diagnosis Date  . AICD (automatic cardioverter/defibrillator) present   . Angina at rest [facility])   . Aortic atherosclerosis ([place])   . CAD (coronary artery disease)    s/p stents in 2017  . Chronic systolic CHF (congestive heart failure) ([place])   . Cirrhosis of liver ([place])   . COPD (chronic obstructive pulmonary disease) ([place])   . Diabetes ([place])   . GERD (gastroesophageal reflux disease)   . Heart attack ([place])   . Hyperlipidemia   . Hypertension   . Ischemic cardiomyopathy   . Paroxysmal ventricular tachycardia ([place])   . Right lower lobe  lung mass   . Shortness of breath dyspnea   . Sleep apnea     Patient Active Problem List   Diagnosis Date Noted  . Pain due to onychomycosis of toenails of both feet 03/15/2020  . Ulcer of esophagus without bleeding   . Gastritis with intestinal metaplasia of stomach   . COPD with acute exacerbation ([place]) 05/25/2018  . Iron deficiency anemia   . Cirrhosis of liver with ascites ([place])   . Chronic systolic heart failure ([place]) 08/31/2017  . CHF exacerbation ([place]) 08/09/2017  . Syncope 01/11/2016  . Tobacco abuse 04/06/2015  . HTN (hypertension) 11/03/2014  . Sleep apnea 10/26/2014  . CAD (coronary artery disease) 10/26/2014  . GERD (gastroesophageal reflux disease) 10/26/2014  . Polyp of colon 10/26/2014  . Diabetes mellitus without complication ([place]) 10/96/0454  . Hyperlipidemia 10/26/2014  . COPD, severe ([place]) 10/26/2014  . Status cardiac pacemaker 10/26/2014  . Lesion of nasal septum 10/26/2014    Past Surgical History:  Procedure Laterality Date  . CARDIAC PACEMAKER PLACEMENT    . cardiac stents    . CARDIAC SURGERY     defibrillator and pacemaker  . CORONARY ANGIOPLASTY    . ESOPHAGOGASTRODUODENOSCOPY N/A 09/19/2014   Procedure: ESOPHAGOGASTRODUODENOSCOPY (EGD);  Surgeon: Lucilla Lame, MD;  Location: [facility] ENDOSCOPY;  Service: Endoscopy;  Laterality: N/A;  . ESOPHAGOGASTRODUODENOSCOPY (EGD) WITH PROPOFOL N/A 09/28/2017   Procedure: ESOPHAGOGASTRODUODENOSCOPY (EGD) WITH PROPOFOL;  Surgeon: Virgel Manifold, MD;  Location: [facility] ENDOSCOPY;  Service: Endoscopy;  Laterality: N/A;  .  ESOPHAGOGASTRODUODENOSCOPY (EGD) WITH PROPOFOL N/A 04/19/2018   Procedure: ESOPHAGOGASTRODUODENOSCOPY (EGD) WITH PROPOFOL with Gastric Mapping;  Surgeon: [name], MD;  Location: [facility] ENDOSCOPY;  Service: Gastroenterology;  Laterality: N/A;  . ESOPHAGOGASTRODUODENOSCOPY (EGD) WITH PROPOFOL N/A 12/13/2018   Procedure: ESOPHAGOGASTRODUODENOSCOPY (EGD) WITH PROPOFOL;  Surgeon: [name], MD;   Location: [facility] ENDOSCOPY;  Service: Gastroenterology;  Laterality: N/A;  . ESOPHAGOGASTRODUODENOSCOPY (EGD) WITH PROPOFOL N/A 03/31/2019   Procedure: ESOPHAGOGASTRODUODENOSCOPY (EGD) WITH PROPOFOL;  Surgeon: Lin Landsman, MD;  Location: [facility] ENDOSCOPY;  Service: Gastroenterology;  Laterality: N/A;  . IMPLANTABLE CARDIOVERTER DEFIBRILLATOR (ICD) GENERATOR CHANGE Left 02/21/2020   Procedure: ICD GENERATOR CHANGE;  Surgeon: Isaias Cowman, MD;  Location: [facility] ORS;  Service: Cardiovascular;  Laterality: Left;  Marland Kitchen VIDEO BRONCHOSCOPY WITH ENDOBRONCHIAL NAVIGATION N/A 06/22/2020   Procedure: VIDEO BRONCHOSCOPY WITH ENDOBRONCHIAL NAVIGATION;  Surgeon: Ottie Glazier, MD;  Location: [facility] ORS;  Service: Thoracic;  Laterality: N/A;  . VIDEO BRONCHOSCOPY WITH ENDOBRONCHIAL ULTRASOUND N/A 06/22/2020   Procedure: VIDEO BRONCHOSCOPY WITH ENDOBRONCHIAL ULTRASOUND;  Surgeon: Ottie Glazier, MD;  Location: [facility] ORS;  Service: Thoracic;  Laterality: N/A;    Prior to Admission medications   Medication Sig Start Date End Date Taking? Authorizing Provider  doxycycline (VIBRA-TABS) 100 MG tablet Take 1 tablet (100 mg total) by mouth 2 (two) times daily for 7 days. 08/27/20 09/03/20 Yes Vladimir Crofts, MD  furosemide (LASIX) 40 MG tablet Take 1 tablet (40 mg total) by mouth daily. 08/27/20 08/27/21 Yes Vladimir Crofts, MD  predniSONE (DELTASONE) 50 MG tablet Take 1 tablet (50 mg total) by mouth daily. 08/27/20  Yes Vladimir Crofts, MD  aspirin 81 MG EC tablet Take 81 mg by mouth daily.    [provider]  atorvastatin (LIPITOR) 40 MG tablet TAKE 1 TABLET (40 MG TOTAL) BY MOUTH DAILY. Patient taking differently: Take 40 mg by mouth daily. 03/08/18   Cannady, Henrine Screws T, NP  Blood Glucose Monitoring Suppl (ACCU-CHEK GUIDE ME) w/Device KIT  02/23/20   [provider]  budesonide-formoterol (SYMBICORT) 160-4.5 MCG/ACT inhaler Inhale 2 puffs into the lungs 2 (two) times daily.    [provider]   buPROPion (WELLBUTRIN) 75 MG tablet Take 75 mg by mouth daily. 03/26/16 02/14/21  [provider]  clopidogrel (PLAVIX) 75 MG tablet TAKE 1 TABLET (75 MG TOTAL) BY MOUTH DAILY. 12/08/16   Kathrine Haddock, NP  empagliflozin (JARDIANCE) 10 MG TABS tablet Take 10 mg by mouth daily with breakfast.    [provider]  ENTRESTO 49-51 MG TAKE 1 TABLET BY MOUTH TWICE A DAY Patient taking differently: Take 1 tablet by mouth 2 (two) times daily. 06/21/19   Alisa Graff, FNP  furosemide (LASIX) 40 MG tablet Take 40 mg by mouth.    [provider]  glimepiride (AMARYL) 4 MG tablet Take 4 mg by mouth daily with breakfast. 02/23/20 02/22/21  [provider]  ipratropium-albuterol (DUONEB) 0.5-2.5 (3) MG/3ML SOLN Take 3 mLs by nebulization in the morning, at noon, and at bedtime.    [provider]  lansoprazole (PREVACID) 15 MG capsule Take 1 capsule (15 mg total) by mouth daily. 07/18/20   Noreene Filbert, MD  levocetirizine (XYZAL) 5 MG tablet Take 5 mg by mouth every evening. 11/10/18   [provider]  metFORMIN (GLUCOPHAGE) 1000 MG tablet TAKE 1 TABLET (1,000 MG TOTAL) BY MOUTH 2 (TWO) TIMES DAILY WITH A MEAL. Patient taking differently: Take 1,000 mg by mouth in the morning and at bedtime. 09/12/16   Kathrine Haddock, NP  metoprolol succinate (TOPROL-XL) 100 MG 24 hr tablet TAKE 1 TABLET BY MOUTH EVERY DAY 07/15/20   Darylene Price A, FNP  ondansetron (ZOFRAN ODT) 4 MG disintegrating tablet Take 1 tablet (4 mg total) by mouth every 8 (eight) hours as needed. 09/07/19   Rudene Re, MD  ONE [facility] ULTRA TEST test strip USE TO TEST BLOOD SUGAR ONCE DAILY 10/07/16   Park Liter P, DO  pantoprazole (PROTONIX) 40 MG tablet Take 1 tablet (40 mg total) by mouth 2 (two) times daily before a meal. 12/13/18 02/14/21  Vanga, Tally Due, MD  promethazine (PHENERGAN) 25 MG tablet Take 1 tablet (25 mg total) by mouth every 6 (six) hours as needed for nausea or vomiting.  07/18/20   Chrystal, Eulas Post, MD  Propylene Glycol (SYSTANE BALANCE) 0.6 % SOLN Apply to eye.    [provider]  spironolactone (ALDACTONE) 25 MG tablet TAKE 1/2 TABLET BY MOUTH EVERY DAY Patient taking differently: Take 12.5 mg by mouth daily. 05/02/19   Alisa Graff, FNP  sucralfate (CARAFATE) 1 g tablet Take 1 g by mouth 3 (three) times daily before meals. 05/15/17 02/14/21  [provider]  TRULICITY 1.5 CH/8.8FO SOPN Inject 0.5 mg into the skin every Sunday. 08/30/19   [provider]    Allergies Patient has no known allergies.  Family History  Problem Relation Age of Onset  . Diabetes Mother   . Heart attack Father 36    Social History Social History   Tobacco Use  . Smoking status: Current Every Day Smoker    Packs/day: 0.25    Years: 40.00    Pack years: 10.00    Types: Cigarettes  . Smokeless tobacco: Never Used  Vaping Use  . Vaping Use: Never used  Substance Use Topics  . Alcohol use: No  . Drug use: Never    Review of Systems  Constitutional: No fever/chills.  Positive generalized weakness.  Positive for presyncope. Eyes: No visual changes. ENT: No sore throat. Cardiovascular: Denies chest pain. Respiratory: Positive shortness of breath and productive cough. Gastrointestinal: No abdominal pain.  No nausea, no vomiting.  No diarrhea.  No constipation. Genitourinary: Negative for dysuria. Musculoskeletal: Negative for back pain. Skin: Negative for rash. Neurological: Negative for headaches, focal weakness or numbness.  ____________________________________________   PHYSICAL EXAM:  VITAL SIGNS: Vitals:   08/27/20 1221 08/27/20 1328  BP: 115/81 116/75  Pulse: 94 (!) 102  Resp: (!) 22 18  Temp:  (!) 97.4 F (36.3 C)  SpO2: 94% 93%     Constitutional: Alert and oriented.  Sitting upright in bed.  Pink puffer morphology.  Eyes: Conjunctivae are normal. PERRL. EOMI. Head: Atraumatic. Nose: No  congestion/rhinnorhea. Mouth/Throat: Mucous membranes are moist.  Oropharynx non-erythematous. Neck: No stridor. No cervical spine tenderness to palpation. Cardiovascular: Tachycardic rate, regular rhythm. Grossly normal heart sounds.  Good peripheral circulation. Respiratory: Tachypnea to the mid 20s.  No retractions or further evidence of distress.  Diffuse expiratory wheezes with poor air movement throughout.  No focal features. Gastrointestinal: Soft , nondistended, nontender to palpation. No CVA tenderness. Musculoskeletal: No lower extremity tenderness.  No joint effusions. No signs of acute trauma. Mild and symmetric pitting edema to bilateral lower extremities with preservation of sock lines.  No overlying skin changes or signs of trauma. Neurologic:  Normal speech and language. No gross focal neurologic deficits are appreciated. No gait instability noted. Skin:  Skin is warm, dry and intact. No rash noted. Psychiatric: Mood and affect are normal.  Speech and behavior are normal.  ____________________________________________   LABS (all labs ordered are listed, but only abnormal results are displayed)  Labs Reviewed  COMPREHENSIVE METABOLIC PANEL - Abnormal; Notable for the following components:      Result Value   Glucose, Bld 227 (*)    All other components within normal limits  CBC WITH DIFFERENTIAL/PLATELET - Abnormal; Notable for the following components:   RBC 3.91 (*)    Hemoglobin 11.7 (*)    HCT 36.0 (*)    RDW 18.0 (*)    All other components within normal limits  BRAIN NATRIURETIC PEPTIDE - Abnormal; Notable for the following components:   B Natriuretic Peptide 822.9 (*)    All other components within normal limits  MAGNESIUM  TROPONIN I (HIGH SENSITIVITY)  TROPONIN I (HIGH SENSITIVITY)   ____________________________________________  12 Lead EKG  Sinus rhythm, rate of 104 bpm.  Normal axis and normal intervals.  Couple PVCs.  No  STEMI. ____________________________________________  RADIOLOGY  ED MD interpretation: 2 view CXR reviewed by me with bilateral perihilar streaky opacities.  Official radiology report(s): DG Chest 2 View  Result Date: 08/27/2020 CLINICAL DATA:  Shortness of breath and weakness for several days. EXAM: CHEST - 2 VIEW COMPARISON:  08/01/2020 FINDINGS: The heart size and mediastinal contours are within normal limits. Dual lead transvenous pacemaker remains in appropriate position. Increased central peribronchial thickening is noted. New linear opacity in the left perihilar region is consistent with mild subsegmental atelectasis. No evidence of pulmonary consolidation or pleural effusion. Several old right rib fracture deformities are again noted. IMPRESSION: New central peribronchial thickening and mild left perihilar subsegmental atelectasis. No evidence of pulmonary consolidation or pleural effusion. Electronically Signed   By: Marlaine Hind M.D.   On: 08/27/2020 11:51    ____________________________________________   PROCEDURES and INTERVENTIONS  Procedure(s) performed (including Critical Care):  .1-3 Lead EKG Interpretation Performed by: Vladimir Crofts, MD Authorized by: Vladimir Crofts, MD     Interpretation: abnormal     ECG rate:  104   ECG rate assessment: tachycardic     Rhythm: sinus tachycardia     Ectopy: none     Conduction: normal      Medications  ipratropium-albuterol (DUONEB) 0.5-2.5 (3) MG/3ML nebulizer solution 6 mL (6 mLs Nebulization Given 08/27/20 1150)  methylPREDNISolone sodium succinate (SOLU-MEDROL) 125 mg/2 mL injection 125 mg (125 mg Intravenous Given 08/27/20 1149)  doxycycline (VIBRA-TABS) tablet 100 mg (100 mg Oral Given 08/27/20 1149)  furosemide (LASIX) injection 40 mg (40 mg Intravenous Given 08/27/20 1226)    ____________________________________________   MDM / ED COURSE   65 year old male with history of CHF and COPD, but has recently been without his  Lasix, presents to the ED with evidence of CHF and COPD exacerbations, and ultimately amenable to outpatient management.  Presents tachycardic, but stable and without evidence of hypoxia.  Exam with some evidence of mild volume overload, wheezing and poor air movement, but no distress or neurologic or vascular deficits.  EKG is nonischemic and his high-sensitivity troponin is negative.  BNP is elevated, and with his volume overload, and lack of Lasix, is most consistent with CHF exacerbation.  Provided single dose of IV Lasix, duo nebs and steroids with improvement of his symptoms.  Started the patient on a course of doxycycline due to his increased pain production and perihilar opacities on his chest x-ray.  I discussed with him the importance of staying on Lasix due to his decreased ejection fraction.  He ultimately  demands discharge, despite his tachycardia and persistent wheezing.  Provided prescriptions for Lasix, doxycycline and prednisone.  We discussed return precautions for the ED prior to discharge.   Clinical Course as of 08/27/20 1421  Mon Aug 27, 2020  1318 Reassessed.  Patient reports improved symptoms.  Improved airflow on auscultation, but remains tachycardic.  I discussed my recommendations for observation admission, but patient declines and indicates that he wants to go home.  He reports that he is unwilling to stay.  We discussed that he has inhalers at home, but addition of doxycycline, steroids and Lasix to his regimen.  He is agreeable.  We discussed return precautions for the ED. [DS]    Clinical Course User Index [DS] Vladimir Crofts, MD    ____________________________________________   FINAL CLINICAL IMPRESSION(S) / ED DIAGNOSES  Final diagnoses:  COPD exacerbation ([place])  Acute on chronic systolic congestive heart failure (HCC)  Shortness of breath     ED Discharge Orders         Ordered    furosemide (LASIX) 40 MG tablet  Daily        08/27/20 1320    doxycycline  (VIBRA-TABS) 100 MG tablet  2 times daily        08/27/20 1320    predniSONE (DELTASONE) 50 MG tablet  Daily        08/27/20 1320           Elinore Shults   Note:  This document was prepared using Systems analyst and may include unintentional dictation errors.   Vladimir Crofts, MD 08/27/20 [phone]

## 2020-08-27 NOTE — Discharge Instructions (Signed)
Will be discharged with 3 prescriptions. Doxycycline antibiotics to take twice daily for the next 7 days. Prednisone steroids to take once daily for the next 4 days.  Start this tomorrow, we will give you the steroids for today.  Lasix fluid pill to use daily every day moving forward.  Follow-up with your PCP to get refills for the Lasix.    Return to the ED with any worsening symptoms despite these medications.

## 2020-08-27 NOTE — ED Notes (Signed)
Dr. Smith at bedside.

## 2020-08-28 ENCOUNTER — Ambulatory Visit: Payer: Medicare Other | Attending: Family | Admitting: Family

## 2020-08-28 ENCOUNTER — Encounter: Payer: Self-pay | Admitting: Family

## 2020-08-28 VITALS — BP 111/70 | HR 104 | Resp 18 | Ht 64.0 in | Wt 139.4 lb

## 2020-08-28 DIAGNOSIS — Z7982 Long term (current) use of aspirin: Secondary | ICD-10-CM | POA: Insufficient documentation

## 2020-08-28 DIAGNOSIS — E785 Hyperlipidemia, unspecified: Secondary | ICD-10-CM | POA: Insufficient documentation

## 2020-08-28 DIAGNOSIS — I2510 Atherosclerotic heart disease of native coronary artery without angina pectoris: Secondary | ICD-10-CM | POA: Insufficient documentation

## 2020-08-28 DIAGNOSIS — F17210 Nicotine dependence, cigarettes, uncomplicated: Secondary | ICD-10-CM | POA: Diagnosis not present

## 2020-08-28 DIAGNOSIS — Z7984 Long term (current) use of oral hypoglycemic drugs: Secondary | ICD-10-CM | POA: Diagnosis not present

## 2020-08-28 DIAGNOSIS — Z955 Presence of coronary angioplasty implant and graft: Secondary | ICD-10-CM | POA: Diagnosis not present

## 2020-08-28 DIAGNOSIS — Z7951 Long term (current) use of inhaled steroids: Secondary | ICD-10-CM | POA: Diagnosis not present

## 2020-08-28 DIAGNOSIS — I10 Essential (primary) hypertension: Secondary | ICD-10-CM

## 2020-08-28 DIAGNOSIS — I5022 Chronic systolic (congestive) heart failure: Secondary | ICD-10-CM | POA: Diagnosis present

## 2020-08-28 DIAGNOSIS — Z833 Family history of diabetes mellitus: Secondary | ICD-10-CM | POA: Insufficient documentation

## 2020-08-28 DIAGNOSIS — J449 Chronic obstructive pulmonary disease, unspecified: Secondary | ICD-10-CM

## 2020-08-28 DIAGNOSIS — K219 Gastro-esophageal reflux disease without esophagitis: Secondary | ICD-10-CM | POA: Insufficient documentation

## 2020-08-28 DIAGNOSIS — E119 Type 2 diabetes mellitus without complications: Secondary | ICD-10-CM

## 2020-08-28 DIAGNOSIS — Z8249 Family history of ischemic heart disease and other diseases of the circulatory system: Secondary | ICD-10-CM | POA: Diagnosis not present

## 2020-08-28 DIAGNOSIS — Z95810 Presence of automatic (implantable) cardiac defibrillator: Secondary | ICD-10-CM | POA: Insufficient documentation

## 2020-08-28 DIAGNOSIS — I110 Hypertensive heart disease with heart failure: Secondary | ICD-10-CM | POA: Diagnosis not present

## 2020-08-28 DIAGNOSIS — Z79899 Other long term (current) drug therapy: Secondary | ICD-10-CM | POA: Diagnosis not present

## 2020-08-28 NOTE — Patient Instructions (Addendum)
Resume weighing daily and call for an overnight weight gain of > 2 pounds or a weekly weight gain of >5 pounds.   Use your symbicort every day.

## 2020-08-28 NOTE — Progress Notes (Signed)
[facility] - PHARMACIST COUNSELING NOTE  ADHERENCE ASSESSMENT  Adherence strategy: pill box   Do you ever forget to take your medication? '[x]' Yes (1) '[]' No (0)  Do you ever skip doses due to side effects? '[x]' Yes (1) '[]' No (0)  Do you have trouble affording your medicines? '[]' Yes (1) '[x]' No (0)  Are you ever unable to pick up your medication due to transportation difficulties? '[]' Yes (1) '[x]' No (0)  Do you ever stop taking your medications because you don't believe they are helping? '[]' Yes (1) '[x]' No (0)  Total score _2______    Recommendations given to patient about increasing adherence: Went through medication list to discuss importance of each medication.   Guideline-Directed Medical Therapy/Evidence Based Medicine  ACE/ARB/ARNI: Entresto 49-51 mg twice daily, however, pt is unfamiliar with this medication, unsure if pt is taking Beta Blocker: metoprolol 100 mg daily  Aldosterone Antagonist: spironolactone 12.5 mg daily  Diuretic: furosemide 40 mg daily    SUBJECTIVE  HPI:  Past Medical History:  Diagnosis Date  . AICD (automatic cardioverter/defibrillator) present   . [facility])   . Aortic atherosclerosis ([place])   . CAD (coronary artery disease)    s/p stents in 2017  . Chronic systolic CHF (congestive heart failure) ([place])   . Cirrhosis of liver ([place])   . COPD (chronic obstructive pulmonary disease) ([place])   . Diabetes ([place])   . GERD (gastroesophageal reflux disease)   . Heart attack ([place])   . Hyperlipidemia   . Hypertension   . Ischemic cardiomyopathy   . Paroxysmal ventricular tachycardia ([place])   . Right lower lobe lung mass   . Shortness of breath dyspnea   . Sleep apnea         OBJECTIVE   Vital signs: HR 104, BP 111/70, weight 63.2 kg  ECHO: Date 11/19/18, EF 40-45%  BMP Latest Ref Rng & Units 08/27/2020 08/01/2020 06/30/2020  Glucose 70 - 99 mg/dL 227(H) 161(H) 230(H)  BUN 8 - 23 mg/dL '9 10 20  ' Creatinine  0.61 - 1.24 mg/dL 0.68 0.71 0.90  BUN/Creat Ratio 10 - 24 - - -  Sodium 135 - 145 mmol/L 138 134(L) 136  Potassium 3.5 - 5.1 mmol/L 3.6 3.9 3.6  Chloride 98 - 111 mmol/L 101 102 97(L)  CO2 22 - 32 mmol/L '28 24 26  ' Calcium 8.9 - 10.3 mg/dL 9.0 9.0 8.9    ASSESSMENT 65 yo M presented to heart failure clinic for follow-up. Pt had ED visit 4/18 related to likely volume overload +/- COPD exacerbation. Pt is taking furosemide, prednisone, and doxycycline after ED visit. PMH includes diabetes, CHF, HTN, OSA, HLD, heart attack, GERD, COPD, cirrhosis, and CAD. Pt does not appear to be adherent with medication regimen throughout medication reconciliation.  PLAN CHF/HTN - Continue Entresto 49-51 mg twice daily  - Continue furosemide 40 mg daily  - Continue metoprolol succinate 100 mg daily  - Continue spironolactone 12.5 mg daily - Continue empagliflozin 10 mg daily   Hx of heart attack - Continue aspirin 81 mg daily - Continue atorvastatin 40 mg daily - Continue clopidogrel 75 mg daily  - Continue Entresto 49-51 mg twice daily  - Continue metoprolol succinate 100 mg daily   Diabetes/HLD/CAD - Continue empagliflozin 10 mg daily  - Continue glimepiride 4 mg daily  - Continue metformin 1000 mg twice daily - Pt reports GI upset with Trulicity. Recommended pt take ondansetron or promethazine to help with nausea from Trulicity. Encouraged pt  to continue trying injections if able as GI side effects will improve with use. Pt may need to follow-up with PCP regarding Trulicity if inability to tolerate continues.  - Pt does not monitor blood glucose at home. Recommend monitoring blood glucose to determine levels/trend throughout the day  GERD - Pt is unsure if taking both lansoprazole 15 mg daily and pantoprazole 40 mg twice daily. Recommended pt look through medications to determine if he has both medications on hand and reaching out to PCP to determine which agent should be continued if so.    COPD - Pt reports taking Symbicort as needed. Pt may benefit from scheduled inhaler use with as-needed rescue inhaler. Pt reports using Duoneb ~6x/day. - Continue doxycycline 100 mg twice daily x 7 days  - Continue prednisone 50 mg daily x 4 days  General Health - Continue taking bupropion 75 mg daily  - Continue levocetirizine 5 mg daily  - Continue ondansetron 4 mg every 8 hours as needed - Continue promethazine 25 mg every 6 hours as needed  - Continue sucralfate 1 g three times daily with meals   Time spent: 15 minutes  Benn Moulder, PharmD Pharmacy Resident  08/28/2020 9:43 AM    Current Outpatient Medications:  .  aspirin 81 MG EC tablet, Take 81 mg by mouth daily., Disp: , Rfl:  .  atorvastatin (LIPITOR) 40 MG tablet, TAKE 1 TABLET (40 MG TOTAL) BY MOUTH DAILY. (Patient taking differently: Take 40 mg by mouth daily.), Disp: 90 tablet, Rfl: 1 .  Blood Glucose Monitoring Suppl (ACCU-CHEK GUIDE ME) w/Device KIT, , Disp: , Rfl:  .  budesonide-formoterol (SYMBICORT) 160-4.5 MCG/ACT inhaler, Inhale 2 puffs into the lungs 2 (two) times daily., Disp: , Rfl:  .  buPROPion (WELLBUTRIN) 75 MG tablet, Take 75 mg by mouth daily., Disp: , Rfl:  .  clopidogrel (PLAVIX) 75 MG tablet, TAKE 1 TABLET (75 MG TOTAL) BY MOUTH DAILY., Disp: 90 tablet, Rfl: 3 .  doxycycline (VIBRA-TABS) 100 MG tablet, Take 1 tablet (100 mg total) by mouth 2 (two) times daily for 7 days., Disp: 14 tablet, Rfl: 0 .  empagliflozin (JARDIANCE) 10 MG TABS tablet, Take 10 mg by mouth daily with breakfast., Disp: , Rfl:  .  ENTRESTO 49-51 MG, TAKE 1 TABLET BY MOUTH TWICE A DAY (Patient taking differently: Take 1 tablet by mouth 2 (two) times daily.), Disp: 60 tablet, Rfl: 5 .  furosemide (LASIX) 40 MG tablet, Take 40 mg by mouth., Disp: , Rfl:  .  furosemide (LASIX) 40 MG tablet, Take 1 tablet (40 mg total) by mouth daily., Disp: 30 tablet, Rfl: 0 .  glimepiride (AMARYL) 4 MG tablet, Take 4 mg by mouth daily with  breakfast., Disp: , Rfl:  .  ipratropium-albuterol (DUONEB) 0.5-2.5 (3) MG/3ML SOLN, Take 3 mLs by nebulization in the morning, at noon, and at bedtime., Disp: , Rfl:  .  lansoprazole (PREVACID) 15 MG capsule, Take 1 capsule (15 mg total) by mouth daily., Disp: 30 capsule, Rfl: 3 .  levocetirizine (XYZAL) 5 MG tablet, Take 5 mg by mouth every evening., Disp: , Rfl:  .  metFORMIN (GLUCOPHAGE) 1000 MG tablet, TAKE 1 TABLET (1,000 MG TOTAL) BY MOUTH 2 (TWO) TIMES DAILY WITH A MEAL. (Patient taking differently: Take 1,000 mg by mouth in the morning and at bedtime.), Disp: 180 tablet, Rfl: 1 .  metoprolol succinate (TOPROL-XL) 100 MG 24 hr tablet, TAKE 1 TABLET BY MOUTH EVERY DAY, Disp: 90 tablet, Rfl: 3 .  ondansetron (ZOFRAN ODT) 4 MG disintegrating tablet, Take 1 tablet (4 mg total) by mouth every 8 (eight) hours as needed., Disp: 20 tablet, Rfl: 0 .  ONE TOUCH ULTRA TEST test strip, USE TO TEST BLOOD SUGAR ONCE DAILY, Disp: 100 each, Rfl: 3 .  pantoprazole (PROTONIX) 40 MG tablet, Take 1 tablet (40 mg total) by mouth 2 (two) times daily before a meal., Disp: 180 tablet, Rfl: 1 .  predniSONE (DELTASONE) 50 MG tablet, Take 1 tablet (50 mg total) by mouth daily., Disp: 4 tablet, Rfl: 0 .  promethazine (PHENERGAN) 25 MG tablet, Take 1 tablet (25 mg total) by mouth every 6 (six) hours as needed for nausea or vomiting., Disp: 30 tablet, Rfl: 0 .  Propylene Glycol (SYSTANE BALANCE) 0.6 % SOLN, Apply to eye., Disp: , Rfl:  .  spironolactone (ALDACTONE) 25 MG tablet, TAKE 1/2 TABLET BY MOUTH EVERY DAY (Patient taking differently: Take 12.5 mg by mouth daily.), Disp: 45 tablet, Rfl: 3 .  sucralfate (CARAFATE) 1 g tablet, Take 1 g by mouth 3 (three) times daily before meals., Disp: , Rfl:  .  TRULICITY 1.5 XN/1.7GY SOPN, Inject 0.5 mg into the skin every Sunday., Disp: , Rfl:    COUNSELING POINTS/CLINICAL PEARLS  Metoprolol Succinate (Goal: 200 mg once daily) Warn patient to avoid activities requiring mental  alertness or coordination until drug effects are realized, as drug may cause dizziness. Tell patient planning major surgery with anesthesia to alert physician that drug is being used, as drug impairs ability of heart to respond to reflex adrenergic stimuli. Drug may cause diarrhea, fatigue, headache, or depression. Advise diabetic patient to carefully monitor blood glucose as drug may mask symptoms of hypoglycemia. Patient should take extended-release tablet with or immediately following meals. Counsel patient against sudden discontinuation of drug, as this may precipitate hypertension, angina, or myocardial infarction. In the event of a missed dose, counsel patient to skip the missed dose and maintain a regular dosing schedule. Entresto (Goal: 97/103 mg twice daily)  Warn male patient to avoid pregnancy during therapy and to report a pregnancy to a physician.  Advise patient to report symptomatic hypotension.  Side effects may include hyperkalemia, cough, dizziness, or renal failure. Furosemide  Drug causes sun-sensitivity. Advise patient to use sunscreen and avoid tanning beds. Patient should avoid activities requiring coordination until drug effects are realized, as drug may cause dizziness, vertigo, or blurred vision. This drug may cause hyperglycemia, hyperuricemia, constipation, diarrhea, loss of appetite, nausea, vomiting, purpuric disorder, cramps, spasticity, asthenia, headache, paresthesia, or scaling eczema. Instruct patient to report unusual bleeding/bruising or signs/symptoms of hypotension, infection, pancreatitis, or ototoxicity (tinnitus, hearing impairment). Advise patient to report signs/symptoms of a severe skin reactions (flu-like symptoms, spreading red rash, or skin/mucous membrane blistering) or erythema multiforme. Instruct patient to eat high-potassium foods during drug therapy, as directed by healthcare professional.  Patient should not drink alcohol while taking this  drug. Spironolactone  Warn patient to report dehydration, hypotension, or symptoms of worsening renal function.  Counsel male patient to report gynecomastia.  Side effects may include diarrhea, nausea, vomiting, abdominal cramping, fever, leg cramps, lethargy, mental confusion, decreased libido, irregular menses, and rash. Suspension: Tell patient to take drug consistently with respect to food, either before or after a meal.  Advise patient to avoid potassium supplements and foods containing high levels of potassium, including salt substitutes.  DRUGS TO AVOID IN HEART FAILURE  Drug or Class Mechanism  Analgesics . NSAIDs . COX-2 inhibitors . Glucocorticoids  Sodium  and water retention, increased systemic vascular resistance, decreased response to diuretics   Diabetes Medications . Metformin . Thiazolidinediones o Rosiglitazone (Avandia) o Pioglitazone (Actos) . DPP4 Inhibitors o Saxagliptin (Onglyza) o Sitagliptin (Januvia)   Lactic acidosis Possible calcium channel blockade   Unknown  Antiarrhythmics . Class I  o Flecainide o Disopyramide . Class III o Sotalol . Other o Dronedarone  Negative inotrope, proarrhythmic   Proarrhythmic, beta blockade  Negative inotrope  Antihypertensives . Alpha Blockers o Doxazosin . Calcium Channel Blockers o Diltiazem o Verapamil o Nifedipine . Central Alpha Adrenergics o Moxonidine . Peripheral Vasodilators o Minoxidil  Increases renin and aldosterone  Negative inotrope    Possible sympathetic withdrawal  Unknown  Anti-infective . Itraconazole . Amphotericin B  Negative inotrope Unknown  Hematologic . Anagrelide . Cilostazol   Possible inhibition of PD IV Inhibition of PD III causing arrhythmias  Neurologic/Psychiatric . Stimulants . Anti-Seizure  Drugs o Carbamazepine o Pregabalin . Antidepressants o Tricyclics o Citalopram . Parkinsons o Bromocriptine o Pergolide o Pramipexole . Antipsychotics o Clozapine . Antimigraine o Ergotamine o Methysergide . Appetite suppressants . Bipolar o Lithium  Peripheral alpha and beta agonist activity  Negative inotrope and chronotrope Calcium channel blockade  Negative inotrope, proarrhythmic Dose-dependent QT prolongation  Excessive serotonin activity/valvular damage Excessive serotonin activity/valvular damage Unknown  IgE mediated hypersensitivy, calcium channel blockade  Excessive serotonin activity/valvular damage Excessive serotonin activity/valvular damage Valvular damage  Direct myofibrillar degeneration, adrenergic stimulation  Antimalarials . Chloroquine . Hydroxychloroquine Intracellular inhibition of lysosomal enzymes  Urologic Agents . Alpha Blockers o Doxazosin o Prazosin o Tamsulosin o Terazosin  Increased renin and aldosterone  Adapted from Page RL, et al. "Drugs That May Cause or Exacerbate Heart Failure: A Scientific Statement from the [place]." Circulation 2016; 201:E07-H21. DOI: 10.1161/CIR.0000000000000426   MEDICATION ADHERENCES TIPS AND STRATEGIES 1. Taking medication as prescribed improves patient outcomes in heart failure (reduces hospitalizations, improves symptoms, increases survival) 2. Side effects of medications can be managed by decreasing doses, switching agents, stopping drugs, or adding additional therapy. Please let someone in the [facility] know if you have having bothersome side effects so we can modify your regimen. Do not alter your medication regimen without talking to [place].  3. Medication reminders can help patients remember to take drugs on time. If you are missing or forgetting doses you can try linking behaviors, using pill boxes, or an electronic reminder like an alarm on your phone or an app. Some  people can also get automated phone calls as medication reminders.

## 2020-08-29 ENCOUNTER — Encounter: Payer: Self-pay | Admitting: Radiation Oncology

## 2020-08-29 ENCOUNTER — Other Ambulatory Visit: Payer: Self-pay | Admitting: *Deleted

## 2020-08-29 ENCOUNTER — Ambulatory Visit
Admission: RE | Admit: 2020-08-29 | Discharge: 2020-08-29 | Disposition: A | Discharge status: Home / Self Care | Payer: Medicare Other | Source: Ambulatory Visit | Attending: Radiation Oncology | Admitting: Radiation Oncology

## 2020-08-29 VITALS — BP 140/127 | HR 105 | Temp 97.5°F | Wt 140.9 lb

## 2020-08-29 DIAGNOSIS — F17210 Nicotine dependence, cigarettes, uncomplicated: Secondary | ICD-10-CM | POA: Diagnosis not present

## 2020-08-29 DIAGNOSIS — R911 Solitary pulmonary nodule: Secondary | ICD-10-CM

## 2020-08-29 DIAGNOSIS — R918 Other nonspecific abnormal finding of lung field: Secondary | ICD-10-CM | POA: Diagnosis present

## 2020-08-29 DIAGNOSIS — Z923 Personal history of irradiation: Secondary | ICD-10-CM | POA: Diagnosis not present

## 2020-08-29 NOTE — Progress Notes (Signed)
Radiation Oncology Follow up Note  Name: John Frank   Date:   08/29/2020 MRN:  [id number] DOB: 1955-08-30    This 65 y.o. male presents to the clinic today for 1 month follow-up status post SBRT to his.  Right lower lobe for presumed stage I non-small cell lung cancer  REFERRING PROVIDER: Idelle Crouch, MD  HPI: Patient is a 65 year old male now out 1 month having completed SBRT to his right lower lobe for presumed stage I non-small cell lung cancer.  Seen today in routine follow-up he is doing well still is a slight clear cough no dysphagia no excessive fatigue.  He continues to smoke.  He has not had any imaging studies at this time..  COMPLICATIONS OF TREATMENT: none  FOLLOW UP COMPLIANCE: keeps appointments   PHYSICAL EXAM:  BP (!) 140/127   Pulse (!) 105   Temp (!) 97.5 F (36.4 C) (Tympanic)   Wt 140 lb 14.4 oz (63.9 kg)   SpO2 99% Comment: room air  BMI 24.19 kg/m  Well-developed well-nourished patient in NAD. HEENT reveals PERLA, EOMI, discs not visualized.  Oral cavity is clear. No oral mucosal lesions are identified. Neck is clear without evidence of cervical or supraclavicular adenopathy. Lungs are clear to A&P. Cardiac examination is essentially unremarkable with regular rate and rhythm without murmur rub or thrill. Abdomen is benign with no organomegaly or masses noted. Motor sensory and DTR levels are equal and symmetric in the upper and lower extremities. Cranial nerves II through XII are grossly intact. Proprioception is intact. No peripheral adenopathy or edema is identified. No motor or sensory levels are noted. Crude visual fields are within normal range.  RADIOLOGY RESULTS: No current films for review  PLAN: Present time he is recovering from his SBRT with minimal side effect profile.  And pleased with his overall progress.  I have asked to see him back in 3 months with a CT scan of the chest with contrast.Patient knows to call with any concerns at any  time.  I would like to take this opportunity to thank you for allowing me to participate in the care of your patient.Noreene Filbert, MD

## 2020-09-10 ENCOUNTER — Emergency Department: Payer: Medicare Other

## 2020-09-10 ENCOUNTER — Encounter: Payer: Self-pay | Admitting: Intensive Care

## 2020-09-10 ENCOUNTER — Other Ambulatory Visit: Payer: Self-pay

## 2020-09-10 ENCOUNTER — Emergency Department
Admission: EM | Admit: 2020-09-10 | Discharge: 2020-09-10 | Disposition: A | Discharge status: Home / Self Care | Payer: Medicare Other | Attending: Emergency Medicine | Admitting: Emergency Medicine

## 2020-09-10 DIAGNOSIS — Z7902 Long term (current) use of antithrombotics/antiplatelets: Secondary | ICD-10-CM | POA: Diagnosis not present

## 2020-09-10 DIAGNOSIS — I2510 Atherosclerotic heart disease of native coronary artery without angina pectoris: Secondary | ICD-10-CM | POA: Insufficient documentation

## 2020-09-10 DIAGNOSIS — E119 Type 2 diabetes mellitus without complications: Secondary | ICD-10-CM | POA: Insufficient documentation

## 2020-09-10 DIAGNOSIS — I5022 Chronic systolic (congestive) heart failure: Secondary | ICD-10-CM | POA: Insufficient documentation

## 2020-09-10 DIAGNOSIS — J441 Chronic obstructive pulmonary disease with (acute) exacerbation: Secondary | ICD-10-CM | POA: Insufficient documentation

## 2020-09-10 DIAGNOSIS — Z7984 Long term (current) use of oral hypoglycemic drugs: Secondary | ICD-10-CM | POA: Diagnosis not present

## 2020-09-10 DIAGNOSIS — Z7982 Long term (current) use of aspirin: Secondary | ICD-10-CM | POA: Insufficient documentation

## 2020-09-10 DIAGNOSIS — F17210 Nicotine dependence, cigarettes, uncomplicated: Secondary | ICD-10-CM | POA: Insufficient documentation

## 2020-09-10 DIAGNOSIS — Z950 Presence of cardiac pacemaker: Secondary | ICD-10-CM | POA: Insufficient documentation

## 2020-09-10 DIAGNOSIS — Z79899 Other long term (current) drug therapy: Secondary | ICD-10-CM | POA: Diagnosis not present

## 2020-09-10 DIAGNOSIS — R0602 Shortness of breath: Secondary | ICD-10-CM | POA: Diagnosis present

## 2020-09-10 DIAGNOSIS — I110 Hypertensive heart disease with heart failure: Secondary | ICD-10-CM | POA: Diagnosis not present

## 2020-09-10 LAB — BASIC METABOLIC PANEL
Anion gap: 12 (ref 5–15)
Anion gap: 8 (ref 5–15)
BUN: 20 mg/dL (ref 8–23)
BUN: 20 mg/dL (ref 8–23)
CO2: 23 mmol/L (ref 22–32)
CO2: 26 mmol/L (ref 22–32)
Calcium: 9.1 mg/dL (ref 8.9–10.3)
Calcium: 8.9 mg/dL (ref 8.9–10.3)
Chloride: 92 mmol/L — ABNORMAL LOW (ref 98–111)
Chloride: 94 mmol/L — ABNORMAL LOW (ref 98–111)
Creatinine, Ser: 1.02 mg/dL (ref 0.61–1.24)
Creatinine, Ser: 0.99 mg/dL (ref 0.61–1.24)
GFR, Estimated: >60 mL/min (ref >60)
GFR, Estimated: >60 mL/min (ref >60)
Glucose, Bld: 294 mg/dL — ABNORMAL HIGH (ref 70–99)
Glucose, Bld: 273 mg/dL — ABNORMAL HIGH (ref 70–99)
Potassium: 5.8 mmol/L — ABNORMAL HIGH (ref 3.5–5.1)
Potassium: 5.1 mmol/L (ref 3.5–5.1)
Sodium: 127 mmol/L — ABNORMAL LOW (ref 135–145)
Sodium: 128 mmol/L — ABNORMAL LOW (ref 135–145)

## 2020-09-10 LAB — TROPONIN I (HIGH SENSITIVITY): Troponin I (High Sensitivity): 11 ng/L (ref <18)

## 2020-09-10 LAB — CBC
HCT: 40.4 % (ref 39.0–52.0)
Hemoglobin: 13.2 g/dL (ref 13.0–17.0)
MCH: 29 pg (ref 26.0–34.0)
MCHC: 32.7 g/dL (ref 30.0–36.0)
MCV: 88.8 fL (ref 80.0–100.0)
Platelets: 251 10*3/uL (ref 150–400)
RBC: 4.55 MIL/uL (ref 4.22–5.81)
RDW: 17.6 % — ABNORMAL HIGH (ref 11.5–15.5)
WBC: 12.1 10*3/uL — ABNORMAL HIGH (ref 4.0–10.5)
nRBC: 0.2 % (ref 0.0–0.2)

## 2020-09-10 MED ORDER — IPRATROPIUM-ALBUTEROL 0.5-2.5 (3) MG/3ML IN SOLN
3.0000 mL | Freq: Once | RESPIRATORY_TRACT | Status: AC
  Administered 2020-09-10: 3 mL via RESPIRATORY_TRACT
  Filled 2020-09-10: qty 3

## 2020-09-10 MED ORDER — PREDNISONE 50 MG PO TABS: 50.0000 mg | ORAL_TABLET | Freq: Every day | ORAL | 0 refills | Status: DC

## 2020-09-10 MED ORDER — SODIUM CHLORIDE 0.9 % IV BOLUS
500.0000 mL | Freq: Once | INTRAVENOUS | Status: AC
  Administered 2020-09-10: 500 mL via INTRAVENOUS

## 2020-09-10 MED ORDER — METHYLPREDNISOLONE SODIUM SUCC 125 MG IJ SOLR
125.0000 mg | Freq: Once | INTRAMUSCULAR | Status: AC
  Administered 2020-09-10: 125 mg via INTRAVENOUS
  Filled 2020-09-10: qty 2

## 2020-09-10 NOTE — ED Notes (Signed)
Pt taken to POV in [place]. VSS. NAD. All questions and concerns addressed.

## 2020-09-10 NOTE — ED Triage Notes (Signed)
Pt comes via EMS with c/o SOB. EMS reports hx of COPD. [id number]

## 2020-09-10 NOTE — ED Triage Notes (Signed)
Pt c/o sob with cough since around 12:30 today that has not subsided. Denies pain.

## 2020-09-10 NOTE — ED Provider Notes (Signed)
[facility] Emergency Department Provider Note   ____________________________________________    I have reviewed the triage vital signs and the nursing notes.   HISTORY  Chief Complaint Shortness of Breath     HPI John Frank is a 65 y.o. male with history of CAD COPD, diabetes, additional past medical history as detailed below who presents with complaints of cough and shortness of breath.  Patient reports this is likely related to his COPD.  He does use albuterol frequently during the day.  Denies fevers or chills.  No nausea vomiting or diaphoresis.  No sick contacts reported  Past Medical History:  Diagnosis Date  . AICD (automatic cardioverter/defibrillator) present   . Angina at rest [facility])   . Aortic atherosclerosis ([place])   . CAD (coronary artery disease)    s/p stents in 2017  . Chronic systolic CHF (congestive heart failure) ([place])   . Cirrhosis of liver ([place])   . COPD (chronic obstructive pulmonary disease) ([place])   . Diabetes ([place])   . GERD (gastroesophageal reflux disease)   . Heart attack ([place])   . Hyperlipidemia   . Hypertension   . Ischemic cardiomyopathy   . Paroxysmal ventricular tachycardia ([place])   . Right lower lobe lung mass   . Shortness of breath dyspnea   . Sleep apnea     Patient Active Problem List   Diagnosis Date Noted  . Pain due to onychomycosis of toenails of both feet 03/15/2020  . Ulcer of esophagus without bleeding   . Gastritis with intestinal metaplasia of stomach   . COPD with acute exacerbation ([place]) 05/25/2018  . Iron deficiency anemia   . Cirrhosis of liver with ascites ([place])   . Chronic systolic heart failure ([place]) 08/31/2017  . CHF exacerbation ([place]) 08/09/2017  . Syncope 01/11/2016  . Tobacco abuse 04/06/2015  . HTN (hypertension) 11/03/2014  . Sleep apnea 10/26/2014  . CAD (coronary artery disease) 10/26/2014  . GERD (gastroesophageal reflux disease) 10/26/2014  . Polyp of colon 10/26/2014   . Diabetes mellitus without complication ([place]) 09/32/3557  . Hyperlipidemia 10/26/2014  . COPD, severe ([place]) 10/26/2014  . Status cardiac pacemaker 10/26/2014  . Lesion of nasal septum 10/26/2014    Past Surgical History:  Procedure Laterality Date  . CARDIAC PACEMAKER PLACEMENT    . cardiac stents    . CARDIAC SURGERY     defibrillator and pacemaker  . CORONARY ANGIOPLASTY    . ESOPHAGOGASTRODUODENOSCOPY N/A 09/19/2014   Procedure: ESOPHAGOGASTRODUODENOSCOPY (EGD);  Surgeon: Lucilla Lame, MD;  Location: [facility] ENDOSCOPY;  Service: Endoscopy;  Laterality: N/A;  . ESOPHAGOGASTRODUODENOSCOPY (EGD) WITH PROPOFOL N/A 09/28/2017   Procedure: ESOPHAGOGASTRODUODENOSCOPY (EGD) WITH PROPOFOL;  Surgeon: Virgel Manifold, MD;  Location: [facility] ENDOSCOPY;  Service: Endoscopy;  Laterality: N/A;  . ESOPHAGOGASTRODUODENOSCOPY (EGD) WITH PROPOFOL N/A 04/19/2018   Procedure: ESOPHAGOGASTRODUODENOSCOPY (EGD) WITH PROPOFOL with Gastric Mapping;  Surgeon: [name], MD;  Location: [facility] ENDOSCOPY;  Service: Gastroenterology;  Laterality: N/A;  . ESOPHAGOGASTRODUODENOSCOPY (EGD) WITH PROPOFOL N/A 12/13/2018   Procedure: ESOPHAGOGASTRODUODENOSCOPY (EGD) WITH PROPOFOL;  Surgeon: [name], MD;  Location: [facility] ENDOSCOPY;  Service: Gastroenterology;  Laterality: N/A;  . ESOPHAGOGASTRODUODENOSCOPY (EGD) WITH PROPOFOL N/A 03/31/2019   Procedure: ESOPHAGOGASTRODUODENOSCOPY (EGD) WITH PROPOFOL;  Surgeon: Lin Landsman, MD;  Location: [facility] ENDOSCOPY;  Service: Gastroenterology;  Laterality: N/A;  . IMPLANTABLE CARDIOVERTER DEFIBRILLATOR (ICD) GENERATOR CHANGE Left 02/21/2020   Procedure: ICD GENERATOR CHANGE;  Surgeon: Isaias Cowman, MD;  Location: [facility] ORS;  Service: Cardiovascular;  Laterality:  Left;  . VIDEO BRONCHOSCOPY WITH ENDOBRONCHIAL NAVIGATION N/A 06/22/2020   Procedure: VIDEO BRONCHOSCOPY WITH ENDOBRONCHIAL NAVIGATION;  Surgeon: Ottie Glazier, MD;  Location: [facility] ORS;  Service: Thoracic;   Laterality: N/A;  . VIDEO BRONCHOSCOPY WITH ENDOBRONCHIAL ULTRASOUND N/A 06/22/2020   Procedure: VIDEO BRONCHOSCOPY WITH ENDOBRONCHIAL ULTRASOUND;  Surgeon: Ottie Glazier, MD;  Location: [facility] ORS;  Service: Thoracic;  Laterality: N/A;    Prior to Admission medications   Medication Sig Start Date End Date Taking? Authorizing Provider  aspirin 81 MG EC tablet Take 81 mg by mouth daily.    [provider]  atorvastatin (LIPITOR) 40 MG tablet TAKE 1 TABLET (40 MG TOTAL) BY MOUTH DAILY. Patient taking differently: Take 40 mg by mouth daily. 03/08/18   Cannady, Henrine Screws T, NP  Blood Glucose Monitoring Suppl (ACCU-CHEK GUIDE ME) w/Device KIT  02/23/20   [provider]  budesonide-formoterol (SYMBICORT) 160-4.5 MCG/ACT inhaler Inhale 2 puffs into the lungs 2 (two) times daily.    [provider]  buPROPion (WELLBUTRIN) 75 MG tablet Take 75 mg by mouth daily. 03/26/16 02/14/21  [provider]  clopidogrel (PLAVIX) 75 MG tablet TAKE 1 TABLET (75 MG TOTAL) BY MOUTH DAILY. 12/08/16   Kathrine Haddock, NP  empagliflozin (JARDIANCE) 10 MG TABS tablet Take 10 mg by mouth daily with breakfast.    [provider]  ENTRESTO 49-51 MG TAKE 1 TABLET BY MOUTH TWICE A DAY 06/21/19   Darylene Price A, FNP  furosemide (LASIX) 40 MG tablet Take 40 mg by mouth. Patient not taking: Reported on 08/29/2020    [provider]  furosemide (LASIX) 40 MG tablet Take 1 tablet (40 mg total) by mouth daily. Patient not taking: Reported on 08/29/2020 08/27/20 08/27/21  Vladimir Crofts, MD  glimepiride (AMARYL) 4 MG tablet Take 4 mg by mouth daily with breakfast. 02/23/20 02/22/21  [provider]  ipratropium-albuterol (DUONEB) 0.5-2.5 (3) MG/3ML SOLN Take 3 mLs by nebulization in the morning, at noon, and at bedtime.    [provider]  lansoprazole (PREVACID) 15 MG capsule Take 1 capsule (15 mg total) by mouth daily. 07/18/20   Noreene Filbert, MD  levocetirizine (XYZAL) 5  MG tablet Take 5 mg by mouth every evening. 11/10/18   [provider]  metFORMIN (GLUCOPHAGE) 1000 MG tablet TAKE 1 TABLET (1,000 MG TOTAL) BY MOUTH 2 (TWO) TIMES DAILY WITH A MEAL. Patient taking differently: Take 1,000 mg by mouth in the morning and at bedtime. 09/12/16   Kathrine Haddock, NP  metoprolol succinate (TOPROL-XL) 100 MG 24 hr tablet TAKE 1 TABLET BY MOUTH EVERY DAY 07/15/20   Darylene Price A, FNP  ondansetron (ZOFRAN ODT) 4 MG disintegrating tablet Take 1 tablet (4 mg total) by mouth every 8 (eight) hours as needed. 09/07/19   Rudene Re, MD  ONE [facility] ULTRA TEST test strip USE TO TEST BLOOD SUGAR ONCE DAILY 10/07/16   Park Liter P, DO  pantoprazole (PROTONIX) 40 MG tablet Take 1 tablet (40 mg total) by mouth 2 (two) times daily before a meal. 12/13/18 02/14/21  Vanga, Tally Due, MD  predniSONE (DELTASONE) 50 MG tablet Take 1 tablet (50 mg total) by mouth daily with breakfast. 09/10/20   Lavonia Drafts, MD  promethazine (PHENERGAN) 25 MG tablet Take 1 tablet (25 mg total) by mouth every 6 (six) hours as needed for nausea or vomiting. 07/18/20   Chrystal, Eulas Post, MD  Propylene Glycol (SYSTANE BALANCE) 0.6 % SOLN Apply to eye.    [provider]  spironolactone (ALDACTONE) 25 MG tablet TAKE 1/2 TABLET BY MOUTH EVERY DAY Patient taking differently: Take 12.5 mg by mouth daily. 05/02/19   Alisa Graff, FNP  sucralfate (CARAFATE) 1 g tablet Take 1 g by mouth 3 (three) times daily before meals. 05/15/17 02/14/21  [provider]  torsemide (DEMADEX) 20 MG tablet Take 1 tablet by mouth daily. 08/27/20 08/27/21  [provider]  TRULICITY 1.5 [place]/7.0WC SOPN Inject 0.5 mg into the skin every Sunday. 08/30/19   [provider]     Allergies Patient has no known allergies.  Family History  Problem Relation Age of Onset  . Diabetes Mother   . Heart attack Father 64    Social History Social History   Tobacco Use  . Smoking status: Current Every  Day Smoker    Packs/day: 0.25    Years: 40.00    Pack years: 10.00    Types: Cigarettes  . Smokeless tobacco: Never Used  Vaping Use  . Vaping Use: Never used  Substance Use Topics  . Alcohol use: No  . Drug use: Never    Review of Systems  Constitutional: No fever/chills Eyes: No visual changes.  ENT: No sore throat. Cardiovascular: As above Respiratory: As above Gastrointestinal: No abdominal pain.  No nausea, no vomiting.   Genitourinary: Negative for dysuria. Musculoskeletal: Negative for back pain. Skin: Negative for rash. Neurological: Negative for headaches or weakness   ____________________________________________   PHYSICAL EXAM:  VITAL SIGNS: ED Triage Vitals [09/10/20 1848]  Enc Vitals Group     BP 94/75     Pulse Rate 88     Resp 18     Temp 97.7 F (36.5 C)     Temp Source Oral     SpO2 98 %     Weight 64.9 kg (143 lb)     Height 1.626 m (_0 )     Head Circumference      Peak Flow      Pain Score 0     Pain Loc      Pain Edu?      Excl. in [place]?     Constitutional: Alert and oriented.   Nose: No congestion/rhinnorhea. Mouth/Throat: Mucous membranes are moist.    Cardiovascular: Normal rate, regular rhythm. Grossly normal heart sounds.  Good peripheral circulation. Respiratory: Normal respiratory effort.  No retractions.  Scattered wheezing Gastrointestinal: Soft and nontender. N  Musculoskeletal: No lower extremity tenderness nor edema.  Warm and well perfused Neurologic:  Normal speech and language. No gross focal neurologic deficits are appreciated.  Skin:  Skin is warm, dry and intact. No rash noted. Psychiatric: Mood and affect are normal. Speech and behavior are normal.  ____________________________________________   LABS (all labs ordered are listed, but only abnormal results are displayed)  Labs Reviewed  BASIC METABOLIC PANEL - Abnormal; Notable for the following components:      Result Value   Sodium 127 (*)    Potassium  5.8 (*)    Chloride 92 (*)    Glucose, Bld 294 (*)    All other components within normal limits  CBC - Abnormal; Notable for the following components:   WBC 12.1 (*)    RDW 17.6 (*)    All other components within normal limits  BASIC METABOLIC PANEL - Abnormal; Notable for the following components:   Sodium 128 (*)    Chloride 94 (*)    Glucose, Bld 273 (*)    All other components within normal limits  TROPONIN I (HIGH SENSITIVITY)   ____________________________________________  EKG   ____________________________________________  RADIOLOGY  Chest x-ray viewed by me, no acute abnormality ____________________________________________   PROCEDURES  Procedure(s) performed: No  Procedures   Critical Care performed: No ____________________________________________   INITIAL IMPRESSION / ASSESSMENT AND PLAN / ED COURSE  Pertinent labs & imaging results that were available during my care of the patient were reviewed by me and considered in my medical decision making (see chart for details).  Patient presents with shortness of breath as above, wheezing on exam most consistent with COPD exacerbation.  Will treat with duo nebs, IV Solu-Medrol, oxygen saturations are reassuring.  Lab work notable for high sensitive troponin within normal range, minimal elevation of white blood cell count likely related to shortness of breath.  Chest x-ray without evidence of pneumonia.  Mild hyponatremia likely related to elevated glucose, elevated potassium is unusual, suspect error with blood work, will repeat after IV fluid  Repeat potassium is normal, appropriate for discharge, patient is anxious to leave    ____________________________________________   FINAL CLINICAL IMPRESSION(S) / ED DIAGNOSES  Final diagnoses:  COPD exacerbation ([place])        Note:  This document was prepared using Dragon voice recognition software and may include unintentional dictation errors.    Lavonia Drafts, MD 09/10/20 2158

## 2020-09-17 ENCOUNTER — Ambulatory Visit (INDEPENDENT_AMBULATORY_CARE_PROVIDER_SITE_OTHER): Payer: Medicare Other | Admitting: Podiatry

## 2020-09-17 ENCOUNTER — Encounter: Payer: Self-pay | Admitting: Podiatry

## 2020-09-17 ENCOUNTER — Other Ambulatory Visit: Payer: Self-pay

## 2020-09-17 DIAGNOSIS — M79675 Pain in left toe(s): Secondary | ICD-10-CM

## 2020-09-17 DIAGNOSIS — M79674 Pain in right toe(s): Secondary | ICD-10-CM | POA: Diagnosis not present

## 2020-09-17 DIAGNOSIS — B351 Tinea unguium: Secondary | ICD-10-CM

## 2020-09-17 DIAGNOSIS — E119 Type 2 diabetes mellitus without complications: Secondary | ICD-10-CM

## 2020-09-17 NOTE — Progress Notes (Signed)
This patient returns to my office for at risk foot care.  This patient requires this care by a professional since this patient will be at risk due to having diabetes and coagulation defect.  Patient is taking plavix.  Patient has not been seen for years.  This patient is unable to cut nails himself since the patient cannot reach his nails.These nails are painful walking and wearing shoes.  This patient presents for at risk foot care today.  General Appearance  Alert, conversant and in no acute stress.  Vascular  Dorsalis pedis and posterior tibial  pulses are weakly  palpable  bilaterally.  Capillary return is within normal limits  bilaterally. Cold feet  Bilaterally.  Absent digital hair.  Neurologic  Senn-Weinstein monofilament wire test within normal limits  bilaterally. Muscle power within normal limits bilaterally.  Nails Thick disfigured discolored nails with subungual debris  from hallux to fifth toes bilaterally. No evidence of bacterial infection or drainage bilaterally. Healing fourth toenails left foot.  Orthopedic  No limitations of motion  feet .  No crepitus or effusions noted.  No bony pathology or digital deformities noted.  Skin  normotropic skin with no porokeratosis noted bilaterally.  No signs of infections or ulcers noted.     Onychomycosis  Pain in right toes  Pain in left toes  Consent was obtained for treatment procedures.   Mechanical debridement of nails 1-5  bilaterally performed with a nail nipper.  Filed with dremel without incident.    Return office visit   3 months                  Told patient to return for periodic foot care and evaluation due to potential at risk complications.   Gardiner Barefoot DPM

## 2020-09-18 ENCOUNTER — Encounter: Payer: Self-pay | Admitting: Family

## 2020-09-18 ENCOUNTER — Other Ambulatory Visit
Admission: RE | Admit: 2020-09-18 | Discharge: 2020-09-18 | Disposition: A | Discharge status: Home / Self Care | Payer: Medicare Other | Source: Ambulatory Visit | Attending: Family | Admitting: Family

## 2020-09-18 ENCOUNTER — Ambulatory Visit: Payer: Medicare Other | Attending: Family | Admitting: Family

## 2020-09-18 VITALS — BP 104/71 | HR 83 | Resp 18 | Ht 64.0 in | Wt 131.5 lb

## 2020-09-18 DIAGNOSIS — I110 Hypertensive heart disease with heart failure: Secondary | ICD-10-CM | POA: Insufficient documentation

## 2020-09-18 DIAGNOSIS — J449 Chronic obstructive pulmonary disease, unspecified: Secondary | ICD-10-CM | POA: Insufficient documentation

## 2020-09-18 DIAGNOSIS — E119 Type 2 diabetes mellitus without complications: Secondary | ICD-10-CM | POA: Diagnosis not present

## 2020-09-18 DIAGNOSIS — Z7951 Long term (current) use of inhaled steroids: Secondary | ICD-10-CM | POA: Diagnosis not present

## 2020-09-18 DIAGNOSIS — Z95810 Presence of automatic (implantable) cardiac defibrillator: Secondary | ICD-10-CM | POA: Diagnosis not present

## 2020-09-18 DIAGNOSIS — Z7984 Long term (current) use of oral hypoglycemic drugs: Secondary | ICD-10-CM | POA: Insufficient documentation

## 2020-09-18 DIAGNOSIS — I10 Essential (primary) hypertension: Secondary | ICD-10-CM

## 2020-09-18 DIAGNOSIS — Z955 Presence of coronary angioplasty implant and graft: Secondary | ICD-10-CM | POA: Diagnosis not present

## 2020-09-18 DIAGNOSIS — Z8249 Family history of ischemic heart disease and other diseases of the circulatory system: Secondary | ICD-10-CM | POA: Diagnosis not present

## 2020-09-18 DIAGNOSIS — I5022 Chronic systolic (congestive) heart failure: Secondary | ICD-10-CM | POA: Diagnosis present

## 2020-09-18 DIAGNOSIS — F17210 Nicotine dependence, cigarettes, uncomplicated: Secondary | ICD-10-CM | POA: Insufficient documentation

## 2020-09-18 LAB — BASIC METABOLIC PANEL
Anion gap: 9 (ref 5–15)
BUN: 19 mg/dL (ref 8–23)
CO2: 25 mmol/L (ref 22–32)
Calcium: 8.7 mg/dL — ABNORMAL LOW (ref 8.9–10.3)
Chloride: 99 mmol/L (ref 98–111)
Creatinine, Ser: 0.77 mg/dL (ref 0.61–1.24)
GFR, Estimated: >60 mL/min (ref >60)
Glucose, Bld: 184 mg/dL — ABNORMAL HIGH (ref 70–99)
Potassium: 3.9 mmol/L (ref 3.5–5.1)
Sodium: 133 mmol/L — ABNORMAL LOW (ref 135–145)

## 2020-09-18 NOTE — Progress Notes (Signed)
[place] - PHARMACIST COUNSELING NOTE  ADHERENCE ASSESSMENT  Adherence strategy: pill packs   Do you ever forget to take your medication? _0 Yes (1) _1 No (0)  Do you ever skip doses due to side effects? _2 Yes (1) _3 No (0)  Do you have trouble affording your medicines? _4 Yes (1) _5 No (0)  Are you ever unable to pick up your medication due to transportation difficulties? _6 Yes (1) _7 No (0)  Do you ever stop taking your medications because you don't believe they are helping? _8 Yes (1) _9 No (0)  Total score _2______     Guideline-Directed Medical Therapy/Evidence Based Medicine  ACE/ARB/ARNI: Delene Loll 49-51 mg twice daily Beta Blocker: metoprolol 100 mg daily  Aldosterone Antagonist: spironolactone 12.5 mg daily  Diuretic: furosemide 40 mg daily    SUBJECTIVE  HPI:  Past Medical History:  Diagnosis Date  . AICD (automatic cardioverter/defibrillator) present   . Angina at rest [facility])   . Aortic atherosclerosis ([place])   . CAD (coronary artery disease)    s/p stents in 2017  . Chronic systolic CHF (congestive heart failure) ([place])   . Cirrhosis of liver ([place])   . COPD (chronic obstructive pulmonary disease) ([place])   . Diabetes ([place])   . GERD (gastroesophageal reflux disease)   . Heart attack ([place])   . Hyperlipidemia   . Hypertension   . Ischemic cardiomyopathy   . Paroxysmal ventricular tachycardia ([place])   . Right lower lobe lung mass   . Shortness of breath dyspnea   . Sleep apnea         OBJECTIVE   Vital signs: HR 83, BP 104/71, weight 64.9 kg  ECHO: Date 11/19/18, EF 40-45%  BMP Latest Ref Rng & Units 09/10/2020 09/10/2020 08/27/2020  Glucose 70 - 99 mg/dL 273(H) 294(H) 227(H)  BUN 8 - 23 mg/dL _10 Creatinine 0.61 - 1.24 mg/dL 0.99 1.02 0.68  BUN/Creat Ratio 10 - 24 - - -  Sodium 135 - 145 mmol/L 128(L) 127(L) 138  Potassium 3.5 - 5.1 mmol/L 5.1 5.8(H) 3.6  Chloride 98 - 111 mmol/L 94(L) 92(L) 101  CO2 22 -  32 mmol/L _11 Calcium 8.9 - 10.3 mg/dL 8.9 9.1 9.0    ASSESSMENT 65 yo M presented to heart failure clinic for follow-up. Pt had ED visit 4/18 related to likely volume overload +/- COPD exacerbation. PMH includes diabetes, CHF, HTN, OSA, HLD, heart attack, GERD, COPD, cirrhosis, and CAD. Pt is still concerned of nausea with Trulicity but states switching the injection to bedtime and taking ondansetron helped. Pt did bring medications in for review today. No additional barriers identified during medication reconciliation.    PLAN CHF/HTN - Continue Entresto 49-51 mg twice daily  - Continue torsemide 20 mg daily  - Continue metoprolol succinate 100 mg daily  - Continue spironolactone 12.5 mg daily - Continue empagliflozin 10 mg daily   Lab Monitoring - K 5.1 on 5/2 - Pt reports eating 1 banana per day since told K level was low. Pt also takes Entresto and spironolactone which may increase K levels. Recheck K level today.  Hx of heart attack - Continue aspirin 81 mg daily - Continue atorvastatin 40 mg daily - Continue clopidogrel 75 mg daily  - Continue Entresto 49-51 mg twice daily  - Continue metoprolol succinate 100 mg daily   Diabetes/HLD/CAD - Continue empagliflozin 10 mg daily  - Continue glimepiride 4 mg daily  - Continue metformin 1000 mg twice daily -  Pt reports GI upset with Trulicity. Encouraged pt to continue trying injections if able as GI side effects will improve with use. Pt may need to follow-up with PCP regarding Trulicity if inability to tolerate continues.   GERD/ulcer - Continue pantoprazole 40 mg twice daily - Continue sucralfate 1 g four times daily with meals   COPD - Continue Duoneb as needed  Mood - Continue bupropion 75 mg daily   Allergies - Continue levocetirizine 5 mg daily   Nausea - Continue ondansetron 4 mg every 8 hours as needed - Continue promethazine 25 mg every 6 hours as needed   Time spent: 15 minutes  Benn Moulder, PharmD Pharmacy Resident  09/18/2020 8:45 AM    Current Outpatient Medications:  .  aspirin 81 MG EC tablet, Take 81 mg by mouth daily., Disp: , Rfl:  .  atorvastatin (LIPITOR) 40 MG tablet, TAKE 1 TABLET (40 MG TOTAL) BY MOUTH DAILY. (Patient taking differently: Take 40 mg by mouth daily.), Disp: 90 tablet, Rfl: 1 .  Blood Glucose Monitoring Suppl (ACCU-CHEK GUIDE ME) w/Device KIT, , Disp: , Rfl:  .  budesonide-formoterol (SYMBICORT) 160-4.5 MCG/ACT inhaler, Inhale 2 puffs into the lungs 2 (two) times daily., Disp: , Rfl:  .  buPROPion (WELLBUTRIN) 75 MG tablet, Take 75 mg by mouth daily., Disp: , Rfl:  .  clopidogrel (PLAVIX) 75 MG tablet, TAKE 1 TABLET (75 MG TOTAL) BY MOUTH DAILY., Disp: 90 tablet, Rfl: 3 .  empagliflozin (JARDIANCE) 10 MG TABS tablet, Take 10 mg by mouth daily with breakfast., Disp: , Rfl:  .  ENTRESTO 49-51 MG, TAKE 1 TABLET BY MOUTH TWICE A DAY, Disp: 60 tablet, Rfl: 5 .  glimepiride (AMARYL) 4 MG tablet, Take 4 mg by mouth daily with breakfast., Disp: , Rfl:  .  ipratropium-albuterol (DUONEB) 0.5-2.5 (3) MG/3ML SOLN, Take 3 mLs by nebulization in the morning, at noon, and at bedtime., Disp: , Rfl:  .  lansoprazole (PREVACID) 15 MG capsule, Take 1 capsule (15 mg total) by mouth daily., Disp: 30 capsule, Rfl: 3 .  levocetirizine (XYZAL) 5 MG tablet, Take 5 mg by mouth every evening., Disp: , Rfl:  .  metFORMIN (GLUCOPHAGE) 1000 MG tablet, TAKE 1 TABLET (1,000 MG TOTAL) BY MOUTH 2 (TWO) TIMES DAILY WITH A MEAL. (Patient taking differently: Take 1,000 mg by mouth in the morning and at bedtime.), Disp: 180 tablet, Rfl: 1 .  metoprolol succinate (TOPROL-XL) 100 MG 24 hr tablet, TAKE 1 TABLET BY MOUTH EVERY DAY, Disp: 90 tablet, Rfl: 3 .  ondansetron (ZOFRAN ODT) 4 MG disintegrating tablet, Take 1 tablet (4 mg total) by mouth every 8 (eight) hours as needed., Disp: 20 tablet, Rfl: 0 .  ONE TOUCH ULTRA TEST test strip, USE TO TEST BLOOD SUGAR ONCE DAILY, Disp: 100  each, Rfl: 3 .  pantoprazole (PROTONIX) 40 MG tablet, Take 1 tablet (40 mg total) by mouth 2 (two) times daily before a meal., Disp: 180 tablet, Rfl: 1 .  predniSONE (DELTASONE) 50 MG tablet, Take 1 tablet (50 mg total) by mouth daily with breakfast., Disp: 4 tablet, Rfl: 0 .  promethazine (PHENERGAN) 25 MG tablet, Take 1 tablet (25 mg total) by mouth every 6 (six) hours as needed for nausea or vomiting., Disp: 30 tablet, Rfl: 0 .  Propylene Glycol (SYSTANE BALANCE) 0.6 % SOLN, Apply to eye., Disp: , Rfl:  .  spironolactone (ALDACTONE) 25 MG tablet, TAKE 1/2 TABLET BY MOUTH EVERY DAY (Patient taking differently: Take 12.5 mg  by mouth daily.), Disp: 45 tablet, Rfl: 3 .  sucralfate (CARAFATE) 1 g tablet, Take 1 g by mouth 3 (three) times daily before meals., Disp: , Rfl:  .  torsemide (DEMADEX) 20 MG tablet, Take 1 tablet by mouth daily., Disp: , Rfl:  .  traMADol (ULTRAM) 50 MG tablet, Take 50 mg by mouth 2 (two) times daily as needed., Disp: , Rfl:  .  TRULICITY 1.5 ZO/1.0RU SOPN, Inject 0.5 mg into the skin every Sunday., Disp: , Rfl:    COUNSELING POINTS/CLINICAL PEARLS Metoprolol Succinate (Goal: 200 mg once daily) Warn patient to avoid activities requiring mental alertness or coordination until drug effects are realized, as drug may cause dizziness. Tell patient planning major surgery with anesthesia to alert physician that drug is being used, as drug impairs ability of heart to respond to reflex adrenergic stimuli. Drug may cause diarrhea, fatigue, headache, or depression. Advise diabetic patient to carefully monitor blood glucose as drug may mask symptoms of hypoglycemia. Patient should take extended-release tablet with or immediately following meals. Counsel patient against sudden discontinuation of drug, as this may precipitate hypertension, angina, or myocardial infarction. In the event of a missed dose, counsel patient to skip the missed dose and maintain a regular dosing  schedule. Entresto (Goal: 97/103 mg twice daily)  Warn male patient to avoid pregnancy during therapy and to report a pregnancy to a physician.  Advise patient to report symptomatic hypotension.  Side effects may include hyperkalemia, cough, dizziness, or renal failure. Torsemide  Side effects may include excessive urination.  Tell patient to report symptoms of ototoxicity.  Instruct patient to report lightheadedness or syncope.  Warn patient to avoid use of nonprescription NSAID products without first discussing it with their healthcare provider. Spironolactone  Warn patient to report dehydration, hypotension, or symptoms of worsening renal function.  Counsel male patient to report gynecomastia.  Side effects may include diarrhea, nausea, vomiting, abdominal cramping, fever, leg cramps, lethargy, mental confusion, decreased libido, irregular menses, and rash. Suspension: Tell patient to take drug consistently with respect to food, either before or after a meal.  Advise patient to avoid potassium supplements and foods containing high levels of potassium, including salt substitutes  DRUGS TO AVOID IN HEART FAILURE  Drug or Class Mechanism  Analgesics . NSAIDs . COX-2 inhibitors . Glucocorticoids  Sodium and water retention, increased systemic vascular resistance, decreased response to diuretics   Diabetes Medications . Metformin . Thiazolidinediones o Rosiglitazone (Avandia) o Pioglitazone (Actos) . DPP4 Inhibitors o Saxagliptin (Onglyza) o Sitagliptin (Januvia)   Lactic acidosis Possible calcium channel blockade   Unknown  Antiarrhythmics . Class I  o Flecainide o Disopyramide . Class III o Sotalol . Other o Dronedarone  Negative inotrope, proarrhythmic   Proarrhythmic, beta blockade  Negative inotrope  Antihypertensives . Alpha Blockers o Doxazosin . Calcium Channel Blockers o Diltiazem o Verapamil o Nifedipine . Central Alpha  Adrenergics o Moxonidine . Peripheral Vasodilators o Minoxidil  Increases renin and aldosterone  Negative inotrope    Possible sympathetic withdrawal  Unknown  Anti-infective . Itraconazole . Amphotericin B  Negative inotrope Unknown  Hematologic . Anagrelide . Cilostazol   Possible inhibition of PD IV Inhibition of PD III causing arrhythmias  Neurologic/Psychiatric . Stimulants . Anti-Seizure Drugs o Carbamazepine o Pregabalin . Antidepressants o Tricyclics o Citalopram . Parkinsons o Bromocriptine o Pergolide o Pramipexole . Antipsychotics o Clozapine . Antimigraine o Ergotamine o Methysergide . Appetite suppressants . Bipolar o Lithium  Peripheral alpha and beta agonist activity  Negative inotrope and chronotrope Calcium channel blockade  Negative inotrope, proarrhythmic Dose-dependent QT prolongation  Excessive serotonin activity/valvular damage Excessive serotonin activity/valvular damage Unknown  IgE mediated hypersensitivy, calcium channel blockade  Excessive serotonin activity/valvular damage Excessive serotonin activity/valvular damage Valvular damage  Direct myofibrillar degeneration, adrenergic stimulation  Antimalarials . Chloroquine . Hydroxychloroquine Intracellular inhibition of lysosomal enzymes  Urologic Agents . Alpha Blockers o Doxazosin o Prazosin o Tamsulosin o Terazosin  Increased renin and aldosterone  Adapted from Page RL, et al. "Drugs That May Cause or Exacerbate Heart Failure: A Scientific Statement from the [place]." Circulation 2016; 174:Y81-K48. DOI: 10.1161/CIR.0000000000000426   MEDICATION ADHERENCES TIPS AND STRATEGIES 1. Taking medication as prescribed improves patient outcomes in heart failure (reduces hospitalizations, improves symptoms, increases survival) 2. Side effects of medications can be managed by decreasing doses, switching agents, stopping drugs, or adding additional  therapy. Please let someone in the [facility] know if you have having bothersome side effects so we can modify your regimen. Do not alter your medication regimen without talking to [place].  3. Medication reminders can help patients remember to take drugs on time. If you are missing or forgetting doses you can try linking behaviors, using pill boxes, or an electronic reminder like an alarm on your phone or an app. Some people can also get automated phone calls as medication reminders.

## 2020-09-18 NOTE — Progress Notes (Signed)
Patient ID: John Frank, male    DOB: [birth year], 65 y.o.   MRN: [id number]   John Frank is a 65 y/o male with a history of CAD, DM, hyperlipidemia, HTN, GERD, COPD, obstructive sleep apnea, chronic heart failure and current tobacco use.   Echo report from 11/19/2018 reviewed and showed an EF of 40-45%. Echo report from 04/13/17 reviewed and showed an EF of 30% along with mild John/TR.  Was in the ED 09/10/20 due to COPD exacerbation where John Frank was treated and released. Was in the ED 08/27/20 due to COPD/ HF exacerbation. Says that John Frank's been without diuretic for several months but John Frank doesn't know why. Given IV lasix, solu-medrol and antibiotics. Admission recommended but patient refused and John Frank was released. Was in the ED 08/01/20 due to abdominal pain. Evaluated and released.    John Frank presents today for a follow-up visit with a chief complaint of productive cough. John Frank describes this as chronic in nature having been present for several years. John Frank has associated wheezing along with this. John Frank denies any difficulty sleeping, dizziness, abdominal distention, palpitations, pedal edema, chest pain, shortness of breath, fatigue or weight gain.   John Frank says that John Frank has CPAP at home but that John Frank's waiting to get new headgear as what they sent doesn't work for him.   Says that John Frank fell ~ a week ago prior to his recent ED visit and John Frank's not sure how John Frank fell but that afterwards John Frank saw red, blue, purple animals which then resolved and hasn't returned.   Potassium was slightly elevated and sodium low at recent ED visit. Says that John Frank uses pepper only to season his food and eats 1 banana daily which John Frank has done for years. Denies eating extra potatoes or dark greens.   Past Medical History:  Diagnosis Date  . AICD (automatic cardioverter/defibrillator) present   . Angina at rest [facility])   . Aortic atherosclerosis ([place])   . CAD (coronary artery disease)    s/p stents in 2017  . Chronic systolic CHF (congestive heart failure) ([place])    . Cirrhosis of liver ([place])   . COPD (chronic obstructive pulmonary disease) ([place])   . Diabetes ([place])   . GERD (gastroesophageal reflux disease)   . Heart attack ([place])   . Hyperlipidemia   . Hypertension   . Ischemic cardiomyopathy   . Paroxysmal ventricular tachycardia ([place])   . Right lower lobe lung mass   . Shortness of breath dyspnea   . Sleep apnea    Past Surgical History:  Procedure Laterality Date  . CARDIAC PACEMAKER PLACEMENT    . cardiac stents    . CARDIAC SURGERY     defibrillator and pacemaker  . CORONARY ANGIOPLASTY    . ESOPHAGOGASTRODUODENOSCOPY N/A 09/19/2014   Procedure: ESOPHAGOGASTRODUODENOSCOPY (EGD);  Surgeon: Lucilla Lame, MD;  Location: [facility] ENDOSCOPY;  Service: Endoscopy;  Laterality: N/A;  . ESOPHAGOGASTRODUODENOSCOPY (EGD) WITH PROPOFOL N/A 09/28/2017   Procedure: ESOPHAGOGASTRODUODENOSCOPY (EGD) WITH PROPOFOL;  Surgeon: Virgel Manifold, MD;  Location: [facility] ENDOSCOPY;  Service: Endoscopy;  Laterality: N/A;  . ESOPHAGOGASTRODUODENOSCOPY (EGD) WITH PROPOFOL N/A 04/19/2018   Procedure: ESOPHAGOGASTRODUODENOSCOPY (EGD) WITH PROPOFOL with Gastric Mapping;  Surgeon: [name], MD;  Location: [facility] ENDOSCOPY;  Service: Gastroenterology;  Laterality: N/A;  . ESOPHAGOGASTRODUODENOSCOPY (EGD) WITH PROPOFOL N/A 12/13/2018   Procedure: ESOPHAGOGASTRODUODENOSCOPY (EGD) WITH PROPOFOL;  Surgeon: [name], MD;  Location: [facility] ENDOSCOPY;  Service: Gastroenterology;  Laterality: N/A;  . ESOPHAGOGASTRODUODENOSCOPY (EGD) WITH PROPOFOL N/A 03/31/2019   Procedure: ESOPHAGOGASTRODUODENOSCOPY (  EGD) WITH PROPOFOL;  Surgeon: Lin Landsman, MD;  Location: [facility] ENDOSCOPY;  Service: Gastroenterology;  Laterality: N/A;  . IMPLANTABLE CARDIOVERTER DEFIBRILLATOR (ICD) GENERATOR CHANGE Left 02/21/2020   Procedure: ICD GENERATOR CHANGE;  Surgeon: Isaias Cowman, MD;  Location: [facility] ORS;  Service: Cardiovascular;  Laterality: Left;  Marland Kitchen VIDEO BRONCHOSCOPY WITH ENDOBRONCHIAL  NAVIGATION N/A 06/22/2020   Procedure: VIDEO BRONCHOSCOPY WITH ENDOBRONCHIAL NAVIGATION;  Surgeon: Ottie Glazier, MD;  Location: [facility] ORS;  Service: Thoracic;  Laterality: N/A;  . VIDEO BRONCHOSCOPY WITH ENDOBRONCHIAL ULTRASOUND N/A 06/22/2020   Procedure: VIDEO BRONCHOSCOPY WITH ENDOBRONCHIAL ULTRASOUND;  Surgeon: Ottie Glazier, MD;  Location: [facility] ORS;  Service: Thoracic;  Laterality: N/A;   Family History  Problem Relation Age of Onset  . Diabetes Mother   . Heart attack Father 6   Social History   Tobacco Use  . Smoking status: Current Every Day Smoker    Packs/day: 0.25    Years: 40.00    Pack years: 10.00    Types: Cigarettes  . Smokeless tobacco: Never Used  Substance Use Topics  . Alcohol use: No   No Known Allergies  Prior to Admission medications   Medication Sig Start Date End Date Taking? Authorizing Provider  aspirin 81 MG EC tablet Take 81 mg by mouth daily.   Yes [provider]  atorvastatin (LIPITOR) 40 MG tablet TAKE 1 TABLET (40 MG TOTAL) BY MOUTH DAILY. Patient taking differently: Take 40 mg by mouth daily. 03/08/18  Yes Cannady, Jolene T, NP  Blood Glucose Monitoring Suppl (ACCU-CHEK GUIDE ME) w/Device KIT  02/23/20  Yes [provider]  budesonide-formoterol (SYMBICORT) 160-4.5 MCG/ACT inhaler Inhale 2 puffs into the lungs 2 (two) times daily.   Yes [provider]  buPROPion (WELLBUTRIN) 75 MG tablet Take 75 mg by mouth daily. 03/26/16 02/14/21 Yes [provider]  clopidogrel (PLAVIX) 75 MG tablet TAKE 1 TABLET (75 MG TOTAL) BY MOUTH DAILY. 12/08/16  Yes Kathrine Haddock, NP  empagliflozin (JARDIANCE) 10 MG TABS tablet Take 10 mg by mouth daily with breakfast.   Yes [provider]  ENTRESTO 49-51 MG TAKE 1 TABLET BY MOUTH TWICE A DAY 06/21/19  Yes Haden Suder A, FNP  glimepiride (AMARYL) 4 MG tablet Take 4 mg by mouth daily with breakfast. 02/23/20 02/22/21 Yes [provider]  ipratropium-albuterol  (DUONEB) 0.5-2.5 (3) MG/3ML SOLN Take 3 mLs by nebulization in the morning, at noon, and at bedtime.   Yes [provider]  levocetirizine (XYZAL) 5 MG tablet Take 5 mg by mouth every evening. 11/10/18  Yes [provider]  metFORMIN (GLUCOPHAGE) 1000 MG tablet TAKE 1 TABLET (1,000 MG TOTAL) BY MOUTH 2 (TWO) TIMES DAILY WITH A MEAL. Patient taking differently: Take 1,000 mg by mouth in the morning and at bedtime. 09/12/16  Yes Kathrine Haddock, NP  metoprolol succinate (TOPROL-XL) 100 MG 24 hr tablet TAKE 1 TABLET BY MOUTH EVERY DAY 07/15/20  Yes Darylene Price A, FNP  ondansetron (ZOFRAN ODT) 4 MG disintegrating tablet Take 1 tablet (4 mg total) by mouth every 8 (eight) hours as needed. 09/07/19  Yes Alfred Levins, [place], MD  [facility] ULTRA TEST test strip USE TO TEST BLOOD SUGAR ONCE DAILY 10/07/16  Yes Johnson, Megan P, DO  pantoprazole (PROTONIX) 40 MG tablet Take 1 tablet (40 mg total) by mouth 2 (two) times daily before a meal. 12/13/18 02/14/21 Yes Vanga, Tally Due, MD  promethazine (PHENERGAN) 25 MG tablet Take 1 tablet (25 mg total) by mouth every 6 (six)  hours as needed for nausea or vomiting. 07/18/20  Yes Chrystal, Eulas Post, MD  spironolactone (ALDACTONE) 25 MG tablet TAKE 1/2 TABLET BY MOUTH EVERY DAY Patient taking differently: Take 12.5 mg by mouth daily. 05/02/19  Yes Khristin Keleher, Otila Kluver A, FNP  sucralfate (CARAFATE) 1 g tablet Take 1 g by mouth 4 (four) times daily. 05/15/17 02/14/21 Yes [provider]  torsemide (DEMADEX) 20 MG tablet Take 1 tablet by mouth daily. 08/27/20 08/27/21 Yes [provider]  traMADol (ULTRAM) 50 MG tablet Take 50 mg by mouth 2 (two) times daily as needed. 07/20/20  Yes [provider]  TRULICITY 1.5 YM/4.1RA SOPN Inject 0.5 mg into the skin every Sunday. 08/30/19  Yes [provider]  predniSONE (DELTASONE) 50 MG tablet Take 1 tablet (50 mg total) by mouth daily with breakfast. Patient not taking: Reported on 09/18/2020 09/10/20    Lavonia Drafts, MD    Review of Systems  Constitutional: Negative for appetite change, fatigue and fever.  HENT: Negative for congestion, postnasal drip and sore throat.   Eyes: Negative.   Respiratory: Positive for cough and wheezing. Negative for chest tightness and shortness of breath.   Cardiovascular: Negative for chest pain, palpitations and leg swelling.  Gastrointestinal: Negative for abdominal distention and abdominal pain.  Endocrine: Negative.   Genitourinary: Negative.   Musculoskeletal: Positive for back pain. Negative for neck pain.  Skin: Negative.   Allergic/Immunologic: Negative.   Neurological: Negative for dizziness, light-headedness and headaches.  Hematological: Negative for adenopathy. Does not bruise/bleed easily.  Psychiatric/Behavioral: Negative for dysphoric mood and sleep disturbance ( sleeping on 2 pillows). The patient is not nervous/anxious.    Vitals:   09/18/20 0843  BP: 104/71  Pulse: 83  Resp: 18  SpO2: 97%  Weight: 131 lb 8 oz (59.6 kg)  Height: '5\' 4"'  (1.626 m)   Wt Readings from Last 3 Encounters:  09/18/20 131 lb 8 oz (59.6 kg)  09/10/20 143 lb (64.9 kg)  08/29/20 140 lb 14.4 oz (63.9 kg)   Lab Results  Component Value Date   CREATININE 0.99 09/10/2020   CREATININE 1.02 09/10/2020   CREATININE 0.68 08/27/2020    Physical Exam Vitals and nursing note reviewed.  Constitutional:      Appearance: Normal appearance. John Frank is well-developed.  HENT:     Head: Normocephalic and atraumatic.  Neck:     Vascular: No JVD.  Cardiovascular:     Rate and Rhythm: Normal rate and regular rhythm.  Pulmonary:     Effort: Pulmonary effort is normal. No respiratory distress.     Breath sounds: No wheezing, rhonchi or rales.  Abdominal:     General: There is no distension.     Palpations: Abdomen is soft.     Tenderness: There is no abdominal tenderness.  Musculoskeletal:        General: No tenderness.     Cervical back: Normal range of motion  and neck supple.     Right lower leg: No edema.     Left lower leg: No edema.     Comments: Wearing back brace  Skin:    General: Skin is warm and dry.  Neurological:     General: No focal deficit present.     Mental Status: John Frank is alert and oriented to person, place, and time.  Psychiatric:        Mood and Affect: Mood normal.        Behavior: Behavior normal.        Thought Content:  Thought content normal.    Assessment & Plan:  1: Chronic heart failure with reduced ejection fraction- - NYHA class II - euvolemic today - not weighing daily but does have scales; encouraged to resume daily weight so that John Frank can call for an overnight weight gain of >2 pounds or a weekly weight gain of >5 pounds.  - weight down 8 pounds since last visit 3 weeks ago - not adding salt; using Mrs. Dash; admits that John Frank's not reading food labels; reminded him to keep his daily sodium intake to <2060m a day. Does eat quite a bit of processed food and carbs; rinses canned vegetables  - saw cardiology (Dema Severin 07/20/20 - BNP 08/27/20 was 822.9 - currently has pacemaker/defibrillator - PharmD reconciled medications with the patient  2: HTN- - BP looks good today - saw PCP ([place]) 07/02/20  - BMP 09/10/20 reviewed and showed sodium 128, potassium 5.1, creatinine 0.99 and GFR >60 - will recheck BMP today  3: Diabetes- - saw endocrinology (Honor Junes 02/23/20; returns next week - A1c 06/07/20 was 81.6%- not using trulicity consistently; emphasized that at his next endocrinology appointment to discuss this as his glucose levels are elevated  4: COPD/ Tobacco use- - saw pulmonologist (Raul Del 08/17/20 - currently smoking 4-5 cigarettes daily; more if John Frank's stressed or upset - complete cessation discussed for 3 minutes with patient, patient not interested in stopping smoking at this time.  - saw radiation oncologist (CYeager 08/29/20; has upcoming CT in July   Medication bottles were reviewed.   Return in 2  months or sooner for any questions/problems before then.

## 2020-09-18 NOTE — Patient Instructions (Addendum)
Resume weighing daily and call for an overnight weight gain of > 2 pounds or a weekly weight gain of >5 pounds.   Bring ALL medications including any over the counter or vitamins to every visit.

## 2020-09-22 ENCOUNTER — Encounter: Payer: Self-pay | Admitting: Emergency Medicine

## 2020-09-22 ENCOUNTER — Emergency Department: Payer: Medicare Other

## 2020-09-22 ENCOUNTER — Inpatient Hospital Stay
Admission: EM | Admit: 2020-09-22 | Discharge: 2020-09-24 | DRG: 193 | Disposition: A | Discharge status: Home / Self Care | Payer: Medicare Other | Attending: Hospitalist | Admitting: Hospitalist

## 2020-09-22 ENCOUNTER — Other Ambulatory Visit: Payer: Self-pay

## 2020-09-22 ENCOUNTER — Inpatient Hospital Stay: Payer: Medicare Other

## 2020-09-22 DIAGNOSIS — Z7984 Long term (current) use of oral hypoglycemic drugs: Secondary | ICD-10-CM

## 2020-09-22 DIAGNOSIS — Z95810 Presence of automatic (implantable) cardiac defibrillator: Secondary | ICD-10-CM

## 2020-09-22 DIAGNOSIS — Z7902 Long term (current) use of antithrombotics/antiplatelets: Secondary | ICD-10-CM

## 2020-09-22 DIAGNOSIS — J449 Chronic obstructive pulmonary disease, unspecified: Secondary | ICD-10-CM | POA: Diagnosis present

## 2020-09-22 DIAGNOSIS — I255 Ischemic cardiomyopathy: Secondary | ICD-10-CM | POA: Diagnosis present

## 2020-09-22 DIAGNOSIS — Z833 Family history of diabetes mellitus: Secondary | ICD-10-CM | POA: Diagnosis not present

## 2020-09-22 DIAGNOSIS — F17210 Nicotine dependence, cigarettes, uncomplicated: Secondary | ICD-10-CM | POA: Diagnosis present

## 2020-09-22 DIAGNOSIS — Z20822 Contact with and (suspected) exposure to covid-19: Secondary | ICD-10-CM | POA: Diagnosis present

## 2020-09-22 DIAGNOSIS — K219 Gastro-esophageal reflux disease without esophagitis: Secondary | ICD-10-CM | POA: Diagnosis present

## 2020-09-22 DIAGNOSIS — E119 Type 2 diabetes mellitus without complications: Secondary | ICD-10-CM

## 2020-09-22 DIAGNOSIS — I5022 Chronic systolic (congestive) heart failure: Secondary | ICD-10-CM | POA: Diagnosis present

## 2020-09-22 DIAGNOSIS — J101 Influenza due to other identified influenza virus with other respiratory manifestations: Principal | ICD-10-CM | POA: Diagnosis present

## 2020-09-22 DIAGNOSIS — Z7982 Long term (current) use of aspirin: Secondary | ICD-10-CM

## 2020-09-22 DIAGNOSIS — Z955 Presence of coronary angioplasty implant and graft: Secondary | ICD-10-CM

## 2020-09-22 DIAGNOSIS — J439 Emphysema, unspecified: Secondary | ICD-10-CM | POA: Diagnosis present

## 2020-09-22 DIAGNOSIS — K7460 Unspecified cirrhosis of liver: Secondary | ICD-10-CM | POA: Diagnosis present

## 2020-09-22 DIAGNOSIS — F32A Depression, unspecified: Secondary | ICD-10-CM | POA: Diagnosis present

## 2020-09-22 DIAGNOSIS — Z8249 Family history of ischemic heart disease and other diseases of the circulatory system: Secondary | ICD-10-CM | POA: Diagnosis not present

## 2020-09-22 DIAGNOSIS — J9600 Acute respiratory failure, unspecified whether with hypoxia or hypercapnia: Secondary | ICD-10-CM | POA: Diagnosis present

## 2020-09-22 DIAGNOSIS — I2510 Atherosclerotic heart disease of native coronary artery without angina pectoris: Secondary | ICD-10-CM | POA: Diagnosis present

## 2020-09-22 DIAGNOSIS — G4730 Sleep apnea, unspecified: Secondary | ICD-10-CM | POA: Diagnosis present

## 2020-09-22 DIAGNOSIS — I252 Old myocardial infarction: Secondary | ICD-10-CM | POA: Diagnosis not present

## 2020-09-22 DIAGNOSIS — Z7952 Long term (current) use of systemic steroids: Secondary | ICD-10-CM

## 2020-09-22 DIAGNOSIS — J9601 Acute respiratory failure with hypoxia: Secondary | ICD-10-CM | POA: Diagnosis present

## 2020-09-22 DIAGNOSIS — I110 Hypertensive heart disease with heart failure: Secondary | ICD-10-CM | POA: Diagnosis present

## 2020-09-22 DIAGNOSIS — C3431 Malignant neoplasm of lower lobe, right bronchus or lung: Secondary | ICD-10-CM | POA: Diagnosis present

## 2020-09-22 DIAGNOSIS — E785 Hyperlipidemia, unspecified: Secondary | ICD-10-CM | POA: Diagnosis present

## 2020-09-22 DIAGNOSIS — I447 Left bundle-branch block, unspecified: Secondary | ICD-10-CM | POA: Diagnosis present

## 2020-09-22 DIAGNOSIS — Z923 Personal history of irradiation: Secondary | ICD-10-CM | POA: Diagnosis not present

## 2020-09-22 DIAGNOSIS — I700 Atherosclerosis of aorta: Secondary | ICD-10-CM | POA: Diagnosis present

## 2020-09-22 DIAGNOSIS — Z79899 Other long term (current) drug therapy: Secondary | ICD-10-CM

## 2020-09-22 DIAGNOSIS — R918 Other nonspecific abnormal finding of lung field: Secondary | ICD-10-CM | POA: Diagnosis present

## 2020-09-22 DIAGNOSIS — I081 Rheumatic disorders of both mitral and tricuspid valves: Secondary | ICD-10-CM | POA: Diagnosis present

## 2020-09-22 DIAGNOSIS — Z720 Tobacco use: Secondary | ICD-10-CM | POA: Diagnosis present

## 2020-09-22 DIAGNOSIS — Z7951 Long term (current) use of inhaled steroids: Secondary | ICD-10-CM

## 2020-09-22 LAB — COMPREHENSIVE METABOLIC PANEL
ALT: 23 U/L (ref 0–44)
AST: 26 U/L (ref 15–41)
Albumin: 3.8 g/dL (ref 3.5–5.0)
Alkaline Phosphatase: 111 U/L (ref 38–126)
Anion gap: 8 (ref 5–15)
BUN: 25 mg/dL — ABNORMAL HIGH (ref 8–23)
CO2: 28 mmol/L (ref 22–32)
Calcium: 8.7 mg/dL — ABNORMAL LOW (ref 8.9–10.3)
Chloride: 102 mmol/L (ref 98–111)
Creatinine, Ser: 0.69 mg/dL (ref 0.61–1.24)
GFR, Estimated: >60 mL/min (ref >60)
Glucose, Bld: 172 mg/dL — ABNORMAL HIGH (ref 70–99)
Potassium: 4.7 mmol/L (ref 3.5–5.1)
Sodium: 138 mmol/L (ref 135–145)
Total Bilirubin: 0.9 mg/dL (ref 0.3–1.2)
Total Protein: 6.8 g/dL (ref 6.5–8.1)

## 2020-09-22 LAB — CBC WITH DIFFERENTIAL/PLATELET
Abs Immature Granulocytes: 0.03 10*3/uL (ref 0.00–0.07)
Basophils Absolute: 0 10*3/uL (ref 0.0–0.1)
Basophils Relative: 0 %
Eosinophils Absolute: 0 10*3/uL (ref 0.0–0.5)
Eosinophils Relative: 0 %
HCT: 40.8 % (ref 39.0–52.0)
Hemoglobin: 12.7 g/dL — ABNORMAL LOW (ref 13.0–17.0)
Immature Granulocytes: 1 %
Lymphocytes Relative: 6 %
Lymphs Abs: 0.3 10*3/uL — ABNORMAL LOW (ref 0.7–4.0)
MCH: 29.3 pg (ref 26.0–34.0)
MCHC: 31.1 g/dL (ref 30.0–36.0)
MCV: 94 fL (ref 80.0–100.0)
Monocytes Absolute: 0.7 10*3/uL (ref 0.1–1.0)
Monocytes Relative: 13 %
Neutro Abs: 4.6 10*3/uL (ref 1.7–7.7)
Neutrophils Relative %: 80 %
Platelets: 160 10*3/uL (ref 150–400)
RBC: 4.34 MIL/uL (ref 4.22–5.81)
RDW: 18 % — ABNORMAL HIGH (ref 11.5–15.5)
WBC: 5.7 10*3/uL (ref 4.0–10.5)
nRBC: 0 % (ref 0.0–0.2)

## 2020-09-22 LAB — GLUCOSE, CAPILLARY
Glucose-Capillary: 135 mg/dL — ABNORMAL HIGH (ref 70–99)
Glucose-Capillary: 226 mg/dL — ABNORMAL HIGH (ref 70–99)

## 2020-09-22 LAB — LACTIC ACID, PLASMA
Lactic Acid, Venous: 1.3 mmol/L (ref 0.5–1.9)
Lactic Acid, Venous: 1.1 mmol/L (ref 0.5–1.9)

## 2020-09-22 LAB — TROPONIN I (HIGH SENSITIVITY): Troponin I (High Sensitivity): 18 ng/L — ABNORMAL HIGH (ref <18)

## 2020-09-22 LAB — RESP PANEL BY RT-PCR (FLU A&B, COVID) ARPGX2
Influenza A by PCR: POSITIVE — AB
Influenza B by PCR: NEGATIVE
SARS Coronavirus 2 by RT PCR: NEGATIVE

## 2020-09-22 LAB — BLOOD GAS, VENOUS
Bicarbonate: 30.5 mmol/L — ABNORMAL HIGH (ref 20.0–28.0)
pCO2, Ven: 54 mmHg (ref 44.0–60.0)
pH, Ven: 7.36 (ref 7.250–7.430)

## 2020-09-22 LAB — BRAIN NATRIURETIC PEPTIDE: B Natriuretic Peptide: 2195.9 pg/mL — ABNORMAL HIGH (ref 0.0–100.0)

## 2020-09-22 MED ORDER — ASPIRIN EC 81 MG PO TBEC
81.0000 mg | DELAYED_RELEASE_TABLET | Freq: Every day | ORAL | Status: DC
  Administered 2020-09-22 – 2020-09-24 (×3): 81 mg via ORAL
  Filled 2020-09-22 (×3): qty 1

## 2020-09-22 MED ORDER — SODIUM CHLORIDE 0.9% FLUSH
3.0000 mL | Freq: Two times a day (BID) | INTRAVENOUS | Status: DC
  Administered 2020-09-22 – 2020-09-24 (×4): 3 mL via INTRAVENOUS

## 2020-09-22 MED ORDER — PREDNISONE 20 MG PO TABS: 40.0000 mg | ORAL_TABLET | Freq: Every day | ORAL | Status: DC

## 2020-09-22 MED ORDER — CLOPIDOGREL BISULFATE 75 MG PO TABS
75.0000 mg | ORAL_TABLET | Freq: Every day | ORAL | Status: DC
  Administered 2020-09-22 – 2020-09-24 (×3): 75 mg via ORAL
  Filled 2020-09-22 (×3): qty 1

## 2020-09-22 MED ORDER — SPIRONOLACTONE 25 MG PO TABS
12.5000 mg | ORAL_TABLET | Freq: Every day | ORAL | Status: DC
  Administered 2020-09-22 – 2020-09-24 (×3): 12.5 mg via ORAL
  Filled 2020-09-22: qty 1
  Filled 2020-09-22: qty 0.5
  Filled 2020-09-22: qty 1
  Filled 2020-09-22 (×2): qty 0.5
  Filled 2020-09-22: qty 1

## 2020-09-22 MED ORDER — IPRATROPIUM-ALBUTEROL 0.5-2.5 (3) MG/3ML IN SOLN: 3.0000 mL | Freq: Once | RESPIRATORY_TRACT | Status: DC

## 2020-09-22 MED ORDER — SUCRALFATE 1 G PO TABS
1.0000 g | ORAL_TABLET | Freq: Four times a day (QID) | ORAL | Status: DC
  Administered 2020-09-22 – 2020-09-24 (×7): 1 g via ORAL
  Filled 2020-09-22 (×7): qty 1

## 2020-09-22 MED ORDER — ATORVASTATIN CALCIUM 20 MG PO TABS
40.0000 mg | ORAL_TABLET | Freq: Every day | ORAL | Status: DC
  Administered 2020-09-22 – 2020-09-24 (×3): 40 mg via ORAL
  Filled 2020-09-22 (×3): qty 2

## 2020-09-22 MED ORDER — METHYLPREDNISOLONE SODIUM SUCC 125 MG IJ SOLR
125.0000 mg | Freq: Once | INTRAMUSCULAR | Status: AC
  Administered 2020-09-22: 125 mg via INTRAVENOUS
  Filled 2020-09-22: qty 2

## 2020-09-22 MED ORDER — LEVOCETIRIZINE DIHYDROCHLORIDE 5 MG PO TABS: 5.0000 mg | ORAL_TABLET | Freq: Every evening | ORAL | Status: DC

## 2020-09-22 MED ORDER — LORATADINE 10 MG PO TABS
10.0000 mg | ORAL_TABLET | Freq: Every day | ORAL | Status: DC
  Administered 2020-09-23 – 2020-09-24 (×2): 10 mg via ORAL
  Filled 2020-09-22 (×2): qty 1

## 2020-09-22 MED ORDER — METHYLPREDNISOLONE SODIUM SUCC 40 MG IJ SOLR
40.0000 mg | Freq: Two times a day (BID) | INTRAMUSCULAR | Status: AC
  Administered 2020-09-22 – 2020-09-23 (×2): 40 mg via INTRAVENOUS
  Filled 2020-09-22 (×2): qty 1

## 2020-09-22 MED ORDER — PANTOPRAZOLE SODIUM 40 MG PO TBEC
40.0000 mg | DELAYED_RELEASE_TABLET | Freq: Two times a day (BID) | ORAL | Status: DC
  Administered 2020-09-22 – 2020-09-24 (×4): 40 mg via ORAL
  Filled 2020-09-22 (×4): qty 1

## 2020-09-22 MED ORDER — SODIUM CHLORIDE 0.9 % IV SOLN
250.0000 mL | INTRAVENOUS | Status: DC | PRN
  Administered 2020-09-22: 250 mL via INTRAVENOUS

## 2020-09-22 MED ORDER — MOMETASONE FURO-FORMOTEROL FUM 200-5 MCG/ACT IN AERO
2.0000 | INHALATION_SPRAY | Freq: Two times a day (BID) | RESPIRATORY_TRACT | Status: DC
  Administered 2020-09-22 – 2020-09-24 (×4): 2 via RESPIRATORY_TRACT
  Filled 2020-09-22: qty 8.8

## 2020-09-22 MED ORDER — BUPROPION HCL 75 MG PO TABS
75.0000 mg | ORAL_TABLET | Freq: Every day | ORAL | Status: DC
  Administered 2020-09-23 – 2020-09-24 (×2): 75 mg via ORAL
  Filled 2020-09-22 (×3): qty 1

## 2020-09-22 MED ORDER — TORSEMIDE 20 MG PO TABS
20.0000 mg | ORAL_TABLET | Freq: Every day | ORAL | Status: DC
  Administered 2020-09-22 – 2020-09-23 (×2): 20 mg via ORAL
  Filled 2020-09-22 (×2): qty 1

## 2020-09-22 MED ORDER — INSULIN ASPART 100 UNIT/ML IJ SOLN
0.0000 [IU] | Freq: Every day | INTRAMUSCULAR | Status: DC
  Administered 2020-09-22 – 2020-09-23 (×2): 2 [IU] via SUBCUTANEOUS
  Filled 2020-09-22 (×2): qty 1

## 2020-09-22 MED ORDER — SACUBITRIL-VALSARTAN 49-51 MG PO TABS
1.0000 | ORAL_TABLET | Freq: Two times a day (BID) | ORAL | Status: DC
  Administered 2020-09-22 – 2020-09-23 (×3): 1 via ORAL
  Filled 2020-09-22 (×4): qty 1

## 2020-09-22 MED ORDER — ENOXAPARIN SODIUM 40 MG/0.4ML IJ SOSY
40.0000 mg | PREFILLED_SYRINGE | INTRAMUSCULAR | Status: DC
  Administered 2020-09-22 – 2020-09-23 (×2): 40 mg via SUBCUTANEOUS
  Filled 2020-09-22 (×2): qty 0.4

## 2020-09-22 MED ORDER — PREDNISONE 20 MG PO TABS
40.0000 mg | ORAL_TABLET | Freq: Every day | ORAL | Status: DC
  Administered 2020-09-24: 40 mg via ORAL
  Filled 2020-09-22: qty 2

## 2020-09-22 MED ORDER — IPRATROPIUM-ALBUTEROL 0.5-2.5 (3) MG/3ML IN SOLN
3.0000 mL | Freq: Three times a day (TID) | RESPIRATORY_TRACT | Status: DC
  Administered 2020-09-22 – 2020-09-24 (×5): 3 mL via RESPIRATORY_TRACT
  Filled 2020-09-22 (×5): qty 3

## 2020-09-22 MED ORDER — METHYLPREDNISOLONE SODIUM SUCC 40 MG IJ SOLR: 40.0000 mg | Freq: Two times a day (BID) | INTRAMUSCULAR | Status: DC

## 2020-09-22 MED ORDER — SODIUM CHLORIDE 0.9% FLUSH: 3.0000 mL | INTRAVENOUS | Status: DC | PRN

## 2020-09-22 MED ORDER — INSULIN ASPART 100 UNIT/ML IJ SOLN
0.0000 [IU] | Freq: Three times a day (TID) | INTRAMUSCULAR | Status: DC
  Administered 2020-09-23: 8 [IU] via SUBCUTANEOUS
  Administered 2020-09-23: 5 [IU] via SUBCUTANEOUS
  Administered 2020-09-23 – 2020-09-24 (×2): 3 [IU] via SUBCUTANEOUS
  Administered 2020-09-24: 8 [IU] via SUBCUTANEOUS
  Filled 2020-09-22 (×5): qty 1

## 2020-09-22 MED ORDER — TRAMADOL HCL 50 MG PO TABS
50.0000 mg | ORAL_TABLET | Freq: Two times a day (BID) | ORAL | Status: DC | PRN
  Administered 2020-09-22: 50 mg via ORAL
  Filled 2020-09-22: qty 1

## 2020-09-22 MED ORDER — OSELTAMIVIR PHOSPHATE 75 MG PO CAPS
75.0000 mg | ORAL_CAPSULE | Freq: Two times a day (BID) | ORAL | Status: DC
  Administered 2020-09-22 – 2020-09-24 (×4): 75 mg via ORAL
  Filled 2020-09-22 (×5): qty 1

## 2020-09-22 MED ORDER — METOPROLOL SUCCINATE ER 50 MG PO TB24
100.0000 mg | ORAL_TABLET | Freq: Every day | ORAL | Status: DC
  Administered 2020-09-22 – 2020-09-24 (×3): 100 mg via ORAL
  Filled 2020-09-22 (×3): qty 2

## 2020-09-22 MED ORDER — NICOTINE 7 MG/24HR TD PT24
7.0000 mg | MEDICATED_PATCH | Freq: Every day | TRANSDERMAL | Status: DC
  Administered 2020-09-23 – 2020-09-24 (×2): 7 mg via TRANSDERMAL
  Filled 2020-09-22 (×3): qty 1

## 2020-09-22 NOTE — ED Provider Notes (Signed)
[facility] Emergency Department Provider Note   ____________________________________________    I have reviewed the triage vital signs and the nursing notes.   HISTORY  Chief Complaint Weakness     HPI John Frank is a 65 y.o. male with history of COPD, diabetes, CHF, cirrhosis who presents with shortness of breath.  Patient reports he has been feeling short of breath and weak since Monday.  He reports is worsened overnight.  Denies fevers or chills.  No myalgias.  Mild cough occasionally.  Has not take anything for this.  No recent travel.  No calf pain or swelling  Past Medical History:  Diagnosis Date  . AICD (automatic cardioverter/defibrillator) present   . Angina at rest [facility])   . Aortic atherosclerosis ([place])   . CAD (coronary artery disease)    s/p stents in 2017  . Chronic systolic CHF (congestive heart failure) ([place])   . Cirrhosis of liver ([place])   . COPD (chronic obstructive pulmonary disease) ([place])   . Diabetes ([place])   . GERD (gastroesophageal reflux disease)   . Heart attack ([place])   . Hyperlipidemia   . Hypertension   . Ischemic cardiomyopathy   . Paroxysmal ventricular tachycardia ([place])   . Right lower lobe lung mass   . Shortness of breath dyspnea   . Sleep apnea     Patient Active Problem List   Diagnosis Date Noted  . Acute respiratory failure ([place]) 09/22/2020  . Pain due to onychomycosis of toenails of both feet 03/15/2020  . Ulcer of esophagus without bleeding   . Hyperlipidemia associated with type 2 diabetes mellitus ([place]) 02/15/2019  . Gastritis with intestinal metaplasia of stomach   . COPD with acute exacerbation ([place]) 05/25/2018  . Iron deficiency anemia   . Cirrhosis of liver with ascites ([place])   . Chronic systolic heart failure ([place]) 08/31/2017  . CHF exacerbation ([place]) 08/09/2017  . Syncope 01/11/2016  . Tobacco abuse 04/06/2015  . HTN (hypertension) 11/03/2014  . Sleep apnea 10/26/2014  . CAD  (coronary artery disease) 10/26/2014  . GERD (gastroesophageal reflux disease) 10/26/2014  . Polyp of colon 10/26/2014  . Diabetes mellitus without complication ([place]) 41/32/4401  . Hyperlipidemia 10/26/2014  . COPD, severe ([place]) 10/26/2014  . Status cardiac pacemaker 10/26/2014  . Lesion of nasal septum 10/26/2014    Past Surgical History:  Procedure Laterality Date  . CARDIAC PACEMAKER PLACEMENT    . cardiac stents    . CARDIAC SURGERY     defibrillator and pacemaker  . CORONARY ANGIOPLASTY    . ESOPHAGOGASTRODUODENOSCOPY N/A 09/19/2014   Procedure: ESOPHAGOGASTRODUODENOSCOPY (EGD);  Surgeon: Lucilla Lame, MD;  Location: [facility] ENDOSCOPY;  Service: Endoscopy;  Laterality: N/A;  . ESOPHAGOGASTRODUODENOSCOPY (EGD) WITH PROPOFOL N/A 09/28/2017   Procedure: ESOPHAGOGASTRODUODENOSCOPY (EGD) WITH PROPOFOL;  Surgeon: Virgel Manifold, MD;  Location: [facility] ENDOSCOPY;  Service: Endoscopy;  Laterality: N/A;  . ESOPHAGOGASTRODUODENOSCOPY (EGD) WITH PROPOFOL N/A 04/19/2018   Procedure: ESOPHAGOGASTRODUODENOSCOPY (EGD) WITH PROPOFOL with Gastric Mapping;  Surgeon: [name], MD;  Location: [facility] ENDOSCOPY;  Service: Gastroenterology;  Laterality: N/A;  . ESOPHAGOGASTRODUODENOSCOPY (EGD) WITH PROPOFOL N/A 12/13/2018   Procedure: ESOPHAGOGASTRODUODENOSCOPY (EGD) WITH PROPOFOL;  Surgeon: [name], MD;  Location: [facility] ENDOSCOPY;  Service: Gastroenterology;  Laterality: N/A;  . ESOPHAGOGASTRODUODENOSCOPY (EGD) WITH PROPOFOL N/A 03/31/2019   Procedure: ESOPHAGOGASTRODUODENOSCOPY (EGD) WITH PROPOFOL;  Surgeon: Lin Landsman, MD;  Location: [facility] ENDOSCOPY;  Service: Gastroenterology;  Laterality: N/A;  . IMPLANTABLE CARDIOVERTER DEFIBRILLATOR (ICD) GENERATOR CHANGE Left 02/21/2020  Procedure: ICD GENERATOR CHANGE;  Surgeon: Isaias Cowman, MD;  Location: [facility] ORS;  Service: Cardiovascular;  Laterality: Left;  Marland Kitchen VIDEO BRONCHOSCOPY WITH ENDOBRONCHIAL NAVIGATION N/A 06/22/2020   Procedure: VIDEO  BRONCHOSCOPY WITH ENDOBRONCHIAL NAVIGATION;  Surgeon: Ottie Glazier, MD;  Location: [facility] ORS;  Service: Thoracic;  Laterality: N/A;  . VIDEO BRONCHOSCOPY WITH ENDOBRONCHIAL ULTRASOUND N/A 06/22/2020   Procedure: VIDEO BRONCHOSCOPY WITH ENDOBRONCHIAL ULTRASOUND;  Surgeon: Ottie Glazier, MD;  Location: [facility] ORS;  Service: Thoracic;  Laterality: N/A;    Prior to Admission medications   Medication Sig Start Date End Date Taking? Authorizing Provider  aspirin 81 MG EC tablet Take 81 mg by mouth daily.   Yes [provider]  atorvastatin (LIPITOR) 40 MG tablet TAKE 1 TABLET (40 MG TOTAL) BY MOUTH DAILY. Patient taking differently: Take 40 mg by mouth daily. 03/08/18  Yes Cannady, Jolene T, NP  budesonide-formoterol (SYMBICORT) 160-4.5 MCG/ACT inhaler Inhale 2 puffs into the lungs 2 (two) times daily.   Yes [provider]  buPROPion (WELLBUTRIN) 75 MG tablet Take 75 mg by mouth daily. 03/26/16 02/14/21 Yes [provider]  clopidogrel (PLAVIX) 75 MG tablet TAKE 1 TABLET (75 MG TOTAL) BY MOUTH DAILY. 12/08/16  Yes Kathrine Haddock, NP  empagliflozin (JARDIANCE) 10 MG TABS tablet Take 10 mg by mouth daily with breakfast.   Yes [provider]  ENTRESTO 49-51 MG TAKE 1 TABLET BY MOUTH TWICE A DAY 06/21/19  Yes Hackney, Tina A, FNP  glimepiride (AMARYL) 4 MG tablet Take 4 mg by mouth daily with breakfast. 02/23/20 02/22/21 Yes [provider]  ipratropium-albuterol (DUONEB) 0.5-2.5 (3) MG/3ML SOLN Take 3 mLs by nebulization in the morning, at noon, and at bedtime.   Yes [provider]  levocetirizine (XYZAL) 5 MG tablet Take 5 mg by mouth every evening. 11/10/18  Yes [provider]  metFORMIN (GLUCOPHAGE) 1000 MG tablet TAKE 1 TABLET (1,000 MG TOTAL) BY MOUTH 2 (TWO) TIMES DAILY WITH A MEAL. Patient taking differently: Take 1,000 mg by mouth in the morning and at bedtime. 09/12/16  Yes Kathrine Haddock, NP  metoprolol succinate (TOPROL-XL) 100 MG 24  hr tablet TAKE 1 TABLET BY MOUTH EVERY DAY 07/15/20  Yes Darylene Price A, FNP  ondansetron (ZOFRAN ODT) 4 MG disintegrating tablet Take 1 tablet (4 mg total) by mouth every 8 (eight) hours as needed. 09/07/19  Yes Alfred Levins, [place], MD  pantoprazole (PROTONIX) 40 MG tablet Take 1 tablet (40 mg total) by mouth 2 (two) times daily before a meal. 12/13/18 02/14/21 Yes Vanga, Tally Due, MD  promethazine (PHENERGAN) 25 MG tablet Take 1 tablet (25 mg total) by mouth every 6 (six) hours as needed for nausea or vomiting. 07/18/20  Yes Chrystal, Eulas Post, MD  spironolactone (ALDACTONE) 25 MG tablet TAKE 1/2 TABLET BY MOUTH EVERY DAY Patient taking differently: Take 12.5 mg by mouth daily. 05/02/19  Yes Hackney, Otila Kluver A, FNP  sucralfate (CARAFATE) 1 g tablet Take 1 g by mouth 4 (four) times daily. 05/15/17 02/14/21 Yes [provider]  torsemide (DEMADEX) 20 MG tablet Take 1 tablet by mouth daily. 08/27/20 08/27/21 Yes [provider]  traMADol (ULTRAM) 50 MG tablet Take 50 mg by mouth 2 (two) times daily as needed. 07/20/20  Yes [provider]  TRULICITY 1.5 WJ/1.9JY SOPN Inject 0.5 mg into the skin every Sunday. 08/30/19  Yes [provider]  Blood Glucose Monitoring Suppl (ACCU-CHEK GUIDE ME) w/Device KIT  02/23/20   [provider]  ONE TOUCH ULTRA TEST  test strip USE TO TEST BLOOD SUGAR ONCE DAILY 10/07/16   Park Liter P, DO  predniSONE (DELTASONE) 50 MG tablet Take 1 tablet (50 mg total) by mouth daily with breakfast. 09/10/20   Lavonia Drafts, MD     Allergies Patient has no known allergies.  Family History  Problem Relation Age of Onset  . Diabetes Mother   . Heart attack Father 60    Social History Social History   Tobacco Use  . Smoking status: Current Every Day Smoker    Packs/day: 0.25    Years: 40.00    Pack years: 10.00    Types: Cigarettes  . Smokeless tobacco: Never Used  Vaping Use  . Vaping Use: Never used  Substance Use Topics  . Alcohol  use: No  . Drug use: Never    Review of Systems  Constitutional: No fever/chills Eyes: No visual changes.  ENT: No sore throat. Cardiovascular: Denies chest pain. Respiratory: As above Gastrointestinal: No abdominal pain.   Genitourinary: Negative for dysuria. Musculoskeletal: Negative for back pain. Skin: Negative for rash. Neurological: Negative for headaches    ____________________________________________   PHYSICAL EXAM:  VITAL SIGNS: ED Triage Vitals  Enc Vitals Group     BP 09/22/20 1352 101/66     Pulse Rate 09/22/20 1352 95     Resp 09/22/20 1353 18     Temp 09/22/20 1348 98.1 F (36.7 C)     Temp Source 09/22/20 1348 Oral     SpO2 09/22/20 1342 (!) 86 %     Weight 09/22/20 1349 61.2 kg (135 lb)     Height 09/22/20 1349 1.626 m (_0 )     Head Circumference --      Peak Flow --      Pain Score 09/22/20 1349 0     Pain Loc --      Pain Edu? --      Excl. in [place]? --     Constitutional: Alert and oriented.  Nose: No congestion/rhinnorhea. Mouth/Throat: Mucous membranes are moist.    Cardiovascular: Normal rate, regular rhythm.   Good peripheral circulation. Respiratory: Increased respiratory effort with tachypnea, scattered wheezing Gastrointestinal: Soft and nontender. No distention.  No CVA tenderness.  Musculoskeletal: No lower extremity tenderness nor edema.  Warm and well perfused Neurologic:  Normal speech and language. No gross focal neurologic deficits are appreciated.  Skin:  Skin is warm, dry and intact. No rash noted. Psychiatric: Mood and affect are normal. Speech and behavior are normal.  ____________________________________________   LABS (all labs ordered are listed, but only abnormal results are displayed)  Labs Reviewed  COMPREHENSIVE METABOLIC PANEL - Abnormal; Notable for the following components:      Result Value   Glucose, Bld 172 (*)    BUN 25 (*)    Calcium 8.7 (*)    All other components within normal limits  BRAIN  NATRIURETIC PEPTIDE - Abnormal; Notable for the following components:   B Natriuretic Peptide 2,195.9 (*)    All other components within normal limits  BLOOD GAS, VENOUS - Abnormal; Notable for the following components:   Bicarbonate 30.5 (*)    All other components within normal limits  CBC WITH DIFFERENTIAL/PLATELET - Abnormal; Notable for the following components:   Hemoglobin 12.7 (*)    RDW 18.0 (*)    Lymphs Abs 0.3 (*)    All other components within normal limits  TROPONIN I (HIGH SENSITIVITY) - Abnormal; Notable for the following components:   Troponin I (  High Sensitivity) 18 (*)    All other components within normal limits  RESP PANEL BY RT-PCR (FLU A&B, COVID) ARPGX2  LACTIC ACID, PLASMA  CBC WITH DIFFERENTIAL/PLATELET  LACTIC ACID, PLASMA  CBG MONITORING, ED   ____________________________________________  EKG  ED ECG REPORT I, Lavonia Drafts, the attending physician, personally viewed and interpreted this ECG.  Date: 09/22/2020  Rhythm: normal sinus rhythm QRS Axis: normal Intervals: Abnormal, left bundle branch block ST/T Wave abnormalities: normal Narrative Interpretation: no evidence of acute ischemia  ____________________________________________  RADIOLOGY  Chest x-ray viewed by me, no infiltrate ____________________________________________   PROCEDURES  Procedure(s) performed: No  Procedures   Critical Care performed: yes  CRITICAL CARE Performed by: Lavonia Drafts   Total critical care time: 30 minutes  Critical care time was exclusive of separately billable procedures and treating other patients.  Critical care was necessary to treat or prevent imminent or life-threatening deterioration.  Critical care was time spent personally by me on the following activities: development of treatment plan with patient and/or surrogate as well as nursing, discussions with consultants, evaluation of patient's response to treatment, examination of patient,  obtaining history from patient or surrogate, ordering and performing treatments and interventions, ordering and review of laboratory studies, ordering and review of radiographic studies, pulse oximetry and re-evaluation of patient's condition.  ____________________________________________   INITIAL IMPRESSION / ASSESSMENT AND PLAN / ED COURSE  Pertinent labs & imaging results that were available during my care of the patient were reviewed by me and considered in my medical decision making (see chart for details).  Patient presents with shortness of breath as detailed above, room air saturation 86% on arrival.  Started on nasal cannula 4 to 5 L with improvement.  Wheezing on exam suspicious for COPD exacerbation.  Treated with duo nebs, Solu-Medrol  BNP is also significantly elevated however no evidence of edema on chest x-ray.   patient will require admission, have discussed with the hospitalist for admission    ____________________________________________   FINAL CLINICAL IMPRESSION(S) / ED DIAGNOSES  Final diagnoses:  Acute respiratory failure with hypoxia ([place])        Note:  This document was prepared using Dragon voice recognition software and may include unintentional dictation errors.   Lavonia Drafts, MD 09/22/20 [phone]

## 2020-09-22 NOTE — ED Triage Notes (Signed)
Pt to ED via [facility] from home for generalized weakness, headache, cough, and low SpO2. Per EMS, pts SpO2 86% on room air, drops to 80 when coughing. Pt denies known COVID exposure. Pt currently on 6 liters of O2 via [place]. PT is in NAD

## 2020-09-22 NOTE — H&P (Signed)
History and Physical    John Frank CBJ:[id number] DOB: Oct 09, 1955 DOA: 09/22/2020  PCP: Idelle Crouch, MD   Patient coming from: Home  I have personally briefly reviewed patient's old medical records in [place]  Chief Complaint: Weakness  HPI: John Frank is a 65 y.o. male with medical history significant for chronic systolic heart failure, cardiomyopathy status post AICD insertion, coronary artery disease, diabetes mellitus, GERD, COPD and liver cirrhosis who presents to the ER via EMS for evaluation of generalized weakness, headache, cough which is productive of clear phlegm and worsening shortness of breath. Per EMS patient had room air pulse oximetry of 86% which dropped to 80% when he coughs.  Patient was placed on 6 L of oxygen with improvement in his pulse oximetry to 92% and was transported to the ER. He received 2 doses of the COVID-19 vaccine but did not get his booster shot.  He denies any known COVID exposure. He denies having any chest pain, no abdominal pain, no nausea, no vomiting, no diarrhea, no urinary symptoms, no blurred vision palpitations, no diaphoresis, no lower extremity swelling, no dizziness, no lightheadedness, no fever, no chills. Venous blood gas 7.36/54/30.5 BNP 2195, lactic acid 1.3, white count 5.7, hemoglobin 12.7, hematocrit 40.8, MCV 94.0, RDW 18.0, platelet count 160 Respiratory viral panel is positive for influenza A Chest x-ray reviewed by me shows no evidence for acute cardiopulmonary abnormality. CT scan of the chest without contrast shows approximate 2.3 cm suspicious mass involving the RIGHT LOWER LOBE medially, unchanged since the 06/30/2020 CT but increased in size since the 04/02/2020 CT. No new/acute cardiopulmonary disease. Stable moderate cardiomegaly. Three-vessel coronary atherosclerosis. Aortic Atherosclerosis (ICD10-I70.0) and Emphysema (ICD10-J43.9). Twelve-lead EKG reviewed by me shows sinus arrhythmia with an  incomplete left bundle branch block.   ED Course: Patient is a 65 year old Caucasian male with multiple medical problems who was brought into the ER for evaluation of weakness, cough and worsening shortness of breath.  He was hypoxic in the field and is currently on 6 L of oxygen to maintain pulse oximetry greater than 92%.  Patient is positive for influenza A.  He will be admitted to the hospital for further evaluation and treatment.   Review of Systems: As per HPI otherwise all other systems reviewed and negative.    Past Medical History:  Diagnosis Date  . AICD (automatic cardioverter/defibrillator) present   . Angina at rest [facility])   . Aortic atherosclerosis ([place])   . CAD (coronary artery disease)    s/p stents in 2017  . Chronic systolic CHF (congestive heart failure) ([place])   . Cirrhosis of liver ([place])   . COPD (chronic obstructive pulmonary disease) ([place])   . Diabetes ([place])   . GERD (gastroesophageal reflux disease)   . Heart attack ([place])   . Hyperlipidemia   . Hypertension   . Ischemic cardiomyopathy   . Paroxysmal ventricular tachycardia ([place])   . Right lower lobe lung mass   . Shortness of breath dyspnea   . Sleep apnea     Past Surgical History:  Procedure Laterality Date  . CARDIAC PACEMAKER PLACEMENT    . cardiac stents    . CARDIAC SURGERY     defibrillator and pacemaker  . CORONARY ANGIOPLASTY    . ESOPHAGOGASTRODUODENOSCOPY N/A 09/19/2014   Procedure: ESOPHAGOGASTRODUODENOSCOPY (EGD);  Surgeon: Lucilla Lame, MD;  Location: [facility] ENDOSCOPY;  Service: Endoscopy;  Laterality: N/A;  . ESOPHAGOGASTRODUODENOSCOPY (EGD) WITH PROPOFOL N/A 09/28/2017   Procedure: ESOPHAGOGASTRODUODENOSCOPY (EGD) WITH  PROPOFOL;  Surgeon: Virgel Manifold, MD;  Location: [facility] ENDOSCOPY;  Service: Endoscopy;  Laterality: N/A;  . ESOPHAGOGASTRODUODENOSCOPY (EGD) WITH PROPOFOL N/A 04/19/2018   Procedure: ESOPHAGOGASTRODUODENOSCOPY (EGD) WITH PROPOFOL with Gastric Mapping;  Surgeon: [name], MD;  Location: [facility] ENDOSCOPY;  Service: Gastroenterology;  Laterality: N/A;  . ESOPHAGOGASTRODUODENOSCOPY (EGD) WITH PROPOFOL N/A 12/13/2018   Procedure: ESOPHAGOGASTRODUODENOSCOPY (EGD) WITH PROPOFOL;  Surgeon: [name], MD;  Location: [facility] ENDOSCOPY;  Service: Gastroenterology;  Laterality: N/A;  . ESOPHAGOGASTRODUODENOSCOPY (EGD) WITH PROPOFOL N/A 03/31/2019   Procedure: ESOPHAGOGASTRODUODENOSCOPY (EGD) WITH PROPOFOL;  Surgeon: Lin Landsman, MD;  Location: [facility] ENDOSCOPY;  Service: Gastroenterology;  Laterality: N/A;  . IMPLANTABLE CARDIOVERTER DEFIBRILLATOR (ICD) GENERATOR CHANGE Left 02/21/2020   Procedure: ICD GENERATOR CHANGE;  Surgeon: Isaias Cowman, MD;  Location: [facility] ORS;  Service: Cardiovascular;  Laterality: Left;  Marland Kitchen VIDEO BRONCHOSCOPY WITH ENDOBRONCHIAL NAVIGATION N/A 06/22/2020   Procedure: VIDEO BRONCHOSCOPY WITH ENDOBRONCHIAL NAVIGATION;  Surgeon: Ottie Glazier, MD;  Location: [facility] ORS;  Service: Thoracic;  Laterality: N/A;  . VIDEO BRONCHOSCOPY WITH ENDOBRONCHIAL ULTRASOUND N/A 06/22/2020   Procedure: VIDEO BRONCHOSCOPY WITH ENDOBRONCHIAL ULTRASOUND;  Surgeon: Ottie Glazier, MD;  Location: [facility] ORS;  Service: Thoracic;  Laterality: N/A;     reports that he has been smoking cigarettes. He has a 10.00 pack-year smoking history. He has never used smokeless tobacco. He reports that he does not drink alcohol and does not use drugs.  No Known Allergies  Family History  Problem Relation Age of Onset  . Diabetes Mother   . Heart attack Father 62     Prior to Admission medications   Medication Sig Start Date End Date Taking? Authorizing Provider  aspirin 81 MG EC tablet Take 81 mg by mouth daily.   Yes [provider]  atorvastatin (LIPITOR) 40 MG tablet TAKE 1 TABLET (40 MG TOTAL) BY MOUTH DAILY. Patient taking differently: Take 40 mg by mouth daily. 03/08/18  Yes Cannady, Jolene T, NP  budesonide-formoterol (SYMBICORT) 160-4.5 MCG/ACT inhaler  Inhale 2 puffs into the lungs 2 (two) times daily.   Yes [provider]  buPROPion (WELLBUTRIN) 75 MG tablet Take 75 mg by mouth daily. 03/26/16 02/14/21 Yes [provider]  clopidogrel (PLAVIX) 75 MG tablet TAKE 1 TABLET (75 MG TOTAL) BY MOUTH DAILY. 12/08/16  Yes Kathrine Haddock, NP  empagliflozin (JARDIANCE) 10 MG TABS tablet Take 10 mg by mouth daily with breakfast.   Yes [provider]  ENTRESTO 49-51 MG TAKE 1 TABLET BY MOUTH TWICE A DAY 06/21/19  Yes Hackney, Tina A, FNP  glimepiride (AMARYL) 4 MG tablet Take 4 mg by mouth daily with breakfast. 02/23/20 02/22/21 Yes [provider]  ipratropium-albuterol (DUONEB) 0.5-2.5 (3) MG/3ML SOLN Take 3 mLs by nebulization in the morning, at noon, and at bedtime.   Yes [provider]  levocetirizine (XYZAL) 5 MG tablet Take 5 mg by mouth every evening. 11/10/18  Yes [provider]  metFORMIN (GLUCOPHAGE) 1000 MG tablet TAKE 1 TABLET (1,000 MG TOTAL) BY MOUTH 2 (TWO) TIMES DAILY WITH A MEAL. Patient taking differently: Take 1,000 mg by mouth in the morning and at bedtime. 09/12/16  Yes Kathrine Haddock, NP  metoprolol succinate (TOPROL-XL) 100 MG 24 hr tablet TAKE 1 TABLET BY MOUTH EVERY DAY 07/15/20  Yes Darylene Price A, FNP  ondansetron (ZOFRAN ODT) 4 MG disintegrating tablet Take 1 tablet (4 mg total) by mouth every 8 (eight) hours as needed. 09/07/19  Yes Alfred Levins, [place], MD  pantoprazole (PROTONIX) 40 MG  tablet Take 1 tablet (40 mg total) by mouth 2 (two) times daily before a meal. 12/13/18 02/14/21 Yes Vanga, Tally Due, MD  promethazine (PHENERGAN) 25 MG tablet Take 1 tablet (25 mg total) by mouth every 6 (six) hours as needed for nausea or vomiting. 07/18/20  Yes Chrystal, Eulas Post, MD  spironolactone (ALDACTONE) 25 MG tablet TAKE 1/2 TABLET BY MOUTH EVERY DAY Patient taking differently: Take 12.5 mg by mouth daily. 05/02/19  Yes Hackney, Otila Kluver A, FNP  sucralfate (CARAFATE) 1 g tablet Take 1 g by mouth 4  (four) times daily. 05/15/17 02/14/21 Yes [provider]  torsemide (DEMADEX) 20 MG tablet Take 1 tablet by mouth daily. 08/27/20 08/27/21 Yes [provider]  traMADol (ULTRAM) 50 MG tablet Take 50 mg by mouth 2 (two) times daily as needed. 07/20/20  Yes [provider]  TRULICITY 1.5 JQ/3.0SP SOPN Inject 0.5 mg into the skin every Sunday. 08/30/19  Yes [provider]  Blood Glucose Monitoring Suppl (ACCU-CHEK GUIDE ME) w/Device KIT  02/23/20   [provider]  ONE TOUCH ULTRA TEST test strip USE TO TEST BLOOD SUGAR ONCE DAILY 10/07/16   Wynetta Emery, Megan P, DO  predniSONE (DELTASONE) 50 MG tablet Take 1 tablet (50 mg total) by mouth daily with breakfast. 09/10/20   Lavonia Drafts, MD    Physical Exam: Vitals:   09/22/20 1352 09/22/20 1353 09/22/20 1500 09/22/20 1617  BP: 101/66  116/81 120/74  Pulse: 95  100 100  Resp:  18 (!) 22 20  Temp:   98 F (36.7 C) (!) 97.5 F (36.4 C)  TempSrc:   Oral Oral  SpO2:   98% 100%  Weight:      Height:         Vitals:   09/22/20 1352 09/22/20 1353 09/22/20 1500 09/22/20 1617  BP: 101/66  116/81 120/74  Pulse: 95  100 100  Resp:  18 (!) 22 20  Temp:   98 F (36.7 C) (!) 97.5 F (36.4 C)  TempSrc:   Oral Oral  SpO2:   98% 100%  Weight:      Height:          Constitutional: Alert and oriented x 3 .  Acutely ill-appearing.  Has conversational dyspnea HEENT:      Head: Normocephalic and atraumatic.         Eyes: PERLA, EOMI, Conjunctivae are normal. Sclera is non-icteric.       Mouth/Throat: Mucous membranes are moist.       Neck: Supple with no signs of meningismus. Cardiovascular: Regular rate and rhythm. No murmurs, gallops, or rubs. 2+ symmetrical distal pulses are present . No JVD. No  LE edema Respiratory: Respiratory effort normal .decreased air entry in both lung fields.  Scattered wheezes.   Gastrointestinal: Soft, non tender, and non distended with positive bowel sounds.  Genitourinary: No  CVA tenderness. Musculoskeletal: Nontender with normal range of motion in all extremities. No cyanosis, or erythema of extremities. Neurologic:  Face is symmetric. Moving all extremities. No gross focal neurologic deficits . Skin: Skin is warm, dry.  No rash or ulcers Psychiatric: Mood and affect are normal    Labs on Admission: I have personally reviewed following labs and imaging studies  CBC: Recent Labs  Lab 09/22/20 1509  WBC 5.7  NEUTROABS 4.6  HGB 12.7*  HCT 40.8  MCV 94.0  PLT 233   Basic Metabolic Panel: Recent Labs  Lab 09/18/20 0924 09/22/20 1354  NA 133* 138  K  3.9 4.7  CL 99 102  CO2 25 28  GLUCOSE 184* 172*  BUN 19 25*  CREATININE 0.77 0.69  CALCIUM 8.7* 8.7*   GFR: Estimated Creatinine Clearance: 77.1 mL/min (by C-G formula based on SCr of 0.69 mg/dL). Liver Function Tests: Recent Labs  Lab 09/22/20 1354  AST 26  ALT 23  ALKPHOS 111  BILITOT 0.9  PROT 6.8  ALBUMIN 3.8   No results for input(s): LIPASE, AMYLASE in the last 168 hours. No results for input(s): AMMONIA in the last 168 hours. Coagulation Profile: No results for input(s): INR, PROTIME in the last 168 hours. Cardiac Enzymes: No results for input(s): CKTOTAL, CKMB, CKMBINDEX, TROPONINI in the last 168 hours. BNP (last 3 results) No results for input(s): PROBNP in the last 8760 hours. HbA1C: No results for input(s): HGBA1C in the last 72 hours. CBG: Recent Labs  Lab 09/22/20 1706  GLUCAP 135*   Lipid Profile: No results for input(s): CHOL, HDL, LDLCALC, TRIG, CHOLHDL, LDLDIRECT in the last 72 hours. Thyroid Function Tests: No results for input(s): TSH, T4TOTAL, FREET4, T3FREE, THYROIDAB in the last 72 hours. Anemia Panel: No results for input(s): VITAMINB12, FOLATE, FERRITIN, TIBC, IRON, RETICCTPCT in the last 72 hours. Urine analysis:    Component Value Date/Time   COLORURINE YELLOW (A) 06/30/2020 2151   APPEARANCEUR CLEAR (A) 06/30/2020 2151   LABSPEC 1.023 06/30/2020  2151   PHURINE 6.0 06/30/2020 2151   GLUCOSEU >=500 (A) 06/30/2020 2151   HGBUR NEGATIVE 06/30/2020 2151   BILIRUBINUR NEGATIVE 06/30/2020 2151   [place] NEGATIVE 06/30/2020 2151   PROTEINUR NEGATIVE 06/30/2020 2151   NITRITE NEGATIVE 06/30/2020 2151   LEUKOCYTESUR NEGATIVE 06/30/2020 2151    Radiological Exams on Admission: CT CHEST WO CONTRAST  Result Date: 09/22/2020 CLINICAL DATA:  65 year old presenting with cough, headache, generalized weakness and hypoxia (oxygen saturation 86% on room air which drops to 80% when coughing. Known RIGHT LOWER LOBE lung mass with a prior nondiagnostic endobronchial biopsy. EXAM: CT CHEST WITHOUT CONTRAST TECHNIQUE: Multidetector CT imaging of the chest was performed following the standard protocol without IV contrast. COMPARISON:  CTA chest 06/30/2020. FINDINGS: Respiratory motion blurs images throughout the examination. Cardiovascular: LEFT subclavian dual lead transvenous pacemaker with the lead tips at the RIGHT atrial appendage and RV apex. RIGHT coronary artery stent. Severe three-vessel coronary atherosclerosis. Stable moderate cardiomegaly. Moderate to severe atherosclerosis involving the thoracic and upper abdominal aorta without evidence of aneurysm. Atherosclerosis involving the proximal great vessels. Mediastinum/Nodes: No pathologically enlarged mediastinal, hilar or axillary lymph nodes. No mediastinal masses. Normal-appearing esophagus. Lungs/Pleura: Mass with associated architectural distortion/spiculation involving the medial RIGHT LOWER LOBE measuring approximately 2.3 x 2.2 cm (3/83), unchanged from the 06/30/2020 CT but slightly increased in size since the 04/02/2020 CT. Minimal focal scarring in the RIGHT LOWER LOBE posteriorly, unchanged apart from the fact that the calcifications associated with the scar are less conspicuous. 4 mm subpleural nodule in the anterolateral RIGHT UPPER LOBE (3/58), unchanged dating back to December, 2016,  confirming benignity. Peripheral nodular scarring in the posterolateral LEFT LOWER LOBE (3/98), unchanged dating back to December, 2016. No new or enlarging nodules elsewhere in either lung. No confluent or ground-glass airspace consolidation. No evidence of interstitial lung disease. Small blebs in the lung apices and emphysematous changes throughout both lungs. No pleural effusions blebs or air cyst in the inferior LEFT UPPER LOBE. Central airways patent with moderate to severe bronchial wall thickening. Upper Abdomen: Unremarkable for the unenhanced technique. Prior cholecystectomy. Musculoskeletal: Diffuse thoracic spondylosis.  Mild compression fracture of the upper endplate of T6 which does not appear acute. Degenerative disc disease and spondylosis at C6-7. Osseous demineralization. No acute findings. IMPRESSION: 1. Approximate 2.3 cm suspicious mass involving the RIGHT LOWER LOBE medially, unchanged since the 06/30/2020 CT but increased in size since the 04/02/2020 CT. 2. No new/acute cardiopulmonary disease. 3. Stable moderate cardiomegaly. Three-vessel coronary atherosclerosis. Aortic Atherosclerosis (ICD10-I70.0) and Emphysema (ICD10-J43.9). Electronically Signed   By: Evangeline Dakin M.D.   On: 09/22/2020 16:42   DG Chest Portable 1 View  Result Date: 09/22/2020 CLINICAL DATA:  Shortness of breath and cough. EXAM: PORTABLE CHEST 1 VIEW COMPARISON:  09/10/2020. FINDINGS: Patient's LEFT-sided transvenous pacemaker with leads to the RIGHT atrium and RIGHT ventricle. Heart size is normal. The lungs are free of focal consolidations and pleural effusions. Chronic RIGHT-sided rib fractures. IMPRESSION: No evidence for acute cardiopulmonary abnormality. Electronically Signed   By: Nolon Nations M.D.   On: 09/22/2020 14:52     Assessment/Plan Principal Problem:   Acute respiratory failure (HCC) Active Problems:   CAD (coronary artery disease)   Diabetes mellitus without complication (HCC)   COPD,  severe (HCC)   Tobacco abuse   Chronic systolic heart failure (HCC)   Influenza A   Depression   Right lower lobe lung mass      Acute respiratory failure Secondary to acute COPD exacerbation as well as influenza A infection Patient had room air pulse oximetry at rest of 86% and with exertion of 80%. This improved following oxygen supplementation at 6 L with pulse oximetry of about 92% Titrate oxygen to maintain pulse oximetry greater than 92% We will attempt to wean patient off oxygen once his acute illness has improved     Influenza A Place patient on contact and droplet precautions Start patient on Tamiflu 75 mg p.o. twice daily    COPD with acute exacerbation Continue as needed and scheduled bronchodilator therapy Place patient on inhaled and systemic steroids Supportive care with antitussives    Chronic systolic heart failure Not acutely exacerbated Last known LVEF of 40 to 45% Continue Entresto, spironolactone, metoprolol and torsemide     Nicotine dependence Smoking cessation has been discussed with patient in detail We will place patient on a nicotine transdermal patch 7 mg daily     Depression Continue Wellbutrin    History of coronary artery disease Continue Plavix, aspirin, Lipitor and beta-blocker    Right lower lobe lung mass Patient noted to have an approximate 2.3 cm suspicious mass involving the RIGHT LOWER LOBE medially, unchanged since the 06/30/2020 CT but increased in size since the 04/02/2020 CT. We will request pulmonology consult    Diabetes mellitus Hold oral hypoglycemic agent Glycemic control with sliding scale insulin  Maintain consistent carbohydrate diet   DVT prophylaxis: Lovenox Code Status: full code Family Communication: Greater than 50% of time was spent discussing patient's condition and plan at the bedside.  All questions and concerns have been addressed.  He verbalizes understanding and agrees with the  plan. Disposition Plan: Back to previous home environment Consults called: Pulmonology Status: At the time of admission, it appears that the appropriate admission status for this patient is inpatient. This is judged to be reasonable and necessary to provide the required intensity of service to ensure the patient's safety given the presenting symptoms, physical exam findings and initial radiographic and laboratory data in the context of their comorbid conditions. Patient requires inpatient status due to high intensity of service, high risk for further  deterioration and high frequency of surveillance required.    Collier Bullock MD [facility]     09/22/2020, 5:09 PM

## 2020-09-23 ENCOUNTER — Inpatient Hospital Stay
Admit: 2020-09-23 | Discharge: 2020-09-23 | Disposition: A | Discharge status: Home / Self Care | Payer: Medicare Other | Attending: Internal Medicine | Admitting: Internal Medicine

## 2020-09-23 DIAGNOSIS — J449 Chronic obstructive pulmonary disease, unspecified: Secondary | ICD-10-CM | POA: Diagnosis not present

## 2020-09-23 DIAGNOSIS — J9600 Acute respiratory failure, unspecified whether with hypoxia or hypercapnia: Secondary | ICD-10-CM | POA: Diagnosis not present

## 2020-09-23 LAB — CBC
HCT: 41.3 % (ref 39.0–52.0)
Hemoglobin: 13.2 g/dL (ref 13.0–17.0)
MCH: 29.7 pg (ref 26.0–34.0)
MCHC: 32 g/dL (ref 30.0–36.0)
MCV: 92.8 fL (ref 80.0–100.0)
Platelets: 164 10*3/uL (ref 150–400)
RBC: 4.45 MIL/uL (ref 4.22–5.81)
RDW: 18.3 % — ABNORMAL HIGH (ref 11.5–15.5)
WBC: 4.2 10*3/uL (ref 4.0–10.5)
nRBC: 0 % (ref 0.0–0.2)

## 2020-09-23 LAB — BASIC METABOLIC PANEL
Anion gap: 12 (ref 5–15)
BUN: 26 mg/dL — ABNORMAL HIGH (ref 8–23)
CO2: 29 mmol/L (ref 22–32)
Calcium: 8.6 mg/dL — ABNORMAL LOW (ref 8.9–10.3)
Chloride: 97 mmol/L — ABNORMAL LOW (ref 98–111)
Creatinine, Ser: 0.68 mg/dL (ref 0.61–1.24)
GFR, Estimated: >60 mL/min (ref >60)
Glucose, Bld: 243 mg/dL — ABNORMAL HIGH (ref 70–99)
Potassium: 4.2 mmol/L (ref 3.5–5.1)
Sodium: 138 mmol/L (ref 135–145)

## 2020-09-23 LAB — GLUCOSE, CAPILLARY
Glucose-Capillary: 227 mg/dL — ABNORMAL HIGH (ref 70–99)
Glucose-Capillary: 291 mg/dL — ABNORMAL HIGH (ref 70–99)
Glucose-Capillary: 166 mg/dL — ABNORMAL HIGH (ref 70–99)
Glucose-Capillary: 244 mg/dL — ABNORMAL HIGH (ref 70–99)

## 2020-09-23 LAB — HEMOGLOBIN A1C
Hgb A1c MFr Bld: 9.6 % — ABNORMAL HIGH (ref 4.8–5.6)
Mean Plasma Glucose: 228.82 mg/dL

## 2020-09-23 LAB — HIV ANTIBODY (ROUTINE TESTING W REFLEX): HIV Screen 4th Generation wRfx: NONREACTIVE

## 2020-09-23 MED ORDER — SODIUM CHLORIDE 0.9 % IV BOLUS
250.0000 mL | Freq: Once | INTRAVENOUS | Status: AC
  Administered 2020-09-23: 250 mL via INTRAVENOUS

## 2020-09-23 NOTE — Progress Notes (Signed)
MD notified of low BP-82/50. 229mL NS bolus ordered. BP to be rechecked after bolus completed.

## 2020-09-23 NOTE — Progress Notes (Signed)
*  PRELIMINARY RESULTS* Echocardiogram 2D Echocardiogram has been performed.  John Frank John Frank 09/23/2020, 1:09 PM

## 2020-09-23 NOTE — Progress Notes (Signed)
Patients coccyx area is red with dry skin around right and left area.  Nurse place barrier cream and mepilex over area on buttocks.  Nurse observed scabs on right and left hands and knuckles.  Brunetta Genera RN assessed patients skin.

## 2020-09-23 NOTE — Progress Notes (Signed)
Md informed of no change in BP post bolus, MD said to monitor for symptoms, but as he was currently asymptomatic, will hold off on any other interventions.

## 2020-09-23 NOTE — Evaluation (Signed)
Physical Therapy Evaluation Patient Details Name: John Frank MRN: [id number] DOB: 10-27-1955 Today's Date: 09/23/2020   History of Present Illness  Patient is a 65 year old male who presents to ER via EMS for generalized weakness, headache, and cough with worsening SOB. Per EMS patient had Sp02 of 86% on room air that dropped to 80% with coughing. Was placed on 6L of 02. PMH includes CHF, cardiomyopathy s/p AICD insertion, CAD, DM, GERD, COPD, liver cirrhosis. Patient is positive for influenza A.    Clinical Impression  Patient is a pleasant 65 year old male who presents with generalized weakness and limited capacity for mobility. Prior to hospital admission, pt was independent with a RW, however reports he does want more help and lives alone in an apartment.  Patient is in bed upon PT arrival and agreeable to participate with therapy. Sp02 monitored and on 2L of 02 via nasal cannula patient was >92%, Upon sitting EOB with mod I patient Sp02 increased to 95%. Nursing present in room for medication management and agreed to attempt evaluation without oxygen to assess need. Oxygen removed and Sp02 remained >95%; increasing to 97% with initial ambulation before dropping to 94%. Patient negotiated obstacles in room, including side stepping and sit to stand negotiations without LOB. Upon returning to supine patient Sp02 dropped to 88% requiring 2L of oxygen via nasal cannula to be placed, Sp02 returned to >92% within two minutes. The patient is limited with his mobility capacity and will need support at home to reduce his fall risk and assist with ADL performance.   Pt would benefit from skilled PT to address noted impairments and functional limitations (see below for any additional details).  Upon hospital discharge, pt would benefit from home health aide and home health PT to increase capacity for mobility, decrease fall risk, and improved quality of life.     Follow Up Recommendations Home health  PT;Supervision for mobility/OOB    Equipment Recommendations  None recommended by PT    Recommendations for Other Services       Precautions / Restrictions Precautions Precautions: Fall Restrictions Weight Bearing Restrictions: No      Mobility  Bed Mobility Overal bed mobility: Modified Independent             General bed mobility comments: Patient is able to transition supine to sit and sit to supine without assistance, however does use UE's to stabilize.    Transfers Overall transfer level: Modified independent Equipment used: Rolling walker (2 wheeled)             General transfer comment: Patient transfers STS with CGA from hospital bed without assistance. monitored for Sp02  Ambulation/Gait Ambulation/Gait assistance: Min guard Gait Distance (Feet): 42 Feet Assistive device: Rolling walker (2 wheeled) Gait Pattern/deviations: Step-through pattern;Shuffle;Trunk flexed;Narrow base of support Gait velocity: decreased   General Gait Details: Patient ambulates slowly negotiating obstacles in room. Sp02 monitored and raised to 97% before decreasing to 94% with prolonged ambulation on room air.  Stairs            Wheelchair Mobility    Modified Rankin (Stroke Patients Only)       Balance Overall balance assessment: Needs assistance Sitting-balance support: No upper extremity supported Sitting balance-Leahy Scale: Good Sitting balance - Comments: able to static sit with SP02>90% , slight instability with cough.   Standing balance support: Bilateral upper extremity supported;During functional activity Standing balance-Leahy Scale: Fair Standing balance comment: Patient requires use of RW for ambulation.  Pertinent Vitals/Pain Pain Assessment: No/denies pain    Home Living Family/patient expects to be discharged to:: Private residence Living Arrangements: Alone Available Help at Discharge:  Friend(s) Type of Home: Apartment Home Access: Level entry     Home Layout: One level Home Equipment: Walker - 2 wheels Additional Comments: Patient reports he has a walker he uses at home. Does not use oxygen at baseline.    Prior Function Level of Independence: Independent with assistive device(s)         Comments: Patient reports he lives alone without help but would like assistance because it is hard to do his ADLs and takes more time than it used to.     Hand Dominance   Dominant Hand: Right    Extremity/Trunk Assessment   Upper Extremity Assessment Upper Extremity Assessment: Defer to OT evaluation    Lower Extremity Assessment Lower Extremity Assessment: Generalized weakness;RLE deficits/detail;LLE deficits/detail RLE Deficits / Details: grossly 4/5  with hip extension 3+/5 RLE Sensation: decreased light touch RLE Coordination: WNL LLE Deficits / Details: grossly 4-/5 with hip extension 3+/5 LLE Sensation: decreased light touch LLE Coordination: WNL    Cervical / Trunk Assessment Cervical / Trunk Assessment: Normal  Communication   Communication: No difficulties  Cognition Arousal/Alertness: Awake/alert Behavior During Therapy: WFL for tasks assessed/performed Overall Cognitive Status: Within Functional Limits for tasks assessed                                 General Comments: Patient is A and Ox4. Is eager to participate with PT, requesting information on when he gets to go home.      General Comments General comments (skin integrity, edema, etc.): Patient has bandage on sacrum. Poor muscle mass throughout entire body.    Exercises Other Exercises Other Exercises: Patient educated on role of PT in acute care setting, safe mobility and transfers, purse lipped breathing for optimal Sp02 levels and safe negotiation of room.   Assessment/Plan    PT Assessment Patient needs continued PT services  PT Problem List Decreased  strength;Decreased activity tolerance;Decreased balance;Decreased mobility;Cardiopulmonary status limiting activity       PT Treatment Interventions DME instruction;Gait training;Stair training;Functional mobility training;Therapeutic activities;Patient/family education;Neuromuscular re-education;Balance training;Therapeutic exercise;Manual techniques    PT Goals (Current goals can be found in the Care Plan section)  Acute Rehab PT Goals Patient Stated Goal: to return home PT Goal Formulation: With patient Time For Goal Achievement: 10/07/20 Potential to Achieve Goals: Fair    Frequency Min 2X/week   Barriers to discharge Other (comment) Patient will benefit from an aide and HHPT    Co-evaluation               AM-PAC PT "6 Clicks" Mobility  Outcome Measure Help needed turning from your back to your side while in a flat bed without using bedrails?: None Help needed moving from lying on your back to sitting on the side of a flat bed without using bedrails?: A Little Help needed moving to and from a bed to a chair (including a wheelchair)?: A Little Help needed standing up from a chair using your arms (e.g., wheelchair or bedside chair)?: A Little Help needed to walk in hospital room?: A Little Help needed climbing 3-5 steps with a railing? : A Little 6 Click Score: 19    End of Session Equipment Utilized During Treatment: Gait belt;Oxygen (2L via nasal cannula once laying down) Activity Tolerance: Patient  tolerated treatment well;Patient limited by fatigue Patient left: in bed;with call bell/phone within reach;with bed alarm set;with nursing/sitter in room Nurse Communication: Mobility status PT Visit Diagnosis: Unsteadiness on feet (R26.81);Other abnormalities of gait and mobility (R26.89);Muscle weakness (generalized) (M62.81);Difficulty in walking, not elsewhere classified (R26.2)    Time: 1448-1856 PT Time Calculation (min) (ACUTE ONLY): 19 min   Charges:   PT  Evaluation $PT Eval Moderate Complexity: 1 Mod        Janna Arch, PT, DPT   09/23/2020, 10:22 AM

## 2020-09-23 NOTE — Consult Note (Signed)
Pulmonary Medicine          Date: 09/23/2020,   MRN# [id number] John Frank 1956/02/22     AdmissionWeight: 61.2 kg                 CurrentWeight: 61.2 kg   Referring physician: Dr Francine Graven   CHIEF COMPLAINT:   Acute hypoxemic respiratory failure   HISTORY OF PRESENT ILLNESS   John Frank is a 65 y.o. male with medical history significant for chronic systolic heart failure, cardiomyopathy status post AICD insertion, coronary artery disease, diabetes mellitus, GERD, COPD and liver cirrhosis who presents to the ER via EMS for evaluation of generalized weakness, headache, cough which is productive of clear phlegm and worsening shortness of breath. He has signficant hx of RLL lung cancer s/p radiation therapy x 5 with plan for surveillance CT chest and overall excellent progress with therapy.  He unfortanely did get influenza  A infection with subsequent acute hypoxemic respiratory failure.   I reviewed with him his CT chest and we discussed findings including labs and imaging.  I met with attending physician and we reviewed care plan together with patient at bedside.  We discussed short term medical plan over next 12-24 hours.  Patient is pleased and all questions answered.    PAST MEDICAL HISTORY   Past Medical History:  Diagnosis Date  . AICD (automatic cardioverter/defibrillator) present   . Angina at rest [facility])   . Aortic atherosclerosis ([place])   . CAD (coronary artery disease)    s/p stents in 2017  . Chronic systolic CHF (congestive heart failure) ([place])   . Cirrhosis of liver ([place])   . COPD (chronic obstructive pulmonary disease) ([place])   . Diabetes ([place])   . GERD (gastroesophageal reflux disease)   . Heart attack ([place])   . Hyperlipidemia   . Hypertension   . Ischemic cardiomyopathy   . Paroxysmal ventricular tachycardia ([place])   . Right lower lobe lung mass   . Shortness of breath dyspnea   . Sleep apnea      SURGICAL HISTORY   Past Surgical  History:  Procedure Laterality Date  . CARDIAC PACEMAKER PLACEMENT    . cardiac stents    . CARDIAC SURGERY     defibrillator and pacemaker  . CORONARY ANGIOPLASTY    . ESOPHAGOGASTRODUODENOSCOPY N/A 09/19/2014   Procedure: ESOPHAGOGASTRODUODENOSCOPY (EGD);  Surgeon: Lucilla Lame, MD;  Location: [facility] ENDOSCOPY;  Service: Endoscopy;  Laterality: N/A;  . ESOPHAGOGASTRODUODENOSCOPY (EGD) WITH PROPOFOL N/A 09/28/2017   Procedure: ESOPHAGOGASTRODUODENOSCOPY (EGD) WITH PROPOFOL;  Surgeon: Virgel Manifold, MD;  Location: [facility] ENDOSCOPY;  Service: Endoscopy;  Laterality: N/A;  . ESOPHAGOGASTRODUODENOSCOPY (EGD) WITH PROPOFOL N/A 04/19/2018   Procedure: ESOPHAGOGASTRODUODENOSCOPY (EGD) WITH PROPOFOL with Gastric Mapping;  Surgeon: [name], MD;  Location: [facility] ENDOSCOPY;  Service: Gastroenterology;  Laterality: N/A;  . ESOPHAGOGASTRODUODENOSCOPY (EGD) WITH PROPOFOL N/A 12/13/2018   Procedure: ESOPHAGOGASTRODUODENOSCOPY (EGD) WITH PROPOFOL;  Surgeon: [name], MD;  Location: [facility] ENDOSCOPY;  Service: Gastroenterology;  Laterality: N/A;  . ESOPHAGOGASTRODUODENOSCOPY (EGD) WITH PROPOFOL N/A 03/31/2019   Procedure: ESOPHAGOGASTRODUODENOSCOPY (EGD) WITH PROPOFOL;  Surgeon: Lin Landsman, MD;  Location: [facility] ENDOSCOPY;  Service: Gastroenterology;  Laterality: N/A;  . IMPLANTABLE CARDIOVERTER DEFIBRILLATOR (ICD) GENERATOR CHANGE Left 02/21/2020   Procedure: ICD GENERATOR CHANGE;  Surgeon: Isaias Cowman, MD;  Location: [facility] ORS;  Service: Cardiovascular;  Laterality: Left;  Marland Kitchen VIDEO BRONCHOSCOPY WITH ENDOBRONCHIAL NAVIGATION N/A 06/22/2020   Procedure: VIDEO BRONCHOSCOPY WITH ENDOBRONCHIAL NAVIGATION;  Surgeon:  Ottie Glazier, MD;  Location: [facility] ORS;  Service: Thoracic;  Laterality: N/A;  . VIDEO BRONCHOSCOPY WITH ENDOBRONCHIAL ULTRASOUND N/A 06/22/2020   Procedure: VIDEO BRONCHOSCOPY WITH ENDOBRONCHIAL ULTRASOUND;  Surgeon: Ottie Glazier, MD;  Location: [facility] ORS;  Service: Thoracic;   Laterality: N/A;     FAMILY HISTORY   Family History  Problem Relation Age of Onset  . Diabetes Mother   . Heart attack Father 89     SOCIAL HISTORY   Social History   Tobacco Use  . Smoking status: Current Every Day Smoker    Packs/day: 0.25    Years: 40.00    Pack years: 10.00    Types: Cigarettes  . Smokeless tobacco: Never Used  Vaping Use  . Vaping Use: Never used  Substance Use Topics  . Alcohol use: No  . Drug use: Never     MEDICATIONS    Home Medication:    Current Medication:  Current Facility-Administered Medications:  .  0.9 %  sodium chloride infusion, 250 mL, Intravenous, PRN, Agbata, Tochukwu, MD, Last Rate: 10 mL/hr at 09/22/20 1827, 250 mL at 09/22/20 1827 .  aspirin EC tablet 81 mg, 81 mg, Oral, Daily, Agbata, Tochukwu, MD, 81 mg at 09/23/20 0940 .  atorvastatin (LIPITOR) tablet 40 mg, 40 mg, Oral, Daily, Agbata, Tochukwu, MD, 40 mg at 09/23/20 0940 .  buPROPion [facility]) tablet 75 mg, 75 mg, Oral, Daily, Agbata, Tochukwu, MD, 75 mg at 09/23/20 0938 .  clopidogrel (PLAVIX) tablet 75 mg, 75 mg, Oral, Daily, Agbata, Tochukwu, MD, 75 mg at 09/23/20 0940 .  enoxaparin (LOVENOX) injection 40 mg, 40 mg, Subcutaneous, Q24H, Agbata, Tochukwu, MD, 40 mg at 09/22/20 2113 .  insulin aspart (novoLOG) injection 0-15 Units, 0-15 Units, Subcutaneous, TID WC, Agbata, Tochukwu, MD, 5 Units at 09/23/20 0936 .  insulin aspart (novoLOG) injection 0-5 Units, 0-5 Units, Subcutaneous, QHS, [place], Bing Neighbors, NP, 2 Units at 09/22/20 2345 .  ipratropium-albuterol (DUONEB) 0.5-2.5 (3) MG/3ML nebulizer solution 3 mL, 3 mL, Nebulization, Once, Lavonia Drafts, MD .  ipratropium-albuterol (DUONEB) 0.5-2.5 (3) MG/3ML nebulizer solution 3 mL, 3 mL, Nebulization, Once, Lavonia Drafts, MD .  ipratropium-albuterol (DUONEB) 0.5-2.5 (3) MG/3ML nebulizer solution 3 mL, 3 mL, Nebulization, TID, Agbata, Tochukwu, MD, 3 mL at 09/23/20 0708 .  loratadine (CLARITIN) tablet 10 mg,  10 mg, Oral, Daily, Agbata, Tochukwu, MD, 10 mg at 09/23/20 0940 .  methylPREDNISolone sodium succinate (SOLU-MEDROL) 40 mg/mL injection 40 mg, 40 mg, Intravenous, Q12H, 40 mg at 09/22/20 2344 **FOLLOWED BY** [START ON 09/24/2020] predniSONE (DELTASONE) tablet 40 mg, 40 mg, Oral, Q breakfast, Agbata, Tochukwu, MD .  metoprolol succinate (TOPROL-XL) 24 hr tablet 100 mg, 100 mg, Oral, Daily, Agbata, Tochukwu, MD, 100 mg at 09/23/20 0940 .  mometasone-formoterol (DULERA) 200-5 MCG/ACT inhaler 2 puff, 2 puff, Inhalation, BID, Agbata, Tochukwu, MD, 2 puff at 09/23/20 0941 .  nicotine (NICODERM CQ - dosed in mg/24 hr) patch 7 mg, 7 mg, Transdermal, Daily, Agbata, Tochukwu, MD, 7 mg at 09/23/20 0939 .  oseltamivir (TAMIFLU) capsule 75 mg, 75 mg, Oral, BID, Agbata, Tochukwu, MD, 75 mg at 09/23/20 0940 .  pantoprazole (PROTONIX) EC tablet 40 mg, 40 mg, Oral, BID AC, Agbata, Tochukwu, MD, 40 mg at 09/23/20 0940 .  sacubitril-valsartan (ENTRESTO) 49-51 mg per tablet, 1 tablet, Oral, BID, Agbata, Tochukwu, MD, 1 tablet at 09/23/20 0940 .  sodium chloride flush (NS) 0.9 % injection 3 mL, 3 mL, Intravenous, Q12H, Agbata, Tochukwu, MD, 3 mL at 09/23/20 0941 .  sodium  chloride flush (NS) 0.9 % injection 3 mL, 3 mL, Intravenous, PRN, Agbata, Tochukwu, MD .  spironolactone (ALDACTONE) tablet 12.5 mg, 12.5 mg, Oral, Daily, Agbata, Tochukwu, MD, 12.5 mg at 09/23/20 0941 .  sucralfate (CARAFATE) tablet 1 g, 1 g, Oral, QID, Agbata, Tochukwu, MD, 1 g at 09/23/20 0941 .  torsemide (DEMADEX) tablet 20 mg, 20 mg, Oral, Daily, Agbata, Tochukwu, MD, 20 mg at 09/23/20 0940 .  traMADol (ULTRAM) tablet 50 mg, 50 mg, Oral, Q12H PRN, Agbata, Tochukwu, MD, 50 mg at 09/22/20 2003    ALLERGIES   Patient has no known allergies.     REVIEW OF SYSTEMS    Review of Systems:  Gen:  Denies  fever, sweats, chills weigh loss  HEENT: Denies blurred vision, double vision, ear pain, eye pain, hearing loss, nose bleeds, sore  throat Cardiac:  No dizziness, chest pain or heaviness, chest tightness,edema Resp:   Denies cough or sputum porduction, shortness of breath,wheezing, hemoptysis,  Gi: Denies swallowing difficulty, stomach pain, nausea or vomiting, diarrhea, constipation, bowel incontinence Gu:  Denies bladder incontinence, burning urine Ext:   Denies Joint pain, stiffness or swelling Skin: Denies  skin rash, easy bruising or bleeding or hives Endoc:  Denies polyuria, polydipsia , polyphagia or weight change Psych:   Denies depression, insomnia or hallucinations   Other:  All other systems negative   VS: BP 100/60 (BP Location: Left Arm)   Pulse 60   Temp 97.7 F (36.5 C)   Resp 18   Ht _0  (1.626 m)   Wt 61.2 kg   SpO2 97%   BMI 23.17 kg/m      PHYSICAL EXAM    GENERAL:NAD, no fevers, chills, no weakness no fatigue HEAD: Normocephalic, atraumatic.  EYES: Pupils equal, round, reactive to light. Extraocular muscles intact. No scleral icterus.  MOUTH: Moist mucosal membrane. Dentition intact. No abscess noted.  EAR, NOSE, THROAT: Clear without exudates. No external lesions.  NECK: Supple. No thyromegaly. No nodules. No JVD.  PULMONARY: Diffuse coarse rhonchi right sided +wheezes CARDIOVASCULAR: S1 and S2. Regular rate and rhythm. No murmurs, rubs, or gallops. No edema. Pedal pulses 2+ bilaterally.  GASTROINTESTINAL: Soft, nontender, nondistended. No masses. Positive bowel sounds. No hepatosplenomegaly.  MUSCULOSKELETAL: No swelling, clubbing, or edema. Range of motion full in all extremities.  NEUROLOGIC: Cranial nerves II through XII are intact. No gross focal neurological deficits. Sensation intact. Reflexes intact.  SKIN: No ulceration, lesions, rashes, or cyanosis. Skin warm and dry. Turgor intact.  PSYCHIATRIC: Mood, affect within normal limits. The patient is awake, alert and oriented x 3. Insight, judgment intact.       IMAGING    DG Chest 2 View  Result Date:  09/10/2020 CLINICAL DATA:  Shortness of breath and cough. EXAM: CHEST - 2 VIEW COMPARISON:  August 27, 2020 FINDINGS: The lungs are hyperinflated. A dual lead AICD is noted. Chronic appearing increased lung markings are seen without evidence of acute infiltrate, pleural effusion or pneumothorax. The heart size and mediastinal contours are within normal limits. Chronic fourth and fifth right rib fractures are seen. Degenerative changes are seen throughout the thoracic spine. IMPRESSION: No active cardiopulmonary disease. Electronically Signed   By: Virgina Norfolk M.D.   On: 09/10/2020 19:18   DG Chest 2 View  Result Date: 08/27/2020 CLINICAL DATA:  Shortness of breath and weakness for several days. EXAM: CHEST - 2 VIEW COMPARISON:  08/01/2020 FINDINGS: The heart size and mediastinal contours are within normal limits. Dual lead transvenous pacemaker  remains in appropriate position. Increased central peribronchial thickening is noted. New linear opacity in the left perihilar region is consistent with mild subsegmental atelectasis. No evidence of pulmonary consolidation or pleural effusion. Several old right rib fracture deformities are again noted. IMPRESSION: New central peribronchial thickening and mild left perihilar subsegmental atelectasis. No evidence of pulmonary consolidation or pleural effusion. Electronically Signed   By: Marlaine Hind M.D.   On: 08/27/2020 11:51   CT CHEST WO CONTRAST  Result Date: 09/22/2020 CLINICAL DATA:  65 year old presenting with cough, headache, generalized weakness and hypoxia (oxygen saturation 86% on room air which drops to 80% when coughing. Known RIGHT LOWER LOBE lung mass with a prior nondiagnostic endobronchial biopsy. EXAM: CT CHEST WITHOUT CONTRAST TECHNIQUE: Multidetector CT imaging of the chest was performed following the standard protocol without IV contrast. COMPARISON:  CTA chest 06/30/2020. FINDINGS: Respiratory motion blurs images throughout the examination.  Cardiovascular: LEFT subclavian dual lead transvenous pacemaker with the lead tips at the RIGHT atrial appendage and RV apex. RIGHT coronary artery stent. Severe three-vessel coronary atherosclerosis. Stable moderate cardiomegaly. Moderate to severe atherosclerosis involving the thoracic and upper abdominal aorta without evidence of aneurysm. Atherosclerosis involving the proximal great vessels. Mediastinum/Nodes: No pathologically enlarged mediastinal, hilar or axillary lymph nodes. No mediastinal masses. Normal-appearing esophagus. Lungs/Pleura: Mass with associated architectural distortion/spiculation involving the medial RIGHT LOWER LOBE measuring approximately 2.3 x 2.2 cm (3/83), unchanged from the 06/30/2020 CT but slightly increased in size since the 04/02/2020 CT. Minimal focal scarring in the RIGHT LOWER LOBE posteriorly, unchanged apart from the fact that the calcifications associated with the scar are less conspicuous. 4 mm subpleural nodule in the anterolateral RIGHT UPPER LOBE (3/58), unchanged dating back to December, 2016, confirming benignity. Peripheral nodular scarring in the posterolateral LEFT LOWER LOBE (3/98), unchanged dating back to December, 2016. No new or enlarging nodules elsewhere in either lung. No confluent or ground-glass airspace consolidation. No evidence of interstitial lung disease. Small blebs in the lung apices and emphysematous changes throughout both lungs. No pleural effusions blebs or air cyst in the inferior LEFT UPPER LOBE. Central airways patent with moderate to severe bronchial wall thickening. Upper Abdomen: Unremarkable for the unenhanced technique. Prior cholecystectomy. Musculoskeletal: Diffuse thoracic spondylosis. Mild compression fracture of the upper endplate of T6 which does not appear acute. Degenerative disc disease and spondylosis at C6-7. Osseous demineralization. No acute findings. IMPRESSION: 1. Approximate 2.3 cm suspicious mass involving the RIGHT LOWER  LOBE medially, unchanged since the 06/30/2020 CT but increased in size since the 04/02/2020 CT. 2. No new/acute cardiopulmonary disease. 3. Stable moderate cardiomegaly. Three-vessel coronary atherosclerosis. Aortic Atherosclerosis (ICD10-I70.0) and Emphysema (ICD10-J43.9). Electronically Signed   By: Evangeline Dakin M.D.   On: 09/22/2020 16:42   DG Chest Portable 1 View  Result Date: 09/22/2020 CLINICAL DATA:  Shortness of breath and cough. EXAM: PORTABLE CHEST 1 VIEW COMPARISON:  09/10/2020. FINDINGS: Patient's LEFT-sided transvenous pacemaker with leads to the RIGHT atrium and RIGHT ventricle. Heart size is normal. The lungs are free of focal consolidations and pleural effusions. Chronic RIGHT-sided rib fractures. IMPRESSION: No evidence for acute cardiopulmonary abnormality. Electronically Signed   By: Nolon Nations M.D.   On: 09/22/2020 14:52      ASSESSMENT/PLAN   Acute hypoxemic respiratory failure  - due to influenza A infection    - tamiflu   - medrol dose pack   -O2 concentrator 3-62mths   Right lower lobe lung cancer   - mildly edematous focus post SBRT with interval increase  in size - suspect some of this is context of bronchitic changes from influenza and overlying inflammatory changes post SBRT   - continue with current therapy for flu and repeat imaging on outpatient per rad/onc and med/onc plan       Thank you for allowing me to participate in the care of this patient.  Total face to face encounter time for this patient visit was 40 min. >50% of the time was  spent in counseling and coordination of care.   Patient/Family are satisfied with care plan and all questions have been answered.  This document was prepared using Dragon voice recognition software and may include unintentional dictation errors.     Ottie Glazier, M.D.  Division of [facility]

## 2020-09-23 NOTE — Progress Notes (Signed)
PROGRESS NOTE    LASTER APPLING  RDE:[id number] DOB: 12/06/1955 DOA: 09/22/2020 PCP: Idelle Crouch, MD  140A/140A-AA   Assessment & Plan:   Principal Problem:   Acute respiratory failure ([place]) Active Problems:   CAD (coronary artery disease)   Diabetes mellitus without complication ([place])   COPD, severe ([place])   Tobacco abuse   Chronic systolic heart failure ([place])   Influenza A   Depression   Right lower lobe lung mass   John Frank is a 65 y.o. male with medical history significant for chronic systolic heart failure, cardiomyopathy status post AICD insertion, coronary artery disease, diabetes mellitus, GERD, COPD and liver cirrhosis who presents to the ER via EMS for evaluation of generalized weakness, headache, cough which is productive of clear phlegm and worsening shortness of breath. Per EMS patient had room air pulse oximetry of 86% which dropped to 80% when he coughs.  Patient was placed on 6 L of oxygen with improvement in his pulse oximetry to 92% and was transported to the ER.   Acute hypoxic respiratory failure Secondary to acute COPD exacerbation as well as influenza A infection Patient had room air pulse oximetry at rest of 86% and with exertion of 80%. This improved following oxygen supplementation at 6 L with pulse oximetry of about 92% Plan: --treat flu and COPD exacerbation --Continue supplemental O2 to keep sats between 88-92%, wean as tolerated --ambulation test prior to discharge  Influenza A --cont Tamiflu  COPD with acute exacerbation --started on solumedrol and DuoNeb on admission Plan: --cont steroid with prednisone starting tomorrow --DuoNeb scheduled --cont Dulera  RLL lung cancer s/p radiation therapy  --follows with pulm Dr. Lanney Gins as outpatient --Per Dr. Lanney Gins, "mildly edematous focus post SBRT with interval increase in size - suspect some of this is context of bronchitic changes from influenza and overlying inflammatory  changes post SBRT" --Pulm consult, Dr. Lanney Gins seeing   - continue with current therapy for flu and repeat imaging on outpatient per rad/onc and med/onc plan  Chronic systolic heart failure Not acutely exacerbated Last known LVEF of 40 to 45% --cont metoprolol and spironolactone --Hold Entresto and torsemide due to low BP  Current smoker Smoking cessation has been discussed with patient in detail --Nicotine patch  Depression Continue Wellbutrin  History of coronary artery disease Continue Plavix, aspirin, Lipitor and beta-blocker  Diabetes mellitus Hold oral hypoglycemic agent --SSI   DVT prophylaxis: Lovenox SQ Code Status: Full code  Family Communication:  Level of care: Med-Surg Dispo:   The patient is from: home Anticipated d/c is to: home Anticipated d/c date is: tomorrow Patient currently is not medically ready to d/c due to: hypoxic being treated for flu and COPD   Subjective and Interval History:  Pt said he felt better and wanted to know when he can go home to take care of his dog.   Objective: Vitals:   09/23/20 1634 09/23/20 1811 09/23/20 1948 09/23/20 2039  BP: (!) 82/50 (!) 82/49  100/61  Pulse: 86 63  72  Resp: 18   18  Temp: 97.8 F (36.6 C)   99.2 F (37.3 C)  TempSrc:      SpO2: 93%  93% 90%  Weight:      Height:        Intake/Output Summary (Last 24 hours) at 09/24/2020 0007 Last data filed at 09/23/2020 2100 Gross per 24 hour  Intake 250 ml  Output 1700 ml  Net -1450 ml   [employer]  09/22/20 1349  Weight: 61.2 kg    Examination:   Constitutional: NAD, AAOx3 HEENT: conjunctivae and lids normal, EOMI CV: No cyanosis.   RESP: diffuse rhonchi and wheezes, on 2L Extremities: No effusions, edema in BLE SKIN: warm, dry, scabs over knuckles  Neuro: II - XII grossly intact.   Psych: Normal mood and affect.  Appropriate judgement and reason   Data Reviewed: I have personally reviewed following labs and imaging  studies  CBC: Recent Labs  Lab 09/22/20 1509 09/23/20 0505  WBC 5.7 4.2  NEUTROABS 4.6  --   HGB 12.7* 13.2  HCT 40.8 41.3  MCV 94.0 92.8  PLT 160 355   Basic Metabolic Panel: Recent Labs  Lab 09/18/20 0924 09/22/20 1354 09/23/20 0505  NA 133* 138 138  K 3.9 4.7 4.2  CL 99 102 97*  CO2 25 28 29   GLUCOSE 184* 172* 243*  BUN 19 25* 26*  CREATININE 0.77 0.69 0.68  CALCIUM 8.7* 8.7* 8.6*   GFR: Estimated Creatinine Clearance: 77.1 mL/min (by C-G formula based on SCr of 0.68 mg/dL). Liver Function Tests: Recent Labs  Lab 09/22/20 1354  AST 26  ALT 23  ALKPHOS 111  BILITOT 0.9  PROT 6.8  ALBUMIN 3.8   No results for input(s): LIPASE, AMYLASE in the last 168 hours. No results for input(s): AMMONIA in the last 168 hours. Coagulation Profile: No results for input(s): INR, PROTIME in the last 168 hours. Cardiac Enzymes: No results for input(s): CKTOTAL, CKMB, CKMBINDEX, TROPONINI in the last 168 hours. BNP (last 3 results) No results for input(s): PROBNP in the last 8760 hours. HbA1C: Recent Labs    09/22/20 1749  HGBA1C 9.6*   CBG: Recent Labs  Lab 09/22/20 2154 09/23/20 0738 09/23/20 1143 09/23/20 1630 09/23/20 2131  GLUCAP 226* 227* 291* 166* 244*   Lipid Profile: No results for input(s): CHOL, HDL, LDLCALC, TRIG, CHOLHDL, LDLDIRECT in the last 72 hours. Thyroid Function Tests: No results for input(s): TSH, T4TOTAL, FREET4, T3FREE, THYROIDAB in the last 72 hours. Anemia Panel: No results for input(s): VITAMINB12, FOLATE, FERRITIN, TIBC, IRON, RETICCTPCT in the last 72 hours. Sepsis Labs: Recent Labs  Lab 09/22/20 1357 09/22/20 1509  LATICACIDVEN 1.3 1.1    Recent Results (from the past 240 hour(s))  Resp Panel by RT-PCR (Flu A&B, Covid) Nasopharyngeal Swab     Status: Abnormal   Collection Time: 09/22/20  3:25 PM   Specimen: Nasopharyngeal Swab; Nasopharyngeal(NP) swabs in vial transport medium  Result Value Ref Range Status   SARS  Coronavirus 2 by RT PCR NEGATIVE NEGATIVE Final    Comment: (NOTE) SARS-CoV-2 target nucleic acids are NOT DETECTED.  The SARS-CoV-2 RNA is generally detectable in upper respiratory specimens during the acute phase of infection. The lowest concentration of SARS-CoV-2 viral copies this assay can detect is 138 copies/mL. A negative result does not preclude SARS-Cov-2 infection and should not be used as the sole basis for treatment or other patient management decisions. A negative result may occur with  improper specimen collection/handling, submission of specimen other than nasopharyngeal swab, presence of viral mutation(s) within the areas targeted by this assay, and inadequate number of viral copies(<138 copies/mL). A negative result must be combined with clinical observations, patient history, and epidemiological information. The expected result is Negative.  Fact Sheet for Patients:  EntrepreneurPulse.com.au  Fact Sheet for Healthcare Providers:  IncredibleEmployment.be  This test is no t yet approved or cleared by the [place] and  has been authorized for  detection and/or diagnosis of SARS-CoV-2 by FDA under an Emergency Use Authorization (EUA). This EUA will remain  in effect (meaning this test can be used) for the duration of the COVID-19 declaration under Section 564(b)(1) of the Act, 21 U.S.C.section 360bbb-3(b)(1), unless the authorization is terminated  or revoked sooner.       Influenza A by PCR POSITIVE (A) NEGATIVE Final   Influenza B by PCR NEGATIVE NEGATIVE Final    Comment: (NOTE) The Xpert Xpress SARS-CoV-2/FLU/RSV plus assay is intended as an aid in the diagnosis of influenza from Nasopharyngeal swab specimens and should not be used as a sole basis for treatment. Nasal washings and aspirates are unacceptable for Xpert Xpress SARS-CoV-2/FLU/RSV testing.  Fact Sheet for  Patients: EntrepreneurPulse.com.au  Fact Sheet for Healthcare Providers: IncredibleEmployment.be  This test is not yet approved or cleared by the [place] FDA and has been authorized for detection and/or diagnosis of SARS-CoV-2 by FDA under an Emergency Use Authorization (EUA). This EUA will remain in effect (meaning this test can be used) for the duration of the COVID-19 declaration under Section 564(b)(1) of the Act, 21 U.S.C. section 360bbb-3(b)(1), unless the authorization is terminated or revoked.  Performed at [facility], [address], [place], [place] [address]       Radiology Studies: CT CHEST WO CONTRAST  Result Date: 09/22/2020 CLINICAL DATA:  65 year old presenting with cough, headache, generalized weakness and hypoxia (oxygen saturation 86% on room air which drops to 80% when coughing. Known RIGHT LOWER LOBE lung mass with a prior nondiagnostic endobronchial biopsy. EXAM: CT CHEST WITHOUT CONTRAST TECHNIQUE: Multidetector CT imaging of the chest was performed following the standard protocol without IV contrast. COMPARISON:  CTA chest 06/30/2020. FINDINGS: Respiratory motion blurs images throughout the examination. Cardiovascular: LEFT subclavian dual lead transvenous pacemaker with the lead tips at the RIGHT atrial appendage and RV apex. RIGHT coronary artery stent. Severe three-vessel coronary atherosclerosis. Stable moderate cardiomegaly. Moderate to severe atherosclerosis involving the thoracic and upper abdominal aorta without evidence of aneurysm. Atherosclerosis involving the proximal great vessels. Mediastinum/Nodes: No pathologically enlarged mediastinal, hilar or axillary lymph nodes. No mediastinal masses. Normal-appearing esophagus. Lungs/Pleura: Mass with associated architectural distortion/spiculation involving the medial RIGHT LOWER LOBE measuring approximately 2.3 x 2.2 cm (3/83), unchanged from the 06/30/2020 CT  but slightly increased in size since the 04/02/2020 CT. Minimal focal scarring in the RIGHT LOWER LOBE posteriorly, unchanged apart from the fact that the calcifications associated with the scar are less conspicuous. 4 mm subpleural nodule in the anterolateral RIGHT UPPER LOBE (3/58), unchanged dating back to December, 2016, confirming benignity. Peripheral nodular scarring in the posterolateral LEFT LOWER LOBE (3/98), unchanged dating back to December, 2016. No new or enlarging nodules elsewhere in either lung. No confluent or ground-glass airspace consolidation. No evidence of interstitial lung disease. Small blebs in the lung apices and emphysematous changes throughout both lungs. No pleural effusions blebs or air cyst in the inferior LEFT UPPER LOBE. Central airways patent with moderate to severe bronchial wall thickening. Upper Abdomen: Unremarkable for the unenhanced technique. Prior cholecystectomy. Musculoskeletal: Diffuse thoracic spondylosis. Mild compression fracture of the upper endplate of T6 which does not appear acute. Degenerative disc disease and spondylosis at C6-7. Osseous demineralization. No acute findings. IMPRESSION: 1. Approximate 2.3 cm suspicious mass involving the RIGHT LOWER LOBE medially, unchanged since the 06/30/2020 CT but increased in size since the 04/02/2020 CT. 2. No new/acute cardiopulmonary disease. 3. Stable moderate cardiomegaly. Three-vessel coronary atherosclerosis. Aortic Atherosclerosis (ICD10-I70.0) and Emphysema (ICD10-J43.9).  Electronically Signed   By: Evangeline Dakin M.D.   On: 09/22/2020 16:42   DG Chest Portable 1 View  Result Date: 09/22/2020 CLINICAL DATA:  Shortness of breath and cough. EXAM: PORTABLE CHEST 1 VIEW COMPARISON:  09/10/2020. FINDINGS: Patient's LEFT-sided transvenous pacemaker with leads to the RIGHT atrium and RIGHT ventricle. Heart size is normal. The lungs are free of focal consolidations and pleural effusions. Chronic RIGHT-sided rib  fractures. IMPRESSION: No evidence for acute cardiopulmonary abnormality. Electronically Signed   By: Nolon Nations M.D.   On: 09/22/2020 14:52     Scheduled Meds: . aspirin EC  81 mg Oral Daily  . atorvastatin  40 mg Oral Daily  . buPROPion  75 mg Oral Daily  . clopidogrel  75 mg Oral Daily  . enoxaparin (LOVENOX) injection  40 mg Subcutaneous Q24H  . insulin aspart  0-15 Units Subcutaneous TID WC  . insulin aspart  0-5 Units Subcutaneous QHS  . ipratropium-albuterol  3 mL Nebulization Once  . ipratropium-albuterol  3 mL Nebulization Once  . ipratropium-albuterol  3 mL Nebulization TID  . loratadine  10 mg Oral Daily  . metoprolol succinate  100 mg Oral Daily  . mometasone-formoterol  2 puff Inhalation BID  . nicotine  7 mg Transdermal Daily  . oseltamivir  75 mg Oral BID  . pantoprazole  40 mg Oral BID AC  . predniSONE  40 mg Oral Q breakfast  . sacubitril-valsartan  1 tablet Oral BID  . sodium chloride flush  3 mL Intravenous Q12H  . spironolactone  12.5 mg Oral Daily  . sucralfate  1 g Oral QID  . torsemide  20 mg Oral Daily   Continuous Infusions: . sodium chloride 250 mL (09/22/20 1827)     LOS: 2 days     Enzo Bi, MD [facility] If 7PM-7AM, please contact night-coverage 09/24/2020, 12:07 AM

## 2020-09-23 NOTE — Evaluation (Signed)
Occupational Therapy Evaluation Patient Details Name: John Frank MRN: [id number] DOB: 1955/10/13 Today's Date: 09/23/2020    History of Present Illness Patient is a 65 year old male who presents to ER via EMS for generalized weakness, headache, and cough with worsening SOB. Per EMS patient had Sp02 of 86% on room air that dropped to 80% with coughing. Was placed on 6L of 02. PMH includes CHF, cardiomyopathy s/p AICD insertion, CAD, DM, GERD, COPD, liver cirrhosis. Patient is positive for influenza A.   Clinical Impression   Pt seen for OT evaluation this date. Prior to admission, pt was mod-independent in all ADLs and functional mobility (with RW), living in a 1-story home alone. Pt currently requires SUPERVISION for standing grooming tasks and functional mobility of short household distances (~10 ft) due to current functional impairments (See OT Problem List below). This date, pt educated in energy conservation strategies including pursed lip breathing, activity pacing, home/routines modifications, work simplification, AE/DME, prioritizing of meaningful occupations, and falls prevention. Handout provided. Pt verbalized understanding and would benefit from additional skilled OT services to maximize recall and carryover of energy conservation strategies and facilitate implementation of learned techniques into daily routines. Upon discharge, recommend [place] services.      Follow Up Recommendations  Home health OT    Equipment Recommendations  Tub/shower seat       Precautions / Restrictions Precautions Precautions: Fall Restrictions Weight Bearing Restrictions: No      Mobility Bed Mobility Overal bed mobility: Modified Independent             General bed mobility comments: With HOB elevated, patient is able to transition supine to sit and sit to supine without assistance    Transfers Overall transfer level: Needs assistance Equipment used: Rolling walker (2  wheeled) Transfers: Sit to/from Stand Sit to Stand: Supervision         General transfer comment: Supervision to monitor SpO2    Balance Overall balance assessment: Needs assistance Sitting-balance support: No upper extremity supported;Feet supported Sitting balance-Leahy Scale: Good Sitting balance - Comments: Good sitting balance at EOB   Standing balance support: No upper extremity supported;During functional activity Standing balance-Leahy Scale: Fair Standing balance comment: Able to wash face with b/l UE, no LOB observed                           ADL either performed or assessed with clinical judgement   ADL Overall ADL's : Needs assistance/impaired     Grooming: Wash/dry face;Supervision/safety;Set up;Standing                               Functional mobility during ADLs: Supervision/safety;Rolling walker (to walk ~10 ft with RW)       Vision Baseline Vision/History: Wears glasses Wears Glasses: At all times              Pertinent Vitals/Pain Pain Assessment: No/denies pain     Hand Dominance Right   Extremity/Trunk Assessment Upper Extremity Assessment Upper Extremity Assessment: Overall WFL for tasks assessed   Lower Extremity Assessment Lower Extremity Assessment: Defer to PT evaluation RLE Deficits / Details: grossly 4/5  with hip extension 3+/5 RLE Sensation: decreased light touch RLE Coordination: WNL LLE Deficits / Details: grossly 4-/5 with hip extension 3+/5 LLE Sensation: decreased light touch LLE Coordination: WNL   Cervical / Trunk Assessment Cervical / Trunk Assessment: Normal   Communication Communication  Communication: No difficulties   Cognition Arousal/Alertness: Awake/alert Behavior During Therapy: WFL for tasks assessed/performed Overall Cognitive Status: Within Functional Limits for tasks assessed                                 General Comments: Pt A&Ox4. Pleasant and agreeable  throughout   General Comments  Patient has bandage on sacrum. Poor muscle mass throughout entire body.    Exercises Other Exercises Other Exercises: Pt educated in energy conservation strategies including pursed lip breathing, activity pacing, home/routines modifications, work simplification, AE/DME, prioritizing of meaningful occupations, and falls prevention. Handout provided.        Home Living Family/patient expects to be discharged to:: Private residence Living Arrangements: Alone Available Help at Discharge: Friend(s) Type of Home: Apartment Home Access: Level entry     [place]: One level     Bathroom Shower/Tub: Teacher, early years/pre: [place]: Environmental consultant - 2 wheels;Grab bars - tub/shower;Hand held shower head   Additional Comments: Patient reports he has a walker he uses at home. Does not use oxygen at baseline.      Prior Functioning/Environment Level of Independence: Independent with assistive device(s)        Comments: Pt reports that he is MOD-I (with RW) for functional mobility and ADLs. Pt reports that he cares for a dog        OT Problem List: Decreased strength;Decreased activity tolerance;Cardiopulmonary status limiting activity      OT Treatment/Interventions: Self-care/ADL training;Therapeutic exercise;Energy conservation;DME and/or AE instruction;Therapeutic activities;Patient/family education;Balance training    OT Goals(Current goals can be found in the care plan section) Acute Rehab OT Goals Patient Stated Goal: to return home OT Goal Formulation: With patient Time For Goal Achievement: 10/07/20 Potential to Achieve Goals: Good ADL Goals Pt Will Transfer to Toilet: with modified independence;ambulating;regular height toilet Pt Will Perform Toileting - Clothing Manipulation and hygiene: with modified independence;sitting/lateral leans Additional ADL Goal #1: pt will implement at least 1 energy conservation  strategy in 2/3 opportunities  OT Frequency: Min 1X/week    AM-PAC OT "6 Clicks" Daily Activity     Outcome Measure Help from another person eating meals?: None Help from another person taking care of personal grooming?: A Little Help from another person toileting, which includes using toliet, bedpan, or urinal?: A Little Help from another person bathing (including washing, rinsing, drying)?: A Little Help from another person to put on and taking off regular upper body clothing?: None Help from another person to put on and taking off regular lower body clothing?: A Little 6 Click Score: 20   End of Session Equipment Utilized During Treatment: Gait belt;Rolling walker;Oxygen Nurse Communication: Mobility status  Activity Tolerance: Patient tolerated treatment well Patient left: in bed;with call bell/phone within reach;with bed alarm set  OT Visit Diagnosis: Unsteadiness on feet (R26.81);Muscle weakness (generalized) (M62.81)                Time: 1000-1023 OT Time Calculation (min): 23 min Charges:  OT General Charges $OT Visit: 1 Visit OT Evaluation $OT Eval Moderate Complexity: 1 Mod OT Treatments $Self Care/Home Management : 8-22 mins  Fredirick Maudlin, OTR/L [place]

## 2020-09-24 LAB — CBC
HCT: 40.8 % (ref 39.0–52.0)
Hemoglobin: 13.4 g/dL (ref 13.0–17.0)
MCH: 29.8 pg (ref 26.0–34.0)
MCHC: 32.8 g/dL (ref 30.0–36.0)
MCV: 90.7 fL (ref 80.0–100.0)
Platelets: 161 10*3/uL (ref 150–400)
RBC: 4.5 MIL/uL (ref 4.22–5.81)
RDW: 17.7 % — ABNORMAL HIGH (ref 11.5–15.5)
WBC: 8.9 10*3/uL (ref 4.0–10.5)
nRBC: 0 % (ref 0.0–0.2)

## 2020-09-24 LAB — BASIC METABOLIC PANEL
Anion gap: 10 (ref 5–15)
BUN: 32 mg/dL — ABNORMAL HIGH (ref 8–23)
CO2: 30 mmol/L (ref 22–32)
Calcium: 8.5 mg/dL — ABNORMAL LOW (ref 8.9–10.3)
Chloride: 94 mmol/L — ABNORMAL LOW (ref 98–111)
Creatinine, Ser: 0.83 mg/dL (ref 0.61–1.24)
GFR, Estimated: >60 mL/min (ref >60)
Glucose, Bld: 300 mg/dL — ABNORMAL HIGH (ref 70–99)
Potassium: 4 mmol/L (ref 3.5–5.1)
Sodium: 134 mmol/L — ABNORMAL LOW (ref 135–145)

## 2020-09-24 LAB — MAGNESIUM: Magnesium: 1.9 mg/dL (ref 1.7–2.4)

## 2020-09-24 LAB — ECHOCARDIOGRAM COMPLETE
AR max vel: 1.2 cm2
AV Peak grad: 10.4 mmHg
Ao pk vel: 1.62 m/s
Area-P 1/2: 4.63 cm2
Calc EF: 21.9 %
Height: 64 in
S' Lateral: 5.08 cm
Single Plane A2C EF: 20 %
Single Plane A4C EF: 25.8 %
Weight: 2160 oz

## 2020-09-24 LAB — GLUCOSE, CAPILLARY
Glucose-Capillary: 269 mg/dL — ABNORMAL HIGH (ref 70–99)
Glucose-Capillary: 168 mg/dL — ABNORMAL HIGH (ref 70–99)

## 2020-09-24 MED ORDER — ENTRESTO 49-51 MG PO TABS: ORAL_TABLET | ORAL | 5 refills | Status: DC

## 2020-09-24 MED ORDER — METHYLPREDNISOLONE 4 MG PO TBPK: 4.0000 mg | ORAL_TABLET | ORAL | Status: DC

## 2020-09-24 MED ORDER — NICOTINE 7 MG/24HR TD PT24: 7.0000 mg | MEDICATED_PATCH | Freq: Every day | TRANSDERMAL | 0 refills | Status: DC

## 2020-09-24 MED ORDER — METHYLPREDNISOLONE 4 MG PO TBPK: 8.0000 mg | ORAL_TABLET | Freq: Every evening | ORAL | Status: DC

## 2020-09-24 MED ORDER — OSELTAMIVIR PHOSPHATE 75 MG PO CAPS: 75.0000 mg | ORAL_CAPSULE | Freq: Two times a day (BID) | ORAL | 0 refills | Status: AC

## 2020-09-24 MED ORDER — METHYLPREDNISOLONE 4 MG PO TBPK: 8.0000 mg | ORAL_TABLET | Freq: Every morning | ORAL | Status: DC

## 2020-09-24 MED ORDER — METHYLPREDNISOLONE 4 MG PO TBPK: 4.0000 mg | ORAL_TABLET | Freq: Four times a day (QID) | ORAL | Status: DC

## 2020-09-24 MED ORDER — METHYLPREDNISOLONE 4 MG PO TBPK: 4.0000 mg | ORAL_TABLET | Freq: Three times a day (TID) | ORAL | Status: DC

## 2020-09-24 MED ORDER — METHYLPREDNISOLONE 4 MG PO TBPK: ORAL_TABLET | ORAL | 0 refills | Status: AC

## 2020-09-24 MED ORDER — TORSEMIDE 20 MG PO TABS: ORAL_TABLET | ORAL | 11 refills | Status: DC

## 2020-09-24 NOTE — Progress Notes (Signed)
Spoke with the patient and verified he lives alone but does have transportation, he has a PCP and specialist Dr as well, he can afford his medications, He has a cane at home and refuses additional DME, He stated that he has all of his needs

## 2020-09-24 NOTE — Progress Notes (Signed)
Inpatient Diabetes Program Recommendations  AACE/ADA: New Consensus Statement on Inpatient Glycemic Control   Target Ranges:  Prepandial:   less than 140 mg/dL      Peak postprandial:   less than 180 mg/dL (1-2 hours)      Critically ill patients:  140 - 180 mg/dL  Results for John Frank, John Frank (MRN [id number]) as of 09/24/2020 09:41  Ref. Range 09/23/2020 07:38 09/23/2020 11:43 09/23/2020 16:30 09/23/2020 21:31 09/24/2020 08:07  Glucose-Capillary Latest Ref Range: 70 - 99 mg/dL 227 (H) 291 (H) 166 (H) 244 (H) 269 (H)    Review of Glycemic Control  Diabetes history: DM2 Outpatient Diabetes medications: Jardiance 10 mg QAM, Amaryl 4 mg QAM, Metformin 2725 mg BID, Trulicity 0.5 mg weekly (Sunday) Current orders for Inpatient glycemic control: Novolog 0-15 units TID with meals, Novolog 0-5 units QHS  Inpatient Diabetes Program Recommendations:    Insulin: If steroids are continued, please consider ordering Lantus 6 units Q24H and Novolog 4 units TID with meals for meal coverage if patient eats at least 50% of meals.  NOTE: In reviewing chart, noted patient sees Dr. Honor Junes (Endocrinologist) and he was last seen on 06/07/20. Per office note on 06/07/20, patient's A1C was 8% and he was asked to increase Trulicity to 3 mg Qweek.   Thanks, Barnie Alderman, RN, MSN, CDE Diabetes Coordinator Inpatient Diabetes Program [phone] (Team Pager from 8am to 5pm)

## 2020-09-24 NOTE — Plan of Care (Signed)

## 2020-09-24 NOTE — Progress Notes (Signed)
Pt on room air, O2 saturation is 93 at rest and 90 with ambulation. MD notified.

## 2020-09-24 NOTE — Progress Notes (Signed)
Pulmonary Medicine          Date: 09/24/2020,   MRN# [id number] John Frank 09-19-1955     AdmissionWeight: 61.2 kg                 CurrentWeight: 61.2 kg   Referring physician: Dr Francine Graven   CHIEF COMPLAINT:   Acute hypoxemic respiratory failure   HISTORY OF PRESENT ILLNESS   John Frank is a 65 y.o. male with medical history significant for chronic systolic heart failure, cardiomyopathy status post AICD insertion, coronary artery disease, diabetes mellitus, GERD, COPD and liver cirrhosis who presents to the ER via EMS for evaluation of generalized weakness, headache, cough which is productive of clear phlegm and worsening shortness of breath. He has signficant hx of RLL lung cancer s/p radiation therapy x 5 with plan for surveillance CT chest and overall excellent progress with therapy.  He unfortanely did get influenza  A infection with subsequent acute hypoxemic respiratory failure.   I reviewed with him his CT chest and we discussed findings including labs and imaging.  I met with attending physician and we reviewed care plan together with patient at bedside.  We discussed short term medical plan over next 12-24 hours.  Patient is pleased and all questions answered.    09/24/20-Patient is improved he is on 2L/min [place] oxygen therapy, he may continue therapy at home with outpatient follow up.  Home O2 therapy ordered. Tamiflu on outpatient.  CBC, BMP reviewed this am and normal besides higher gluc post steroids.   PAST MEDICAL HISTORY   Past Medical History:  Diagnosis Date  . AICD (automatic cardioverter/defibrillator) present   . Angina at rest [facility])   . Aortic atherosclerosis ([place])   . CAD (coronary artery disease)    s/p stents in 2017  . Chronic systolic CHF (congestive heart failure) ([place])   . Cirrhosis of liver ([place])   . COPD (chronic obstructive pulmonary disease) ([place])   . Diabetes ([place])   . GERD (gastroesophageal reflux disease)   . Heart attack  ([place])   . Hyperlipidemia   . Hypertension   . Ischemic cardiomyopathy   . Paroxysmal ventricular tachycardia ([place])   . Right lower lobe lung mass   . Shortness of breath dyspnea   . Sleep apnea      SURGICAL HISTORY   Past Surgical History:  Procedure Laterality Date  . CARDIAC PACEMAKER PLACEMENT    . cardiac stents    . CARDIAC SURGERY     defibrillator and pacemaker  . CORONARY ANGIOPLASTY    . ESOPHAGOGASTRODUODENOSCOPY N/A 09/19/2014   Procedure: ESOPHAGOGASTRODUODENOSCOPY (EGD);  Surgeon: Lucilla Lame, MD;  Location[facility] END[facility]COPY;  Service: Endoscopy;  Laterality: N/A;  . ESOPHAGOGASTRODUODENOSCOPY (EGD) WITH PROPOFOL N/A 09/28/2017   Procedure: ESOPHAGOGASTRODUODENOSCOPY (EGD) WITH PROPOFOL;  Surgeon: Virgel Manifold, MD;  Location: [facility] ENDOSCOPY;  Service: Endoscopy;  Laterality: N/A;  . ESOPHAGOGASTRODUODENOSCOPY (EGD) WITH PROPOFOL N/A 04/19/2018   Procedure: ESOPHAGOGASTRODUODENOSCOPY (EGD) WITH PROPOFOL with Gastric Mapping;  Surgeon: [name], MD;  Location: [facility] ENDOSCOPY;  Service: Gastroenterology;  Laterality: N/A;  . ESOPHAGOGASTRODUODENOSCOPY (EGD) WITH PROPOFOL N/A 12/13/2018   Procedure: ESOPHAGOGASTRODUODENOSCOPY (EGD) WITH PROPOFOL;  Surgeon: [name], MD;  Location: [facility] ENDOSCOPY;  Service: Gastroenterology;  Laterality: N/A;  . ESOPHAGOGASTRODUODENOSCOPY (EGD) WITH PROPOFOL N/A 03/31/2019   Procedure: ESOPHAGOGASTRODUODENOSCOPY (EGD) WITH PROPOFOL;  Surgeon: Lin Landsman, MD;  Location: [facility] ENDOSCOPY;  Service: Gastroenterology;  Laterality: N/A;  . IMPLANTABLE CARDIOVERTER DEFIBRILLATOR (ICD) GENERATOR  CHANGE Left 02/21/2020   Procedure: ICD GENERATOR CHANGE;  Surgeon: Isaias Cowman, MD;  Location: [facility] ORS;  Service: Cardiovascular;  Laterality: Left;  Marland Kitchen VIDEO BRONCHOSCOPY WITH ENDOBRONCHIAL NAVIGATION N/A 06/22/2020   Procedure: VIDEO BRONCHOSCOPY WITH ENDOBRONCHIAL NAVIGATION;  Surgeon: Ottie Glazier, MD;  Location: [facility] ORS;   Service: Thoracic;  Laterality: N/A;  . VIDEO BRONCHOSCOPY WITH ENDOBRONCHIAL ULTRASOUND N/A 06/22/2020   Procedure: VIDEO BRONCHOSCOPY WITH ENDOBRONCHIAL ULTRASOUND;  Surgeon: Ottie Glazier, MD;  Location: [facility] ORS;  Service: Thoracic;  Laterality: N/A;     FAMILY HISTORY   Family History  Problem Relation Age of Onset  . Diabetes Mother   . Heart attack Father 75     SOCIAL HISTORY   Social History   Tobacco Use  . Smoking status: Current Every Day Smoker    Packs/day: 0.25    Years: 40.00    Pack years: 10.00    Types: Cigarettes  . Smokeless tobacco: Never Used  Vaping Use  . Vaping Use: Never used  Substance Use Topics  . Alcohol use: No  . Drug use: Never     MEDICATIONS    Home Medication:    Current Medication:  Current Facility-Administered Medications:  .  0.9 %  sodium chloride infusion, 250 mL, Intravenous, PRN, Agbata, Tochukwu, MD, Last Rate: 10 mL/hr at 09/22/20 1827, 250 mL at 09/22/20 1827 .  aspirin EC tablet 81 mg, 81 mg, Oral, Daily, Agbata, Tochukwu, MD, 81 mg at 09/23/20 0940 .  atorvastatin (LIPITOR) tablet 40 mg, 40 mg, Oral, Daily, Agbata, Tochukwu, MD, 40 mg at 09/23/20 0940 .  buPROPion [facility]) tablet 75 mg, 75 mg, Oral, Daily, Agbata, Tochukwu, MD, 75 mg at 09/23/20 0938 .  clopidogrel (PLAVIX) tablet 75 mg, 75 mg, Oral, Daily, Agbata, Tochukwu, MD, 75 mg at 09/23/20 0940 .  enoxaparin (LOVENOX) injection 40 mg, 40 mg, Subcutaneous, Q24H, Agbata, Tochukwu, MD, 40 mg at 09/23/20 2139 .  insulin aspart (novoLOG) injection 0-15 Units, 0-15 Units, Subcutaneous, TID WC, Agbata, Tochukwu, MD, 3 Units at 09/23/20 1720 .  insulin aspart (novoLOG) injection 0-5 Units, 0-5 Units, Subcutaneous, QHS, Lang Snow, NP, 2 Units at 09/23/20 2136 .  ipratropium-albuterol (DUONEB) 0.5-2.5 (3) MG/3ML nebulizer solution 3 mL, 3 mL, Nebulization, Once, Lavonia Drafts, MD .  ipratropium-albuterol (DUONEB) 0.5-2.5 (3) MG/3ML nebulizer solution  3 mL, 3 mL, Nebulization, Once, Lavonia Drafts, MD .  ipratropium-albuterol (DUONEB) 0.5-2.5 (3) MG/3ML nebulizer solution 3 mL, 3 mL, Nebulization, TID, Agbata, Tochukwu, MD, 3 mL at 09/23/20 1948 .  loratadine (CLARITIN) tablet 10 mg, 10 mg, Oral, Daily, Agbata, Tochukwu, MD, 10 mg at 09/23/20 0940 .  metoprolol succinate (TOPROL-XL) 24 hr tablet 100 mg, 100 mg, Oral, Daily, Agbata, Tochukwu, MD, 100 mg at 09/23/20 0940 .  mometasone-formoterol (DULERA) 200-5 MCG/ACT inhaler 2 puff, 2 puff, Inhalation, BID, Agbata, Tochukwu, MD, 2 puff at 09/23/20 2140 .  nicotine (NICODERM CQ - dosed in mg/24 hr) patch 7 mg, 7 mg, Transdermal, Daily, Agbata, Tochukwu, MD, 7 mg at 09/23/20 0939 .  oseltamivir (TAMIFLU) capsule 75 mg, 75 mg, Oral, BID, Agbata, Tochukwu, MD, 75 mg at 09/23/20 2138 .  pantoprazole (PROTONIX) EC tablet 40 mg, 40 mg, Oral, BID AC, Agbata, Tochukwu, MD, 40 mg at 09/23/20 1719 .  [COMPLETED] methylPREDNISolone sodium succinate (SOLU-MEDROL) 40 mg/mL injection 40 mg, 40 mg, Intravenous, Q12H, 40 mg at 09/23/20 1234 **FOLLOWED BY** predniSONE (DELTASONE) tablet 40 mg, 40 mg, Oral, Q breakfast, Agbata, Tochukwu, MD .  sodium chloride flush (  NS) 0.9 % injection 3 mL, 3 mL, Intravenous, Q12H, Agbata, Tochukwu, MD, 3 mL at 09/23/20 2139 .  sodium chloride flush (NS) 0.9 % injection 3 mL, 3 mL, Intravenous, PRN, Agbata, Tochukwu, MD .  spironolactone (ALDACTONE) tablet 12.5 mg, 12.5 mg, Oral, Daily, Agbata, Tochukwu, MD, 12.5 mg at 09/23/20 0941 .  sucralfate (CARAFATE) tablet 1 g, 1 g, Oral, QID, Agbata, Tochukwu, MD, 1 g at 09/23/20 2137 .  traMADol (ULTRAM) tablet 50 mg, 50 mg, Oral, Q12H PRN, Agbata, Tochukwu, MD, 50 mg at 09/22/20 2003    ALLERGIES   Patient has no known allergies.     REVIEW OF SYSTEMS    Review of Systems:  Gen:  Denies  fever, sweats, chills weigh loss  HEENT: Denies blurred vision, double vision, ear pain, eye pain, hearing loss, nose bleeds, sore  throat Cardiac:  No dizziness, chest pain or heaviness, chest tightness,edema Resp:   Denies cough or sputum porduction, shortness of breath,wheezing, hemoptysis,  Gi: Denies swallowing difficulty, stomach pain, nausea or vomiting, diarrhea, constipation, bowel incontinence Gu:  Denies bladder incontinence, burning urine Ext:   Denies Joint pain, stiffness or swelling Skin: Denies  skin rash, easy bruising or bleeding or hives Endoc:  Denies polyuria, polydipsia , polyphagia or weight change Psych:   Denies depression, insomnia or hallucinations   Other:  All other systems negative   VS: BP 111/66 (BP Location: Right Leg)   Pulse 62   Temp 97.7 F (36.5 C)   Resp 17   Ht '5\' 4"'  (1.626 m)   Wt 61.2 kg   SpO2 96%   BMI 23.17 kg/m      PHYSICAL EXAM    GENERAL:NAD, no fevers, chills, no weakness no fatigue HEAD: Normocephalic, atraumatic.  EYES: Pupils equal, round, reactive to light. Extraocular muscles intact. No scleral icterus.  MOUTH: Moist mucosal membrane. Dentition intact. No abscess noted.  EAR, NOSE, THROAT: Clear without exudates. No external lesions.  NECK: Supple. No thyromegaly. No nodules. No JVD.  PULMONARY: Diffuse coarse rhonchi right sided +wheezes CARDIOVASCULAR: S1 and S2. Regular rate and rhythm. No murmurs, rubs, or gallops. No edema. Pedal pulses 2+ bilaterally.  GASTROINTESTINAL: Soft, nontender, nondistended. No masses. Positive bowel sounds. No hepatosplenomegaly.  MUSCULOSKELETAL: No swelling, clubbing, or edema. Range of motion full in all extremities.  NEUROLOGIC: Cranial nerves II through XII are intact. No gross focal neurological deficits. Sensation intact. Reflexes intact.  SKIN: No ulceration, lesions, rashes, or cyanosis. Skin warm and dry. Turgor intact.  PSYCHIATRIC: Mood, affect within normal limits. The patient is awake, alert and oriented x 3. Insight, judgment intact.       IMAGING    DG Chest 2 View  Result Date:  09/10/2020 CLINICAL DATA:  Shortness of breath and cough. EXAM: CHEST - 2 VIEW COMPARISON:  August 27, 2020 FINDINGS: The lungs are hyperinflated. A dual lead AICD is noted. Chronic appearing increased lung markings are seen without evidence of acute infiltrate, pleural effusion or pneumothorax. The heart size and mediastinal contours are within normal limits. Chronic fourth and fifth right rib fractures are seen. Degenerative changes are seen throughout the thoracic spine. IMPRESSION: No active cardiopulmonary disease. Electronically Signed   By: Virgina Norfolk M.D.   On: 09/10/2020 19:18   DG Chest 2 View  Result Date: 08/27/2020 CLINICAL DATA:  Shortness of breath and weakness for several days. EXAM: CHEST - 2 VIEW COMPARISON:  08/01/2020 FINDINGS: The heart size and mediastinal contours are within normal limits. Dual lead  transvenous pacemaker remains in appropriate position. Increased central peribronchial thickening is noted. New linear opacity in the left perihilar region is consistent with mild subsegmental atelectasis. No evidence of pulmonary consolidation or pleural effusion. Several old right rib fracture deformities are again noted. IMPRESSION: New central peribronchial thickening and mild left perihilar subsegmental atelectasis. No evidence of pulmonary consolidation or pleural effusion. Electronically Signed   By: Marlaine Hind M.D.   On: 08/27/2020 11:51   CT CHEST WO CONTRAST  Result Date: 09/22/2020 CLINICAL DATA:  65 year old presenting with cough, headache, generalized weakness and hypoxia (oxygen saturation 86% on room air which drops to 80% when coughing. Known RIGHT LOWER LOBE lung mass with a prior nondiagnostic endobronchial biopsy. EXAM: CT CHEST WITHOUT CONTRAST TECHNIQUE: Multidetector CT imaging of the chest was performed following the standard protocol without IV contrast. COMPARISON:  CTA chest 06/30/2020. FINDINGS: Respiratory motion blurs images throughout the examination.  Cardiovascular: LEFT subclavian dual lead transvenous pacemaker with the lead tips at the RIGHT atrial appendage and RV apex. RIGHT coronary artery stent. Severe three-vessel coronary atherosclerosis. Stable moderate cardiomegaly. Moderate to severe atherosclerosis involving the thoracic and upper abdominal aorta without evidence of aneurysm. Atherosclerosis involving the proximal great vessels. Mediastinum/Nodes: No pathologically enlarged mediastinal, hilar or axillary lymph nodes. No mediastinal masses. Normal-appearing esophagus. Lungs/Pleura: Mass with associated architectural distortion/spiculation involving the medial RIGHT LOWER LOBE measuring approximately 2.3 x 2.2 cm (3/83), unchanged from the 06/30/2020 CT but slightly increased in size since the 04/02/2020 CT. Minimal focal scarring in the RIGHT LOWER LOBE posteriorly, unchanged apart from the fact that the calcifications associated with the scar are less conspicuous. 4 mm subpleural nodule in the anterolateral RIGHT UPPER LOBE (3/58), unchanged dating back to December, 2016, confirming benignity. Peripheral nodular scarring in the posterolateral LEFT LOWER LOBE (3/98), unchanged dating back to December, 2016. No new or enlarging nodules elsewhere in either lung. No confluent or ground-glass airspace consolidation. No evidence of interstitial lung disease. Small blebs in the lung apices and emphysematous changes throughout both lungs. No pleural effusions blebs or air cyst in the inferior LEFT UPPER LOBE. Central airways patent with moderate to severe bronchial wall thickening. Upper Abdomen: Unremarkable for the unenhanced technique. Prior cholecystectomy. Musculoskeletal: Diffuse thoracic spondylosis. Mild compression fracture of the upper endplate of T6 which does not appear acute. Degenerative disc disease and spondylosis at C6-7. Osseous demineralization. No acute findings. IMPRESSION: 1. Approximate 2.3 cm suspicious mass involving the RIGHT LOWER  LOBE medially, unchanged since the 06/30/2020 CT but increased in size since the 04/02/2020 CT. 2. No new/acute cardiopulmonary disease. 3. Stable moderate cardiomegaly. Three-vessel coronary atherosclerosis. Aortic Atherosclerosis (ICD10-I70.0) and Emphysema (ICD10-J43.9). Electronically Signed   By: Evangeline Dakin M.D.   On: 09/22/2020 16:42   DG Chest Portable 1 View  Result Date: 09/22/2020 CLINICAL DATA:  Shortness of breath and cough. EXAM: PORTABLE CHEST 1 VIEW COMPARISON:  09/10/2020. FINDINGS: Patient's LEFT-sided transvenous pacemaker with leads to the RIGHT atrium and RIGHT ventricle. Heart size is normal. The lungs are free of focal consolidations and pleural effusions. Chronic RIGHT-sided rib fractures. IMPRESSION: No evidence for acute cardiopulmonary abnormality. Electronically Signed   By: Nolon Nations M.D.   On: 09/22/2020 14:52   ECHOCARDIOGRAM COMPLETE  Result Date: 09/24/2020    ECHOCARDIOGRAM REPORT   Patient Name:   John Frank Date of Exam: 09/23/2020 Medical Rec #:  [id number]      Height:       64.0 in Accession #:    [id number]  Weight:       135.0 lb Date of Birth:  Dec 29, 1955      BSA:          1.655 m Patient Age:    66 years       BP:           120/74 mmHg Patient Gender: M              HR:           100 bpm. Exam Location:  [facility] Procedure: 2D Echo, Cardiac Doppler and Color Doppler Indications:     CHF-Acute Systolic P53.00  History:         Patient has prior history of Echocardiogram examinations. CAD,                  Signs/Symptoms:Shortness of Breath; Risk Factors:Hypertension.  Sonographer:     Alyse Low Roar Referring Phys:  [place] Diagnosing Phys: Serafina Royals MD IMPRESSIONS  1. Left ventricular ejection fraction, by estimation, is <20%. The left ventricle has severely decreased function. The left ventricle demonstrates global hypokinesis. The left ventricular internal cavity size was severely dilated. Left ventricular diastolic parameters are  consistent with Grade II diastolic dysfunction (pseudonormalization).  2. Right ventricular systolic function is normal. The right ventricular size is normal.  3. Left atrial size was mildly dilated.  4. The mitral valve is normal in structure. Mild to moderate mitral valve regurgitation.  5. The aortic valve is normal in structure. Aortic valve regurgitation is not visualized. FINDINGS  Left Ventricle: Left ventricular ejection fraction, by estimation, is <20%. The left ventricle has severely decreased function. The left ventricle demonstrates global hypokinesis. The left ventricular internal cavity size was severely dilated. There is no left ventricular hypertrophy. Left ventricular diastolic parameters are consistent with Grade II diastolic dysfunction (pseudonormalization). Right Ventricle: The right ventricular size is normal. No increase in right ventricular wall thickness. Right ventricular systolic function is normal. Left Atrium: Left atrial size was mildly dilated. Right Atrium: Right atrial size was normal in size. Pericardium: There is no evidence of pericardial effusion. Mitral Valve: The mitral valve is normal in structure. Mild to moderate mitral valve regurgitation. Tricuspid Valve: The tricuspid valve is normal in structure. Tricuspid valve regurgitation is mild. Aortic Valve: The aortic valve is normal in structure. Aortic valve regurgitation is not visualized. Aortic valve peak gradient measures 10.4 mmHg. Pulmonic Valve: The pulmonic valve was grossly normal. Pulmonic valve regurgitation is not visualized. Aorta: The aortic root and ascending aorta are structurally normal, with no evidence of dilitation. IAS/Shunts: No atrial level shunt detected by color flow Doppler.  LEFT VENTRICLE PLAX 2D LVIDd:         5.64 cm      Diastology LVIDs:         5.08 cm      LV e' medial:    4.90 cm/s LV PW:         0.83 cm      LV E/e' medial:  20.4 LV IVS:        1.11 cm      LV e' lateral:   6.64 cm/s LVOT  diam:     1.70 cm      LV E/e' lateral: 15.1 LVOT Area:     2.27 cm  LV Volumes (MOD) LV vol d, MOD A2C: 190.0 ml LV vol d, MOD A4C: 131.0 ml LV vol s, MOD A2C: 152.0 ml LV vol s, MOD A4C: 97.2 ml  LV SV MOD A2C:     38.0 ml LV SV MOD A4C:     131.0 ml LV SV MOD BP:      36.6 ml RIGHT VENTRICLE RV Mid diam:    3.19 cm RV S prime:     9.14 cm/s TAPSE (M-mode): 1.3 cm LEFT ATRIUM             Index       RIGHT ATRIUM           Index LA diam:        3.80 cm 2.30 cm/m  RA Area:     16.40 cm LA Vol (A2C):   54.3 ml 32.80 ml/m RA Volume:   43.00 ml  25.98 ml/m LA Vol (A4C):   35.7 ml 21.57 ml/m LA Biplane Vol: 44.4 ml 26.82 ml/m  AORTIC VALVE                PULMONIC VALVE AV Area (Vmax): 1.20 cm    PV Vmax:        0.91 m/s AV Vmax:        161.50 cm/s PV Peak grad:   3.3 mmHg AV Peak Grad:   10.4 mmHg   RVOT Peak grad: 1 mmHg LVOT Vmax:      85.70 cm/s  AORTA Ao Root diam: 2.30 cm MITRAL VALVE                TRICUSPID VALVE MV Area (PHT): 4.63 cm     TR Peak grad:   26.6 mmHg MV Decel Time: 164 msec     TR Vmax:        258.00 cm/s MV E velocity: 100.00 cm/s MV A velocity: 63.80 cm/s   SHUNTS MV E/A ratio:  1.57         Systemic Diam: 1.70 cm MV A Prime:    5.9 cm/s Serafina Royals MD Electronically signed by Serafina Royals MD Signature Date/Time: 09/24/2020/6:28:20 AM    Final       ASSESSMENT/PLAN   Acute hypoxemic respiratory failure  - due to influenza A infection    - tamiflu   - medrol dose pack   -O2 concentrator 3-75mths   Right lower lobe lung cancer   - mildly edematous focus post SBRT with interval increase in size - suspect some of this is context of bronchitic changes from influenza and overlying inflammatory changes post SBRT   - continue with current therapy for flu and repeat imaging on outpatient per rad/onc and med/onc plan       Thank you for allowing me to participate in the care of this patient.  Total face to face encounter time for this patient visit was 40 min. >50% of the  time was  spent in counseling and coordination of care.   Patient/Family are satisfied with care plan and all questions have been answered.  This document was prepared using Dragon voice recognition software and may include unintentional dictation errors.     FOttie Glazier M.D.  Division of [place]

## 2020-09-24 NOTE — Discharge Summary (Signed)
Physician Discharge Summary   EESA JUSTISS  male DOB: 1955-08-21  GOT:[id number]  PCP: Idelle Crouch, MD  Admit date: 09/22/2020 Discharge date: 09/24/2020  Admitted From: home Disposition:  home Home Health: pt declined CODE STATUS: Full code  Discharge Instructions    Discharge instructions   Complete by: As directed    Please continue 3 more days of Tamiflu and finish steroid taper with Medrol dose pak at home as directed.  Because your blood pressure has been low, we are holding your Entresto and torsemide until you follow up with your outpatient doctor.   Dr. Enzo Bi [facility] Course:  For full details, please see H&P, progress notes, consult notes and ancillary notes.  Briefly,  Salem Caster a 65 y.o.malewith medical history significant forchronic systolic heart failure, cardiomyopathy status post AICD insertion, coronary artery disease, diabetes mellitus, GERD, COPD and liver cirrhosiswho presented to the ER via EMS for evaluation of generalized weakness, headache, cough which was productive of clear phlegm and worsening shortness of breath.  Per EMS patient had room air pulse oximetry of 86% which dropped to 80% when he coughs. Patient was placed on 6 L of oxygen with improvement in his pulse oximetry to 92% and was transported to the ER.   Acute hypoxic respiratory failure Plan: --treated for flu and COPD exacerbation. --ambulation test prior to discharge showed 90% on room air with walking, so pt was not discharged on supplemental oxygen.  Influenza A --started on Tamiflu and discharged on 3 more days.  COPD with acute exacerbation --started on solumedrol and DuoNeb on admission --cont Dulera --pulm consulted on admission, and recommended discharge on Medrol dose pak taper.  RLL lung cancer s/p radiation therapy  --follows with pulm Dr. Lanney Gins as outpatient --Per Dr. Lanney Gins, "mildly edematous focus post SBRT with  interval increase in size - suspect some of this is context of bronchitic changes from influenza and overlying inflammatory changes post SBRT" --Pulm consult, Dr. Lanney Gins seeing - continue with current therapy for flu and repeat imaging on outpatient per rad/onc and med/onc plan  Chronic systolic heart failure Not acutely exacerbated Last known LVEF of 40 to 45% --cont metoprolol and spironolactone --Entresto and torsemide held due to low BP, pending outpatient followup.  Current smoker Smoking cessation has been discussed with patient in detail --Nicotine patch  Depression Continue Wellbutrin  History of coronary artery disease Continue Plavix, aspirin, Lipitor and beta-blocker  Diabetes mellitus Held hypoglycemic agent while inpatient, resumed at discharge.   Discharge Diagnoses:  Principal Problem:   Acute respiratory failure ([place]) Active Problems:   CAD (coronary artery disease)   Diabetes mellitus without complication (HCC)   COPD, severe (HCC)   Tobacco abuse   Chronic systolic heart failure (HCC)   Influenza A   Depression   Right lower lobe lung mass   30 Day Unplanned Readmission Risk Score   Flowsheet Row ED to Hosp-Admission (Current) from 09/22/2020 in [facility] (1A)  30 Day Unplanned Readmission Risk Score (%) 26.14 Filed at 09/24/2020 0801     This score is the patient's risk of an unplanned readmission within 30 days of being discharged (0 -100%). The score is based on dignosis, age, lab data, medications, orders, and past utilization.   Low:  0-14.9   Medium: 15-21.9   High: 22-29.9   Extreme: 30 and above        Discharge Instructions:  Allergies as  of 09/24/2020   No Known Allergies     Medication List    STOP taking these medications   predniSONE 50 MG tablet Commonly known as: DELTASONE     TAKE these medications   Accu-Chek Guide Me w/Device Kit   aspirin 81 MG EC tablet Take 81 mg by  mouth daily.   atorvastatin 40 MG tablet Commonly known as: LIPITOR TAKE 1 TABLET (40 MG TOTAL) BY MOUTH DAILY.   budesonide-formoterol 160-4.5 MCG/ACT inhaler Commonly known as: SYMBICORT Inhale 2 puffs into the lungs 2 (two) times daily.   buPROPion 75 MG tablet Commonly known as: WELLBUTRIN Take 75 mg by mouth daily.   clopidogrel 75 MG tablet Commonly known as: PLAVIX TAKE 1 TABLET (75 MG TOTAL) BY MOUTH DAILY.   empagliflozin 10 MG Tabs tablet Commonly known as: JARDIANCE Take 10 mg by mouth daily with breakfast.   Entresto 49-51 MG Generic drug: sacubitril-valsartan Hold until followup with your outpatient doctor due to low blood pressure. What changed:   how much to take  how to take this  when to take this  additional instructions   glimepiride 4 MG tablet Commonly known as: AMARYL Take 4 mg by mouth daily with breakfast.   ipratropium-albuterol 0.5-2.5 (3) MG/3ML Soln Commonly known as: DUONEB Take 3 mLs by nebulization in the morning, at noon, and at bedtime.   levocetirizine 5 MG tablet Commonly known as: XYZAL Take 5 mg by mouth every evening.   metFORMIN 1000 MG tablet Commonly known as: GLUCOPHAGE TAKE 1 TABLET (1,000 MG TOTAL) BY MOUTH 2 (TWO) TIMES DAILY WITH A MEAL. What changed: when to take this   methylPREDNISolone 4 MG Tbpk tablet Commonly known as: MEDROL DOSEPAK Take 24 mg (6 tablets) by mouth on day one, followed by 20 mg (5 tablets) on day 2, 16 mg (4 tablets) on day 3, 12 mg (3 tablets) on day 4, 8 mg (2 tablets) on day 5, and 4 mg (1 tablet) on day 6 Start taking on: Sep 25, 2020   metoprolol succinate 100 MG 24 hr tablet Commonly known as: TOPROL-XL TAKE 1 TABLET BY MOUTH EVERY DAY   nicotine 7 mg/24hr patch Commonly known as: NICODERM CQ - dosed in mg/24 hr Place 1 patch (7 mg total) onto the skin daily. Start taking on: Sep 25, 2020   ondansetron 4 MG disintegrating tablet Commonly known as: Zofran ODT Take 1 tablet (4  mg total) by mouth every 8 (eight) hours as needed.   ONE TOUCH ULTRA TEST test strip Generic drug: glucose blood USE TO TEST BLOOD SUGAR ONCE DAILY   oseltamivir 75 MG capsule Commonly known as: TAMIFLU Take 1 capsule (75 mg total) by mouth 2 (two) times daily for 3 days. For your flu.   pantoprazole 40 MG tablet Commonly known as: Protonix Take 1 tablet (40 mg total) by mouth 2 (two) times daily before a meal.   promethazine 25 MG tablet Commonly known as: PHENERGAN Take 1 tablet (25 mg total) by mouth every 6 (six) hours as needed for nausea or vomiting.   spironolactone 25 MG tablet Commonly known as: ALDACTONE TAKE 1/2 TABLET BY MOUTH EVERY DAY   sucralfate 1 g tablet Commonly known as: CARAFATE Take 1 g by mouth 4 (four) times daily.   torsemide 20 MG tablet Commonly known as: DEMADEX Hold until followup with your outpatient doctor due to your low blood pressure. What changed:   how much to take  how to take this  when to take this  additional instructions   traMADol 50 MG tablet Commonly known as: ULTRAM Take 50 mg by mouth 2 (two) times daily as needed.   Trulicity 1.5 PZ/0.2HE Sopn Generic drug: Dulaglutide Inject 0.5 mg into the skin every 'Sunday.            Durable Medical Equipment  (From admission, onward)         Start     Ordered   09/24/20 0705  For home use only DME oxygen  Once       Question Answer Comment  Length of Need 6 Months   Mode or (Route) Nasal cannula   Liters per Minute 2   Frequency Continuous (stationary and portable oxygen unit needed)   Oxygen conserving device Yes   Oxygen delivery system Other see comments      05' /16/22 5277           Follow-up Information    Idelle Crouch, MD. Schedule an appointment as soon as possible for a visit in 1 week(s).   Specialty: Internal Medicine Contact information: [place] [address] [phone]        Ottie Glazier, MD Follow up.   Specialty: Pulmonary Disease Contact information: [place] [address] [phone]               No Known Allergies   The results of significant diagnostics from this hospitalization (including imaging, microbiology, ancillary and laboratory) are listed below for reference.   Consultations:   Procedures/Studies: DG Chest 2 View  Result Date: 09/10/2020 CLINICAL DATA:  Shortness of breath and cough. EXAM: CHEST - 2 VIEW COMPARISON:  August 27, 2020 FINDINGS: The lungs are hyperinflated. A dual lead AICD is noted. Chronic appearing increased lung markings are seen without evidence of acute infiltrate, pleural effusion or pneumothorax. The heart size and mediastinal contours are within normal limits. Chronic fourth and fifth right rib fractures are seen. Degenerative changes are seen throughout the thoracic spine. IMPRESSION: No active cardiopulmonary disease. Electronically Signed   By: Virgina Norfolk M.D.   On: 09/10/2020 19:18   DG Chest 2 View  Result Date: 08/27/2020 CLINICAL DATA:  Shortness of breath and weakness for several days. EXAM: CHEST - 2 VIEW COMPARISON:  08/01/2020 FINDINGS: The heart size and mediastinal contours are within normal limits. Dual lead transvenous pacemaker remains in appropriate position. Increased central peribronchial thickening is noted. New linear opacity in the left perihilar region is consistent with mild subsegmental atelectasis. No evidence of pulmonary consolidation or pleural effusion. Several old right rib fracture deformities are again noted. IMPRESSION: New central peribronchial thickening and mild left perihilar subsegmental atelectasis. No evidence of pulmonary consolidation or pleural effusion. Electronically Signed   By: Marlaine Hind M.D.   On: 08/27/2020 11:51   CT CHEST WO CONTRAST  Result Date: 09/22/2020 CLINICAL DATA:  65 year old presenting with cough, headache, generalized weakness and  hypoxia (oxygen saturation 86% on room air which drops to 80% when coughing. Known RIGHT LOWER LOBE lung mass with a prior nondiagnostic endobronchial biopsy. EXAM: CT CHEST WITHOUT CONTRAST TECHNIQUE: Multidetector CT imaging of the chest was performed following the standard protocol without IV contrast. COMPARISON:  CTA chest 06/30/2020. FINDINGS: Respiratory motion blurs images throughout the examination. Cardiovascular: LEFT subclavian dual lead transvenous pacemaker with the lead tips at the RIGHT atrial appendage and RV apex. RIGHT coronary artery stent. Severe three-vessel coronary atherosclerosis. Stable moderate cardiomegaly. Moderate  to severe atherosclerosis involving the thoracic and upper abdominal aorta without evidence of aneurysm. Atherosclerosis involving the proximal great vessels. Mediastinum/Nodes: No pathologically enlarged mediastinal, hilar or axillary lymph nodes. No mediastinal masses. Normal-appearing esophagus. Lungs/Pleura: Mass with associated architectural distortion/spiculation involving the medial RIGHT LOWER LOBE measuring approximately 2.3 x 2.2 cm (3/83), unchanged from the 06/30/2020 CT but slightly increased in size since the 04/02/2020 CT. Minimal focal scarring in the RIGHT LOWER LOBE posteriorly, unchanged apart from the fact that the calcifications associated with the scar are less conspicuous. 4 mm subpleural nodule in the anterolateral RIGHT UPPER LOBE (3/58), unchanged dating back to December, 2016, confirming benignity. Peripheral nodular scarring in the posterolateral LEFT LOWER LOBE (3/98), unchanged dating back to December, 2016. No new or enlarging nodules elsewhere in either lung. No confluent or ground-glass airspace consolidation. No evidence of interstitial lung disease. Small blebs in the lung apices and emphysematous changes throughout both lungs. No pleural effusions blebs or air cyst in the inferior LEFT UPPER LOBE. Central airways patent with moderate to  severe bronchial wall thickening. Upper Abdomen: Unremarkable for the unenhanced technique. Prior cholecystectomy. Musculoskeletal: Diffuse thoracic spondylosis. Mild compression fracture of the upper endplate of T6 which does not appear acute. Degenerative disc disease and spondylosis at C6-7. Osseous demineralization. No acute findings. IMPRESSION: 1. Approximate 2.3 cm suspicious mass involving the RIGHT LOWER LOBE medially, unchanged since the 06/30/2020 CT but increased in size since the 04/02/2020 CT. 2. No new/acute cardiopulmonary disease. 3. Stable moderate cardiomegaly. Three-vessel coronary atherosclerosis. Aortic Atherosclerosis (ICD10-I70.0) and Emphysema (ICD10-J43.9). Electronically Signed   By: Evangeline Dakin M.D.   On: 09/22/2020 16:42   DG Chest Portable 1 View  Result Date: 09/22/2020 CLINICAL DATA:  Shortness of breath and cough. EXAM: PORTABLE CHEST 1 VIEW COMPARISON:  09/10/2020. FINDINGS: Patient's LEFT-sided transvenous pacemaker with leads to the RIGHT atrium and RIGHT ventricle. Heart size is normal. The lungs are free of focal consolidations and pleural effusions. Chronic RIGHT-sided rib fractures. IMPRESSION: No evidence for acute cardiopulmonary abnormality. Electronically Signed   By: Nolon Nations M.D.   On: 09/22/2020 14:52   ECHOCARDIOGRAM COMPLETE  Result Date: 09/24/2020    ECHOCARDIOGRAM REPORT   Patient Name:   John Frank Date of Exam: 09/23/2020 Medical Rec #:  [id number]      Height:       64.0 in Accession #:    [id number]     Weight:       135.0 lb Date of Birth:  [birth year]      BSA:          1.655 m Patient Age:    65 years       BP:           120/74 mmHg Patient Gender: M              HR:           100 bpm. Exam Location:  [facility] Procedure: 2D Echo, Cardiac Doppler and Color Doppler Indications:     CHF-Acute Systolic G26.94  History:         Patient has prior history of Echocardiogram examinations. CAD,                  Signs/Symptoms:Shortness of Breath;  Risk Factors:Hypertension.  Sonographer:     Alyse Low Roar Referring Phys:  [place] Diagnosing Phys: Serafina Royals MD IMPRESSIONS  1. Left ventricular ejection fraction, by estimation, is <20%. The left ventricle has severely decreased function.  The left ventricle demonstrates global hypokinesis. The left ventricular internal cavity size was severely dilated. Left ventricular diastolic parameters are consistent with Grade II diastolic dysfunction (pseudonormalization).  2. Right ventricular systolic function is normal. The right ventricular size is normal.  3. Left atrial size was mildly dilated.  4. The mitral valve is normal in structure. Mild to moderate mitral valve regurgitation.  5. The aortic valve is normal in structure. Aortic valve regurgitation is not visualized. FINDINGS  Left Ventricle: Left ventricular ejection fraction, by estimation, is <20%. The left ventricle has severely decreased function. The left ventricle demonstrates global hypokinesis. The left ventricular internal cavity size was severely dilated. There is no left ventricular hypertrophy. Left ventricular diastolic parameters are consistent with Grade II diastolic dysfunction (pseudonormalization). Right Ventricle: The right ventricular size is normal. No increase in right ventricular wall thickness. Right ventricular systolic function is normal. Left Atrium: Left atrial size was mildly dilated. Right Atrium: Right atrial size was normal in size. Pericardium: There is no evidence of pericardial effusion. Mitral Valve: The mitral valve is normal in structure. Mild to moderate mitral valve regurgitation. Tricuspid Valve: The tricuspid valve is normal in structure. Tricuspid valve regurgitation is mild. Aortic Valve: The aortic valve is normal in structure. Aortic valve regurgitation is not visualized. Aortic valve peak gradient measures 10.4 mmHg. Pulmonic Valve: The pulmonic valve was grossly normal. Pulmonic valve  regurgitation is not visualized. Aorta: The aortic root and ascending aorta are structurally normal, with no evidence of dilitation. IAS/Shunts: No atrial level shunt detected by color flow Doppler.  LEFT VENTRICLE PLAX 2D LVIDd:         5.64 cm      Diastology LVIDs:         5.08 cm      LV e' medial:    4.90 cm/s LV PW:         0.83 cm      LV E/e' medial:  20.4 LV IVS:        1.11 cm      LV e' lateral:   6.64 cm/s LVOT diam:     1.70 cm      LV E/e' lateral: 15.1 LVOT Area:     2.27 cm  LV Volumes (MOD) LV vol d, MOD A2C: 190.0 ml LV vol d, MOD A4C: 131.0 ml LV vol s, MOD A2C: 152.0 ml LV vol s, MOD A4C: 97.2 ml LV SV MOD A2C:     38.0 ml LV SV MOD A4C:     131.0 ml LV SV MOD BP:      36.6 ml RIGHT VENTRICLE RV Mid diam:    3.19 cm RV S prime:     9.14 cm/s TAPSE (M-mode): 1.3 cm LEFT ATRIUM             Index       RIGHT ATRIUM           Index LA diam:        3.80 cm 2.30 cm/m  RA Area:     16.40 cm LA Vol (A2C):   54.3 ml 32.80 ml/m RA Volume:   43.00 ml  25.98 ml/m LA Vol (A4C):   35.7 ml 21.57 ml/m LA Biplane Vol: 44.4 ml 26.82 ml/m  AORTIC VALVE                PULMONIC VALVE AV Area (Vmax): 1.20 cm    PV Vmax:        0.91 m/s AV  Vmax:        161.50 cm/s PV Peak grad:   3.3 mmHg AV Peak Grad:   10.4 mmHg   RVOT Peak grad: 1 mmHg LVOT Vmax:      85.70 cm/s  AORTA Ao Root diam: 2.30 cm MITRAL VALVE                TRICUSPID VALVE MV Area (PHT): 4.63 cm     TR Peak grad:   26.6 mmHg MV Decel Time: 164 msec     TR Vmax:        258.00 cm/s MV E velocity: 100.00 cm/s MV A velocity: 63.80 cm/s   SHUNTS MV E/A ratio:  1.57         Systemic Diam: 1.70 cm MV A Prime:    5.9 cm/s Serafina Royals MD Electronically signed by Serafina Royals MD Signature Date/Time: 09/24/2020/6:28:20 AM    Final       Labs: BNP (last 3 results) Recent Labs    01/26/20 1134 08/27/20 1133 09/22/20 1357  BNP 103.1* 822.9* 5,465.0*   Basic Metabolic Panel: Recent Labs  Lab 09/18/20 0924 09/22/20 1354 09/23/20 0505  09/24/20 0502  NA 133* 138 138 134*  K 3.9 4.7 4.2 4.0  CL 99 102 97* 94*  CO2 '25 28 29 30  ' GLUCOSE 184* 172* 243* 300*  BUN 19 25* 26* 32*  CREATININE 0.77 0.69 0.68 0.83  CALCIUM 8.7* 8.7* 8.6* 8.5*  MG  --   --   --  1.9   Liver Function Tests: Recent Labs  Lab 09/22/20 1354  AST 26  ALT 23  ALKPHOS 111  BILITOT 0.9  PROT 6.8  ALBUMIN 3.8   No results for input(s): LIPASE, AMYLASE in the last 168 hours. No results for input(s): AMMONIA in the last 168 hours. CBC: Recent Labs  Lab 09/22/20 1509 09/23/20 0505 09/24/20 0502  WBC 5.7 4.2 8.9  NEUTROABS 4.6  --   --   HGB 12.7* 13.2 13.4  HCT 40.8 41.3 40.8  MCV 94.0 92.8 90.7  PLT 160 164 161   Cardiac Enzymes: No results for input(s): CKTOTAL, CKMB, CKMBINDEX, TROPONINI in the last 168 hours. BNP: Invalid input(s): POCBNP CBG: Recent Labs  Lab 09/23/20 0738 09/23/20 1143 09/23/20 1630 09/23/20 2131 09/24/20 0807  GLUCAP 227* 291* 166* 244* 269*   D-Dimer No results for input(s): DDIMER in the last 72 hours. Hgb A1c Recent Labs    09/22/20 1749  HGBA1C 9.6*   Lipid Profile No results for input(s): CHOL, HDL, LDLCALC, TRIG, CHOLHDL, LDLDIRECT in the last 72 hours. Thyroid function studies No results for input(s): TSH, T4TOTAL, T3FREE, THYROIDAB in the last 72 hours.  Invalid input(s): FREET3 Anemia work up No results for input(s): VITAMINB12, FOLATE, FERRITIN, TIBC, IRON, RETICCTPCT in the last 72 hours. Urinalysis    Component Value Date/Time   COLORURINE YELLOW (A) 06/30/2020 2151   APPEARANCEUR CLEAR (A) 06/30/2020 2151   LABSPEC 1.023 06/30/2020 2151   PHURINE 6.0 06/30/2020 2151   GLUCOSEU >=500 (A) 06/30/2020 2151   HGBUR NEGATIVE 06/30/2020 2151   BILIRUBINUR NEGATIVE 06/30/2020 2151   [place] NEGATIVE 06/30/2020 2151   PROTEINUR NEGATIVE 06/30/2020 2151   NITRITE NEGATIVE 06/30/2020 2151   LEUKOCYTESUR NEGATIVE 06/30/2020 2151   Sepsis Labs Invalid input(s): PROCALCITONIN,   WBC,  LACTICIDVEN Microbiology Recent Results (from the past 240 hour(s))  Resp Panel by RT-PCR (Flu A&B, Covid) Nasopharyngeal Swab     Status: Abnormal   Collection Time:  09/22/20  3:25 PM   Specimen: Nasopharyngeal Swab; Nasopharyngeal(NP) swabs in vial transport medium  Result Value Ref Range Status   SARS Coronavirus 2 by RT PCR NEGATIVE NEGATIVE Final    Comment: (NOTE) SARS-CoV-2 target nucleic acids are NOT DETECTED.  The SARS-CoV-2 RNA is generally detectable in upper respiratory specimens during the acute phase of infection. The lowest concentration of SARS-CoV-2 viral copies this assay can detect is 138 copies/mL. A negative result does not preclude SARS-Cov-2 infection and should not be used as the sole basis for treatment or other patient management decisions. A negative result may occur with  improper specimen collection/handling, submission of specimen other than nasopharyngeal swab, presence of viral mutation(s) within the areas targeted by this assay, and inadequate number of viral copies(<138 copies/mL). A negative result must be combined with clinical observations, patient history, and epidemiological information. The expected result is Negative.  Fact Sheet for Patients:  EntrepreneurPulse.com.au  Fact Sheet for Healthcare Providers:  IncredibleEmployment.be  This test is no t yet approved or cleared by the [place] FDA and  has been authorized for detection and/or diagnosis of SARS-CoV-2 by FDA under an Emergency Use Authorization (EUA). This EUA will remain  in effect (meaning this test can be used) for the duration of the COVID-19 declaration under Section 564(b)(1) of the Act, 21 U.S.C.section 360bbb-3(b)(1), unless the authorization is terminated  or revoked sooner.       Influenza A by PCR POSITIVE (A) NEGATIVE Final   Influenza B by PCR NEGATIVE NEGATIVE Final    Comment: (NOTE) The Xpert Xpress  SARS-CoV-2/FLU/RSV plus assay is intended as an aid in the diagnosis of influenza from Nasopharyngeal swab specimens and should not be used as a sole basis for treatment. Nasal washings and aspirates are unacceptable for Xpert Xpress SARS-CoV-2/FLU/RSV testing.  Fact Sheet for Patients: EntrepreneurPulse.com.au  Fact Sheet for Healthcare Providers: IncredibleEmployment.be  This test is not yet approved or cleared by the [place] FDA and has been authorized for detection and/or diagnosis of SARS-CoV-2 by FDA under an Emergency Use Authorization (EUA). This EUA will remain in effect (meaning this test can be used) for the duration of the COVID-19 declaration under Section 564(b)(1) of the Act, 21 U.S.C. section 360bbb-3(b)(1), unless the authorization is terminated or revoked.  Performed at [facility], [address] [place], [place] [address]      Total time spend on discharging this patient, including the last patient exam, discussing the hospital stay, instructions for ongoing care as it relates to all pertinent caregivers, as well as preparing the medical discharge records, prescriptions, and/or referrals as applicable, is 35 minutes.    Enzo Bi, MD  [facility] 09/24/2020, 10:06 AM

## 2020-09-24 NOTE — Plan of Care (Signed)
  Problem: Education: Goal: Knowledge of General Education information will improve Description: Including pain rating scale, medication(s)/side effects and non-pharmacologic comfort measures 09/24/2020 1134 by Jules Schick, RN Outcome: Completed/Met 09/24/2020 0900 by Jules Schick, RN Outcome: Progressing   Problem: Health Behavior/Discharge Planning: Goal: Ability to manage health-related needs will improve 09/24/2020 1134 by Jules Schick, RN Outcome: Completed/Met 09/24/2020 0900 by Jules Schick, RN Outcome: Progressing   Problem: Clinical Measurements: Goal: Ability to maintain clinical measurements within normal limits will improve 09/24/2020 1134 by Jules Schick, RN Outcome: Completed/Met 09/24/2020 0900 by Jules Schick, RN Outcome: Progressing Goal: Will remain free from infection 09/24/2020 1134 by Jules Schick, RN Outcome: Completed/Met 09/24/2020 0900 by Jules Schick, RN Outcome: Progressing Goal: Diagnostic test results will improve 09/24/2020 1134 by Jules Schick, RN Outcome: Completed/Met 09/24/2020 0900 by Jules Schick, RN Outcome: Progressing Goal: Respiratory complications will improve 09/24/2020 1134 by Jules Schick, RN Outcome: Completed/Met 09/24/2020 0900 by Jules Schick, RN Outcome: Progressing Goal: Cardiovascular complication will be avoided 09/24/2020 1134 by Jules Schick, RN Outcome: Completed/Met 09/24/2020 0900 by Jules Schick, RN Outcome: Progressing   Problem: Activity: Goal: Risk for activity intolerance will decrease 09/24/2020 1134 by Jules Schick, RN Outcome: Completed/Met 09/24/2020 0900 by Jules Schick, RN Outcome: Progressing   Problem: Nutrition: Goal: Adequate nutrition will be maintained 09/24/2020 1134 by Jules Schick, RN Outcome: Completed/Met 09/24/2020 0900 by Jules Schick, RN Outcome: Progressing   Problem: Coping: Goal: Level of anxiety will  decrease 09/24/2020 1134 by Jules Schick, RN Outcome: Completed/Met 09/24/2020 0900 by Jules Schick, RN Outcome: Progressing   Problem: Elimination: Goal: Will not experience complications related to bowel motility 09/24/2020 1134 by Jules Schick, RN Outcome: Completed/Met 09/24/2020 0900 by Jules Schick, RN Outcome: Progressing Goal: Will not experience complications related to urinary retention 09/24/2020 1134 by Jules Schick, RN Outcome: Completed/Met 09/24/2020 0900 by Jules Schick, RN Outcome: Progressing   Problem: Pain Managment: Goal: General experience of comfort will improve 09/24/2020 1134 by Jules Schick, RN Outcome: Completed/Met 09/24/2020 0900 by Jules Schick, RN Outcome: Progressing   Problem: Safety: Goal: Ability to remain free from injury will improve 09/24/2020 1134 by Jules Schick, RN Outcome: Completed/Met 09/24/2020 0900 by Jules Schick, RN Outcome: Progressing   Problem: Skin Integrity: Goal: Risk for impaired skin integrity will decrease 09/24/2020 1134 by Jules Schick, RN Outcome: Completed/Met 09/24/2020 0900 by Jules Schick, RN Outcome: Progressing

## 2020-10-06 ENCOUNTER — Other Ambulatory Visit: Payer: Self-pay | Admitting: Radiation Oncology

## 2020-11-16 NOTE — Progress Notes (Signed)
Patient ID: John Frank, male    DOB: August 29, 1955, 65 y.o.   MRN: [id number]   John Frank is a 65 y/o male with a history of CAD, DM, hyperlipidemia, HTN, GERD, COPD, obstructive sleep apnea, chronic heart failure and current tobacco use.   Echo report from 09/23/20 reviewed and showed an EF of <20% along with mild/ moderate John. Echo report from 11/19/2018 reviewed and showed an EF of 40-45%. Echo report from 04/13/17 reviewed and showed an EF of 30% along with mild John/TR.  Admitted 09/22/20 due to shortness of breath and weakness due to COPD exacerbation and influenza. Pulmonology consult obtained. Initially needed oxygen due to hypoxia but then able to be weaned off of it. Given tamiflu, steroids and nebulizer treatment. Discharged after 2 days. Was in the ED 09/10/20 due to COPD exacerbation where he was treated and released. Was in the ED 08/27/20 due to COPD/ HF exacerbation. Says that he's been without diuretic for several months but he doesn't know why. Given IV lasix, solu-medrol and antibiotics. Admission recommended but patient refused and he was released. Was in the ED 08/01/20 due to abdominal pain. Evaluated and released.    He presents today for a follow-up visit with a chief complaint of minimal shortness of breath upon moderate exertion. He describes this as chronic in nature having been present for several years. He has associated cough, dizziness, easy bruising and weight loss along with this. He denies any difficulty sleeping (except when coughing), headaches, abdominal distention, palpitations, pedal edema, chest pain, wheezing or fatigue.   Now getting his medications pill packed through his pharmacy. Getting meals on wheels delivered daily M-F.   Past Medical History:  Diagnosis Date   AICD (automatic cardioverter/defibrillator) present    Angina at rest [facility])    Aortic atherosclerosis ([place])    CAD (coronary artery disease)    s/p stents in 3545   Chronic systolic CHF (congestive  heart failure) (HCC)    Cirrhosis of liver (HCC)    COPD (chronic obstructive pulmonary disease) (HCC)    Diabetes (HCC)    GERD (gastroesophageal reflux disease)    Heart attack ([place])    Hyperlipidemia    Hypertension    Ischemic cardiomyopathy    Paroxysmal ventricular tachycardia (HCC)    Right lower lobe lung mass    Shortness of breath dyspnea    Sleep apnea    Past Surgical History:  Procedure Laterality Date   CARDIAC PACEMAKER PLACEMENT     cardiac stents     CARDIAC SURGERY     defibrillator and pacemaker   CORONARY ANGIOPLASTY     ESOPHAGOGASTRODUODENOSCOPY N/A 09/19/2014   Procedure: ESOPHAGOGASTRODUODENOSCOPY (EGD);  Surgeon: Lucilla Lame, MD;  Location: [facility] ENDOSCOPY;  Service: Endoscopy;  Laterality: N/A;   ESOPHAGOGASTRODUODENOSCOPY (EGD) WITH PROPOFOL N/A 09/28/2017   Procedure: ESOPHAGOGASTRODUODENOSCOPY (EGD) WITH PROPOFOL;  Surgeon: Virgel Manifold, MD;  Location: [facility] ENDOSCOPY;  Service: Endoscopy;  Laterality: N/A;   ESOPHAGOGASTRODUODENOSCOPY (EGD) WITH PROPOFOL N/A 04/19/2018   Procedure: ESOPHAGOGASTRODUODENOSCOPY (EGD) WITH PROPOFOL with Gastric Mapping;  Surgeon: [name], MD;  Location: [facility] ENDOSCOPY;  Service: Gastroenterology;  Laterality: N/A;   ESOPHAGOGASTRODUODENOSCOPY (EGD) WITH PROPOFOL N/A 12/13/2018   Procedure: ESOPHAGOGASTRODUODENOSCOPY (EGD) WITH PROPOFOL;  Surgeon: [name], MD;  Location[facility] ENDOSCOPY;  Service: Gastroenterology;  Laterality: N/A;   ESOPHAGOGASTRODUODENOSCOPY (EGD) WITH PROPOFOL N/A 03/31/2019   Procedure: ESOPHAGOGASTRODUODENOSCOPY (EGD) WITH PROPOFOL;  Surgeon: Lin Landsman, MD;  Location: [facility] ENDOSCOPY;  Service: Gastroenterology;  Laterality:  N/A;   IMPLANTABLE CARDIOVERTER DEFIBRILLATOR (ICD) GENERATOR CHANGE Left 02/21/2020   Procedure: ICD GENERATOR CHANGE;  Surgeon: Isaias Cowman, MD;  Location: [facility] ORS;  Service: Cardiovascular;  Laterality: Left;   VIDEO BRONCHOSCOPY WITH ENDOBRONCHIAL  NAVIGATION N/A 06/22/2020   Procedure: VIDEO BRONCHOSCOPY WITH ENDOBRONCHIAL NAVIGATION;  Surgeon: Ottie Glazier, MD;  Location: [facility] ORS;  Service: Thoracic;  Laterality: N/A;   VIDEO BRONCHOSCOPY WITH ENDOBRONCHIAL ULTRASOUND N/A 06/22/2020   Procedure: VIDEO BRONCHOSCOPY WITH ENDOBRONCHIAL ULTRASOUND;  Surgeon: Ottie Glazier, MD;  Location: [facility] ORS;  Service: Thoracic;  Laterality: N/A;   Family History  Problem Relation Age of Onset   Diabetes Mother    Heart attack Father 29   Social History   Tobacco Use   Smoking status: Every Day    Packs/day: 0.25    Years: 40.00    Pack years: 10.00    Types: Cigarettes   Smokeless tobacco: Never  Substance Use Topics   Alcohol use: No   No Known Allergies  Prior to Admission medications   Medication Sig Start Date End Date Taking? Authorizing Provider  aspirin 81 MG EC tablet Take 81 mg by mouth daily.   Yes [provider]  atorvastatin (LIPITOR) 40 MG tablet TAKE 1 TABLET (40 MG TOTAL) BY MOUTH DAILY. Patient taking differently: Take 40 mg by mouth daily. 03/08/18  Yes Cannady, Jolene T, NP  Blood Glucose Monitoring Suppl (ACCU-CHEK GUIDE ME) w/Device KIT  02/23/20  Yes [provider]  budesonide-formoterol (SYMBICORT) 160-4.5 MCG/ACT inhaler Inhale 2 puffs into the lungs 2 (two) times daily.   Yes [provider]  buPROPion (WELLBUTRIN) 75 MG tablet Take 75 mg by mouth daily. 03/26/16 02/14/21 Yes [provider]  clopidogrel (PLAVIX) 75 MG tablet TAKE 1 TABLET (75 MG TOTAL) BY MOUTH DAILY. 12/08/16  Yes Kathrine Haddock, NP  empagliflozin (JARDIANCE) 10 MG TABS tablet Take 10 mg by mouth daily with breakfast.   Yes [provider]  ENTRESTO 24-26 MG Take 1 tablet by mouth 2 (two) times daily. 10/29/20  Yes [provider]  furosemide (LASIX) 40 MG tablet Take 40 mg by mouth 2 (two) times daily. 10/29/20  Yes [provider]  glimepiride (AMARYL) 4 MG tablet Take 4 mg by  mouth daily with breakfast. 02/23/20 02/22/21 Yes [provider]  ipratropium-albuterol (DUONEB) 0.5-2.5 (3) MG/3ML SOLN Take 3 mLs by nebulization in the morning, at noon, and at bedtime.   Yes [provider]  levocetirizine (XYZAL) 5 MG tablet Take 5 mg by mouth every evening. 11/10/18  Yes [provider]  metFORMIN (GLUCOPHAGE) 1000 MG tablet TAKE 1 TABLET (1,000 MG TOTAL) BY MOUTH 2 (TWO) TIMES DAILY WITH A MEAL. Patient taking differently: Take 1,000 mg by mouth in the morning and at bedtime. 09/12/16  Yes Kathrine Haddock, NP  metoprolol succinate (TOPROL-XL) 100 MG 24 hr tablet TAKE 1 TABLET BY MOUTH EVERY DAY 07/15/20  Yes Darylene Price A, FNP  nitroGLYCERIN (NITROSTAT) 0.4 MG SL tablet Place 0.4 mg under the tongue every 5 (five) minutes as needed for chest pain.   Yes [provider]  ONE TOUCH ULTRA TEST test strip USE TO TEST BLOOD SUGAR ONCE DAILY 10/07/16  Yes Johnson, Megan P, DO  pantoprazole (PROTONIX) 40 MG tablet Take 1 tablet (40 mg total) by mouth 2 (two) times daily before a meal. 12/13/18 02/14/21 Yes Vanga, Tally Due, MD  spironolactone (ALDACTONE) 25 MG tablet TAKE 1/2 TABLET BY MOUTH EVERY DAY Patient taking differently: Take  12.5 mg by mouth daily. 05/02/19  Yes Kagan Hietpas, Otila Kluver A, FNP  sucralfate (CARAFATE) 1 g tablet Take 1 g by mouth 4 (four) times daily. 05/15/17 02/14/21 Yes [provider]  TRULICITY 1.5 IW/5.8KD SOPN Inject 0.5 mg into the skin every Sunday. 08/30/19  Yes [provider]  ondansetron (ZOFRAN ODT) 4 MG disintegrating tablet Take 1 tablet (4 mg total) by mouth every 8 (eight) hours as needed. 09/07/19   Rudene Re, MD  traMADol (ULTRAM) 50 MG tablet Take 50 mg by mouth 2 (two) times daily as needed. 07/20/20   [provider]   Review of Systems  Constitutional:  Negative for appetite change, fatigue and fever.  HENT:  Negative for congestion, postnasal drip and sore throat.   Eyes: Negative.    Respiratory:  Positive for cough and shortness of breath. Negative for chest tightness and wheezing.   Cardiovascular:  Negative for chest pain, palpitations and leg swelling.  Gastrointestinal:  Negative for abdominal distention and abdominal pain.  Endocrine: Negative.   Genitourinary: Negative.   Musculoskeletal:  Positive for back pain. Negative for neck pain.  Skin: Negative.   Allergic/Immunologic: Negative.   Neurological:  Positive for dizziness. Negative for light-headedness and headaches.  Hematological:  Negative for adenopathy. Bruises/bleeds easily.  Psychiatric/Behavioral:  Negative for dysphoric mood and sleep disturbance ( sleeping on 2 pillows). The patient is not nervous/anxious.    Vitals:   11/19/20 0958  BP: 100/60  Pulse: 66  Resp: 18  SpO2: 91%  Weight: 122 lb 2 oz (55.4 kg)  Height: '5\' 4"'  (1.626 m)   Wt Readings from Last 3 Encounters:  11/19/20 122 lb 2 oz (55.4 kg)  09/22/20 135 lb (61.2 kg)  09/18/20 131 lb 8 oz (59.6 kg)   Lab Results  Component Value Date   CREATININE 0.83 09/24/2020   CREATININE 0.68 09/23/2020   CREATININE 0.69 09/22/2020    Physical Exam Vitals reviewed. Exam conducted with a chaperone present (sister).  Constitutional:      Appearance: Normal appearance. He is well-developed.  HENT:     Head: Normocephalic and atraumatic.  Neck:     Vascular: No JVD.  Cardiovascular:     Rate and Rhythm: Normal rate and regular rhythm.  Pulmonary:     Effort: Pulmonary effort is normal. No respiratory distress.     Breath sounds: No wheezing, rhonchi or rales.  Abdominal:     General: There is no distension.     Palpations: Abdomen is soft.     Tenderness: There is no abdominal tenderness.  Musculoskeletal:        General: No tenderness.     Cervical back: Normal range of motion and neck supple.     Right lower leg: No edema.     Left lower leg: No edema.  Skin:    General: Skin is warm and dry.  Neurological:     General:  No focal deficit present.     Mental Status: He is alert and oriented to person, place, and time.  Psychiatric:        Mood and Affect: Mood normal.        Behavior: Behavior normal.        Thought Content: Thought content normal.   Assessment & Plan:  1: Chronic heart failure with reduced ejection fraction- - NYHA class II - euvolemic today - weighing daily; reminded to call for an overnight weight gain of >2 pounds or a weekly weight gain of >5  pounds.  - weight down 9 pounds since last visit 2 months ago; says that he's eating "fine" - not adding salt; using [employer]; getting meals on wheels delivered daily M-F and usually eats sandwiches the other meals - saw cardiology Clayborn Bigness) 11/15/20; return 12/19/20 - BNP 08/27/20 was 822.9 - currently has pacemaker/defibrillator - on GDMT of entresto, jardiance, metoprolol and spironolactone - BP will not allow for titration  2: HTN- - BP on the low side (100/60) - saw PCP ([place]) 10/10/20  - BMP 10/03/20 reviewed and showed sodium 138, potassium 4.9, creatinine 0.7 and GFR 113  3: Diabetes- - saw endocrinology Honor Junes) 10/10/20 - A1c 10/03/20 was 9.9% - says that his home glucose runs in the 130's  4: COPD/ Tobacco use- - saw pulmonologist Raul Del) 08/17/20; returns 12/21/20 - currently smoking 4 cigarettes daily - saw radiation oncologist ([place]) 08/29/20; has upcoming CT in July   Medication packs were reviewed.   Return in 5 months or sooner for any questions/problems before then.

## 2020-11-19 ENCOUNTER — Other Ambulatory Visit: Payer: Self-pay

## 2020-11-19 ENCOUNTER — Ambulatory Visit: Payer: Medicare Other | Attending: Family | Admitting: Family

## 2020-11-19 ENCOUNTER — Encounter: Payer: Self-pay | Admitting: Family

## 2020-11-19 VITALS — BP 100/60 | HR 66 | Resp 18 | Ht 64.0 in | Wt 122.1 lb

## 2020-11-19 DIAGNOSIS — I255 Ischemic cardiomyopathy: Secondary | ICD-10-CM | POA: Diagnosis not present

## 2020-11-19 DIAGNOSIS — F17210 Nicotine dependence, cigarettes, uncomplicated: Secondary | ICD-10-CM | POA: Diagnosis not present

## 2020-11-19 DIAGNOSIS — Z7951 Long term (current) use of inhaled steroids: Secondary | ICD-10-CM | POA: Insufficient documentation

## 2020-11-19 DIAGNOSIS — Z95810 Presence of automatic (implantable) cardiac defibrillator: Secondary | ICD-10-CM | POA: Diagnosis not present

## 2020-11-19 DIAGNOSIS — E785 Hyperlipidemia, unspecified: Secondary | ICD-10-CM | POA: Insufficient documentation

## 2020-11-19 DIAGNOSIS — I5022 Chronic systolic (congestive) heart failure: Secondary | ICD-10-CM | POA: Diagnosis not present

## 2020-11-19 DIAGNOSIS — I700 Atherosclerosis of aorta: Secondary | ICD-10-CM | POA: Diagnosis not present

## 2020-11-19 DIAGNOSIS — E119 Type 2 diabetes mellitus without complications: Secondary | ICD-10-CM

## 2020-11-19 DIAGNOSIS — J449 Chronic obstructive pulmonary disease, unspecified: Secondary | ICD-10-CM

## 2020-11-19 DIAGNOSIS — I252 Old myocardial infarction: Secondary | ICD-10-CM | POA: Insufficient documentation

## 2020-11-19 DIAGNOSIS — I2510 Atherosclerotic heart disease of native coronary artery without angina pectoris: Secondary | ICD-10-CM | POA: Insufficient documentation

## 2020-11-19 DIAGNOSIS — I110 Hypertensive heart disease with heart failure: Secondary | ICD-10-CM | POA: Insufficient documentation

## 2020-11-19 DIAGNOSIS — Z7902 Long term (current) use of antithrombotics/antiplatelets: Secondary | ICD-10-CM | POA: Insufficient documentation

## 2020-11-19 DIAGNOSIS — Z955 Presence of coronary angioplasty implant and graft: Secondary | ICD-10-CM | POA: Diagnosis not present

## 2020-11-19 DIAGNOSIS — Z8249 Family history of ischemic heart disease and other diseases of the circulatory system: Secondary | ICD-10-CM | POA: Diagnosis not present

## 2020-11-19 DIAGNOSIS — Z79899 Other long term (current) drug therapy: Secondary | ICD-10-CM | POA: Insufficient documentation

## 2020-11-19 DIAGNOSIS — Z7984 Long term (current) use of oral hypoglycemic drugs: Secondary | ICD-10-CM | POA: Insufficient documentation

## 2020-11-19 DIAGNOSIS — I10 Essential (primary) hypertension: Secondary | ICD-10-CM

## 2020-11-19 NOTE — Patient Instructions (Signed)
Continue weighing daily and call for an overnight weight gain of > 2 pounds or a weekly weight gain of >5 pounds. 

## 2020-11-29 ENCOUNTER — Ambulatory Visit
Admission: RE | Admit: 2020-11-29 | Discharge: 2020-11-29 | Disposition: A | Discharge status: Home / Self Care | Payer: Medicare Other | Source: Ambulatory Visit | Attending: Radiation Oncology | Admitting: Radiation Oncology

## 2020-11-29 ENCOUNTER — Other Ambulatory Visit: Payer: Self-pay

## 2020-11-29 ENCOUNTER — Ambulatory Visit: Payer: Medicare Other | Admitting: Radiation Oncology

## 2020-11-29 DIAGNOSIS — R911 Solitary pulmonary nodule: Secondary | ICD-10-CM | POA: Diagnosis present

## 2020-11-29 LAB — POCT I-STAT CREATININE: Creatinine, Ser: 1 mg/dL (ref 0.61–1.24)

## 2020-11-29 MED ORDER — IOHEXOL 300 MG/ML  SOLN
75.0000 mL | Freq: Once | INTRAMUSCULAR | Status: AC | PRN
  Administered 2020-11-29: 75 mL via INTRAVENOUS

## 2020-12-06 ENCOUNTER — Encounter: Payer: Self-pay | Admitting: Radiation Oncology

## 2020-12-06 ENCOUNTER — Ambulatory Visit
Admission: RE | Admit: 2020-12-06 | Discharge: 2020-12-06 | Disposition: A | Discharge status: Home / Self Care | Payer: Medicare Other | Source: Ambulatory Visit | Attending: Radiation Oncology | Admitting: Radiation Oncology

## 2020-12-06 ENCOUNTER — Other Ambulatory Visit: Payer: Self-pay

## 2020-12-06 VITALS — BP 90/63 | Temp 97.2°F | Resp 18 | Wt 125.9 lb

## 2020-12-06 DIAGNOSIS — R918 Other nonspecific abnormal finding of lung field: Secondary | ICD-10-CM

## 2020-12-06 DIAGNOSIS — Z923 Personal history of irradiation: Secondary | ICD-10-CM | POA: Diagnosis not present

## 2020-12-06 DIAGNOSIS — R911 Solitary pulmonary nodule: Secondary | ICD-10-CM

## 2020-12-06 NOTE — Progress Notes (Signed)
Radiation Oncology Follow up Note  Name: John Frank   Date:   12/06/2020 MRN:  [id number] DOB: 06-14-1955    This 65 y.o. male presents to the clinic today for 40-month follow-up status post SBRT to his.  Right lower lobe for presumed stage I non-small cell lung cancer  REFERRING PROVIDER: Idelle Crouch, MD  HPI: Patient is a 65 year old male now out 4 months having completed SBRT to his right lower lobe for presumed stage I non-small cell lung cancer seen today in routine follow-up he is doing well he specifically has cough hemoptysis or chest tightness.  He had a recent CT scan.  Showing stable to decreased size and treated nodule in the right lower lobe.  He does have several nodules on the right lower lobe new from prior exam although favoring infection.  He is asymptomatic as far as being afebrile.  He is specifically denies having COVID.  COMPLICATIONS OF TREATMENT: none  FOLLOW UP COMPLIANCE: keeps appointments   PHYSICAL EXAM:  BP 90/63 (BP Location: Left Arm, Patient Position: Sitting)   Temp (!) 97.2 F (36.2 C) (Tympanic)   Resp 18   Wt 125 lb 14.4 oz (57.1 kg)   BMI 21.61 kg/m  Well-developed well-nourished patient in NAD. HEENT reveals PERLA, EOMI, discs not visualized.  Oral cavity is clear. No oral mucosal lesions are identified. Neck is clear without evidence of cervical or supraclavicular adenopathy. Lungs are clear to A&P. Cardiac examination is essentially unremarkable with regular rate and rhythm without murmur rub or thrill. Abdomen is benign with no organomegaly or masses noted. Motor sensory and DTR levels are equal and symmetric in the upper and lower extremities. Cranial nerves II through XII are grossly intact. Proprioception is intact. No peripheral adenopathy or edema is identified. No motor or sensory levels are noted. Crude visual fields are within normal range.  RADIOLOGY RESULTS: CT scan reviewed compatible with above-stated findings  PLAN: Present  time patient is doing well with excellent response and low side effect profile with SBRT.  On pleased with his overall progress.  I have asked to see him back in 6 months for follow-up with a CT scan of the chest at that time.  Patient knows to call with any concerns.  I would like to take this opportunity to thank you for allowing me to participate in the care of your patient.Noreene Filbert, MD

## 2020-12-20 ENCOUNTER — Ambulatory Visit (INDEPENDENT_AMBULATORY_CARE_PROVIDER_SITE_OTHER): Payer: Medicare Other | Admitting: Podiatry

## 2020-12-20 ENCOUNTER — Other Ambulatory Visit: Payer: Self-pay

## 2020-12-20 ENCOUNTER — Encounter: Payer: Self-pay | Admitting: Podiatry

## 2020-12-20 DIAGNOSIS — M79674 Pain in right toe(s): Secondary | ICD-10-CM | POA: Diagnosis not present

## 2020-12-20 DIAGNOSIS — M79675 Pain in left toe(s): Secondary | ICD-10-CM

## 2020-12-20 DIAGNOSIS — B351 Tinea unguium: Secondary | ICD-10-CM

## 2020-12-20 DIAGNOSIS — E119 Type 2 diabetes mellitus without complications: Secondary | ICD-10-CM

## 2020-12-20 NOTE — Progress Notes (Signed)
This patient returns to my office for at risk foot care.  This patient requires this care by a professional since this patient will be at risk due to having diabetes and coagulation defect.  Patient is taking plavix.  Patient has not been seen for years.  This patient is unable to cut nails himself since the patient cannot reach his nails.These nails are painful walking and wearing shoes.  This patient presents for at risk foot care today.  General Appearance  Alert, conversant and in no acute stress.  Vascular  Dorsalis pedis and posterior tibial  pulses are weakly  palpable  bilaterally.  Capillary return is within normal limits  bilaterally. Cold feet  Bilaterally.  Absent digital hair.  Neurologic  Senn-Weinstein monofilament wire test within normal limits  bilaterally. Muscle power within normal limits bilaterally.  Nails Thick disfigured discolored nails with subungual debris  from hallux to fifth toes bilaterally. No evidence of bacterial infection or drainage bilaterally. Healing fourth toenails left foot.  Orthopedic  No limitations of motion  feet .  No crepitus or effusions noted.  No bony pathology or digital deformities noted.  Skin  normotropic skin with no porokeratosis noted bilaterally.  No signs of infections or ulcers noted.     Onychomycosis  Pain in right toes  Pain in left toes  Consent was obtained for treatment procedures.   Mechanical debridement of nails 1-5  bilaterally performed with a nail nipper.  Filed with dremel without incident.    Return office visit   3 months                  Told patient to return for periodic foot care and evaluation due to potential at risk complications.   Gardiner Barefoot DPM

## 2021-03-04 ENCOUNTER — Ambulatory Visit (INDEPENDENT_AMBULATORY_CARE_PROVIDER_SITE_OTHER): Payer: Medicare Other | Admitting: Podiatry

## 2021-03-04 ENCOUNTER — Other Ambulatory Visit: Payer: Self-pay

## 2021-03-04 ENCOUNTER — Encounter: Payer: Self-pay | Admitting: Podiatry

## 2021-03-04 DIAGNOSIS — M79674 Pain in right toe(s): Secondary | ICD-10-CM | POA: Diagnosis not present

## 2021-03-04 DIAGNOSIS — M79675 Pain in left toe(s): Secondary | ICD-10-CM | POA: Diagnosis not present

## 2021-03-04 DIAGNOSIS — B351 Tinea unguium: Secondary | ICD-10-CM | POA: Diagnosis not present

## 2021-03-04 DIAGNOSIS — E119 Type 2 diabetes mellitus without complications: Secondary | ICD-10-CM

## 2021-03-04 NOTE — Progress Notes (Signed)
This patient returns to my office for at risk foot care.  This patient requires this care by a professional since this patient will be at risk due to having diabetes and coagulation defect.  Patient is taking plavix.  Patient has not been seen for years.  This patient is unable to cut nails himself since the patient cannot reach his nails.These nails are painful walking and wearing shoes.  This patient presents for at risk foot care today.  General Appearance  Alert, conversant and in no acute stress.  Vascular  Dorsalis pedis and posterior tibial  pulses are weakly  palpable  bilaterally.  Capillary return is within normal limits  bilaterally. Cold feet  Bilaterally.  Absent digital hair.  Neurologic  Senn-Weinstein monofilament wire test within normal limits  bilaterally. Muscle power within normal limits bilaterally.  Nails Thick disfigured discolored nails with subungual debris  from hallux to fifth toes bilaterally. No evidence of bacterial infection or drainage bilaterally. Healing fourth toenails left foot.  Orthopedic  No limitations of motion  feet .  No crepitus or effusions noted.  No bony pathology or digital deformities noted.  Skin  normotropic skin with no porokeratosis noted bilaterally.  No signs of infections or ulcers noted.     Onychomycosis  Pain in right toes  Pain in left toes  Consent was obtained for treatment procedures.   Mechanical debridement of nails 1-5  bilaterally performed with a nail nipper.  Filed with dremel without incident.    Return office visit   3 months                  Told patient to return for periodic foot care and evaluation due to potential at risk complications.   Gardiner Barefoot DPM

## 2021-03-22 ENCOUNTER — Observation Stay
Admission: EM | Admit: 2021-03-22 | Discharge: 2021-03-24 | Disposition: A | Discharge status: Home / Self Care | Payer: Medicare Other | Attending: Emergency Medicine | Admitting: Emergency Medicine

## 2021-03-22 ENCOUNTER — Emergency Department: Payer: Medicare Other

## 2021-03-22 ENCOUNTER — Other Ambulatory Visit: Payer: Self-pay

## 2021-03-22 DIAGNOSIS — E119 Type 2 diabetes mellitus without complications: Secondary | ICD-10-CM | POA: Insufficient documentation

## 2021-03-22 DIAGNOSIS — Z20822 Contact with and (suspected) exposure to covid-19: Secondary | ICD-10-CM | POA: Diagnosis not present

## 2021-03-22 DIAGNOSIS — J449 Chronic obstructive pulmonary disease, unspecified: Secondary | ICD-10-CM | POA: Diagnosis not present

## 2021-03-22 DIAGNOSIS — E039 Hypothyroidism, unspecified: Secondary | ICD-10-CM | POA: Diagnosis not present

## 2021-03-22 DIAGNOSIS — I110 Hypertensive heart disease with heart failure: Secondary | ICD-10-CM | POA: Insufficient documentation

## 2021-03-22 DIAGNOSIS — I5042 Chronic combined systolic (congestive) and diastolic (congestive) heart failure: Secondary | ICD-10-CM | POA: Diagnosis not present

## 2021-03-22 DIAGNOSIS — Z95810 Presence of automatic (implantable) cardiac defibrillator: Secondary | ICD-10-CM | POA: Diagnosis not present

## 2021-03-22 DIAGNOSIS — Z7984 Long term (current) use of oral hypoglycemic drugs: Secondary | ICD-10-CM | POA: Diagnosis not present

## 2021-03-22 DIAGNOSIS — R55 Syncope and collapse: Secondary | ICD-10-CM | POA: Insufficient documentation

## 2021-03-22 DIAGNOSIS — Z79899 Other long term (current) drug therapy: Secondary | ICD-10-CM | POA: Diagnosis not present

## 2021-03-22 DIAGNOSIS — F17210 Nicotine dependence, cigarettes, uncomplicated: Secondary | ICD-10-CM | POA: Diagnosis not present

## 2021-03-22 DIAGNOSIS — R0600 Dyspnea, unspecified: Secondary | ICD-10-CM | POA: Diagnosis present

## 2021-03-22 DIAGNOSIS — I959 Hypotension, unspecified: Secondary | ICD-10-CM

## 2021-03-22 DIAGNOSIS — R0902 Hypoxemia: Secondary | ICD-10-CM

## 2021-03-22 DIAGNOSIS — C7931 Secondary malignant neoplasm of brain: Secondary | ICD-10-CM

## 2021-03-22 DIAGNOSIS — I5022 Chronic systolic (congestive) heart failure: Secondary | ICD-10-CM

## 2021-03-22 DIAGNOSIS — R42 Dizziness and giddiness: Secondary | ICD-10-CM | POA: Diagnosis present

## 2021-03-22 DIAGNOSIS — Z7902 Long term (current) use of antithrombotics/antiplatelets: Secondary | ICD-10-CM | POA: Insufficient documentation

## 2021-03-22 LAB — HEPATIC FUNCTION PANEL
ALT: 15 U/L (ref 0–44)
AST: 22 U/L (ref 15–41)
Albumin: 4.3 g/dL (ref 3.5–5.0)
Alkaline Phosphatase: 101 U/L (ref 38–126)
Bilirubin, Direct: <0.1 mg/dL (ref 0.0–0.2)
Total Bilirubin: 1.2 mg/dL (ref 0.3–1.2)
Total Protein: 7.6 g/dL (ref 6.5–8.1)

## 2021-03-22 LAB — CBC WITH DIFFERENTIAL/PLATELET
Abs Immature Granulocytes: 0.04 10*3/uL (ref 0.00–0.07)
Basophils Absolute: 0 10*3/uL (ref 0.0–0.1)
Basophils Relative: 1 %
Eosinophils Absolute: 0.3 10*3/uL (ref 0.0–0.5)
Eosinophils Relative: 5 %
HCT: 35.2 % — ABNORMAL LOW (ref 39.0–52.0)
Hemoglobin: 11.8 g/dL — ABNORMAL LOW (ref 13.0–17.0)
Immature Granulocytes: 1 %
Lymphocytes Relative: 13 %
Lymphs Abs: 0.8 10*3/uL (ref 0.7–4.0)
MCH: 33 pg (ref 26.0–34.0)
MCHC: 33.5 g/dL (ref 30.0–36.0)
MCV: 98.3 fL (ref 80.0–100.0)
Monocytes Absolute: 0.7 10*3/uL (ref 0.1–1.0)
Monocytes Relative: 12 %
Neutro Abs: 4 10*3/uL (ref 1.7–7.7)
Neutrophils Relative %: 68 %
Platelets: 279 10*3/uL (ref 150–400)
RBC: 3.58 MIL/uL — ABNORMAL LOW (ref 4.22–5.81)
RDW: 16.8 % — ABNORMAL HIGH (ref 11.5–15.5)
WBC: 5.9 10*3/uL (ref 4.0–10.5)
nRBC: 0 % (ref 0.0–0.2)

## 2021-03-22 LAB — URINALYSIS, ROUTINE W REFLEX MICROSCOPIC
Bacteria, UA: NONE SEEN
Bilirubin Urine: NEGATIVE
Glucose, UA: >=500 mg/dL — AB
Hgb urine dipstick: NEGATIVE
Ketones, ur: NEGATIVE mg/dL
Leukocytes,Ua: NEGATIVE
Nitrite: NEGATIVE
Protein, ur: NEGATIVE mg/dL
Specific Gravity, Urine: 1.011 (ref 1.005–1.030)
Squamous Epithelial / HPF: NONE SEEN (ref 0–5)
pH: 5 (ref 5.0–8.0)

## 2021-03-22 LAB — BASIC METABOLIC PANEL
Anion gap: 9 (ref 5–15)
BUN: 24 mg/dL — ABNORMAL HIGH (ref 8–23)
CO2: 24 mmol/L (ref 22–32)
Calcium: 8.9 mg/dL (ref 8.9–10.3)
Chloride: 102 mmol/L (ref 98–111)
Creatinine, Ser: 1.23 mg/dL (ref 0.61–1.24)
GFR, Estimated: >60 mL/min (ref >60)
Glucose, Bld: 120 mg/dL — ABNORMAL HIGH (ref 70–99)
Potassium: 4.4 mmol/L (ref 3.5–5.1)
Sodium: 135 mmol/L (ref 135–145)

## 2021-03-22 LAB — CBG MONITORING, ED
Glucose-Capillary: 137 mg/dL — ABNORMAL HIGH (ref 70–99)
Glucose-Capillary: 84 mg/dL (ref 70–99)

## 2021-03-22 LAB — CBC
HCT: 39.7 % (ref 39.0–52.0)
Hemoglobin: 13.2 g/dL (ref 13.0–17.0)
MCH: 32.5 pg (ref 26.0–34.0)
MCHC: 33.2 g/dL (ref 30.0–36.0)
MCV: 97.8 fL (ref 80.0–100.0)
Platelets: 331 10*3/uL (ref 150–400)
RBC: 4.06 MIL/uL — ABNORMAL LOW (ref 4.22–5.81)
RDW: 16.5 % — ABNORMAL HIGH (ref 11.5–15.5)
WBC: 7 10*3/uL (ref 4.0–10.5)
nRBC: 0.3 % — ABNORMAL HIGH (ref 0.0–0.2)

## 2021-03-22 LAB — RESP PANEL BY RT-PCR (FLU A&B, COVID) ARPGX2
Influenza A by PCR: NEGATIVE
Influenza B by PCR: NEGATIVE
SARS Coronavirus 2 by RT PCR: NEGATIVE

## 2021-03-22 LAB — CREATININE, SERUM
Creatinine, Ser: 1.12 mg/dL (ref 0.61–1.24)
GFR, Estimated: >60 mL/min (ref >60)

## 2021-03-22 LAB — TROPONIN I (HIGH SENSITIVITY)
Troponin I (High Sensitivity): 6 ng/L (ref <18)
Troponin I (High Sensitivity): 7 ng/L (ref <18)

## 2021-03-22 LAB — LACTIC ACID, PLASMA
Lactic Acid, Venous: 3 mmol/L (ref 0.5–1.9)
Lactic Acid, Venous: 2 mmol/L (ref 0.5–1.9)
Lactic Acid, Venous: 1.1 mmol/L (ref 0.5–1.9)

## 2021-03-22 LAB — D-DIMER, QUANTITATIVE: D-Dimer, Quant: 0.3 ug/mL-FEU (ref 0.00–0.50)

## 2021-03-22 LAB — BRAIN NATRIURETIC PEPTIDE: B Natriuretic Peptide: 113.4 pg/mL — ABNORMAL HIGH (ref 0.0–100.0)

## 2021-03-22 LAB — PROCALCITONIN: Procalcitonin: <0.1 ng/mL

## 2021-03-22 MED ORDER — SODIUM CHLORIDE 0.9 % IV BOLUS
1000.0000 mL | Freq: Once | INTRAVENOUS | Status: AC
  Administered 2021-03-22: 1000 mL via INTRAVENOUS

## 2021-03-22 MED ORDER — SODIUM CHLORIDE 0.9 % IV BOLUS
250.0000 mL | Freq: Once | INTRAVENOUS | Status: AC
  Administered 2021-03-22: 250 mL via INTRAVENOUS

## 2021-03-22 MED ORDER — ASPIRIN EC 81 MG PO TBEC
81.0000 mg | DELAYED_RELEASE_TABLET | Freq: Every day | ORAL | Status: DC
  Administered 2021-03-22 – 2021-03-24 (×3): 81 mg via ORAL
  Filled 2021-03-22 (×4): qty 1

## 2021-03-22 MED ORDER — IPRATROPIUM-ALBUTEROL 0.5-2.5 (3) MG/3ML IN SOLN
3.0000 mL | Freq: Four times a day (QID) | RESPIRATORY_TRACT | Status: DC
Start: 2021-03-22 — End: 2021-03-23
  Administered 2021-03-22 – 2021-03-23 (×4): 3 mL via RESPIRATORY_TRACT
  Filled 2021-03-22 (×4): qty 3

## 2021-03-22 MED ORDER — MOMETASONE FURO-FORMOTEROL FUM 200-5 MCG/ACT IN AERO
2.0000 | INHALATION_SPRAY | Freq: Two times a day (BID) | RESPIRATORY_TRACT | Status: DC
  Administered 2021-03-23 – 2021-03-24 (×3): 2 via RESPIRATORY_TRACT
  Filled 2021-03-22: qty 8.8

## 2021-03-22 MED ORDER — INSULIN ASPART 100 UNIT/ML IJ SOLN: 0.0000 [IU] | Freq: Every day | INTRAMUSCULAR | Status: DC

## 2021-03-22 MED ORDER — INSULIN ASPART 100 UNIT/ML IJ SOLN
0.0000 [IU] | Freq: Three times a day (TID) | INTRAMUSCULAR | Status: DC
Start: 2021-03-23 — End: 2021-03-24
  Administered 2021-03-23 – 2021-03-24 (×2): 1 [IU] via SUBCUTANEOUS
  Filled 2021-03-22 (×3): qty 1

## 2021-03-22 MED ORDER — ATORVASTATIN CALCIUM 20 MG PO TABS
40.0000 mg | ORAL_TABLET | Freq: Every day | ORAL | Status: DC
  Administered 2021-03-22 – 2021-03-24 (×3): 40 mg via ORAL
  Filled 2021-03-22 (×4): qty 2

## 2021-03-22 MED ORDER — ENOXAPARIN SODIUM 40 MG/0.4ML IJ SOSY
40.0000 mg | PREFILLED_SYRINGE | INTRAMUSCULAR | Status: DC
  Administered 2021-03-22 – 2021-03-23 (×2): 40 mg via SUBCUTANEOUS
  Filled 2021-03-22 (×2): qty 0.4

## 2021-03-22 MED ORDER — LACTATED RINGERS IV BOLUS
1000.0000 mL | Freq: Once | INTRAVENOUS | Status: AC
  Administered 2021-03-22: 1000 mL via INTRAVENOUS

## 2021-03-22 MED ORDER — IPRATROPIUM-ALBUTEROL 0.5-2.5 (3) MG/3ML IN SOLN
3.0000 mL | Freq: Once | RESPIRATORY_TRACT | Status: AC
  Administered 2021-03-22: 3 mL via RESPIRATORY_TRACT
  Filled 2021-03-22: qty 3

## 2021-03-22 NOTE — ED Notes (Signed)
Pt's niece, Philis Nettle would like to be called with update on room assignment: [phone]

## 2021-03-22 NOTE — ED Triage Notes (Signed)
Pt to ED POV for dizziness for the past few days. States has had a couple falls in the past few days because cannot keep balance. Denies hitting head or blood thinner use. Some lacerations noted to left knee, right arm, left hand.  Hx HTN, took bp meds today.  Denies cp or shob.   Sent from UC for hypoxia and hypotension

## 2021-03-22 NOTE — ED Notes (Signed)
Lilia Pro RN aware of assigned bed

## 2021-03-22 NOTE — ED Provider Notes (Signed)
-----------------------------------------   4:26 PM on 03/22/2021 ----------------------------------------- Patient care assumed from Dr. Rip Harbour.  Patient's work-up is overall nonrevealing.  Patient presents for 2 days of dizziness and multiple falls and possible syncopal episode.  Patient found to be fairly hypotensive despite IV fluids, has now received 2 L of IV fluids and most recent blood pressure 98/65.  Highly suspect the patient's hypotension to be the cause of his dizziness falls and possible syncopal episode.  Remainder of the work-up including lab work is largely reassuring.  CT scan head does show several calcifications in the right occipital parietal lobe however this is not entirely clear, patient has a pacemaker unfortunately we cannot proceed with MRI at this time.  However given the patient's hypotension dizziness syncope believe he would benefit from admission for cardiac monitoring and further work-up.   Harvest Dark, MD 03/22/21 1627

## 2021-03-22 NOTE — H&P (Addendum)
History and Physical  John Frank TGY:[id number] DOB: 1955-12-30 DOA: 03/22/2021  Referring physician: Dr. Kerman Passey, [place]  PCP: Idelle Crouch, MD  Outpatient Specialists: Cardiology, endocrinology, pulmonology, podiatry. Patient coming from: Home, lives alone.  Chief Complaint: Dizziness, 2 falls in the last 2 days, sent from [place] [facility].  HPI: John Frank is a 65 y.o. male with medical history significant for paroxysmal ventricular tachycardia, coronary artery disease, status post biventricular ICD, right coronary artery stent placement, COPD, OSA, essential hypertension, hyperlipidemia, ischemic cardiomyopathy, lung mass, recently diagnosed lung cancer on radiation therapy for inoperable lung cancer, ongoing tobacco user, who presented to [facility] ED from [place] [facility] due to dizziness associated with 2 falls in the last 2 days and hypotension with SBP in the 80's.  His falls were unwitnessed, unclear if the patient had loss of consciousness, he is not aware if he did or not.  Upon presentation to the ED, he is hypotensive with SBP 81, improved with IV fluid hydration.  Patient is on multiple oral antihypertensives for his underlying cardiac disease.  CT head done in the ED showed atrophy and chronic microvascular ischemia.  Negative for hemorrhage.  Interval development of several punctate calcifications in the right occipital parietal lobe of uncertain etiology.  No associated mass identified by CT.  This could be due to chronic infection, chronic ischemia or less likely tumor.  MRI brain without and with contrast recommended for further evaluation.  Due to concern for presyncope, EDP requested admission for further evaluation and management.  Admitted by hospitalist team.  ED Course:  Temperature 97.7.  BP 92/62, pulse 76, respiratory rate 23, with saturation 95% on room air.  Lab studies remarkable for serum glucose 120, BUN 24, creatinine 1.23, BNP 113.  Troponin 6, procalcitonin  less than 0.1.  Review of Systems: Review of systems as noted in the HPI. All other systems reviewed and are negative.   Past Medical History:  Diagnosis Date   AICD (automatic cardioverter/defibrillator) present    Angina at rest [facility])    Aortic atherosclerosis ([place])    CAD (coronary artery disease)    s/p stents in 2876   Chronic systolic CHF (congestive heart failure) (HCC)    Cirrhosis of liver (HCC)    COPD (chronic obstructive pulmonary disease) (HCC)    Diabetes (HCC)    GERD (gastroesophageal reflux disease)    Heart attack ([place])    Hyperlipidemia    Hypertension    Ischemic cardiomyopathy    Paroxysmal ventricular tachycardia    Right lower lobe lung mass    Shortness of breath dyspnea    Sleep apnea    Past Surgical History:  Procedure Laterality Date   CARDIAC PACEMAKER PLACEMENT     cardiac stents     CARDIAC SURGERY     defibrillator and pacemaker   CORONARY ANGIOPLASTY     ESOPHAGOGASTRODUODENOSCOPY N/A 09/19/2014   Procedure: ESOPHAGOGASTRODUODENOSCOPY (EGD);  Surgeon: Lucilla Lame, MD;  Location: [facility] ENDOSCOPY;  Service: Endoscopy;  Laterality: N/A;   ESOPHAGOGASTRODUODENOSCOPY (EGD) WITH PROPOFOL N/A 09/28/2017   Procedure: ESOPHAGOGASTRODUODENOSCOPY (EGD) WITH PROPOFOL;  Surgeon: Virgel Manifold, MD;  Location: [facility] ENDOSCOPY;  Service: Endoscopy;  Laterality: N/A;   ESOPHAGOGASTRODUODENOSCOPY (EGD) WITH PROPOFOL N/A 04/19/2018   Procedure: ESOPHAGOGASTRODUODENOSCOPY (EGD) WITH PROPOFOL with Gastric Mapping;  Surgeon: [name], MD;  Location: [facility] ENDOSCOPY;  Service: Gastroenterology;  Laterality: N/A;   ESOPHAGOGASTRODUODENOSCOPY (EGD) WITH PROPOFOL N/A 12/13/2018   Procedure: ESOPHAGOGASTRODUODENOSCOPY (EGD) WITH PROPOFOL;  Surgeon: [name], MD;  Location: [facility] ENDOSCOPY;  Service: Gastroenterology;  Laterality: N/A;   ESOPHAGOGASTRODUODENOSCOPY (EGD) WITH PROPOFOL N/A 03/31/2019   Procedure: ESOPHAGOGASTRODUODENOSCOPY (EGD) WITH PROPOFOL;   Surgeon: Lin Landsman, MD;  Location: [facility] ENDOSCOPY;  Service: Gastroenterology;  Laterality: N/A;   IMPLANTABLE CARDIOVERTER DEFIBRILLATOR (ICD) GENERATOR CHANGE Left 02/21/2020   Procedure: ICD GENERATOR CHANGE;  Surgeon: Isaias Cowman, MD;  Location: [facility] ORS;  Service: Cardiovascular;  Laterality: Left;   VIDEO BRONCHOSCOPY WITH ENDOBRONCHIAL NAVIGATION N/A 06/22/2020   Procedure: VIDEO BRONCHOSCOPY WITH ENDOBRONCHIAL NAVIGATION;  Surgeon: Ottie Glazier, MD;  Location: [facility] ORS;  Service: Thoracic;  Laterality: N/A;   VIDEO BRONCHOSCOPY WITH ENDOBRONCHIAL ULTRASOUND N/A 06/22/2020   Procedure: VIDEO BRONCHOSCOPY WITH ENDOBRONCHIAL ULTRASOUND;  Surgeon: Ottie Glazier, MD;  Location: [facility] ORS;  Service: Thoracic;  Laterality: N/A;    Social History:  reports that he has been smoking cigarettes. He has a 10.00 pack-year smoking history. He has never used smokeless tobacco. He reports that he does not drink alcohol and does not use drugs.   No Known Allergies  Family History  Problem Relation Age of Onset   Diabetes Mother    Heart attack Father 36      Prior to Admission medications   Medication Sig Start Date End Date Taking? Authorizing Provider  amLODipine (NORVASC) 5 MG tablet Take by mouth. 11/15/20 11/15/21  [provider]  aspirin 81 MG EC tablet Take 81 mg by mouth daily.    [provider]  atorvastatin (LIPITOR) 40 MG tablet Take by mouth. 10/26/20   [provider]  Blood Glucose Monitoring Suppl (ACCU-CHEK GUIDE ME) w/Device KIT  02/23/20   [provider]  budesonide-formoterol (SYMBICORT) 160-4.5 MCG/ACT inhaler Inhale 2 puffs into the lungs 2 (two) times daily.    [provider]  buPROPion (WELLBUTRIN) 75 MG tablet Take 75 mg by mouth daily. 03/26/16 02/14/21  [provider]  clopidogrel (PLAVIX) 75 MG tablet TAKE 1 TABLET (75 MG TOTAL) BY MOUTH DAILY. 12/08/16   Kathrine Haddock, NP  empagliflozin  (JARDIANCE) 10 MG TABS tablet Take 10 mg by mouth daily with breakfast.    [provider]  ENTRESTO 24-26 MG Take 1 tablet by mouth 2 (two) times daily. 10/29/20   [provider]  furosemide (LASIX) 40 MG tablet Take 40 mg by mouth 2 (two) times daily. 10/29/20   [provider]  ipratropium-albuterol (DUONEB) 0.5-2.5 (3) MG/3ML SOLN Take 3 mLs by nebulization in the morning, at noon, and at bedtime.    [provider]  isosorbide mononitrate (IMDUR) 30 MG 24 hr tablet Take by mouth. 11/15/20 11/15/21  [provider]  levocetirizine (XYZAL) 5 MG tablet Take 1 tablet by mouth every evening. 10/26/20   [provider]  metFORMIN (GLUCOPHAGE) 1000 MG tablet Take by mouth. 10/26/20   [provider]  metoprolol succinate (TOPROL-XL) 100 MG 24 hr tablet TAKE 1 TABLET BY MOUTH EVERY DAY 07/15/20   Darylene Price A, FNP  naproxen (NAPROSYN) 250 MG tablet Take 250 mg by mouth 2 (two) times daily. 11/15/20   [provider]  nitroGLYCERIN (NITROSTAT) 0.4 MG SL tablet Place under the tongue. 10/29/20   [provider]  ondansetron (ZOFRAN ODT) 4 MG disintegrating tablet Take 1 tablet (4 mg total) by mouth every 8 (eight) hours as needed. 09/07/19   Rudene Re, MD  ONE [facility] ULTRA TEST test strip USE TO TEST BLOOD SUGAR ONCE DAILY 10/07/16   Wynetta Emery, Megan P, DO  pantoprazole (PROTONIX) 40  MG tablet Take by mouth. 10/26/20   [provider]  spironolactone (ALDACTONE) 25 MG tablet TAKE 1/2 TABLET BY MOUTH EVERY DAY Patient taking differently: Take 12.5 mg by mouth daily. 05/02/19   Alisa Graff, FNP  sucralfate (CARAFATE) 1 g tablet Take 1 g by mouth 4 (four) times daily. 05/15/17 02/14/21  [provider]  traMADol (ULTRAM) 50 MG tablet Take 50 mg by mouth 2 (two) times daily as needed. 07/20/20   [provider]  TRULICITY 1.5 DV/7.6HY SOPN Inject 0.5 mg into the skin every Sunday. 08/30/19   [provider]    Physical Exam: BP 92/62   Pulse 82   Temp 97.7 F (36.5 C) (Oral)   Resp 16   Ht _0  (1.626 m)   Wt 59 kg   SpO2 93%   BMI 22.31 kg/m   General: 65 y.o. year-old male well developed well nourished in no acute distress.  Alert and oriented x3. Cardiovascular: Regular rate and rhythm with no rubs or gallops.  No thyromegaly or JVD noted.  No lower extremity edema. 2/4 pulses in all 4 extremities. Respiratory: Mild rales at bases with no wheezing noted. Good inspiratory effort. Abdomen: Soft nontender nondistended with normal bowel sounds x4 quadrants. Muskuloskeletal: No cyanosis, clubbing or edema noted bilaterally Neuro: CN II-XII intact, strength, sensation, reflexes Skin: No ulcerative lesions noted or rashes Psychiatry: Judgement and insight appear normal. Mood is appropriate for condition and setting          Labs on Admission:  Basic Metabolic Panel: Recent Labs  Lab 03/22/21 1338  NA 135  K 4.4  CL 102  CO2 24  GLUCOSE 120*  BUN 24*  CREATININE 1.23  CALCIUM 8.9   Liver Function Tests: Recent Labs  Lab 03/22/21 1430  AST 22  ALT 15  ALKPHOS 101  BILITOT 1.2  PROT 7.6  ALBUMIN 4.3   No results for input(s): LIPASE, AMYLASE in the last 168 hours. No results for input(s): AMMONIA in the last 168 hours. CBC: Recent Labs  Lab 03/22/21 1338  WBC 7.0  HGB 13.2  HCT 39.7  MCV 97.8  PLT 331   Cardiac Enzymes: No results for input(s): CKTOTAL, CKMB, CKMBINDEX, TROPONINI in the last 168 hours.  BNP (last 3 results) Recent Labs    08/27/20 1133 09/22/20 1357 03/22/21 1425  BNP 822.9* 2,195.9* 113.4*    ProBNP (last 3 results) No results for input(s): PROBNP in the last 8760 hours.  CBG: Recent Labs  Lab 03/22/21 1343  GLUCAP 137*    Radiological Exams on Admission: CT HEAD WO CONTRAST  Result Date: 03/22/2021 CLINICAL DATA:  Dizziness.  Recent falls EXAM: CT HEAD WITHOUT CONTRAST TECHNIQUE: Contiguous axial images  were obtained from the base of the skull through the vertex without intravenous contrast. COMPARISON:  CT head 05/25/2017 FINDINGS: Brain: Mild atrophy. Mild ventricular enlargement consistent with atrophy and stable. Mild white matter hypodensity bilaterally. Negative for acute hemorrhage Several punctate calcifications in the right occipital parietal lobe, not seen previously. No associated mass or infarct identified. Vascular: Negative for hyperdense vessel Skull: Negative Sinuses/Orbits: Negative Other: None IMPRESSION: Atrophy and chronic microvascular ischemia.  Negative for hemorrhage Interval development of several punctate calcifications in the right occipital parietal lobe of uncertain etiology. No associated mass identified by CT. This could be due to chronic infection, chronic ischemia, or less likely tumor. MRI brain without and with contrast recommended for further evaluation. Electronically Signed   By: Juanda Crumble  Carlis Abbott M.D.   On: 03/22/2021 15:22   DG Chest Portable 1 View  Result Date: 03/22/2021 CLINICAL DATA:  Weakness with dizziness for the past few days. EXAM: PORTABLE CHEST 1 VIEW COMPARISON:  Radiographs 09/22/2020 and 09/10/2020.  CT 11/29/2020. FINDINGS: 1432 hours. Patient rotation to the left. The left subclavian AICD leads are unchanged, although the tip of the ventricular lead is incompletely visualized. The heart size and mediastinal contours are stable. The lungs are clear. There is no pleural effusion or pneumothorax. Old rib fractures are present on the left. Telemetry leads overlie the chest. IMPRESSION: Radiographically stable appearance of the chest. No evidence of active cardiopulmonary process. Electronically Signed   By: Richardean Sale M.D.   On: 03/22/2021 14:43    EKG: I independently viewed the EKG done and my findings are as followed: Normal sinus rhythm rate of 84, nonspecific ST-T changes.  QTc 475.  Assessment/Plan Present on Admission: **None**  Active  Problems:   Postural dizziness with presyncope  Presyncope/symptomatic hypotension Abnormal CT head as stated above, personally reviewed with his niece in the room. Endorses dizziness prior to falling Hypotensive on presentation with SBP in the 80s, responded to IV fluid hydration. On multiple cardiac medications and oral antihypertensives. Hold off home oral antihypertensives for now, consulted cardiology via epic to assist with the adjustment of his cardiac medications Maintain MAP greater than 65. Closely monitor vital signs. Obtain orthostatic vital signs in the a.m. PT OT assessment in the a.m. Fall precautions.  Chronic combined diastolic and systolic CHF Last 2D echo done on 09/23/2020 showed LVEF less than 20% with global hypokinesis, grade 2 diastolic dysfunction BNP 574 Euvolemic on exam. Hold off diuretics for now due to symptomatic hypotension. Start strict I's and O's and daily weight  COPD, not on oxygen supplementation at baseline Resume home bronchodilators Maintain O2 saturation greater than 90%.  Recently diagnosed lung cancer, nonoperable, ongoing radiation Management per oncology, outpatient.  Tobacco use disorder Ongoing tobacco use Nicotine patch Needs tobacco cessation counseling.  Hypothyroidism Resume home levothyroxine  GERD Resume home PPI and sucralfate  Type 2 diabetes with hyperlipidemia Obtain hemoglobin A1c Start insulin sliding scale. Resume home Lipitor  History of hypertension, currently hypotensive Hold off home oral antihypertensives Management as stated above.  Abnormal CT head CT head done in the ED showed atrophy and chronic microvascular ischemia.  Negative for hemorrhage.  Interval development of several punctate calcifications in the right occipital parietal lobe of uncertain etiology.  No associated mass identified by CT.  This could be due to chronic infection, chronic ischemia or less likely tumor.  MRI brain without and  with contrast recommended for further evaluation however patient has a pacemaker which may not be compatible with MRI.  MRI brain ordered, pacemaker noted on this order. Neurology consulted to assist with her management.  OSA CPAP nightly    DVT prophylaxis: Subcu Lovenox daily  Code Status: Full code  Family Communication: His niece Tosha at bedside  Disposition Plan: Admit to telemetry cardiac unit  Consults called: Cardiology  Admission status: Inpatient status.   Status is: Inpatient status.  Patient will require at least 2 midnights for further evaluation and treatment of present condition.       Kayleen Memos MD [facility] Pager [phone]  If 7PM-7AM, please contact night-coverage [email or web address] Password [facility]  03/22/2021, 4:56 PM

## 2021-03-22 NOTE — Progress Notes (Signed)
Patient has pacemaker. Pacemakers can only undergo MRI scanning at [facility], per [facility] and Procedure Guidelines.

## 2021-03-22 NOTE — ED Notes (Signed)
Provider messaged regarding hypotension at this time.

## 2021-03-22 NOTE — ED Notes (Signed)
Pt has dry non productive cough.

## 2021-03-22 NOTE — ED Provider Notes (Signed)
[facility] Emergency Department Provider Note   ____________________________________________   Event Date/Time   First MD Initiated Contact with Patient 03/22/21 1423     (approximate)  I have reviewed the triage vital signs and the nursing notes.   HISTORY  Chief Complaint Dizziness   HPI John Frank is a 65 y.o. male who has been dizzy for the last few days and fall last couple times.  He denies hitting his head.  He does have a dry cough which he reports is productive of some green mucus.  Blood pressure out in the lobby was 85/55 but here is 161 systolic O2 sats about 94 on room air now.         Past Medical History:  Diagnosis Date   AICD (automatic cardioverter/defibrillator) present    Angina at rest [facility])    Aortic atherosclerosis ([place])    CAD (coronary artery disease)    s/p stents in 0960   Chronic systolic CHF (congestive heart failure) (HCC)    Cirrhosis of liver (HCC)    COPD (chronic obstructive pulmonary disease) (HCC)    Diabetes (HCC)    GERD (gastroesophageal reflux disease)    Heart attack ([place])    Hyperlipidemia    Hypertension    Ischemic cardiomyopathy    Paroxysmal ventricular tachycardia    Right lower lobe lung mass    Shortness of breath dyspnea    Sleep apnea     Patient Active Problem List   Diagnosis Date Noted   Acute respiratory failure ([place]) 09/22/2020   Influenza A 09/22/2020   Depression 09/22/2020   Right lower lobe lung mass 09/22/2020   Pain due to onychomycosis of toenails of both feet 03/15/2020   Ulcer of esophagus without bleeding    Hyperlipidemia associated with type 2 diabetes mellitus ([place]) 02/15/2019   Gastritis with intestinal metaplasia of stomach    COPD with acute exacerbation ([place]) 05/25/2018   Iron deficiency anemia    Cirrhosis of liver with ascites ([place])    Chronic systolic heart failure ([place]) 08/31/2017   CHF exacerbation ([place]) 08/09/2017   Syncope 01/11/2016   Tobacco  abuse 04/06/2015   HTN (hypertension) 11/03/2014   Sleep apnea 10/26/2014   CAD (coronary artery disease) 10/26/2014   GERD (gastroesophageal reflux disease) 10/26/2014   Polyp of colon 10/26/2014   Diabetes mellitus without complication ([place]) 45/40/9811   Hyperlipidemia 10/26/2014   COPD, severe ([place]) 10/26/2014   Status cardiac pacemaker 10/26/2014   Lesion of nasal septum 10/26/2014    Past Surgical History:  Procedure Laterality Date   CARDIAC PACEMAKER PLACEMENT     cardiac stents     CARDIAC SURGERY     defibrillator and pacemaker   CORONARY ANGIOPLASTY     ESOPHAGOGASTRODUODENOSCOPY N/A 09/19/2014   Procedure: ESOPHAGOGASTRODUODENOSCOPY (EGD);  Surgeon: Lucilla Lame, MD;  Location: [facility] ENDOSCOPY;  Service: Endoscopy;  Laterality: N/A;   ESOPHAGOGASTRODUODENOSCOPY (EGD) WITH PROPOFOL N/A 09/28/2017   Procedure: ESOPHAGOGASTRODUODENOSCOPY (EGD) WITH PROPOFOL;  Surgeon: Virgel Manifold, MD;  Location: [facility] ENDOSCOPY;  Service: Endoscopy;  Laterality: N/A;   ESOPHAGOGASTRODUODENOSCOPY (EGD) WITH PROPOFOL N/A 04/19/2018   Procedure: ESOPHAGOGASTRODUODENOSCOPY (EGD) WITH PROPOFOL with Gastric Mapping;  Surgeon: [name], MD;  Location: [facility] ENDOSCOPY;  Service: Gastroenterology;  Laterality: N/A;   ESOPHAGOGASTRODUODENOSCOPY (EGD) WITH PROPOFOL N/A 12/13/2018   Procedure: ESOPHAGOGASTRODUODENOSCOPY (EGD) WITH PROPOFOL;  Surgeon: [name], MD;  Location: [facility] ENDOSCOPY;  Service: Gastroenterology;  Laterality: N/A;   ESOPHAGOGASTRODUODENOSCOPY (EGD) WITH PROPOFOL N/A 03/31/2019  Procedure: ESOPHAGOGASTRODUODENOSCOPY (EGD) WITH PROPOFOL;  Surgeon: Lin Landsman, MD;  Location: [facility] ENDOSCOPY;  Service: Gastroenterology;  Laterality: N/A;   IMPLANTABLE CARDIOVERTER DEFIBRILLATOR (ICD) GENERATOR CHANGE Left 02/21/2020   Procedure: ICD GENERATOR CHANGE;  Surgeon: Isaias Cowman, MD;  Location: [facility] ORS;  Service: Cardiovascular;  Laterality: Left;   VIDEO BRONCHOSCOPY  WITH ENDOBRONCHIAL NAVIGATION N/A 06/22/2020   Procedure: VIDEO BRONCHOSCOPY WITH ENDOBRONCHIAL NAVIGATION;  Surgeon: Ottie Glazier, MD;  Location: [facility] ORS;  Service: Thoracic;  Laterality: N/A;   VIDEO BRONCHOSCOPY WITH ENDOBRONCHIAL ULTRASOUND N/A 06/22/2020   Procedure: VIDEO BRONCHOSCOPY WITH ENDOBRONCHIAL ULTRASOUND;  Surgeon: Ottie Glazier, MD;  Location: [facility] ORS;  Service: Thoracic;  Laterality: N/A;    Prior to Admission medications   Medication Sig Start Date End Date Taking? Authorizing Provider  amLODipine (NORVASC) 5 MG tablet Take by mouth. 11/15/20 11/15/21  [provider]  aspirin 81 MG EC tablet Take 81 mg by mouth daily.    [provider]  atorvastatin (LIPITOR) 40 MG tablet Take by mouth. 10/26/20   [provider]  Blood Glucose Monitoring Suppl (ACCU-CHEK GUIDE ME) w/Device KIT  02/23/20   [provider]  budesonide-formoterol (SYMBICORT) 160-4.5 MCG/ACT inhaler Inhale 2 puffs into the lungs 2 (two) times daily.    [provider]  buPROPion (WELLBUTRIN) 75 MG tablet Take 75 mg by mouth daily. 03/26/16 02/14/21  [provider]  clopidogrel (PLAVIX) 75 MG tablet TAKE 1 TABLET (75 MG TOTAL) BY MOUTH DAILY. 12/08/16   Kathrine Haddock, NP  empagliflozin (JARDIANCE) 10 MG TABS tablet Take 10 mg by mouth daily with breakfast.    [provider]  ENTRESTO 24-26 MG Take 1 tablet by mouth 2 (two) times daily. 10/29/20   [provider]  furosemide (LASIX) 40 MG tablet Take 40 mg by mouth 2 (two) times daily. 10/29/20   [provider]  ipratropium-albuterol (DUONEB) 0.5-2.5 (3) MG/3ML SOLN Take 3 mLs by nebulization in the morning, at noon, and at bedtime.    [provider]  isosorbide mononitrate (IMDUR) 30 MG 24 hr tablet Take by mouth. 11/15/20 11/15/21  [provider]  levocetirizine (XYZAL) 5 MG tablet Take 1 tablet by mouth every evening. 10/26/20   [provider]  metFORMIN  (GLUCOPHAGE) 1000 MG tablet Take by mouth. 10/26/20   [provider]  metoprolol succinate (TOPROL-XL) 100 MG 24 hr tablet TAKE 1 TABLET BY MOUTH EVERY DAY 07/15/20   Darylene Price A, FNP  naproxen (NAPROSYN) 250 MG tablet Take 250 mg by mouth 2 (two) times daily. 11/15/20   [provider]  nitroGLYCERIN (NITROSTAT) 0.4 MG SL tablet Place under the tongue. 10/29/20   [provider]  ondansetron (ZOFRAN ODT) 4 MG disintegrating tablet Take 1 tablet (4 mg total) by mouth every 8 (eight) hours as needed. 09/07/19   Rudene Re, MD  ONE [facility] ULTRA TEST test strip USE TO TEST BLOOD SUGAR ONCE DAILY 10/07/16   Wynetta Emery, Megan P, DO  pantoprazole (PROTONIX) 40 MG tablet Take by mouth. 10/26/20   [provider]  spironolactone (ALDACTONE) 25 MG tablet TAKE 1/2 TABLET BY MOUTH EVERY DAY Patient taking differently: Take 12.5 mg by mouth daily. 05/02/19   Alisa Graff, FNP  sucralfate (CARAFATE) 1 g tablet Take 1 g by mouth 4 (four) times daily. 05/15/17 02/14/21  [provider]  traMADol (ULTRAM) 50 MG tablet Take 50 mg by mouth 2 (two) times daily as needed. 07/20/20   [provider]  TRULICITY 1.5 XH/3.7JI SOPN Inject 0.5 mg into the skin every Sunday. 08/30/19   [provider]    Allergies Patient has no known allergies.  Family History  Problem Relation Age of Onset   Diabetes Mother    Heart attack Father 6    Social History Social History   Tobacco Use   Smoking status: Every Day    Packs/day: 0.25    Years: 40.00    Pack years: 10.00    Types: Cigarettes   Smokeless tobacco: Never  Vaping Use   Vaping Use: Never used  Substance Use Topics   Alcohol use: No   Drug use: Never    Review of Systems  Constitutional: No fever/chills Eyes: No visual changes. ENT: No sore throat. Cardiovascular: Denies chest pain. Respiratory: Denies shortness of breath. Gastrointestinal: No abdominal pain.  No nausea, no vomiting.   No diarrhea.  No constipation. Genitourinary: Negative for dysuria. Musculoskeletal: Negative for back pain. Skin: Negative for rash. Neurological: Very mild headache patient reports however negative focal weakness or numbness.  ____________________________________________   PHYSICAL EXAM:  VITAL SIGNS: ED Triage Vitals  Enc Vitals Group     BP 03/22/21 1340 (!) 87/55     Pulse Rate 03/22/21 1340 88     Resp 03/22/21 1340 18     Temp 03/22/21 1340 97.7 F (36.5 C)     Temp Source 03/22/21 1340 Oral     SpO2 03/22/21 1340 91 %     Weight 03/22/21 1337 140 lb (63.5 kg)     Height 03/22/21 1337 '5\' 4"'  (1.626 m)     Head Circumference --      Peak Flow --      Pain Score 03/22/21 1336 8     Pain Loc --      Pain Edu? --      Excl. in [place]? --     Constitutional: Alert and oriented. Well appearing and in no acute distress. Eyes: Conjunctivae are normal. PERRL. EOMI. Head: Atraumatic. Nose: No congestion/rhinnorhea. Mouth/Throat: Mucous membranes are moist.  Oropharynx non-erythematous. Neck: No stridor. Cardiovascular: Normal rate, regular rhythm. Grossly normal heart sounds.  Good peripheral circulation. Respiratory: Normal respiratory effort.  No retractions. Lungs CTAB. Gastrointestinal: Soft and nontender. No distention. No abdominal bruits.  Rectal: Stool Hemoccult negative.  No masses.  No hemorrhoids.  Musculoskeletal: No lower extremity tenderness  Neurologic:  Normal speech and language. No gross focal neurologic deficits are appreciated.  Cranial nerves II through XII are intact.  Visual fields were not checked however.  I am unable to induce nystagmus and thrust testing was negative.  Cerebellar finger-nose and rapid alternating movements and hands were normal.  Patient does not report any numbness and motor strength is 5/5 throughout.  Patient only reports symptoms on standing.  Is quite possible that he is having orthostatic changes and nothing else. Skin:  Skin is  warm, dry and intact. No rash noted. Psychiatric: Mood and affect are normal. Speech and behavior are normal.  ____________________________________________   LABS (all labs ordered are listed, but only abnormal results are displayed)  Labs Reviewed  BASIC METABOLIC PANEL - Abnormal; Notable for the following components:      Result Value   Glucose, Bld 120 (*)    BUN 24 (*)    All other components within normal limits  CBC - Abnormal; Notable for the following components:   RBC 4.06 (*)    RDW 16.5 (*)    nRBC 0.3 (*)  All other components within normal limits  BRAIN NATRIURETIC PEPTIDE - Abnormal; Notable for the following components:   B Natriuretic Peptide 113.4 (*)    All other components within normal limits  CBG MONITORING, ED - Abnormal; Notable for the following components:   Glucose-Capillary 137 (*)    All other components within normal limits  RESP PANEL BY RT-PCR (FLU A&B, COVID) ARPGX2  HEPATIC FUNCTION PANEL  D-DIMER, QUANTITATIVE  URINALYSIS, ROUTINE W REFLEX MICROSCOPIC  PROCALCITONIN  CBC WITH DIFFERENTIAL/PLATELET  CBC WITH DIFFERENTIAL/PLATELET  TROPONIN I (HIGH SENSITIVITY)   ____________________________________________  EKG  EKG read interpreted by me shows normal sinus rhythm rate of 84 normal axis there is flipped T's inferiorly in V5 and 6.  These were present and April but not in the last EKG. ____________________________________________  RADIOLOGY Gertha Calkin, personally viewed and evaluated these images (plain radiographs) as part of my medical decision making, as well as reviewing the written report by the radiologist.  ED MD interpretation: Chest x-ray read by radiology reviewed by me is negative  Official radiology report(s): CT HEAD WO CONTRAST  Result Date: 03/22/2021 CLINICAL DATA:  Dizziness.  Recent falls EXAM: CT HEAD WITHOUT CONTRAST TECHNIQUE: Contiguous axial images were obtained from the base of the skull through the  vertex without intravenous contrast. COMPARISON:  CT head 05/25/2017 FINDINGS: Brain: Mild atrophy. Mild ventricular enlargement consistent with atrophy and stable. Mild white matter hypodensity bilaterally. Negative for acute hemorrhage Several punctate calcifications in the right occipital parietal lobe, not seen previously. No associated mass or infarct identified. Vascular: Negative for hyperdense vessel Skull: Negative Sinuses/Orbits: Negative Other: None IMPRESSION: Atrophy and chronic microvascular ischemia.  Negative for hemorrhage Interval development of several punctate calcifications in the right occipital parietal lobe of uncertain etiology. No associated mass identified by CT. This could be due to chronic infection, chronic ischemia, or less likely tumor. MRI brain without and with contrast recommended for further evaluation. Electronically Signed   By: Franchot Gallo M.D.   On: 03/22/2021 15:22   DG Chest Portable 1 View  Result Date: 03/22/2021 CLINICAL DATA:  Weakness with dizziness for the past few days. EXAM: PORTABLE CHEST 1 VIEW COMPARISON:  Radiographs 09/22/2020 and 09/10/2020.  CT 11/29/2020. FINDINGS: 1432 hours. Patient rotation to the left. The left subclavian AICD leads are unchanged, although the tip of the ventricular lead is incompletely visualized. The heart size and mediastinal contours are stable. The lungs are clear. There is no pleural effusion or pneumothorax. Old rib fractures are present on the left. Telemetry leads overlie the chest. IMPRESSION: Radiographically stable appearance of the chest. No evidence of active cardiopulmonary process. Electronically Signed   By: Richardean Sale M.D.   On: 03/22/2021 14:43    ____________________________________________   PROCEDURES  Procedure(s) performed (including Critical Care): Critical care time 20 minutes.  This includes reviewing the patient's old studies and lab work.  I have discussed his care with his family as well.   He has been in and out of the room several times monitoring his blood pressure adjusting the height of the IV bag so it runs faster during the rectal exam etc.  Procedures   ____________________________________________   INITIAL IMPRESSION / ASSESSMENT AND PLAN / ED COURSE  Patient reports he feels well however his blood pressure is dropping again.  He is now 57.  We will get a second IV started and give him more fluids.  I have went over the patient's presentation with him and  his family.  He has no headaches except for very mild 1.  He has a little bit of sinus discharge and a wet cough but nothing on chest x-ray.  He has no chest pain or tightness.  He has no abdominal pain no nausea no vomiting no diarrhea no dysuria he has no wounds that are infected looking his skin the on the left with a little bit of surrounding erythema but its very mild.  I am signing this patient out to oncoming physician.  He knows about the patient's presentation labs that are back and labs that are pending and then worsening hypotension.          ____________________________________________   FINAL CLINICAL IMPRESSION(S) / ED DIAGNOSES  Final diagnoses:  Hypotension, unspecified hypotension type  Dizziness     ED Discharge Orders     None        Note:  This document was prepared using Dragon voice recognition software and may include unintentional dictation errors.    Nena Polio, MD 03/22/21 [phone]

## 2021-03-23 ENCOUNTER — Inpatient Hospital Stay: Payer: Medicare Other

## 2021-03-23 ENCOUNTER — Observation Stay: Payer: Medicare Other

## 2021-03-23 DIAGNOSIS — C7931 Secondary malignant neoplasm of brain: Secondary | ICD-10-CM

## 2021-03-23 DIAGNOSIS — R0609 Other forms of dyspnea: Secondary | ICD-10-CM | POA: Diagnosis not present

## 2021-03-23 DIAGNOSIS — R0600 Dyspnea, unspecified: Secondary | ICD-10-CM | POA: Diagnosis present

## 2021-03-23 LAB — CBG MONITORING, ED
Glucose-Capillary: 147 mg/dL — ABNORMAL HIGH (ref 70–99)
Glucose-Capillary: 176 mg/dL — ABNORMAL HIGH (ref 70–99)

## 2021-03-23 LAB — GLUCOSE, CAPILLARY: Glucose-Capillary: 111 mg/dL — ABNORMAL HIGH (ref 70–99)

## 2021-03-23 LAB — HEMOGLOBIN A1C
Hgb A1c MFr Bld: 7 % — ABNORMAL HIGH (ref 4.8–5.6)
Mean Plasma Glucose: 154.2 mg/dL

## 2021-03-23 MED ORDER — FUROSEMIDE 10 MG/ML IJ SOLN
40.0000 mg | Freq: Once | INTRAMUSCULAR | Status: AC
  Administered 2021-03-23: 40 mg via INTRAVENOUS
  Filled 2021-03-23: qty 4

## 2021-03-23 MED ORDER — CETIRIZINE HCL 10 MG PO TABS
5.0000 mg | ORAL_TABLET | Freq: Every day | ORAL | Status: DC
  Administered 2021-03-23: 5 mg via ORAL
  Filled 2021-03-23 (×2): qty 1

## 2021-03-23 MED ORDER — IPRATROPIUM-ALBUTEROL 0.5-2.5 (3) MG/3ML IN SOLN: 3.0000 mL | Freq: Four times a day (QID) | RESPIRATORY_TRACT | Status: DC | PRN

## 2021-03-23 MED ORDER — AMLODIPINE BESYLATE 5 MG PO TABS: ORAL_TABLET | ORAL | Status: DC

## 2021-03-23 MED ORDER — SACUBITRIL-VALSARTAN 24-26 MG PO TABS: ORAL_TABLET | ORAL | Status: DC

## 2021-03-23 MED ORDER — METOPROLOL SUCCINATE ER 100 MG PO TB24: 50.0000 mg | ORAL_TABLET | Freq: Every day | ORAL | 3 refills | Status: DC

## 2021-03-23 MED ORDER — ISOSORBIDE MONONITRATE ER 30 MG PO TB24: ORAL_TABLET | ORAL | Status: DC

## 2021-03-23 MED ORDER — BUPROPION HCL 75 MG PO TABS
75.0000 mg | ORAL_TABLET | Freq: Every day | ORAL | Status: DC
  Administered 2021-03-23 – 2021-03-24 (×2): 75 mg via ORAL
  Filled 2021-03-23 (×2): qty 1

## 2021-03-23 MED ORDER — PANTOPRAZOLE SODIUM 40 MG PO TBEC
40.0000 mg | DELAYED_RELEASE_TABLET | Freq: Every evening | ORAL | Status: DC
  Administered 2021-03-23: 40 mg via ORAL
  Filled 2021-03-23: qty 1

## 2021-03-23 MED ORDER — FUROSEMIDE 40 MG PO TABS: 40.0000 mg | ORAL_TABLET | Freq: Every day | ORAL | Status: DC

## 2021-03-23 MED ORDER — IOHEXOL 300 MG/ML  SOLN
100.0000 mL | Freq: Once | INTRAMUSCULAR | Status: AC | PRN
  Administered 2021-03-23: 100 mL via INTRAVENOUS

## 2021-03-23 NOTE — Consult Note (Signed)
Addendum: Head CT with and without contrast was remarkable for a 8 mm enhancing lesion in paramidline R frontal lobe. There was no abnormality seen in the region of calcification, however MRI would be more sensitive. I personally reviewed the images and also showed them to patient and his sister and explained the appearance of the lesion was concerning for metastatic cancer in the brain. I will send a message to his radiation oncologist through epic and patient agreed to call their office Monday for an appointment. If his ICD is MRI compatible I would recommend MRI brain with and without contrast to be done as outpatient for further characterization. If not, recommend CT head with and without contrast in approximately 3 weeks to evaluate change. At this time, patient is asymptomatic from the brain lesion. No further inpatient neurologic workup indicated at this time.  Su Monks, MD [facility] [phone]  If 7pm- 7am, please page neurology on call as listed in [place].   NEUROLOGY CONSULTATION NOTE   Date of service: March 23, 2021 Patient Name: John Frank MRN:  [id number] DOB:  1956-03-26 Reason for consult: calcifications on head CT Requesting physician: Dr. Irene Pap _ _ _   _ __   _ __ _ _  __ __   _ __   __ _  History of Present Illness    This is a 65 yo man w/ recent hx lung cancer on XRT for lung cancer, tobacco abuse, ischemic cardiomyopathy s/p ICD implant, COPD, OSA, HTN, HL, CAD who presented with dizziness in the setting of hypotension with SBP in 80s. His dizziness has improved with IVF. Neurology is consulted 2/2 calcifications on noncon head CT in the R parieto-occipital region not previously seen on imaging.   ROS   Per HPI: all other systems reviewed and are negative  Past History   I have reviewed the following:  Past Medical History:  Diagnosis Date   AICD (automatic cardioverter/defibrillator) present    Angina at rest [facility])    Aortic  atherosclerosis ([place])    CAD (coronary artery disease)    s/p stents in 65   Chronic systolic CHF (congestive heart failure) ([place])    Cirrhosis of liver (HCC)    COPD (chronic obstructive pulmonary disease) (HCC)    Diabetes (HCC)    GERD (gastroesophageal reflux disease)    Heart attack ([place])    Hyperlipidemia    Hypertension    Ischemic cardiomyopathy    Paroxysmal ventricular tachycardia    Right lower lobe lung mass    Shortness of breath dyspnea    Sleep apnea    Past Surgical History:  Procedure Laterality Date   CARDIAC PACEMAKER PLACEMENT     cardiac stents     CARDIAC SURGERY     defibrillator and pacemaker   CORONARY ANGIOPLASTY     ESOPHAGOGASTRODUODENOSCOPY N/A 09/19/2014   Procedure: ESOPHAGOGASTRODUODENOSCOPY (EGD);  Surgeon: Lucilla Lame, MD;  Location: [facility] ENDOSCOPY;  Service: Endoscopy;  Laterality: N/A;   ESOPHAGOGASTRODUODENOSCOPY (EGD) WITH PROPOFOL N/A 09/28/2017   Procedure: ESOPHAGOGASTRODUODENOSCOPY (EGD) WITH PROPOFOL;  Surgeon: Virgel Manifold, MD;  Location: [facility] ENDOSCOPY;  Service: Endoscopy;  Laterality: N/A;   ESOPHAGOGASTRODUODENOSCOPY (EGD) WITH PROPOFOL N/A 04/19/2018   Procedure: ESOPHAGOGASTRODUODENOSCOPY (EGD) WITH PROPOFOL with Gastric Mapping;  Surgeon: [name], MD;  Location[facility] ENDOSCOPY;  Service: Gastroenterology;  Laterality: N/A;   ESOPHAGOGASTRODUODENOSCOPY (EGD) WITH PROPOFOL N/A 12/13/2018   Procedure: ESOPHAGOGASTRODUODENOSCOPY (EGD) WITH PROPOFOL;  Surgeon: [name], MD;  Location: [facility] ENDOSCOPY;  Service: Gastroenterology;  Laterality: N/A;   ESOPHAGOGASTRODUODENOSCOPY (EGD) WITH PROPOFOL N/A 03/31/2019   Procedure: ESOPHAGOGASTRODUODENOSCOPY (EGD) WITH PROPOFOL;  Surgeon: Lin Landsman, MD;  Location: [facility] ENDOSCOPY;  Service: Gastroenterology;  Laterality: N/A;   IMPLANTABLE CARDIOVERTER DEFIBRILLATOR (ICD) GENERATOR CHANGE Left 02/21/2020   Procedure: ICD GENERATOR CHANGE;  Surgeon: Isaias Cowman, MD;   Location: [facility] ORS;  Service: Cardiovascular;  Laterality: Left;   VIDEO BRONCHOSCOPY WITH ENDOBRONCHIAL NAVIGATION N/A 06/22/2020   Procedure: VIDEO BRONCHOSCOPY WITH ENDOBRONCHIAL NAVIGATION;  Surgeon: Ottie Glazier, MD;  Location: [facility] ORS;  Service: Thoracic;  Laterality: N/A;   VIDEO BRONCHOSCOPY WITH ENDOBRONCHIAL ULTRASOUND N/A 06/22/2020   Procedure: VIDEO BRONCHOSCOPY WITH ENDOBRONCHIAL ULTRASOUND;  Surgeon: Ottie Glazier, MD;  Location: [facility] ORS;  Service: Thoracic;  Laterality: N/A;   Family History  Problem Relation Age of Onset   Diabetes Mother    Heart attack Father 72   Social History   Socioeconomic History   Marital status: Single    Spouse name: Not on file   Number of children: Not on file   Years of education: Not on file   Highest education level: Not on file  Occupational History   Occupation: disabled  Tobacco Use   Smoking status: Every Day    Packs/day: 0.25    Years: 40.00    Pack years: 10.00    Types: Cigarettes   Smokeless tobacco: Never  Vaping Use   Vaping Use: Never used  Substance and Sexual Activity   Alcohol use: No   Drug use: Never   Sexual activity: Not on file  Other Topics Concern   Not on file  Social History Narrative   Independent at baseline.  Lives at home with his family   Social Determinants of Health   Financial Resource Strain: Not on file  Food Insecurity: Not on file  Transportation Needs: Not on file  Physical Activity: Not on file  Stress: Not on file  Social Connections: Not on file   No Known Allergies  Medications   Medications Prior to Admission  Medication Sig Dispense Refill Last Dose   albuterol (VENTOLIN HFA) 108 (90 Base) MCG/ACT inhaler Inhale 2 puffs into the lungs every 6 (six) hours as needed for shortness of breath or wheezing.   Unknown at PRN   aspirin 81 MG EC tablet Take 81 mg by mouth daily.      atorvastatin (LIPITOR) 40 MG tablet Take 40 mg by mouth daily.   03/22/2021 at 0800    buPROPion (WELLBUTRIN) 75 MG tablet Take 75 mg by mouth daily.   03/22/2021 at 0800   clopidogrel (PLAVIX) 75 MG tablet TAKE 1 TABLET (75 MG TOTAL) BY MOUTH DAILY. 90 tablet 3 03/22/2021 at 0800   Dulaglutide 3 MG/0.5ML SOPN Inject 3 mg into the skin once a week.      empagliflozin (JARDIANCE) 10 MG TABS tablet Take 10 mg by mouth daily with breakfast.   03/22/2021 at 0800   glimepiride (AMARYL) 4 MG tablet Take 4 mg by mouth daily with breakfast.   03/22/2021 at 0800   ipratropium-albuterol (DUONEB) 0.5-2.5 (3) MG/3ML SOLN Take 3 mLs by nebulization 4 (four) times daily.      levocetirizine (XYZAL) 5 MG tablet Take 5 mg by mouth at bedtime.   03/21/2021 at 2100   metFORMIN (GLUCOPHAGE) 1000 MG tablet Take 1,000 mg by mouth 2 (two) times daily.   03/22/2021 at 0800   nitroGLYCERIN (NITROSTAT) 0.4 MG SL tablet Place 0.4 mg under the tongue  every 5 (five) minutes as needed for chest pain.   Unknown at PRN   ONE TOUCH ULTRA TEST test strip USE TO TEST BLOOD SUGAR ONCE DAILY 100 each 3    pantoprazole (PROTONIX) 40 MG tablet Take 40 mg by mouth every evening.   03/21/2021 at 1800   spironolactone (ALDACTONE) 25 MG tablet TAKE 1/2 TABLET BY MOUTH EVERY DAY (Patient taking differently: Take 12.5 mg by mouth daily.) 45 tablet 3 03/22/2021 at 0800   sucralfate (CARAFATE) 1 g tablet Take 1 g by mouth 4 (four) times daily -  with meals and at bedtime.   03/22/2021 at 1200   budesonide-formoterol (SYMBICORT) 160-4.5 MCG/ACT inhaler Inhale 2 puffs into the lungs 2 (two) times daily.   Unknown at Unknown      Current Facility-Administered Medications:    aspirin EC tablet 81 mg, 81 mg, Oral, Daily, Kayleen Memos, DO, 81 mg at 03/23/21 0943   atorvastatin (LIPITOR) tablet 40 mg, 40 mg, Oral, Daily, Hall, Carole N, DO, 40 mg at 03/23/21 0943   enoxaparin (LOVENOX) injection 40 mg, 40 mg, Subcutaneous, Q24H, Hall, Carole N, DO, 40 mg at 03/22/21 2146   insulin aspart (novoLOG) injection 0-5 Units, 0-5 Units,  Subcutaneous, QHS, Hall, Carole N, DO   insulin aspart (novoLOG) injection 0-9 Units, 0-9 Units, Subcutaneous, TID WC, Hall, Carole N, DO, 1 Units at 03/23/21 1830   ipratropium-albuterol (DUONEB) 0.5-2.5 (3) MG/3ML nebulizer solution 3 mL, 3 mL, Nebulization, Q6H PRN, Enzo Bi, MD   mometasone-formoterol [facility]) 200-5 MCG/ACT inhaler 2 puff, 2 puff, Inhalation, BID, Hall, Carole N, DO, 2 puff at 03/23/21 0932   pantoprazole (PROTONIX) EC tablet 40 mg, 40 mg, Oral, QPM, Enzo Bi, MD  Vitals   Vitals:   03/23/21 1530 03/23/21 1825 03/23/21 1900 03/23/21 1939  BP: 119/73 (!) 90/30 110/62 107/67  Pulse: 83 93 83 80  Resp: 19 (!) 24 (!) 23 14  Temp:    97.9 F (36.6 C)  TempSrc:      SpO2: 95% 94% 95% 95%  Weight:      Height:         Body mass index is 22.31 kg/m.  Physical Exam   Physical Exam Gen: A&O x4, NAD HEENT: Atraumatic, normocephalic;mucous membranes moist; oropharynx clear, tongue without atrophy or fasciculations. Neck: Supple, trachea midline. Resp: CTAB, no w/r/r CV: RRR, no m/g/r; nml S1 and S2. 2+ symmetric peripheral pulses. Abd: soft/NT/ND; nabs x 4 quad Extrem: Nml bulk; no cyanosis, clubbing, or edema.  Neuro: *MS: A&O x4. Follows multi-step commands.  *Speech: fluid, nondysarthric, able to name and repeat *CN:    I: Deferred   II,III: PERRLA, VFF by confrontation, optic discs unable to be visualized 2/2 pupillary constriction   III,IV,VI: EOMI w/o nystagmus, no ptosis   V: Sensation intact from V1 to V3 to LT   VII: Eyelid closure was full.  Smile symmetric.   VIII: Hearing intact to voice   IX,X: Voice normal, palate elevates symmetrically    XI: SCM/trap 5/5 bilat   XII: Tongue protrudes midline, no atrophy or fasciculations   *Motor:   Normal bulk.  No tremor, rigidity or bradykinesia. No pronator drift.    Strength: Dlt Bic Tri WrE WrF FgS Gr HF KnF KnE PlF DoF    Left 5 5 5 5 5 5 5 5 5 5 5 5     Right 5 5 5 5 5 5 5 5 5 5 5 5      *Sensory:  Intact to light touch, pinprick, temperature vibration throughout. Symmetric. Propioception intact bilat.  No double-simultaneous extinction.  *Coordination:  Finger-to-nose, heel-to-shin, rapid alternating motions were intact. *Reflexes:  2+ and symmetric throughout without clonus; toes down-going bilat *Gait: deferred   Labs   CBC:  Recent Labs  Lab 03/22/21 1338 03/22/21 1841  WBC 7.0 5.9  NEUTROABS  --  4.0  HGB 13.2 11.8*  HCT 39.7 35.2*  MCV 97.8 98.3  PLT 331 417    Basic Metabolic Panel:  Lab Results  Component Value Date   NA 135 03/22/2021   K 4.4 03/22/2021   CO2 24 03/22/2021   GLUCOSE 120 (H) 03/22/2021   BUN 24 (H) 03/22/2021   CREATININE 1.12 03/22/2021   CALCIUM 8.9 03/22/2021   GFRNONAA >60 03/22/2021   GFRAA >60 01/15/2020   Lipid Panel:  Lab Results  Component Value Date   LDLCALC 36 01/01/2016   HgbA1c:  Lab Results  Component Value Date   HGBA1C 7.0 (H) 03/22/2021   Urine Drug Screen: No results found for: LABOPIA, COCAINSCRNUR, LABBENZ, AMPHETMU, THCU, LABBARB  Alcohol Level No results found for: ETH   Impression   This is a 65 yo man w/ recent hx lung cancer on XRT for lung cancer, tobacco abuse, ischemic cardiomyopathy s/p ICD implant, COPD, OSA, HTN, HL, CAD on whom neurology is consulted for new calcifications in the R parieto-occipital region on noncon head CT. He is neurologically intact and this was an incidental finding on scan performed for evaluation of dizziness (which resolved after tx of hypotension).  Recommendations   - Patient unsure if ICD is MR-compatible. Even if it is, we are not able to perform MRI with cardiac devices in place at [facility] [place]. Thus will order a head CT with and without contrast for further evaluation.   Will continue to follow. ______________________________________________________________________   Thank you for the opportunity to take part in the care of this patient. If you have any  further questions, please contact the neurology consultation attending.  Signed,  Su Monks, MD [facility] [phone]  If 7pm- 7am, please page neurology on call as listed in [place].

## 2021-03-23 NOTE — Progress Notes (Signed)
OT Cancellation Note  Patient Details Name: John Frank MRN: [id number] DOB: [birth year]   Cancelled Treatment:    Reason Eval/Treat Not Completed: Patient declined, no reason specified. Per chart review, and discussion with treatment team, pt reporting baseline independence to complete functional mobility and declining therapy evaluation this date. Per discussion with MD, OT to hold this date and re-attempt at later time/date as able.   Fredirick Maudlin, OTR/L [place]

## 2021-03-23 NOTE — ED Notes (Signed)
Messaged MD Billie Ruddy about whether or not patient is to be admitted or dc'd

## 2021-03-23 NOTE — Care Management CC44 (Signed)
Condition Code 44 Documentation Completed  Patient Details  Name: John Frank MRN: [id number] Date of Birth: 01/14/1956   Condition Code 44 given:  Yes Patient signature on Condition Code 44 notice:  Yes Documentation of 2 MD's agreement:  Yes Code 44 added to claim:  Yes    Shelbie Hutching, RN 03/23/2021, 1:46 PM

## 2021-03-23 NOTE — ED Notes (Signed)
Pt placed on 2 L [place] for 02 Sat of 86 on RA sitting in bed

## 2021-03-23 NOTE — Progress Notes (Signed)
PT Cancellation Note  Patient Details Name: John Frank MRN: [id number] DOB: 09-11-1955   Cancelled Treatment:    Reason Eval/Treat Not Completed: Other (comment).  Pt declines PT stating he is at [facility].  Family in agreement, but per pt and nursing informed that if he is not dc'd to home will return tomorrow for followup.  Pt likely to dc today per nsg.   Ramond Dial 03/23/2021, 3:06 PM  Mee Hives, PT PhD Acute Rehab Dept. Number: [place] [facility] [place]

## 2021-03-23 NOTE — Consult Note (Deleted)
[facility] Consultation Note  Patient ID: John Frank, MRN: [id number], DOB/AGE: 1955-05-14 65 y.o. Admit date: 03/22/2021   Date of Consult: 03/23/2021 Primary Physician: Idelle Crouch, MD Primary Cardiologist: None  Chief Complaint:  Chief Complaint  Patient presents with   Dizziness   Reason for Consult:  Chest pain bronchitis pneumonia  HPI: 65 y.o. male with known coronary artery disease status post coronary to bypass graft chronic kidney disease stage III with exacerbation hypertension diabetes and chronic obstructive pulmonary disease for which the patient has had recent episode of chest discomfort cough and congestion and weakness.  Patient was seen in the emergency room at which time the patient had a glomerular filtration rate of 41 troponin of 12/13 chest x-ray showing bronchitis and a CT angiogram showing coronary artery bypass graft and pneumonia and an EKG showing normal sinus rhythm otherwise normal EKG.  Patient has felt well with some oxygenation and improvements of cough and congestion and currently has no evidence of congestive heart failure or angina or acute coronary syndrome.  Past Medical History:  Diagnosis Date   AICD (automatic cardioverter/defibrillator) present    Angina at rest [facility])    Aortic atherosclerosis ([place])    CAD (coronary artery disease)    s/p stents in 7096   Chronic systolic CHF (congestive heart failure) (HCC)    Cirrhosis of liver (HCC)    COPD (chronic obstructive pulmonary disease) (HCC)    Diabetes (HCC)    GERD (gastroesophageal reflux disease)    Heart attack ([place])    Hyperlipidemia    Hypertension    Ischemic cardiomyopathy    Paroxysmal ventricular tachycardia    Right lower lobe lung mass    Shortness of breath dyspnea    Sleep apnea       Surgical History:  Past Surgical History:  Procedure Laterality Date   CARDIAC PACEMAKER PLACEMENT     cardiac stents     CARDIAC SURGERY     defibrillator and  pacemaker   CORONARY ANGIOPLASTY     ESOPHAGOGASTRODUODENOSCOPY N/A 09/19/2014   Procedure: ESOPHAGOGASTRODUODENOSCOPY (EGD);  Surgeon: Lucilla Lame, MD;  Location: [facility] ENDOSCOPY;  Service: Endoscopy;  Laterality: N/A;   ESOPHAGOGASTRODUODENOSCOPY (EGD) WITH PROPOFOL N/A 09/28/2017   Procedure: ESOPHAGOGASTRODUODENOSCOPY (EGD) WITH PROPOFOL;  Surgeon: Virgel Manifold, MD;  Location: [facility] ENDOSCOPY;  Service: Endoscopy;  Laterality: N/A;   ESOPHAGOGASTRODUODENOSCOPY (EGD) WITH PROPOFOL N/A 04/19/2018   Procedure: ESOPHAGOGASTRODUODENOSCOPY (EGD) WITH PROPOFOL with Gastric Mapping;  Surgeon: [name], MD;  Location: [facility] ENDOSCOPY;  Service: Gastroenterology;  Laterality: N/A;   ESOPHAGOGASTRODUODENOSCOPY (EGD) WITH PROPOFOL N/A 12/13/2018   Procedure: ESOPHAGOGASTRODUODENOSCOPY (EGD) WITH PROPOFOL;  Surgeon: [name], MD;  Location: [facility] ENDOSCOPY;  Service: Gastroenterology;  Laterality: N/A;   ESOPHAGOGASTRODUODENOSCOPY (EGD) WITH PROPOFOL N/A 03/31/2019   Procedure: ESOPHAGOGASTRODUODENOSCOPY (EGD) WITH PROPOFOL;  Surgeon: Lin Landsman, MD;  Location: [facility] ENDOSCOPY;  Service: Gastroenterology;  Laterality: N/A;   IMPLANTABLE CARDIOVERTER DEFIBRILLATOR (ICD) GENERATOR CHANGE Left 02/21/2020   Procedure: ICD GENERATOR CHANGE;  Surgeon: Isaias Cowman, MD;  Location: [facility] ORS;  Service: Cardiovascular;  Laterality: Left;   VIDEO BRONCHOSCOPY WITH ENDOBRONCHIAL NAVIGATION N/A 06/22/2020   Procedure: VIDEO BRONCHOSCOPY WITH ENDOBRONCHIAL NAVIGATION;  Surgeon: Ottie Glazier, MD;  Location: [facility] ORS;  Service: Thoracic;  Laterality: N/A;   VIDEO BRONCHOSCOPY WITH ENDOBRONCHIAL ULTRASOUND N/A 06/22/2020   Procedure: VIDEO BRONCHOSCOPY WITH ENDOBRONCHIAL ULTRASOUND;  Surgeon: Ottie Glazier, MD;  Location: [facility] ORS;  Service: Thoracic;  Laterality: N/A;  Home Meds: Prior to Admission medications   Medication Sig Start Date End Date Taking? Authorizing Provider  albuterol  (VENTOLIN HFA) 108 (90 Base) MCG/ACT inhaler Inhale 2 puffs into the lungs every 6 (six) hours as needed for shortness of breath or wheezing.   Yes [provider]  amLODipine (NORVASC) 5 MG tablet Take 5 mg by mouth at bedtime.   Yes [provider]  aspirin 81 MG EC tablet Take 81 mg by mouth daily.   Yes [provider]  atorvastatin (LIPITOR) 40 MG tablet Take 40 mg by mouth daily.   Yes [provider]  buPROPion (WELLBUTRIN) 75 MG tablet Take 75 mg by mouth daily.   Yes [provider]  clopidogrel (PLAVIX) 75 MG tablet TAKE 1 TABLET (75 MG TOTAL) BY MOUTH DAILY. 12/08/16  Yes Kathrine Haddock, NP  Dulaglutide 3 MG/0.5ML SOPN Inject 3 mg into the skin once a week.   Yes [provider]  empagliflozin (JARDIANCE) 10 MG TABS tablet Take 10 mg by mouth daily with breakfast.   Yes [provider]  furosemide (LASIX) 40 MG tablet Take 40 mg by mouth 2 (two) times daily. 10/29/20  Yes [provider]  glimepiride (AMARYL) 4 MG tablet Take 4 mg by mouth daily with breakfast.   Yes [provider]  ipratropium-albuterol (DUONEB) 0.5-2.5 (3) MG/3ML SOLN Take 3 mLs by nebulization 4 (four) times daily.   Yes [provider]  isosorbide mononitrate (IMDUR) 30 MG 24 hr tablet Take 30 mg by mouth daily.   Yes [provider]  levocetirizine (XYZAL) 5 MG tablet Take 5 mg by mouth at bedtime.   Yes [provider]  metFORMIN (GLUCOPHAGE) 1000 MG tablet Take 1,000 mg by mouth 2 (two) times daily.   Yes [provider]  metoprolol succinate (TOPROL-XL) 100 MG 24 hr tablet TAKE 1 TABLET BY MOUTH EVERY DAY Patient taking differently: Take 100 mg by mouth every evening. 07/15/20  Yes Darylene Price A, FNP  nitroGLYCERIN (NITROSTAT) 0.4 MG SL tablet Place 0.4 mg under the tongue every 5 (five) minutes as needed for chest pain.   Yes [provider]  ONE TOUCH ULTRA TEST test strip USE TO TEST  BLOOD SUGAR ONCE DAILY 10/07/16  Yes Johnson, Megan P, DO  pantoprazole (PROTONIX) 40 MG tablet Take 40 mg by mouth every evening.   Yes [provider]  sacubitril-valsartan (ENTRESTO) 24-26 MG Take 1 tablet by mouth 2 (two) times daily.   Yes [provider]  spironolactone (ALDACTONE) 25 MG tablet TAKE 1/2 TABLET BY MOUTH EVERY DAY Patient taking differently: Take 12.5 mg by mouth daily. 05/02/19  Yes Darylene Price A, FNP  sucralfate (CARAFATE) 1 g tablet Take 1 g by mouth 4 (four) times daily -  with meals and at bedtime.   Yes [provider]  budesonide-formoterol (SYMBICORT) 160-4.5 MCG/ACT inhaler Inhale 2 puffs into the lungs 2 (two) times daily.    [provider]    Inpatient Medications:   aspirin EC  81 mg Oral Daily   atorvastatin  40 mg Oral Daily   enoxaparin (LOVENOX) injection  40 mg Subcutaneous Q24H   insulin aspart  0-5 Units Subcutaneous QHS   insulin aspart  0-9 Units Subcutaneous TID WC   ipratropium-albuterol  3 mL Nebulization Q6H   mometasone-formoterol  2 puff Inhalation BID     Allergies: No Known Allergies  Social History   Socioeconomic History   Marital status: Single  Spouse name: Not on file   Number of children: Not on file   Years of education: Not on file   Highest education level: Not on file  Occupational History   Occupation: disabled  Tobacco Use   Smoking status: Every Day    Packs/day: 0.25    Years: 40.00    Pack years: 10.00    Types: Cigarettes   Smokeless tobacco: Never  Vaping Use   Vaping Use: Never used  Substance and Sexual Activity   Alcohol use: No   Drug use: Never   Sexual activity: Not on file  Other Topics Concern   Not on file  Social History Narrative   Independent at baseline.  Lives at home with his family   Social Determinants of Health   Financial Resource Strain: Not on file  Food Insecurity: Not on file  Transportation Needs: Not on file  Physical Activity: Not  on file  Stress: Not on file  Social Connections: Not on file  Intimate Partner Violence: Not on file     Family History  Problem Relation Age of Onset   Diabetes Mother    Heart attack Father 37     Review of Systems Positive for cough congestion Negative for: General:  chills, fever, night sweats or weight changes.  Cardiovascular: PND orthopnea syncope dizziness  Dermatological skin lesions rashes Respiratory: Positive for cough congestion Urologic: Frequent urination urination at night and hematuria Abdominal: negative for nausea, vomiting, diarrhea, bright red blood per rectum, melena, or hematemesis Neurologic: negative for visual changes, and/or hearing changes  All other systems reviewed and are otherwise negative except as noted above.  Labs: No results for input(s): CKTOTAL, CKMB, TROPONINI in the last 72 hours. Lab Results  Component Value Date   WBC 5.9 03/22/2021   HGB 11.8 (L) 03/22/2021   HCT 35.2 (L) 03/22/2021   MCV 98.3 03/22/2021   PLT 279 03/22/2021    Recent Labs  Lab 03/22/21 1338 03/22/21 1430 03/22/21 1841  NA 135  --   --   K 4.4  --   --   CL 102  --   --   CO2 24  --   --   BUN 24*  --   --   CREATININE 1.23  --  1.12  CALCIUM 8.9  --   --   PROT  --  7.6  --   BILITOT  --  1.2  --   ALKPHOS  --  101  --   ALT  --  15  --   AST  --  22  --   GLUCOSE 120*  --   --    Lab Results  Component Value Date   CHOL 106 01/01/2016   HDL 21 (L) 01/01/2016   LDLCALC 36 01/01/2016   TRIG 245 (H) 01/01/2016   Lab Results  Component Value Date   DDIMER 0.30 03/22/2021    Radiology/Studies:  CT HEAD WO CONTRAST  Result Date: 03/22/2021 CLINICAL DATA:  Dizziness.  Recent falls EXAM: CT HEAD WITHOUT CONTRAST TECHNIQUE: Contiguous axial images were obtained from the base of the skull through the vertex without intravenous contrast. COMPARISON:  CT head 05/25/2017 FINDINGS: Brain: Mild atrophy. Mild ventricular enlargement consistent with  atrophy and stable. Mild white matter hypodensity bilaterally. Negative for acute hemorrhage Several punctate calcifications in the right occipital parietal lobe, not seen previously. No associated mass or infarct identified. Vascular: Negative for hyperdense vessel Skull: Negative Sinuses/Orbits: Negative Other: None IMPRESSION:  Atrophy and chronic microvascular ischemia.  Negative for hemorrhage Interval development of several punctate calcifications in the right occipital parietal lobe of uncertain etiology. No associated mass identified by CT. This could be due to chronic infection, chronic ischemia, or less likely tumor. MRI brain without and with contrast recommended for further evaluation. Electronically Signed   By: Franchot Gallo M.D.   On: 03/22/2021 15:22   DG Chest Portable 1 View  Result Date: 03/22/2021 CLINICAL DATA:  Weakness with dizziness for the past few days. EXAM: PORTABLE CHEST 1 VIEW COMPARISON:  Radiographs 09/22/2020 and 09/10/2020.  CT 11/29/2020. FINDINGS: 1432 hours. Patient rotation to the left. The left subclavian AICD leads are unchanged, although the tip of the ventricular lead is incompletely visualized. The heart size and mediastinal contours are stable. The lungs are clear. There is no pleural effusion or pneumothorax. Old rib fractures are present on the left. Telemetry leads overlie the chest. IMPRESSION: Radiographically stable appearance of the chest. No evidence of active cardiopulmonary process. Electronically Signed   By: Richardean Sale M.D.   On: 03/22/2021 14:43    EKG: Normal sinus rhythm otherwise normal EKG  Weights: Filed Weights   03/22/21 1337 03/22/21 1424  Weight: 63.5 kg 59 kg     Physical Exam: Blood pressure (!) 92/57, pulse 74, temperature 97.7 F (36.5 C), temperature source Oral, resp. rate 18, height 5\' 4"  (1.626 m), weight 59 kg, SpO2 95 %. Body mass index is 22.31 kg/m. General: Well developed, well nourished, in no acute  distress. Head eyes ears nose throat: Normocephalic, atraumatic, sclera non-icteric, no xanthomas, nares are without discharge. No apparent thyromegaly and/or mass  Lungs: Normal respiratory effort.  Few wheezes, no rales, diffuse rhonchi.  Heart: RRR with normal S1 S2. no murmur gallop, no rub, PMI is normal size and placement, carotid upstroke normal without bruit, jugular venous pressure is normal Abdomen: Soft, non-tender, non-distended with normoactive bowel sounds. No hepatomegaly. No rebound/guarding. No obvious abdominal masses. Abdominal aorta is normal size without bruit Extremities: No edema. no cyanosis, no clubbing, no ulcers  Peripheral : 2+ bilateral upper extremity pulses, 2+ bilateral femoral pulses, 2+ bilateral dorsal pedal pulse Neuro: Alert and oriented. No facial asymmetry. No focal deficit. Moves all extremities spontaneously. Musculoskeletal: Normal muscle tone without kyphosis Psych:  Responds to questions appropriately with a normal affect.    Assessment: 65 year old male with known coronary disease status post coronary bypass graft hypertension hyperlipidemia due to diabetes with COPD having acute bronchitis and/or pneumonia causing shortness of breath and chest pain without evidence of congestive heart failure myocardial infarction and/or acute coronary syndrome  Plan: 1.  Continue supportive care and treatment of pneumonia or bronchitis and infection 2.  Continuation of risk factor modification for medication management of hypertension diabetes COPD and previous coronary bypass grafting 3.  No further cardiac diagnostics necessary at this time due to no evidence of acute coronary syndrome or congestive heart failure 4.  I will begin ambulation and follow-up for improvements of symptoms and adjustments after above  Signed, Corey Skains M.D. [facility] 03/23/2021, 6:44 AM

## 2021-03-23 NOTE — ED Notes (Signed)
Informed RN bed assigned 

## 2021-03-23 NOTE — ED Notes (Signed)
Pt O2 90% while sitting in the bed with no oxygen. While ambulating pt dropped to 88%. MD Billie Ruddy notified.

## 2021-03-23 NOTE — Care Management Obs Status (Signed)
MEDICARE OBSERVATION STATUS NOTIFICATION   Patient Details  Name: CEDERIC MOZLEY MRN: [id number] Date of Birth: 1956/02/01   Medicare Observation Status Notification Given:  Yes    Shelbie Hutching, RN 03/23/2021, 1:46 PM

## 2021-03-23 NOTE — Progress Notes (Addendum)
PROGRESS NOTE    John Frank  OFB:[id number] DOB: Mar 19, 1956 DOA: 03/22/2021 PCP: Idelle Crouch, MD  [id number]   Assessment & Plan:   Active Problems:   Postural dizziness with presyncope   Dyspnea   John Frank is a 65 y.o. male with medical history significant for paroxysmal ventricular tachycardia, coronary artery disease, status post biventricular ICD, right coronary artery stent placement, COPD, OSA, essential hypertension, hyperlipidemia, ischemic cardiomyopathy, lung mass, recently diagnosed lung cancer on radiation therapy for inoperable lung cancer, ongoing tobacco user, who presented to [facility] ED from [place] [facility] due to dizziness associated with 2 falls in the last 2 days and hypotension with SBP in the 80's.  His falls were unwitnessed, unclear if the patient had loss of consciousness, he is not aware if he did or not.  Upon presentation to the ED, he is hypotensive with SBP 81, improved with IV fluid hydration.  Patient is on multiple oral antihypertensives for his underlying cardiac disease.  CT head done in the ED showed atrophy and chronic microvascular ischemia, and Interval development of several punctate calcifications in the right occipital parietal lobe of uncertain etiology.  MRI could not be performed due to presence of ICD, therefore, CT head w and wo contrast ordered which showed a lesion likely to be met.   Presyncope 2/2 symptomatic hypotension Endorses dizziness prior to falling Hypotensive on presentation with SBP in the 80s, responded to IV fluid hydration.   --BP has been low normal since presentation even without any home BP meds. --No orthostasis today. Plan: --Hold home BP meds  Dyspnea on exertion --RN reported O2 sats between 88-90% on room air while walking.  Then various reports of O2 sats dropping to mid 80's, so was placed back on 2L O2. --received 2250 ml of IVF bolus on presentation.  Given baseline CHF, repeat CXR ordered, which  showed no abnormal finding. Plan: --IV lasix 40 x1 --Continue supplemental O2 to keep sats between 88-92%, wean as tolerated  Chronic combined diastolic and systolic CHF Last 2D echo done on 09/23/2020 showed LVEF less than 20% with global hypokinesis, grade 2 diastolic dysfunction BNP 782 --received 2250 ml of IVF bolus on presentation.  Given baseline CHF, repeat CXR ordered, which showed no abnormal finding. Plan: --IV lasix 40 x1   COPD, not on oxygen supplementation at baseline --cont home bronchodilator --DuoNeb as PRN --Continue supplemental O2 to keep sats between 88-92%, wean as tolerated  Recently diagnosed lung cancer, nonoperable, ongoing radiation --CT head w and wo contrast showed a 8 mm lesion likely to be met --outpatient MRI brain, per outpatient oncology   Tobacco use disorder Ongoing tobacco use Nicotine patch Needs tobacco cessation counseling.  GERD --cont home PPI   Type 2 diabetes, well controlled with hyperlipidemia --A1c 7.0 --SSI for now   Abnormal CT head CT head done in the ED showed atrophy and chronic microvascular ischemia.  Interval development of several punctate calcifications in the right occipital parietal lobe of uncertain etiology.   --neuro consulted  --CT head w and wo contrast showed a 8 mm lesion likely to be met --outpatient MRI brain, per outpatient oncology   OSA CPAP nightly   DVT prophylaxis: Lovenox SQ Code Status: Full code  Family Communication: sister updated at bedside today  Level of care: Med-Surg Dispo:   The patient is from: home Anticipated d/c is to: home Anticipated d/c date is: likely tomorrow Patient currently is not medically ready to d/c due to: dyspnea  Subjective and Interval History:  During rounds, pt reported feeling better.  BP wnl.  Pt reported his breathing was at baseline, although was placed on 2L.  Pt said he was ready to go home, however, he later reported that when RN got pt up to  walk, he felt short of breath on RA.  No BP drop or dizziness.  Repeat CXR with no abnormal finding.  Sister at bedside didn't feel comfortable with pt going home since he lives alone.     Objective: Vitals:   03/23/21 1515 03/23/21 1530 03/23/21 1825 03/23/21 1900  BP: 116/82 119/73 (!) 90/30 110/62  Pulse: 87 83 93 83  Resp: 18 19 (!) 24 (!) 23  Temp:      TempSrc:      SpO2: 96% 95% 94% 95%  Weight:      Height:        Intake/Output Summary (Last 24 hours) at 03/23/2021 1918 Last data filed at 03/23/2021 1437 Gross per 24 hour  Intake --  Output 325 ml  Net -325 ml   Filed Weights   03/22/21 1337 03/22/21 1424  Weight: 63.5 kg 59 kg    Examination:   Constitutional: NAD, AAOx3 HEENT: conjunctivae and lids normal, EOMI CV: No cyanosis.   RESP: normal respiratory effort, on 2L Extremities: No effusions, edema in BLE SKIN: warm, dry Neuro: II - XII grossly intact.     Data Reviewed: I have personally reviewed following labs and imaging studies  CBC: Recent Labs  Lab 03/22/21 1338 03/22/21 1841  WBC 7.0 5.9  NEUTROABS  --  4.0  HGB 13.2 11.8*  HCT 39.7 35.2*  MCV 97.8 98.3  PLT 331 279   Basic Metabolic Panel: Recent Labs  Lab 03/22/21 1338 03/22/21 1841  NA 135  --   K 4.4  --   CL 102  --   CO2 24  --   GLUCOSE 120*  --   BUN 24*  --   CREATININE 1.23 1.12  CALCIUM 8.9  --    GFR: Estimated Creatinine Clearance: 54.1 mL/min (by C-G formula based on SCr of 1.12 mg/dL). Liver Function Tests: Recent Labs  Lab 03/22/21 1430  AST 22  ALT 15  ALKPHOS 101  BILITOT 1.2  PROT 7.6  ALBUMIN 4.3   No results for input(s): LIPASE, AMYLASE in the last 168 hours. No results for input(s): AMMONIA in the last 168 hours. Coagulation Profile: No results for input(s): INR, PROTIME in the last 168 hours. Cardiac Enzymes: No results for input(s): CKTOTAL, CKMB, CKMBINDEX, TROPONINI in the last 168 hours. BNP (last 3 results) No results for input(s):  PROBNP in the last 8760 hours. HbA1C: Recent Labs    03/22/21 1841  HGBA1C 7.0*   CBG: Recent Labs  Lab 03/22/21 1343 03/22/21 2139 03/23/21 0934 03/23/21 1827  GLUCAP 137* 84 147* 176*   Lipid Profile: No results for input(s): CHOL, HDL, LDLCALC, TRIG, CHOLHDL, LDLDIRECT in the last 72 hours. Thyroid Function Tests: No results for input(s): TSH, T4TOTAL, FREET4, T3FREE, THYROIDAB in the last 72 hours. Anemia Panel: No results for input(s): VITAMINB12, FOLATE, FERRITIN, TIBC, IRON, RETICCTPCT in the last 72 hours. Sepsis Labs: Recent Labs  Lab 03/22/21 1436 03/22/21 1636 03/22/21 1825 03/22/21 2315  PROCALCITON <0.10  --   --   --   LATICACIDVEN  --  3.0* 2.0* 1.1    Recent Results (from the past 240 hour(s))  Resp Panel by RT-PCR (Flu A&B, Covid) Nasopharyngeal Swab  Status: None   Collection Time: 03/22/21  2:32 PM   Specimen: Nasopharyngeal Swab; Nasopharyngeal(NP) swabs in vial transport medium  Result Value Ref Range Status   SARS Coronavirus 2 by RT PCR NEGATIVE NEGATIVE Final    Comment: (NOTE) SARS-CoV-2 target nucleic acids are NOT DETECTED.  The SARS-CoV-2 RNA is generally detectable in upper respiratory specimens during the acute phase of infection. The lowest concentration of SARS-CoV-2 viral copies this assay can detect is 138 copies/mL. A negative result does not preclude SARS-Cov-2 infection and should not be used as the sole basis for treatment or other patient management decisions. A negative result may occur with  improper specimen collection/handling, submission of specimen other than nasopharyngeal swab, presence of viral mutation(s) within the areas targeted by this assay, and inadequate number of viral copies(<138 copies/mL). A negative result must be combined with clinical observations, patient history, and epidemiological information. The expected result is Negative.  Fact Sheet for Patients:   EntrepreneurPulse.com.au  Fact Sheet for Healthcare Providers:  IncredibleEmployment.be  This test is no t yet approved or cleared by the [place] FDA and  has been authorized for detection and/or diagnosis of SARS-CoV-2 by FDA under an Emergency Use Authorization (EUA). This EUA will remain  in effect (meaning this test can be used) for the duration of the COVID-19 declaration under Section 564(b)(1) of the Act, 21 U.S.C.section 360bbb-3(b)(1), unless the authorization is terminated  or revoked sooner.       Influenza A by PCR NEGATIVE NEGATIVE Final   Influenza B by PCR NEGATIVE NEGATIVE Final    Comment: (NOTE) The Xpert Xpress SARS-CoV-2/FLU/RSV plus assay is intended as an aid in the diagnosis of influenza from Nasopharyngeal swab specimens and should not be used as a sole basis for treatment. Nasal washings and aspirates are unacceptable for Xpert Xpress SARS-CoV-2/FLU/RSV testing.  Fact Sheet for Patients: EntrepreneurPulse.com.au  Fact Sheet for Healthcare Providers: IncredibleEmployment.be  This test is not yet approved or cleared by the [place] FDA and has been authorized for detection and/or diagnosis of SARS-CoV-2 by FDA under an Emergency Use Authorization (EUA). This EUA will remain in effect (meaning this test can be used) for the duration of the COVID-19 declaration under Section 564(b)(1) of the Act, 21 U.S.C. section 360bbb-3(b)(1), unless the authorization is terminated or revoked.  Performed at [facility], [address] [place],  [address]       Radiology Studies: CT HEAD WO CONTRAST  Result Date: 03/22/2021 CLINICAL DATA:  Dizziness.  Recent falls EXAM: CT HEAD WITHOUT CONTRAST TECHNIQUE: Contiguous axial images were obtained from the base of the skull through the vertex without intravenous contrast. COMPARISON:  CT head 05/25/2017 FINDINGS:  Brain: Mild atrophy. Mild ventricular enlargement consistent with atrophy and stable. Mild white matter hypodensity bilaterally. Negative for acute hemorrhage Several punctate calcifications in the right occipital parietal lobe, not seen previously. No associated mass or infarct identified. Vascular: Negative for hyperdense vessel Skull: Negative Sinuses/Orbits: Negative Other: None IMPRESSION: Atrophy and chronic microvascular ischemia.  Negative for hemorrhage Interval development of several punctate calcifications in the right occipital parietal lobe of uncertain etiology. No associated mass identified by CT. This could be due to chronic infection, chronic ischemia, or less likely tumor. MRI brain without and with contrast recommended for further evaluation. Electronically Signed   By: Franchot Gallo M.D.   On: 03/22/2021 15:22   CT HEAD W & WO CONTRAST (5MM)  Result Date: 03/23/2021 CLINICAL DATA:  Brain mass or lesion  further evaluation for potential underlying mass in patient w/ hx cancer and R parieto-occipital calcifactions on noncon head CT. Unable to obtain inpatient MRI 2/2 ICD EXAM: CT HEAD WITHOUT AND WITH CONTRAST TECHNIQUE: Contiguous axial images were obtained from the base of the skull through the vertex without and with intravenous contrast CONTRAST:  160mL OMNIPAQUE IOHEXOL 300 MG/ML  SOLN COMPARISON:  CT head March 22, 2021. FINDINGS: Brain: Approximately 8 mm enhancing lesion in the posterior paramidline right frontal lobe (series 4, image 23), concerning for a metastasis given patient's known history of cancer. Mild surrounding edema without mass effect. This lesion is separate from the punctate calcifications in the slightly more posterior right parieto-occipital region that were further described on recent noncontrast CT head. No obvious abnormal enhancement in the areas of calcification by CT. No evidence of acute large vascular territory infarct, acute hemorrhage, midline shift,  hydrocephalus, or extra-axial fluid collection. Vascular: No hyperdense vessel or unexpected calcification. Visible vessels are patent. Skull: No acute fracture. Sinuses/Orbits: Mild paranasal sinus mucosal thickening. Unremarkable orbits. Other: No mastoid effusions. IMPRESSION: 1. Approximately 8 mm enhancing lesion in the posterior paramidline right frontal lobe (series 4, image 23), concerning for a metastasis given the patient's known cancer. Mild surrounding edema without mass effect. 2. The above lesion is separate from the punctate calcifications in the slightly more posterior right parieto-occipital region that were further described on recent noncontrast CT head. No obvious abnormal enhancement in the areas of calcification by CT. Given that the patient reportedly can not receive an MRI, recommend short interval follow-up CT head with contrast to assess for change. Electronically Signed   By: Margaretha Sheffield M.D.   On: 03/23/2021 11:05   DG Chest Port 1 View  Result Date: 03/23/2021 CLINICAL DATA:  Dizziness EXAM: PORTABLE CHEST 1 VIEW COMPARISON:  March 22, 2021 FINDINGS: The cardiomediastinal silhouette is unchanged in contour.Unchanged irregular contour of the inferior RIGHT hilum consistent with known malignancy. LEFT chest AICD. No pleural effusion. No pneumothorax. No acute pleuroparenchymal abnormality. Visualized abdomen is unremarkable. Unchanged sclerotic focus of the LEFT proximal humerus likely reflecting a bone island. Remote RIGHT rib fractures. IMPRESSION: No acute cardiopulmonary abnormality. Electronically Signed   By: Valentino Saxon M.D.   On: 03/23/2021 14:56   DG Chest Portable 1 View  Result Date: 03/22/2021 CLINICAL DATA:  Weakness with dizziness for the past few days. EXAM: PORTABLE CHEST 1 VIEW COMPARISON:  Radiographs 09/22/2020 and 09/10/2020.  CT 11/29/2020. FINDINGS: 1432 hours. Patient rotation to the left. The left subclavian AICD leads are unchanged,  although the tip of the ventricular lead is incompletely visualized. The heart size and mediastinal contours are stable. The lungs are clear. There is no pleural effusion or pneumothorax. Old rib fractures are present on the left. Telemetry leads overlie the chest. IMPRESSION: Radiographically stable appearance of the chest. No evidence of active cardiopulmonary process. Electronically Signed   By: Richardean Sale M.D.   On: 03/22/2021 14:43     Scheduled Meds:  aspirin EC  81 mg Oral Daily   atorvastatin  40 mg Oral Daily   enoxaparin (LOVENOX) injection  40 mg Subcutaneous Q24H   insulin aspart  0-5 Units Subcutaneous QHS   insulin aspart  0-9 Units Subcutaneous TID WC   ipratropium-albuterol  3 mL Nebulization Q6H   mometasone-formoterol  2 puff Inhalation BID   Continuous Infusions:   LOS: 1 day     Enzo Bi, MD [facility] If 7PM-7AM, please contact night-coverage 03/23/2021, 7:18  PM

## 2021-03-23 NOTE — Consult Note (Signed)
[facility] Consultation Note  Patient ID: John Frank, MRN: [id number], DOB/AGE: 66-06-1955 66 y.o. Admit date: 03/22/2021   Date of Consult: 03/23/2021 Primary Physician: Idelle Crouch, MD Primary Cardiologist: [facility]  Chief Complaint:  Chief Complaint  Patient presents with   Dizziness   Reason for Consult:  Cardiovascular disease  HPI: 65 y.o. male with known coronary artery disease status post previous PCI and stent placement of right coronary artery with cardiomyopathy and episodes of ventricular tachycardia status post ICD placement.  The patient does have known cardiomyopathy with ejection fraction of 20% sleep apnea hypertension hyperlipidemia and newly diagnosed lung cancer.  Recently the patient has had some falling and weakness and fatigue but no evidence of primary chest pain or other cardiovascular symptoms.  The patient did have troponin level of 6/7 and a chest x-ray which was normal.  EKG shows normal sinus rhythm left atrial enlargement left interventricular conduction defect with T wave changes.  This is not significantly changed from before.  The patient does feel much better today with no evidence of anginal symptoms or congestive heart failure he continues to be a tobacco abuser.  We have discussed at length that it appears that the patient does not have any primary cardiac Origin of symptoms at this time and may investigate other things further and/or as a outpatient.  Past Medical History:  Diagnosis Date   AICD (automatic cardioverter/defibrillator) present    Angina at rest [facility])    Aortic atherosclerosis ([place])    CAD (coronary artery disease)    s/p stents in 3382   Chronic systolic CHF (congestive heart failure) (HCC)    Cirrhosis of liver (HCC)    COPD (chronic obstructive pulmonary disease) (HCC)    Diabetes (HCC)    GERD (gastroesophageal reflux disease)    Heart attack ([place])    Hyperlipidemia    Hypertension    Ischemic  cardiomyopathy    Paroxysmal ventricular tachycardia    Right lower lobe lung mass    Shortness of breath dyspnea    Sleep apnea       Surgical History:  Past Surgical History:  Procedure Laterality Date   CARDIAC PACEMAKER PLACEMENT     cardiac stents     CARDIAC SURGERY     defibrillator and pacemaker   CORONARY ANGIOPLASTY     ESOPHAGOGASTRODUODENOSCOPY N/A 09/19/2014   Procedure: ESOPHAGOGASTRODUODENOSCOPY (EGD);  Surgeon: Lucilla Lame, MD;  Location: [facility] ENDOSCOPY;  Service: Endoscopy;  Laterality: N/A;   ESOPHAGOGASTRODUODENOSCOPY (EGD) WITH PROPOFOL N/A 09/28/2017   Procedure: ESOPHAGOGASTRODUODENOSCOPY (EGD) WITH PROPOFOL;  Surgeon: Virgel Manifold, MD;  Location: [facility] ENDOSCOPY;  Service: Endoscopy;  Laterality: N/A;   ESOPHAGOGASTRODUODENOSCOPY (EGD) WITH PROPOFOL N/A 04/19/2018   Procedure: ESOPHAGOGASTRODUODENOSCOPY (EGD) WITH PROPOFOL with Gastric Mapping;  Surgeon: [name], MD;  Location: [facility] ENDOSCOPY;  Service: Gastroenterology;  Laterality: N/A;   ESOPHAGOGASTRODUODENOSCOPY (EGD) WITH PROPOFOL N/A 12/13/2018   Procedure: ESOPHAGOGASTRODUODENOSCOPY (EGD) WITH PROPOFOL;  Surgeon: [name], MD;  Location: [facility] ENDOSCOPY;  Service: Gastroenterology;  Laterality: N/A;   ESOPHAGOGASTRODUODENOSCOPY (EGD) WITH PROPOFOL N/A 03/31/2019   Procedure: ESOPHAGOGASTRODUODENOSCOPY (EGD) WITH PROPOFOL;  Surgeon: Lin Landsman, MD;  Location: [facility] ENDOSCOPY;  Service: Gastroenterology;  Laterality: N/A;   IMPLANTABLE CARDIOVERTER DEFIBRILLATOR (ICD) GENERATOR CHANGE Left 02/21/2020   Procedure: ICD GENERATOR CHANGE;  Surgeon: Isaias Cowman, MD;  Location: [facility] ORS;  Service: Cardiovascular;  Laterality: Left;   VIDEO BRONCHOSCOPY WITH ENDOBRONCHIAL NAVIGATION N/A 06/22/2020   Procedure: VIDEO BRONCHOSCOPY WITH ENDOBRONCHIAL NAVIGATION;  Surgeon: Ottie Glazier, MD;  Location: [facility] ORS;  Service: Thoracic;  Laterality: N/A;   VIDEO BRONCHOSCOPY WITH ENDOBRONCHIAL  ULTRASOUND N/A 06/22/2020   Procedure: VIDEO BRONCHOSCOPY WITH ENDOBRONCHIAL ULTRASOUND;  Surgeon: Ottie Glazier, MD;  Location: [facility] ORS;  Service: Thoracic;  Laterality: N/A;     Home Meds: Prior to Admission medications   Medication Sig Start Date End Date Taking? Authorizing Provider  albuterol (VENTOLIN HFA) 108 (90 Base) MCG/ACT inhaler Inhale 2 puffs into the lungs every 6 (six) hours as needed for shortness of breath or wheezing.   Yes [provider]  amLODipine (NORVASC) 5 MG tablet Take 5 mg by mouth at bedtime.   Yes [provider]  aspirin 81 MG EC tablet Take 81 mg by mouth daily.   Yes [provider]  atorvastatin (LIPITOR) 40 MG tablet Take 40 mg by mouth daily.   Yes [provider]  buPROPion (WELLBUTRIN) 75 MG tablet Take 75 mg by mouth daily.   Yes [provider]  clopidogrel (PLAVIX) 75 MG tablet TAKE 1 TABLET (75 MG TOTAL) BY MOUTH DAILY. 12/08/16  Yes Kathrine Haddock, NP  Dulaglutide 3 MG/0.5ML SOPN Inject 3 mg into the skin once a week.   Yes [provider]  empagliflozin (JARDIANCE) 10 MG TABS tablet Take 10 mg by mouth daily with breakfast.   Yes [provider]  furosemide (LASIX) 40 MG tablet Take 40 mg by mouth 2 (two) times daily. 10/29/20  Yes [provider]  glimepiride (AMARYL) 4 MG tablet Take 4 mg by mouth daily with breakfast.   Yes [provider]  ipratropium-albuterol (DUONEB) 0.5-2.5 (3) MG/3ML SOLN Take 3 mLs by nebulization 4 (four) times daily.   Yes [provider]  isosorbide mononitrate (IMDUR) 30 MG 24 hr tablet Take 30 mg by mouth daily.   Yes [provider]  levocetirizine (XYZAL) 5 MG tablet Take 5 mg by mouth at bedtime.   Yes [provider]  metFORMIN (GLUCOPHAGE) 1000 MG tablet Take 1,000 mg by mouth 2 (two) times daily.   Yes [provider]  metoprolol succinate (TOPROL-XL) 100 MG 24 hr tablet TAKE 1 TABLET BY MOUTH EVERY  DAY Patient taking differently: Take 100 mg by mouth every evening. 07/15/20  Yes Darylene Price A, FNP  nitroGLYCERIN (NITROSTAT) 0.4 MG SL tablet Place 0.4 mg under the tongue every 5 (five) minutes as needed for chest pain.   Yes [provider]  ONE TOUCH ULTRA TEST test strip USE TO TEST BLOOD SUGAR ONCE DAILY 10/07/16  Yes Johnson, Megan P, DO  pantoprazole (PROTONIX) 40 MG tablet Take 40 mg by mouth every evening.   Yes [provider]  sacubitril-valsartan (ENTRESTO) 24-26 MG Take 1 tablet by mouth 2 (two) times daily.   Yes [provider]  spironolactone (ALDACTONE) 25 MG tablet TAKE 1/2 TABLET BY MOUTH EVERY DAY Patient taking differently: Take 12.5 mg by mouth daily. 05/02/19  Yes Darylene Price A, FNP  sucralfate (CARAFATE) 1 g tablet Take 1 g by mouth 4 (four) times daily -  with meals and at bedtime.   Yes [provider]  budesonide-formoterol (SYMBICORT) 160-4.5 MCG/ACT inhaler Inhale 2 puffs into the lungs 2 (two) times daily.    [provider]    Inpatient Medications:   aspirin EC  81 mg Oral Daily   atorvastatin  40 mg Oral Daily   enoxaparin (LOVENOX) injection  40 mg Subcutaneous Q24H   insulin aspart  0-5 Units  Subcutaneous QHS   insulin aspart  0-9 Units Subcutaneous TID WC   ipratropium-albuterol  3 mL Nebulization Q6H   mometasone-formoterol  2 puff Inhalation BID     Allergies: No Known Allergies  Social History   Socioeconomic History   Marital status: Single    Spouse name: Not on file   Number of children: Not on file   Years of education: Not on file   Highest education level: Not on file  Occupational History   Occupation: disabled  Tobacco Use   Smoking status: Every Day    Packs/day: 0.25    Years: 40.00    Pack years: 10.00    Types: Cigarettes   Smokeless tobacco: Never  Vaping Use   Vaping Use: Never used  Substance and Sexual Activity   Alcohol use: No   Drug use: Never   Sexual activity:  Not on file  Other Topics Concern   Not on file  Social History Narrative   Independent at baseline.  Lives at home with his family   Social Determinants of Health   Financial Resource Strain: Not on file  Food Insecurity: Not on file  Transportation Needs: Not on file  Physical Activity: Not on file  Stress: Not on file  Social Connections: Not on file  Intimate Partner Violence: Not on file     Family History  Problem Relation Age of Onset   Diabetes Mother    Heart attack Father 43     Review of Systems Positive for weakness fatigue falls Negative for: General:  chills, fever, night sweats or weight changes.  Cardiovascular: PND orthopnea syncope dizziness  Dermatological skin lesions rashes Respiratory: Cough congestion Urologic: Frequent urination urination at night and hematuria Abdominal: negative for nausea, vomiting, diarrhea, bright red blood per rectum, melena, or hematemesis Neurologic: negative for visual changes, and/or hearing changes  All other systems reviewed and are otherwise negative except as noted above.  Labs: No results for input(s): CKTOTAL, CKMB, TROPONINI in the last 72 hours. Lab Results  Component Value Date   WBC 5.9 03/22/2021   HGB 11.8 (L) 03/22/2021   HCT 35.2 (L) 03/22/2021   MCV 98.3 03/22/2021   PLT 279 03/22/2021    Recent Labs  Lab 03/22/21 1338 03/22/21 1430 03/22/21 1841  NA 135  --   --   K 4.4  --   --   CL 102  --   --   CO2 24  --   --   BUN 24*  --   --   CREATININE 1.23  --  1.12  CALCIUM 8.9  --   --   PROT  --  7.6  --   BILITOT  --  1.2  --   ALKPHOS  --  101  --   ALT  --  15  --   AST  --  22  --   GLUCOSE 120*  --   --    Lab Results  Component Value Date   CHOL 106 01/01/2016   HDL 21 (L) 01/01/2016   LDLCALC 36 01/01/2016   TRIG 245 (H) 01/01/2016   Lab Results  Component Value Date   DDIMER 0.30 03/22/2021    Radiology/Studies:  CT HEAD WO CONTRAST  Result Date: 03/22/2021 CLINICAL  DATA:  Dizziness.  Recent falls EXAM: CT HEAD WITHOUT CONTRAST TECHNIQUE: Contiguous axial images were obtained from the base of the skull through the vertex without intravenous contrast. COMPARISON:  CT head 05/25/2017 FINDINGS:  Brain: Mild atrophy. Mild ventricular enlargement consistent with atrophy and stable. Mild white matter hypodensity bilaterally. Negative for acute hemorrhage Several punctate calcifications in the right occipital parietal lobe, not seen previously. No associated mass or infarct identified. Vascular: Negative for hyperdense vessel Skull: Negative Sinuses/Orbits: Negative Other: None IMPRESSION: Atrophy and chronic microvascular ischemia.  Negative for hemorrhage Interval development of several punctate calcifications in the right occipital parietal lobe of uncertain etiology. No associated mass identified by CT. This could be due to chronic infection, chronic ischemia, or less likely tumor. MRI brain without and with contrast recommended for further evaluation. Electronically Signed   By: Franchot Gallo M.D.   On: 03/22/2021 15:22   DG Chest Portable 1 View  Result Date: 03/22/2021 CLINICAL DATA:  Weakness with dizziness for the past few days. EXAM: PORTABLE CHEST 1 VIEW COMPARISON:  Radiographs 09/22/2020 and 09/10/2020.  CT 11/29/2020. FINDINGS: 1432 hours. Patient rotation to the left. The left subclavian AICD leads are unchanged, although the tip of the ventricular lead is incompletely visualized. The heart size and mediastinal contours are stable. The lungs are clear. There is no pleural effusion or pneumothorax. Old rib fractures are present on the left. Telemetry leads overlie the chest. IMPRESSION: Radiographically stable appearance of the chest. No evidence of active cardiopulmonary process. Electronically Signed   By: Richardean Sale M.D.   On: 03/22/2021 14:43    EKG: Normal sinus rhythm left atrial enlargement left interventricular conduction defect with other T wave  changes  Weights: Filed Weights   03/22/21 1337 03/22/21 1424  Weight: 63.5 kg 59 kg     Physical Exam: Blood pressure (!) 92/57, pulse 74, temperature 97.7 F (36.5 C), temperature source Oral, resp. rate 18, height 5\' 4"  (1.626 m), weight 59 kg, SpO2 95 %. Body mass index is 22.31 kg/m. General: Well developed, well nourished, in no acute distress. Head eyes ears nose throat: Normocephalic, atraumatic, sclera non-icteric, no xanthomas, nares are without discharge. No apparent thyromegaly and/or mass  Lungs: Normal respiratory effort.  no wheezes, no rales, no rhonchi.  Heart: RRR with normal S1 S2. no murmur gallop, no rub, PMI is normal size and placement, carotid upstroke normal without bruit, jugular venous pressure is normal Abdomen: Soft, non-tender, non-distended with normoactive bowel sounds. No hepatomegaly. No rebound/guarding. No obvious abdominal masses. Abdominal aorta is normal size without bruit Extremities: No edema. no cyanosis, no clubbing, no ulcers  Peripheral : 2+ bilateral upper extremity pulses, 2+ bilateral femoral pulses, 2+ bilateral dorsal pedal pulse Neuro: Alert and oriented. No facial asymmetry. No focal deficit. Moves all extremities spontaneously. Musculoskeletal: Normal muscle tone without kyphosis Psych:  Responds to questions appropriately with a normal affect.    Assessment: 65 year old male with known coronary disease status post coronary artery stenting cardiomyopathy history of ventricular tachycardia with ICD placement and ejection fraction of 20% with ongoing tobacco abuse sleep apnea hypertension hyperlipidemia with newly diagnosed lung cancer having weakness and fatigue but no evidence of congestive heart failure acute coronary syndrome and/or angina at this time  Plan: 1.  Continuation of observation throughout this morning with ambulation following for any significant concerns for cardiovascular symptoms and further investigation as  necessary 2.  No further cardiac diagnostics necessary at this time due to no evidence of congestive heart failure or anginal symptoms and/or acute coronary syndrome 3.  Continuation of current medical regimen for cardiomyopathy without change 4.  If ambulating well doing everything he wishes with no evidence of new cardiovascular symptoms okay  for discharge home from cardiac standpoint with follow-up next week  Signed, Corey Skains M.D. [facility] 03/23/2021, 6:39 AM

## 2021-03-24 DIAGNOSIS — R55 Syncope and collapse: Secondary | ICD-10-CM | POA: Diagnosis not present

## 2021-03-24 DIAGNOSIS — R42 Dizziness and giddiness: Secondary | ICD-10-CM | POA: Diagnosis not present

## 2021-03-24 LAB — BASIC METABOLIC PANEL
Anion gap: 8 (ref 5–15)
BUN: 13 mg/dL (ref 8–23)
CO2: 27 mmol/L (ref 22–32)
Calcium: 8.5 mg/dL — ABNORMAL LOW (ref 8.9–10.3)
Chloride: 103 mmol/L (ref 98–111)
Creatinine, Ser: 0.93 mg/dL (ref 0.61–1.24)
GFR, Estimated: >60 mL/min (ref >60)
Glucose, Bld: 109 mg/dL — ABNORMAL HIGH (ref 70–99)
Potassium: 3.5 mmol/L (ref 3.5–5.1)
Sodium: 138 mmol/L (ref 135–145)

## 2021-03-24 LAB — MAGNESIUM: Magnesium: 2.2 mg/dL (ref 1.7–2.4)

## 2021-03-24 LAB — CBC
HCT: 37.2 % — ABNORMAL LOW (ref 39.0–52.0)
Hemoglobin: 12.3 g/dL — ABNORMAL LOW (ref 13.0–17.0)
MCH: 32 pg (ref 26.0–34.0)
MCHC: 33.1 g/dL (ref 30.0–36.0)
MCV: 96.9 fL (ref 80.0–100.0)
Platelets: 277 10*3/uL (ref 150–400)
RBC: 3.84 MIL/uL — ABNORMAL LOW (ref 4.22–5.81)
RDW: 16.3 % — ABNORMAL HIGH (ref 11.5–15.5)
WBC: 5.6 10*3/uL (ref 4.0–10.5)
nRBC: 0 % (ref 0.0–0.2)

## 2021-03-24 LAB — GLUCOSE, CAPILLARY: Glucose-Capillary: 132 mg/dL — ABNORMAL HIGH (ref 70–99)

## 2021-03-24 MED ORDER — FUROSEMIDE 40 MG PO TABS: 40.0000 mg | ORAL_TABLET | Freq: Every day | ORAL | 0 refills | Status: DC

## 2021-03-24 MED ORDER — SPIRONOLACTONE 25 MG PO TABS: 12.5000 mg | ORAL_TABLET | Freq: Every day | ORAL | 0 refills | Status: DC

## 2021-03-24 MED ORDER — ATORVASTATIN CALCIUM 40 MG PO TABS: 40.0000 mg | ORAL_TABLET | Freq: Every day | ORAL | 0 refills | Status: AC

## 2021-03-24 MED ORDER — EMPAGLIFLOZIN 10 MG PO TABS: 10.0000 mg | ORAL_TABLET | Freq: Every day | ORAL | 0 refills | Status: AC

## 2021-03-24 MED ORDER — FUROSEMIDE 40 MG PO TABS
40.0000 mg | ORAL_TABLET | Freq: Every day | ORAL | Status: DC
  Administered 2021-03-24: 40 mg via ORAL
  Filled 2021-03-24: qty 1

## 2021-03-24 MED ORDER — PANTOPRAZOLE SODIUM 40 MG PO TBEC: 40.0000 mg | DELAYED_RELEASE_TABLET | Freq: Every evening | ORAL | 0 refills | Status: AC

## 2021-03-24 MED ORDER — METOPROLOL SUCCINATE ER 50 MG PO TB24: 50.0000 mg | ORAL_TABLET | Freq: Every day | ORAL | 0 refills | Status: DC

## 2021-03-24 MED ORDER — BUPROPION HCL 75 MG PO TABS: 75.0000 mg | ORAL_TABLET | Freq: Every day | ORAL | 0 refills | Status: AC

## 2021-03-24 MED ORDER — CLOPIDOGREL BISULFATE 75 MG PO TABS: 75.0000 mg | ORAL_TABLET | Freq: Every day | ORAL | 0 refills | Status: AC

## 2021-03-24 MED ORDER — METFORMIN HCL 1000 MG PO TABS: 1000.0000 mg | ORAL_TABLET | Freq: Two times a day (BID) | ORAL | 0 refills | Status: AC

## 2021-03-24 MED ORDER — SPIRONOLACTONE 25 MG PO TABS
12.5000 mg | ORAL_TABLET | Freq: Every day | ORAL | Status: DC
  Administered 2021-03-24: 12.5 mg via ORAL
  Filled 2021-03-24: qty 0.5
  Filled 2021-03-24: qty 1

## 2021-03-24 MED ORDER — METOPROLOL SUCCINATE ER 50 MG PO TB24
50.0000 mg | ORAL_TABLET | Freq: Every day | ORAL | Status: DC
  Administered 2021-03-24: 50 mg via ORAL
  Filled 2021-03-24: qty 1

## 2021-03-24 NOTE — Evaluation (Signed)
Physical Therapy Evaluation Patient Details Name: John Frank MRN: [id number] DOB: 01-Apr-1956 Today's Date: 03/24/2021  History of Present Illness  John Frank is a 65 y.o. male with medical history significant for paroxysmal ventricular tachycardia, coronary artery disease, status post biventricular ICD, right coronary artery stent placement, COPD, OSA, essential hypertension, hyperlipidemia, ischemic cardiomyopathy, lung mass, recently diagnosed lung cancer on radiation therapy for inoperable lung cancer, ongoing tobacco user, who presented to [facility] ED from [place] [facility] due to dizziness associated with 2 falls in the last 2 days and hypotension with SBP in the 80's.  His falls were unwitnessed, unclear if the patient had loss of consciousness, he is not aware if he did or not.  Upon presentation to the ED, he is hypotensive with SBP 81, improved with IV fluid hydration.  Patient is on multiple oral antihypertensives for his underlying cardiac disease.  CT head done in the ED showed atrophy and chronic microvascular ischemia.  Negative for hemorrhage.  Clinical Impression   Pt was found in bed with nursing staff present at start of therapy session. Pt was provided with orthostatic testing and demonstrated drop in systolic value greater than 20 only with 3 min of standing but did not experience any signs of lightheadedness, dizziness or LOB.  See orthostatics in flowsheet for further details. Pt able to ambulate in hallway with assistance from PT and with RW, which is altered from his baseline of utilization of a cane with home use. At home pt has good access and has elevator he uses to access his rooms. Pt does occasionally have to descend stairs when there is a fire drill, so some stair training or trials may be beneficial in future sessions. Pt has all necessary equipment at home to ensure safe discharge. Pt O2 sat did drop with walking but did not go below 90%. Pt will benefit from PT in the  acute setting in order to improve his strength, balance and to ensure safety following discharge.      Recommendations for follow up therapy are one component of a multi-disciplinary discharge planning process, led by the attending physician.  Recommendations may be updated based on patient status, additional functional criteria and insurance authorization.  Follow Up Recommendations Home health PT    Assistance Recommended at Discharge PRN  Functional Status Assessment Patient has had a recent decline in their functional status and demonstrates the ability to make significant improvements in function in a reasonable and predictable amount of time.  Equipment Recommendations  Other (comment) (already has necessary equipment)    Recommendations for Other Services       Precautions / Restrictions Precautions Precautions: Fall Precaution Comments: History of 2  falls over course of 2 days prior to admission. Restrictions Weight Bearing Restrictions: No      Mobility  Bed Mobility Overal bed mobility: Independent             General bed mobility comments: utilized bed rails for independend mobility in bed    Transfers Overall transfer level: Modified independent Equipment used: Standard walker                    Ambulation/Gait Ambulation/Gait assistance: Supervision Gait Distance (Feet): 200 Feet Assistive device: Standard walker Gait Pattern/deviations: Decreased stride length     Pre-gait activities: orthostatics, STS, assistance with changing gowns as gown was soiled. General Gait Details: AMbulated with AD, no lob noted.  Stairs  Wheelchair Mobility    Modified Rankin (Stroke Patients Only)       Balance                                             Pertinent Vitals/Pain Pain Assessment: No/denies pain    Home Living Family/patient expects to be discharged to:: Private residence Living Arrangements:  Alone Available Help at Discharge: Family Type of Home: Apartment Home Access: Other (comment) (elevator to his floor)       Home Layout: One level Home Equipment: Rolling Walker (2 wheels);Grab bars - tub/shower;Hand held shower head Additional Comments: Patient reports he has a walker he uses at home. Does not use oxygen at baseline.    Prior Function Prior Level of Function : Independent/Modified Independent             Mobility Comments: able to ambulate in room without use of AD, used AD in hallway, no LOB, dizziness or other s/s       Hand Dominance   Dominant Hand: Right    Extremity/Trunk Assessment        Lower Extremity Assessment Lower Extremity Assessment: Overall WFL for tasks assessed    Cervical / Trunk Assessment Cervical / Trunk Assessment: Normal  Communication   Communication: No difficulties  Cognition Arousal/Alertness: Awake/alert Behavior During Therapy: WFL for tasks assessed/performed Overall Cognitive Status: Within Functional Limits for tasks assessed                                          General Comments      Exercises Other Exercises Other Exercises: STS and ambulation to bathroom, standing for long duration as PT asssisted with doffing soiled gown and donning clean gown x 2 Other Exercises: Ambulation in hallway with CGA and RW x 200 feet, 02 sat measured folllowing hallway ambuaiton and was measured at 90%   Assessment/Plan    PT Assessment Patient needs continued PT services  PT Problem List Decreased strength;Decreased activity tolerance;Decreased balance;Decreased safety awareness;Decreased mobility       PT Treatment Interventions DME instruction;Gait training;Functional mobility training;Therapeutic activities;Therapeutic exercise;Balance training;Neuromuscular re-education;Patient/family education (has occasional need to descend stairs during fire drill at his place of residence)    PT Goals  (Current goals can be found in the Care Plan section)  Acute Rehab PT Goals Patient Stated Goal: Be safe to go home and maintain strength during hospital stay PT Goal Formulation: With patient Time For Goal Achievement: 04/07/21    Frequency Min 2X/week   Barriers to discharge        Co-evaluation               AM-PAC PT "6 Clicks" Mobility  Outcome Measure Help needed turning from your back to your side while in a flat bed without using bedrails?: A Little Help needed moving from lying on your back to sitting on the side of a flat bed without using bedrails?: A Little Help needed moving to and from a bed to a chair (including a wheelchair)?: None Help needed standing up from a chair using your arms (e.g., wheelchair or bedside chair)?: None Help needed to walk in hospital room?: A Little Help needed climbing 3-5 steps with a railing? : A Little 6 Click Score: 20  End of Session Equipment Utilized During Treatment: Gait belt Activity Tolerance: Patient tolerated treatment well;No increased pain Patient left: in chair;with call bell/phone within reach;with family/visitor present   PT Visit Diagnosis: History of falling (Z91.81);Other abnormalities of gait and mobility (R26.89)    Time: 0102-7253 PT Time Calculation (min) (ACUTE ONLY): 47 min   Charges:   PT Evaluation $PT Eval Moderate Complexity: 1 Mod PT Treatments $Therapeutic Exercise: 8-22 mins        Rivka Barbara PT, DPT    Particia Lather 03/24/2021, 9:51 AM

## 2021-03-24 NOTE — Progress Notes (Signed)
[facility] Encounter Note  Patient: John Frank / Admit Date: 03/22/2021 / Date of Encounter: 03/24/2021, 11:45 AM   Subjective: Patient feels much better than admission to the hospital.  There has been no evidence of rhythm disturbances or palpitations or ventricular tachycardia previously seen in the remote past.  No defibrillation shocks.  The patient has had apparent intravenous Lasix but it appears that the patient does have a positive balance of urine.  Chest x-ray was normal and there has been no EKG changes or elevation of troponin or acute coronary syndrome.  Family and the patient has been concerned of dizziness and falls which may be due to rhythm disturbances not seen at this time as well as blood pressure and heart rate issues not seen.  Based on the information listed above the patient is study for further ambulation and discharged home with the potential for further evaluation later  Review of Systems: Positive for: Dizziness Negative for: Vision change, hearing change, syncope, dizziness, nausea, vomiting,diarrhea, bloody stool, stomach pain, cough, congestion, diaphoresis, urinary frequency, urinary pain,skin lesions, skin rashes Others previously listed  Objective: Telemetry: Normal sinus rhythm Physical Exam: Blood pressure 100/70, pulse 82, temperature 98.1 F (36.7 C), resp. rate 18, height 5\' 4"  (1.626 m), weight 59.6 kg, SpO2 (P) 99 %. Body mass index is 22.55 kg/m. General: Well developed, well nourished, in no acute distress. Head: Normocephalic, atraumatic, sclera non-icteric, no xanthomas, nares are without discharge. Neck: No apparent masses Lungs: Normal respirations with no wheezes, no rhonchi, no rales , no crackles   Heart: Regular rate and rhythm, normal S1 S2, no murmur, no rub, no gallop, PMI is normal size and placement, carotid upstroke normal without bruit, jugular venous pressure normal Abdomen: Soft, non-tender, non-distended  with normoactive bowel sounds. No hepatosplenomegaly. Abdominal aorta is normal size without bruit Extremities: No edema, no clubbing, no cyanosis, no ulcers,  Peripheral: 2+ radial, 2+ femoral, 2+ dorsal pedal pulses Neuro: Alert and oriented. Moves all extremities spontaneously. Psych:  Responds to questions appropriately with a normal affect.   Intake/Output Summary (Last 24 hours) at 03/24/2021 1145 Last data filed at 03/23/2021 1437 Gross per 24 hour  Intake --  Output 325 ml  Net -325 ml    Inpatient Medications:   aspirin EC  81 mg Oral Daily   atorvastatin  40 mg Oral Daily   buPROPion  75 mg Oral Daily   cetirizine  5 mg Oral QHS   enoxaparin (LOVENOX) injection  40 mg Subcutaneous Q24H   furosemide  40 mg Oral Daily   insulin aspart  0-5 Units Subcutaneous QHS   insulin aspart  0-9 Units Subcutaneous TID WC   metoprolol succinate  50 mg Oral Daily   mometasone-formoterol  2 puff Inhalation BID   pantoprazole  40 mg Oral QPM   spironolactone  12.5 mg Oral Daily   Infusions:   Labs: Recent Labs    03/22/21 1338 03/22/21 1841 03/24/21 0511  NA 135  --  138  K 4.4  --  3.5  CL 102  --  103  CO2 24  --  27  GLUCOSE 120*  --  109*  BUN 24*  --  13  CREATININE 1.23 1.12 0.93  CALCIUM 8.9  --  8.5*  MG  --   --  2.2   Recent Labs    03/22/21 1430  AST 22  ALT 15  ALKPHOS 101  BILITOT 1.2  PROT 7.6  ALBUMIN 4.3  Recent Labs    03/22/21 1841 03/24/21 0511  WBC 5.9 5.6  NEUTROABS 4.0  --   HGB 11.8* 12.3*  HCT 35.2* 37.2*  MCV 98.3 96.9  PLT 279 277   No results for input(s): CKTOTAL, CKMB, TROPONINI in the last 72 hours. Invalid input(s): POCBNP Recent Labs    03/22/21 1841  HGBA1C 7.0*     Weights: Filed Weights   03/22/21 1337 03/22/21 1424 03/24/21 0500  Weight: 63.5 kg 59 kg 59.6 kg     Radiology/Studies:  CT HEAD WO CONTRAST  Result Date: 03/22/2021 CLINICAL DATA:  Dizziness.  Recent falls EXAM: CT HEAD WITHOUT CONTRAST  TECHNIQUE: Contiguous axial images were obtained from the base of the skull through the vertex without intravenous contrast. COMPARISON:  CT head 05/25/2017 FINDINGS: Brain: Mild atrophy. Mild ventricular enlargement consistent with atrophy and stable. Mild white matter hypodensity bilaterally. Negative for acute hemorrhage Several punctate calcifications in the right occipital parietal lobe, not seen previously. No associated mass or infarct identified. Vascular: Negative for hyperdense vessel Skull: Negative Sinuses/Orbits: Negative Other: None IMPRESSION: Atrophy and chronic microvascular ischemia.  Negative for hemorrhage Interval development of several punctate calcifications in the right occipital parietal lobe of uncertain etiology. No associated mass identified by CT. This could be due to chronic infection, chronic ischemia, or less likely tumor. MRI brain without and with contrast recommended for further evaluation. Electronically Signed   By: Franchot Gallo M.D.   On: 03/22/2021 15:22   CT HEAD W & WO CONTRAST (5MM)  Result Date: 03/23/2021 CLINICAL DATA:  Brain mass or lesion further evaluation for potential underlying mass in patient w/ hx cancer and R parieto-occipital calcifactions on noncon head CT. Unable to obtain inpatient MRI 2/2 ICD EXAM: CT HEAD WITHOUT AND WITH CONTRAST TECHNIQUE: Contiguous axial images were obtained from the base of the skull through the vertex without and with intravenous contrast CONTRAST:  170mL OMNIPAQUE IOHEXOL 300 MG/ML  SOLN COMPARISON:  CT head March 22, 2021. FINDINGS: Brain: Approximately 8 mm enhancing lesion in the posterior paramidline right frontal lobe (series 4, image 23), concerning for a metastasis given patient's known history of cancer. Mild surrounding edema without mass effect. This lesion is separate from the punctate calcifications in the slightly more posterior right parieto-occipital region that were further described on recent noncontrast CT  head. No obvious abnormal enhancement in the areas of calcification by CT. No evidence of acute large vascular territory infarct, acute hemorrhage, midline shift, hydrocephalus, or extra-axial fluid collection. Vascular: No hyperdense vessel or unexpected calcification. Visible vessels are patent. Skull: No acute fracture. Sinuses/Orbits: Mild paranasal sinus mucosal thickening. Unremarkable orbits. Other: No mastoid effusions. IMPRESSION: 1. Approximately 8 mm enhancing lesion in the posterior paramidline right frontal lobe (series 4, image 23), concerning for a metastasis given the patient's known cancer. Mild surrounding edema without mass effect. 2. The above lesion is separate from the punctate calcifications in the slightly more posterior right parieto-occipital region that were further described on recent noncontrast CT head. No obvious abnormal enhancement in the areas of calcification by CT. Given that the patient reportedly can not receive an MRI, recommend short interval follow-up CT head with contrast to assess for change. Electronically Signed   By: Margaretha Sheffield M.D.   On: 03/23/2021 11:05   DG Chest Port 1 View  Result Date: 03/23/2021 CLINICAL DATA:  Dizziness EXAM: PORTABLE CHEST 1 VIEW COMPARISON:  March 22, 2021 FINDINGS: The cardiomediastinal silhouette is unchanged in contour.Unchanged irregular contour of  the inferior RIGHT hilum consistent with known malignancy. LEFT chest AICD. No pleural effusion. No pneumothorax. No acute pleuroparenchymal abnormality. Visualized abdomen is unremarkable. Unchanged sclerotic focus of the LEFT proximal humerus likely reflecting a bone island. Remote RIGHT rib fractures. IMPRESSION: No acute cardiopulmonary abnormality. Electronically Signed   By: Valentino Saxon M.D.   On: 03/23/2021 14:56   DG Chest Portable 1 View  Result Date: 03/22/2021 CLINICAL DATA:  Weakness with dizziness for the past few days. EXAM: PORTABLE CHEST 1 VIEW  COMPARISON:  Radiographs 09/22/2020 and 09/10/2020.  CT 11/29/2020. FINDINGS: 1432 hours. Patient rotation to the left. The left subclavian AICD leads are unchanged, although the tip of the ventricular lead is incompletely visualized. The heart size and mediastinal contours are stable. The lungs are clear. There is no pleural effusion or pneumothorax. Old rib fractures are present on the left. Telemetry leads overlie the chest. IMPRESSION: Radiographically stable appearance of the chest. No evidence of active cardiopulmonary process. Electronically Signed   By: Richardean Sale M.D.   On: 03/22/2021 14:43     Assessment and Recommendation  64 y.o. male with known dilated cardiomyopathy coronary artery disease defibrillator with ejection fraction of 20% having weakness and fatigue dizziness and falls of unknown etiology with no evidence of rhythm disturbances or acute myocardial infarction or congestive heart failure now feeling improved 1.  No further cardiac diagnostics necessary at this time due to no evidence of myocardial infarction or worsening congestive heart failure 2.  Begin ambulation and follow for improvements of symptoms and okay for discharge home from cardiac standpoint if no rhythm disturbances with further evaluation and treatment options of Holter monitor and/or interrogation of defibrillator and/or further investigation of concerns for falls and ambulating as per above 3.  No change in current medical regimen including furosemide atorvastatin and beta-blocker with adjustments as outpatient next week  Signed, Serafina Royals M.D. FACC

## 2021-03-24 NOTE — Discharge Summary (Signed)
Physician Discharge Summary   John Frank  male DOB: [birth year]  VCB:[id number]  PCP: Idelle Crouch, MD  Admit date: 03/22/2021 Discharge date: 03/24/2021  Admitted From: home Disposition:  home Sisters updated at bedside prior to discharge.  Home Health: declined. CODE STATUS: Full code  Discharge Instructions     Ambulatory referral to Neurology   Complete by: As directed    An appointment is requested in approximately: 4 weeks   Discharge instructions   Complete by: As directed    Because all your pills are in blister pack and you can't pick out which ones to hold, I have resent all the medications I want you to take to your pharmacy in 1 month supply, so take those instead of your current blister packs.    Your blood pressure has been low normal even without any of your home blood pressure medication.  That's the likely reason for your dizziness and syncope.  Please hold amlodipine, Imdur and Entresto.  Lasix is reduced to once daily, and Aldactone continued to prevent fluid overload from your congestive heart failure.  Home Toprol is reduced to 50 mg daily.  Please follow up with your outpatient cardiology for further adjustment.  You have at least 1 lesion in your brain from CT scan that's suspicious for cancer spread.  Please follow up with your oncologist for outpatient MRI brain to further evaluate.  [facility] can not perform [facility] with pacemaker in.   Dr. Enzo Bi [facility] Course:  For full details, please see H&P, progress notes, consult notes and ancillary notes.  Briefly,  John Frank is a 65 y.o. male with medical history significant for paroxysmal ventricular tachycardia, coronary artery disease, status post biventricular ICD, right coronary artery stent placement, COPD, OSA, essential hypertension, recently diagnosed inoperable lung cancer on radiation therapy, ongoing tobacco user, who presented to [facility] ED from [place] clinic  due to dizziness associated with 2 falls in the last 2 days and hypotension with SBP in the 80's.    Upon presentation to the ED, he was hypotensive with SBP 81, improved with IV fluid hydration.  Patient is on multiple oral antihypertensives for his underlying cardiac disease.  CT head done in the ED showed atrophy and chronic microvascular ischemia, and Interval development of several punctate calcifications in the right occipital parietal lobe of uncertain etiology.  MRI could not be performed due to presence of ICD, therefore, CT head w and wo contrast ordered which showed a lesion likely to be cancer met.   Presyncope 2/2 symptomatic hypotension Endorses dizziness prior to falling Hypotensive on presentation with SBP in the 80s, responded to IV fluid hydration.   --BP has been low normal since presentation even without any home BP meds. --No orthostasis. --At discharge, amlodipine, Imdur and Entresto held.  Lasix is reduced to once daily, and Aldactone continued to prevent fluid overload from congestive heart failure.  Home Toprol is reduced to 50 mg daily.  Pt will follow up with outpatient cardiology for further adjustment.   Dyspnea on exertion --RN reported O2 sats between 88-90% on room air while walking.  Then various reports of O2 sats dropping to mid 80's, so was placed back on 2L O2. --Pt received 2250 ml of IVF bolus on presentation.  Given baseline CHF, repeat CXR ordered, which showed no abnormal finding.   --IV lasix 40 x1 given. --Ambulation test done prior to discharge, and pt's  O2 sats did not go below 90% with walking, which is acceptable given COPD.     Chronic combined diastolic and systolic CHF Last 2D echo done on 09/23/2020 showed LVEF less than 20% with global hypokinesis, grade 2 diastolic dysfunction --Pt did not appear to be in CHF exacerbation.  IV lasix 40 x1 given due to pt having received 2250 ml of IVF bolus on presentation.   --Due to hypotension, home Lasix is  reduced to once daily, and Aldactone continued at discharge.   COPD, not on oxygen supplementation at baseline --stable. --cont home bronchodilator --DuoNeb as PRN   Recently diagnosed lung cancer, nonoperable, ongoing radiation --CT head w and wo contrast showed a 8 mm lesion likely to be cancer met. --outpatient MRI brain, to be odered by outpatient oncology   Tobacco use disorder Ongoing tobacco use   GERD --cont home PPI   Type 2 diabetes, well controlled with hyperlipidemia --A1c 7.0 --SSI while inpatient.  Discharged on home regimen.   Abnormal CT head CT head done in the ED showed atrophy and chronic microvascular ischemia.  Interval development of several punctate calcifications in the right occipital parietal lobe of uncertain etiology.   --neuro consulted  --CT head w and wo contrast showed a 8 mm lesion likely to be met --outpatient MRI brain, per outpatient oncology   OSA CPAP nightly   Discharge Diagnoses:  Active Problems:   Postural dizziness with presyncope   Dyspnea   30 Day Unplanned Readmission Risk Score    Flowsheet Row ED to Hosp-Admission (Current) from 03/22/2021 in [facility]  30 Day Unplanned Readmission Risk Score (%) 16.55 Filed at 03/23/2021 1200       This score is the patient's risk of an unplanned readmission within 30 days of being discharged (0 -100%). The score is based on dignosis, age, lab data, medications, orders, and past utilization.   Low:  0-14.9   Medium: 15-21.9   High: 22-29.9   Extreme: 30 and above         Discharge Instructions:  Allergies as of 03/24/2021   No Known Allergies      Medication List     STOP taking these medications    glimepiride 4 MG tablet Commonly known as: AMARYL       TAKE these medications    albuterol 108 (90 Base) MCG/ACT inhaler Commonly known as: VENTOLIN HFA Inhale 2 puffs into the lungs every 6 (six) hours as needed for shortness of breath or  wheezing.   amLODipine 5 MG tablet Commonly known as: NORVASC Hold due to low blood pressure. What changed:  how much to take how to take this when to take this additional instructions   aspirin 81 MG EC tablet Take 81 mg by mouth daily.   atorvastatin 40 MG tablet Commonly known as: LIPITOR Take 1 tablet (40 mg total) by mouth daily.   budesonide-formoterol 160-4.5 MCG/ACT inhaler Commonly known as: SYMBICORT Inhale 2 puffs into the lungs 2 (two) times daily.   buPROPion 75 MG tablet Commonly known as: WELLBUTRIN Take 1 tablet (75 mg total) by mouth daily.   clopidogrel 75 MG tablet Commonly known as: PLAVIX Take 1 tablet (75 mg total) by mouth daily.   Dulaglutide 3 MG/0.5ML Sopn Inject 3 mg into the skin once a week.   empagliflozin 10 MG Tabs tablet Commonly known as: JARDIANCE Take 1 tablet (10 mg total) by mouth daily with breakfast.   furosemide 40 MG tablet Commonly  known as: LASIX Take 1 tablet (40 mg total) by mouth daily. Reduced from twice daily due to low blood pressure. What changed:  when to take this additional instructions   ipratropium-albuterol 0.5-2.5 (3) MG/3ML Soln Commonly known as: DUONEB Take 3 mLs by nebulization 4 (four) times daily.   isosorbide mononitrate 30 MG 24 hr tablet Commonly known as: IMDUR Hold due to low blood pressure. What changed:  how much to take how to take this when to take this additional instructions   levocetirizine 5 MG tablet Commonly known as: XYZAL Take 5 mg by mouth at bedtime.   metFORMIN 1000 MG tablet Commonly known as: GLUCOPHAGE Take 1 tablet (1,000 mg total) by mouth 2 (two) times daily.   metoprolol succinate 50 MG 24 hr tablet Commonly known as: TOPROL-XL Take 1 tablet (50 mg total) by mouth daily. Reduce from 100 mg daily. What changed:  medication strength how much to take additional instructions   nitroGLYCERIN 0.4 MG SL tablet Commonly known as: NITROSTAT Place 0.4 mg under  the tongue every 5 (five) minutes as needed for chest pain.   ONE TOUCH ULTRA TEST test strip Generic drug: glucose blood USE TO TEST BLOOD SUGAR ONCE DAILY   pantoprazole 40 MG tablet Commonly known as: PROTONIX Take 1 tablet (40 mg total) by mouth every evening.   sacubitril-valsartan 24-26 MG Commonly known as: ENTRESTO Hold due to low blood pressure. What changed:  how much to take how to take this when to take this additional instructions   spironolactone 25 MG tablet Commonly known as: ALDACTONE Take 0.5 tablets (12.5 mg total) by mouth daily.   sucralfate 1 g tablet Commonly known as: CARAFATE Take 1 g by mouth 4 (four) times daily -  with meals and at bedtime.         Follow-up Information     Corey Skains, MD Follow up in 1 week(s).   Specialty: Cardiology Contact information: [facility] [place] [address] [phone]         Idelle Crouch, MD Follow up in 1 week(s).   Specialty: Internal Medicine Contact information: [place] [address] [phone]                 No Known Allergies   The results of significant diagnostics from this hospitalization (including imaging, microbiology, ancillary and laboratory) are listed below for reference.   Consultations:   Procedures/Studies: CT HEAD WO CONTRAST  Result Date: 03/22/2021 CLINICAL DATA:  Dizziness.  Recent falls EXAM: CT HEAD WITHOUT CONTRAST TECHNIQUE: Contiguous axial images were obtained from the base of the skull through the vertex without intravenous contrast. COMPARISON:  CT head 05/25/2017 FINDINGS: Brain: Mild atrophy. Mild ventricular enlargement consistent with atrophy and stable. Mild white matter hypodensity bilaterally. Negative for acute hemorrhage Several punctate calcifications in the right occipital parietal lobe, not seen previously. No associated mass or infarct  identified. Vascular: Negative for hyperdense vessel Skull: Negative Sinuses/Orbits: Negative Other: None IMPRESSION: Atrophy and chronic microvascular ischemia.  Negative for hemorrhage Interval development of several punctate calcifications in the right occipital parietal lobe of uncertain etiology. No associated mass identified by CT. This could be due to chronic infection, chronic ischemia, or less likely tumor. MRI brain without and with contrast recommended for further evaluation. Electronically Signed   By: Franchot Gallo M.D.   On: 03/22/2021 15:22   CT HEAD W & WO CONTRAST (5MM)  Result  Date: 03/23/2021 CLINICAL DATA:  Brain mass or lesion further evaluation for potential underlying mass in patient w/ hx cancer and R parieto-occipital calcifactions on noncon head CT. Unable to obtain inpatient MRI 2/2 ICD EXAM: CT HEAD WITHOUT AND WITH CONTRAST TECHNIQUE: Contiguous axial images were obtained from the base of the skull through the vertex without and with intravenous contrast CONTRAST:  115m OMNIPAQUE IOHEXOL 300 MG/ML  SOLN COMPARISON:  CT head March 22, 2021. FINDINGS: Brain: Approximately 8 mm enhancing lesion in the posterior paramidline right frontal lobe (series 4, image 23), concerning for a metastasis given patient's known history of cancer. Mild surrounding edema without mass effect. This lesion is separate from the punctate calcifications in the slightly more posterior right parieto-occipital region that were further described on recent noncontrast CT head. No obvious abnormal enhancement in the areas of calcification by CT. No evidence of acute large vascular territory infarct, acute hemorrhage, midline shift, hydrocephalus, or extra-axial fluid collection. Vascular: No hyperdense vessel or unexpected calcification. Visible vessels are patent. Skull: No acute fracture. Sinuses/Orbits: Mild paranasal sinus mucosal thickening. Unremarkable orbits. Other: No mastoid effusions. IMPRESSION: 1.  Approximately 8 mm enhancing lesion in the posterior paramidline right frontal lobe (series 4, image 23), concerning for a metastasis given the patient's known cancer. Mild surrounding edema without mass effect. 2. The above lesion is separate from the punctate calcifications in the slightly more posterior right parieto-occipital region that were further described on recent noncontrast CT head. No obvious abnormal enhancement in the areas of calcification by CT. Given that the patient reportedly can not receive an MRI, recommend short interval follow-up CT head with contrast to assess for change. Electronically Signed   By: FMargaretha SheffieldM.D.   On: 03/23/2021 11:05   DG Chest Port 1 View  Result Date: 03/23/2021 CLINICAL DATA:  Dizziness EXAM: PORTABLE CHEST 1 VIEW COMPARISON:  March 22, 2021 FINDINGS: The cardiomediastinal silhouette is unchanged in contour.Unchanged irregular contour of the inferior RIGHT hilum consistent with known malignancy. LEFT chest AICD. No pleural effusion. No pneumothorax. No acute pleuroparenchymal abnormality. Visualized abdomen is unremarkable. Unchanged sclerotic focus of the LEFT proximal humerus likely reflecting a bone island. Remote RIGHT rib fractures. IMPRESSION: No acute cardiopulmonary abnormality. Electronically Signed   By: SValentino SaxonM.D.   On: 03/23/2021 14:56   DG Chest Portable 1 View  Result Date: 03/22/2021 CLINICAL DATA:  Weakness with dizziness for the past few days. EXAM: PORTABLE CHEST 1 VIEW COMPARISON:  Radiographs 09/22/2020 and 09/10/2020.  CT 11/29/2020. FINDINGS: 1432 hours. Patient rotation to the left. The left subclavian AICD leads are unchanged, although the tip of the ventricular lead is incompletely visualized. The heart size and mediastinal contours are stable. The lungs are clear. There is no pleural effusion or pneumothorax. Old rib fractures are present on the left. Telemetry leads overlie the chest. IMPRESSION:  Radiographically stable appearance of the chest. No evidence of active cardiopulmonary process. Electronically Signed   By: WRichardean SaleM.D.   On: 03/22/2021 14:43      Labs: BNP (last 3 results) Recent Labs    08/27/20 1133 09/22/20 1357 03/22/21 1425  BNP 822.9* 2,195.9* 1825.0   Basic Metabolic Panel: Recent Labs  Lab 03/22/21 1338 03/22/21 1841 03/24/21 0511  NA 135  --  138  K 4.4  --  3.5  CL 102  --  103  CO2 24  --  27  GLUCOSE 120*  --  109*  BUN 24*  --  13  CREATININE 1.23 1.12 0.93  CALCIUM 8.9  --  8.5*  MG  --   --  2.2   Liver Function Tests: Recent Labs  Lab 03/22/21 1430  AST 22  ALT 15  ALKPHOS 101  BILITOT 1.2  PROT 7.6  ALBUMIN 4.3   No results for input(s): LIPASE, AMYLASE in the last 168 hours. No results for input(s): AMMONIA in the last 168 hours. CBC: Recent Labs  Lab 03/22/21 1338 03/22/21 1841 03/24/21 0511  WBC 7.0 5.9 5.6  NEUTROABS  --  4.0  --   HGB 13.2 11.8* 12.3*  HCT 39.7 35.2* 37.2*  MCV 97.8 98.3 96.9  PLT 331 279 277   Cardiac Enzymes: No results for input(s): CKTOTAL, CKMB, CKMBINDEX, TROPONINI in the last 168 hours. BNP: Invalid input(s): POCBNP CBG: Recent Labs  Lab 03/22/21 2139 03/23/21 0934 03/23/21 1827 03/23/21 2231 03/24/21 0756  GLUCAP 84 147* 176* 111* 132*   D-Dimer Recent Labs    03/22/21 1430  DDIMER 0.30   Hgb A1c Recent Labs    03/22/21 1841  HGBA1C 7.0*   Lipid Profile No results for input(s): CHOL, HDL, LDLCALC, TRIG, CHOLHDL, LDLDIRECT in the last 72 hours. Thyroid function studies No results for input(s): TSH, T4TOTAL, T3FREE, THYROIDAB in the last 72 hours.  Invalid input(s): FREET3 Anemia work up No results for input(s): VITAMINB12, FOLATE, FERRITIN, TIBC, IRON, RETICCTPCT in the last 72 hours. Urinalysis    Component Value Date/Time   COLORURINE YELLOW (A) 03/22/2021 1556   APPEARANCEUR CLEAR (A) 03/22/2021 1556   LABSPEC 1.011 03/22/2021 1556   PHURINE 5.0  03/22/2021 1556   GLUCOSEU >=500 (A) 03/22/2021 1556   HGBUR NEGATIVE 03/22/2021 1556   BILIRUBINUR NEGATIVE 03/22/2021 1556   KETONESUR NEGATIVE 03/22/2021 1556   PROTEINUR NEGATIVE 03/22/2021 1556   NITRITE NEGATIVE 03/22/2021 1556   LEUKOCYTESUR NEGATIVE 03/22/2021 1556   Sepsis Labs Invalid input(s): PROCALCITONIN,  WBC,  LACTICIDVEN Microbiology Recent Results (from the past 240 hour(s))  Resp Panel by RT-PCR (Flu A&B, Covid) Nasopharyngeal Swab     Status: None   Collection Time: 03/22/21  2:32 PM   Specimen: Nasopharyngeal Swab; Nasopharyngeal(NP) swabs in vial transport medium  Result Value Ref Range Status   SARS Coronavirus 2 by RT PCR NEGATIVE NEGATIVE Final    Comment: (NOTE) SARS-CoV-2 target nucleic acids are NOT DETECTED.  The SARS-CoV-2 RNA is generally detectable in upper respiratory specimens during the acute phase of infection. The lowest concentration of SARS-CoV-2 viral copies this assay can detect is 138 copies/mL. A negative result does not preclude SARS-Cov-2 infection and should not be used as the sole basis for treatment or other patient management decisions. A negative result may occur with  improper specimen collection/handling, submission of specimen other than nasopharyngeal swab, presence of viral mutation(s) within the areas targeted by this assay, and inadequate number of viral copies(<138 copies/mL). A negative result must be combined with clinical observations, patient history, and epidemiological information. The expected result is Negative.  Fact Sheet for Patients:  EntrepreneurPulse.com.au  Fact Sheet for Healthcare Providers:  IncredibleEmployment.be  This test is no t yet approved or cleared by the [place] FDA and  has been authorized for detection and/or diagnosis of SARS-CoV-2 by FDA under an Emergency Use Authorization (EUA). This EUA will remain  in effect (meaning this test can be used)  for the duration of the COVID-19 declaration under Section 564(b)(1) of the Act, 21 U.S.C.section 360bbb-3(b)(1), unless the authorization is terminated  or revoked  sooner.       Influenza A by PCR NEGATIVE NEGATIVE Final   Influenza B by PCR NEGATIVE NEGATIVE Final    Comment: (NOTE) The Xpert Xpress SARS-CoV-2/FLU/RSV plus assay is intended as an aid in the diagnosis of influenza from Nasopharyngeal swab specimens and should not be used as a sole basis for treatment. Nasal washings and aspirates are unacceptable for Xpert Xpress SARS-CoV-2/FLU/RSV testing.  Fact Sheet for Patients: EntrepreneurPulse.com.au  Fact Sheet for Healthcare Providers: IncredibleEmployment.be  This test is not yet approved or cleared by the [place] FDA and has been authorized for detection and/or diagnosis of SARS-CoV-2 by FDA under an Emergency Use Authorization (EUA). This EUA will remain in effect (meaning this test can be used) for the duration of the COVID-19 declaration under Section 564(b)(1) of the Act, 21 U.S.C. section 360bbb-3(b)(1), unless the authorization is terminated or revoked.  Performed at [facility], [address] [place],  [address]      Total time spend on discharging this patient, including the last patient exam, discussing the hospital stay, instructions for ongoing care as it relates to all pertinent caregivers, as well as preparing the medical discharge records, prescriptions, and/or referrals as applicable, is 40 minutes.    Enzo Bi, MD  [facility] 03/24/2021, 10:00 AM

## 2021-03-24 NOTE — Plan of Care (Signed)
  Problem: Activity: Goal: Risk for activity intolerance will decrease Outcome: Progressing   Problem: Coping: Goal: Level of anxiety will decrease Outcome: Progressing   

## 2021-03-28 ENCOUNTER — Ambulatory Visit: Payer: Medicare Other | Admitting: Podiatry

## 2021-03-28 ENCOUNTER — Institutional Professional Consult (permissible substitution): Payer: Medicare Other | Admitting: Radiation Oncology

## 2021-03-29 ENCOUNTER — Ambulatory Visit
Admission: RE | Admit: 2021-03-29 | Discharge: 2021-03-29 | Disposition: A | Discharge status: Home / Self Care | Payer: Medicare Other | Source: Ambulatory Visit | Attending: Radiation Oncology | Admitting: Radiation Oncology

## 2021-03-29 ENCOUNTER — Other Ambulatory Visit: Payer: Self-pay

## 2021-03-29 ENCOUNTER — Encounter: Payer: Self-pay | Admitting: Radiation Oncology

## 2021-03-29 VITALS — BP 127/60 | HR 107 | Temp 97.8°F | Resp 16 | Wt 136.9 lb

## 2021-03-29 DIAGNOSIS — R220 Localized swelling, mass and lump, head: Secondary | ICD-10-CM | POA: Diagnosis present

## 2021-03-29 DIAGNOSIS — R918 Other nonspecific abnormal finding of lung field: Secondary | ICD-10-CM | POA: Insufficient documentation

## 2021-03-29 DIAGNOSIS — Z923 Personal history of irradiation: Secondary | ICD-10-CM | POA: Insufficient documentation

## 2021-03-29 DIAGNOSIS — C7931 Secondary malignant neoplasm of brain: Secondary | ICD-10-CM

## 2021-03-29 NOTE — Progress Notes (Signed)
Radiation Oncology Follow up Note  Name: BRENNON OTTERNESS   Date:   03/29/2021 MRN:  [id number] DOB: 01/22/1956    This 65 y.o. male presents to the clinic today for evaluation of solitary brain metastasis and patient that is now out 9 months from SBRT to his right lower lobe for presumed stage I non-small cell lung cancer.  REFERRING PROVIDER: Idelle Crouch, MD  HPI: Patient is a 65 year old male now out 9 months having completed SBRT to his right lower lobe for presumed stage I non-small cell lung cancer.  Seen today in routine follow-up he is doing fairly well.  He has been having some trouble with balancing issues and hypertension..  As part of that work-up CT scan of his brain showed an 8 mm enhancing lesion in the posterior paramidline right frontal lobe series 4 image 23 concerning for metastasis.  There is mild surrounding edema.  Patient cannot have an MRI based on defibrillator in his anterior chest wall.  COMPLICATIONS OF TREATMENT: none  FOLLOW UP COMPLIANCE: keeps appointments   PHYSICAL EXAM:  BP 127/60 (BP Location: Left Arm, Patient Position: Sitting)   Pulse (!) 107   Temp 97.8 F (36.6 C) (Tympanic)   Resp 16   Wt 136 lb 14.4 oz (62.1 kg)   BMI 23.50 kg/m  Crude visual fields are in within normal range motor or sensory and DTR levels are equal symmetric in upper lower extremities.  Well-developed well-nourished patient in NAD. HEENT reveals PERLA, EOMI, discs not visualized.  Oral cavity is clear. No oral mucosal lesions are identified. Neck is clear without evidence of cervical or supraclavicular adenopathy. Lungs are clear to A&P. Cardiac examination is essentially unremarkable with regular rate and rhythm without murmur rub or thrill. Abdomen is benign with no organomegaly or masses noted. Motor sensory and DTR levels are equal and symmetric in the upper and lower extremities. Cranial nerves II through XII are grossly intact. Proprioception is intact. No peripheral  adenopathy or edema is identified. No motor or sensory levels are noted. Crude visual fields are within normal range.  RADIOLOGY RESULTS: Thin slice CT scans with contrast are reviewed compatible with above-stated findings  PLAN: Again patient has CT evidence of solitary brain metastasis.  He is not candidate for MRI scan I have discussed waiting another 3 months repeating scans are going ahead with single fraction SRS.  Patient and family would like to go ahead with SRS.  Would enlarge our PTV 2 mm to account for not being able to perform MRI scan in his brain.  I believe his thin slice CT scan with contrast is sufficient for our treatment planning purposes.  We will set up simulation after the holidays.  Risks and benefits of treatment including slight possibility of hair loss fatigue skin reaction all were reviewed in detail with the patient and his family.  They will copy my plan well.  I would like to take this opportunity to thank you for allowing me to participate in the care of your patient.Noreene Filbert, MD

## 2021-04-02 ENCOUNTER — Other Ambulatory Visit: Payer: Self-pay

## 2021-04-02 ENCOUNTER — Ambulatory Visit
Admission: RE | Admit: 2021-04-02 | Discharge: 2021-04-02 | Disposition: A | Discharge status: Home / Self Care | Payer: Medicare Other | Source: Ambulatory Visit | Attending: Radiation Oncology | Admitting: Radiation Oncology

## 2021-04-02 ENCOUNTER — Other Ambulatory Visit: Payer: Self-pay | Admitting: *Deleted

## 2021-04-02 DIAGNOSIS — C7931 Secondary malignant neoplasm of brain: Secondary | ICD-10-CM | POA: Diagnosis present

## 2021-04-02 DIAGNOSIS — C3431 Malignant neoplasm of lower lobe, right bronchus or lung: Secondary | ICD-10-CM | POA: Insufficient documentation

## 2021-04-02 MED ORDER — IOHEXOL 300 MG/ML  SOLN
75.0000 mL | Freq: Once | INTRAMUSCULAR | Status: AC | PRN
  Administered 2021-04-02: 75 mL via INTRAVENOUS

## 2021-04-08 DIAGNOSIS — C7931 Secondary malignant neoplasm of brain: Secondary | ICD-10-CM | POA: Diagnosis present

## 2021-04-08 DIAGNOSIS — C3431 Malignant neoplasm of lower lobe, right bronchus or lung: Secondary | ICD-10-CM | POA: Diagnosis not present

## 2021-04-11 ENCOUNTER — Ambulatory Visit
Admission: RE | Admit: 2021-04-11 | Discharge: 2021-04-11 | Disposition: A | Discharge status: Home / Self Care | Payer: Medicare Other | Source: Ambulatory Visit | Attending: Radiation Oncology | Admitting: Radiation Oncology

## 2021-04-11 ENCOUNTER — Other Ambulatory Visit: Payer: Self-pay | Admitting: *Deleted

## 2021-04-11 DIAGNOSIS — R220 Localized swelling, mass and lump, head: Secondary | ICD-10-CM | POA: Diagnosis present

## 2021-04-11 DIAGNOSIS — Z510 Encounter for antineoplastic radiation therapy: Secondary | ICD-10-CM | POA: Diagnosis present

## 2021-04-11 MED ORDER — DEXAMETHASONE 2 MG PO TABS: 4.0000 mg | ORAL_TABLET | Freq: Two times a day (BID) | ORAL | 0 refills | Status: DC

## 2021-04-11 MED ORDER — LANSOPRAZOLE 30 MG PO CPDR: 30.0000 mg | DELAYED_RELEASE_CAPSULE | Freq: Every day | ORAL | 0 refills | Status: DC

## 2021-04-21 NOTE — Progress Notes (Deleted)
Patient ID: NUSSEN PULLIN, male    DOB: [birth year], 65 y.o.   MRN: [id number]   Mr Corniel is a 65 y/o male with a history of CAD, DM, hyperlipidemia, HTN, GERD, COPD, obstructive sleep apnea, chronic heart failure and current tobacco use.   Echo report from 09/23/20 reviewed and showed an EF of <20% along with mild/ moderate MR. Echo report from 11/19/2018 reviewed and showed an EF of 40-45%. Echo report from 04/13/17 reviewed and showed an EF of 30% along with mild MR/TR.  Admitted 03/22/21 due to dizziness, falls and hypotension. Given IVF and medications held. CT head w and wo contrast ordered which showed a lesion likely to be cancer met. Cardiology and neurology consults obtained. Gave 1 dose of IV lasix due to all the IVF he received. Discharged after 2 days.   He presents today for a follow-up visit with a chief complaint of   Now getting his medications pill packed through his pharmacy. Getting meals on wheels delivered daily M-F.   Past Medical History:  Diagnosis Date   AICD (automatic cardioverter/defibrillator) present    Angina at rest [facility])    Aortic atherosclerosis ([place])    CAD (coronary artery disease)    s/p stents in 1245   Chronic systolic CHF (congestive heart failure) (HCC)    Cirrhosis of liver (HCC)    COPD (chronic obstructive pulmonary disease) (HCC)    Diabetes (HCC)    GERD (gastroesophageal reflux disease)    Heart attack ([place])    Hyperlipidemia    Hypertension    Ischemic cardiomyopathy    Paroxysmal ventricular tachycardia    Right lower lobe lung mass    Shortness of breath dyspnea    Sleep apnea    Past Surgical History:  Procedure Laterality Date   CARDIAC PACEMAKER PLACEMENT     cardiac stents     CARDIAC SURGERY     defibrillator and pacemaker   CORONARY ANGIOPLASTY     ESOPHAGOGASTRODUODENOSCOPY N/A 09/19/2014   Procedure: ESOPHAGOGASTRODUODENOSCOPY (EGD);  Surgeon: Lucilla Lame, MD;  Location: [facility] ENDOSCOPY;  Service: Endoscopy;   Laterality: N/A;   ESOPHAGOGASTRODUODENOSCOPY (EGD) WITH PROPOFOL N/A 09/28/2017   Procedure: ESOPHAGOGASTRODUODENOSCOPY (EGD) WITH PROPOFOL;  Surgeon: Virgel Manifold, MD;  Location: [facility] ENDOSCOPY;  Service: Endoscopy;  Laterality: N/A;   ESOPHAGOGASTRODUODENOSCOPY (EGD) WITH PROPOFOL N/A 04/19/2018   Procedure: ESOPHAGOGASTRODUODENOSCOPY (EGD) WITH PROPOFOL with Gastric Mapping;  Surgeon: [name], MD;  Location: [facility] ENDOSCOPY;  Service: Gastroenterology;  Laterality: N/A;   ESOPHAGOGASTRODUODENOSCOPY (EGD) WITH PROPOFOL N/A 12/13/2018   Procedure: ESOPHAGOGASTRODUODENOSCOPY (EGD) WITH PROPOFOL;  Surgeon: [name], MD;  Location: [facility] ENDOSCOPY;  Service: Gastroenterology;  Laterality: N/A;   ESOPHAGOGASTRODUODENOSCOPY (EGD) WITH PROPOFOL N/A 03/31/2019   Procedure: ESOPHAGOGASTRODUODENOSCOPY (EGD) WITH PROPOFOL;  Surgeon: Lin Landsman, MD;  Location: [facility] ENDOSCOPY;  Service: Gastroenterology;  Laterality: N/A;   IMPLANTABLE CARDIOVERTER DEFIBRILLATOR (ICD) GENERATOR CHANGE Left 02/21/2020   Procedure: ICD GENERATOR CHANGE;  Surgeon: Isaias Cowman, MD;  Location: [facility] ORS;  Service: Cardiovascular;  Laterality: Left;   VIDEO BRONCHOSCOPY WITH ENDOBRONCHIAL NAVIGATION N/A 06/22/2020   Procedure: VIDEO BRONCHOSCOPY WITH ENDOBRONCHIAL NAVIGATION;  Surgeon: Ottie Glazier, MD;  Location: [facility] ORS;  Service: Thoracic;  Laterality: N/A;   VIDEO BRONCHOSCOPY WITH ENDOBRONCHIAL ULTRASOUND N/A 06/22/2020   Procedure: VIDEO BRONCHOSCOPY WITH ENDOBRONCHIAL ULTRASOUND;  Surgeon: Ottie Glazier, MD;  Location: [facility] ORS;  Service: Thoracic;  Laterality: N/A;   Family History  Problem Relation Age of Onset   Diabetes Mother  Heart attack Father 42   Social History   Tobacco Use   Smoking status: Every Day    Packs/day: 0.25    Years: 40.00    Pack years: 10.00    Types: Cigarettes   Smokeless tobacco: Never  Substance Use Topics   Alcohol use: No   No Known  Allergies   Review of Systems  Constitutional:  Negative for appetite change, fatigue and fever.  HENT:  Negative for congestion, postnasal drip and sore throat.   Eyes: Negative.   Respiratory:  Positive for cough and shortness of breath. Negative for chest tightness and wheezing.   Cardiovascular:  Negative for chest pain, palpitations and leg swelling.  Gastrointestinal:  Negative for abdominal distention and abdominal pain.  Endocrine: Negative.   Genitourinary: Negative.   Musculoskeletal:  Positive for back pain. Negative for neck pain.  Skin: Negative.   Allergic/Immunologic: Negative.   Neurological:  Positive for dizziness. Negative for light-headedness and headaches.  Hematological:  Negative for adenopathy. Bruises/bleeds easily.  Psychiatric/Behavioral:  Negative for dysphoric mood and sleep disturbance ( sleeping on 2 pillows). The patient is not nervous/anxious.       Physical Exam Vitals reviewed. Exam conducted with a chaperone present (sister).  Constitutional:      Appearance: Normal appearance. He is well-developed.  HENT:     Head: Normocephalic and atraumatic.  Neck:     Vascular: No JVD.  Cardiovascular:     Rate and Rhythm: Normal rate and regular rhythm.  Pulmonary:     Effort: Pulmonary effort is normal. No respiratory distress.     Breath sounds: No wheezing, rhonchi or rales.  Abdominal:     General: There is no distension.     Palpations: Abdomen is soft.     Tenderness: There is no abdominal tenderness.  Musculoskeletal:        General: No tenderness.     Cervical back: Normal range of motion and neck supple.     Right lower leg: No edema.     Left lower leg: No edema.  Skin:    General: Skin is warm and dry.  Neurological:     General: No focal deficit present.     Mental Status: He is alert and oriented to person, place, and time.  Psychiatric:        Mood and Affect: Mood normal.        Behavior: Behavior normal.        Thought  Content: Thought content normal.   Assessment & Plan:  1: Chronic heart failure with reduced ejection fraction- - NYHA class II - euvolemic today - weighing daily; reminded to call for an overnight weight gain of >2 pounds or a weekly weight gain of >5 pounds.  - weight 122.2 pounds since last visit 5 months ago - not adding salt; using [employer]; getting meals on wheels delivered daily M-F and usually eats sandwiches the other meals - saw cardiology Dema Severin) 03/25/21  - currently has pacemaker/defibrillator - on GDMT of entresto, jardiance, metoprolol and spironolactone - BP will not allow for titration - BNP 03/22/21 was 113.4  2: HTN- - BP  - saw PCP ([facility]) 04/01/21 - BMP 03/24/21 reviewed and showed sodium 138, potassium 3.5, creatinine 0.93 and GFR >60  3: Diabetes- - saw endocrinology Honor Junes) 02/27/21 - A1c 03/22/21 was 7.0% - says that his home glucose runs in the   4: COPD/ Tobacco use- - saw pulmonologist Raul Del) 12/21/20 - currently smoking 4 cigarettes daily  5: Lung cancer with possible brain mets- - saw radiation oncologist ([place]) 08/29/20; returns 05/16/21 - had chest CT July 2022 - head CT 04/02/21 showed brain lesion, probable mets   Medication packs were reviewed.

## 2021-04-23 ENCOUNTER — Ambulatory Visit: Payer: Medicare Other | Admitting: Family

## 2021-04-23 ENCOUNTER — Telehealth: Payer: Self-pay | Admitting: Family

## 2021-04-23 NOTE — Telephone Encounter (Signed)
Patient did not show for his [facility] Failure [facility] appointment on 04/23/21. Will attempt to reschedule.

## 2021-05-16 ENCOUNTER — Other Ambulatory Visit: Payer: Self-pay

## 2021-05-16 ENCOUNTER — Ambulatory Visit
Admission: RE | Admit: 2021-05-16 | Discharge: 2021-05-16 | Disposition: A | Discharge status: Home / Self Care | Payer: Medicare Other | Source: Ambulatory Visit | Attending: Radiation Oncology | Admitting: Radiation Oncology

## 2021-05-16 ENCOUNTER — Encounter: Payer: Self-pay | Admitting: Radiation Oncology

## 2021-05-16 VITALS — BP 127/85 | HR 125 | Temp 99.8°F | Resp 16 | Wt 138.3 lb

## 2021-05-16 DIAGNOSIS — R918 Other nonspecific abnormal finding of lung field: Secondary | ICD-10-CM | POA: Diagnosis not present

## 2021-05-16 DIAGNOSIS — C7931 Secondary malignant neoplasm of brain: Secondary | ICD-10-CM | POA: Insufficient documentation

## 2021-05-16 DIAGNOSIS — Z923 Personal history of irradiation: Secondary | ICD-10-CM | POA: Diagnosis not present

## 2021-05-16 NOTE — Progress Notes (Signed)
Radiation Oncology Follow up Note  Name: John Frank   Date:   05/16/2021 MRN:  [id number] DOB: [birth year]    This 66 y.o. male presents to the clinic today for 1 month follow-up status post SRS for tight solitary brain metastasis and the patient 9 months out from SBRT to his right lower lobe for presumed stage I non-small cell lung cancer.  REFERRING PROVIDER: Idelle Crouch, MD  HPI: Patient is a 66 year old male now at 1 month having completed SRS for solitary brain metastasis and patient previously treated to his right lower lobe for presumed stage I non-small cell lung cancer.  Seen today in routine follow-up he is doing well he has been tapered off of his steroid specifically denies headaches any focal neurologic deficits change in visual fields.  His breathing is also stable no hemoptysis cough or chest tightness..  COMPLICATIONS OF TREATMENT: none  FOLLOW UP COMPLIANCE: keeps appointments   PHYSICAL EXAM:  BP 127/85 (BP Location: Left Arm, Patient Position: Sitting)    Pulse (!) 125    Temp 99.8 F (37.7 C) (Tympanic)    Resp 16    Wt 138 lb 4.8 oz (62.7 kg)    BMI 23.74 kg/m  Crude visual fields within normal range motor or sensory and DTR levels are equal and symmetric in upper lower extremities.  Well-developed well-nourished patient in NAD. HEENT reveals PERLA, EOMI, discs not visualized.  Oral cavity is clear. No oral mucosal lesions are identified. Neck is clear without evidence of cervical or supraclavicular adenopathy. Lungs are clear to A&P. Cardiac examination is essentially unremarkable with regular rate and rhythm without murmur rub or thrill. Abdomen is benign with no organomegaly or masses noted. Motor sensory and DTR levels are equal and symmetric in the upper and lower extremities. Cranial nerves II through XII are grossly intact. Proprioception is intact. No peripheral adenopathy or edema is identified. No motor or sensory levels are noted. Crude visual fields are  within normal range.  RADIOLOGY RESULTS: Patient has a CT scan of the chest scheduled for later this month  PLAN: This time I am referring patient back to Dr. Tasia Catchings for her overall clinical input.  He has a CT scan at the end the month I will review when it becomes available.  Have otherwise asked to see him back in 3 months for follow-up.  Should he have any clinical neurologic problems would repeat his MRI scan.  Patient and wife both comprehend my recommendations well.  I would like to take this opportunity to thank you for allowing me to participate in the care of your patient.Noreene Filbert, MD

## 2021-05-18 ENCOUNTER — Emergency Department
Admission: EM | Admit: 2021-05-18 | Discharge: 2021-05-19 | Disposition: A | Discharge status: Home / Self Care | Payer: Medicare Other | Source: Home / Self Care | Attending: Emergency Medicine | Admitting: Emergency Medicine

## 2021-05-18 ENCOUNTER — Emergency Department: Payer: Medicare Other

## 2021-05-18 ENCOUNTER — Other Ambulatory Visit: Payer: Self-pay

## 2021-05-18 DIAGNOSIS — Z85841 Personal history of malignant neoplasm of brain: Secondary | ICD-10-CM | POA: Insufficient documentation

## 2021-05-18 DIAGNOSIS — J189 Pneumonia, unspecified organism: Secondary | ICD-10-CM | POA: Insufficient documentation

## 2021-05-18 DIAGNOSIS — Z20822 Contact with and (suspected) exposure to covid-19: Secondary | ICD-10-CM | POA: Insufficient documentation

## 2021-05-18 DIAGNOSIS — R0602 Shortness of breath: Secondary | ICD-10-CM | POA: Diagnosis not present

## 2021-05-18 DIAGNOSIS — Z85118 Personal history of other malignant neoplasm of bronchus and lung: Secondary | ICD-10-CM | POA: Insufficient documentation

## 2021-05-18 LAB — RESP PANEL BY RT-PCR (FLU A&B, COVID) ARPGX2
Influenza A by PCR: NEGATIVE
Influenza B by PCR: NEGATIVE
SARS Coronavirus 2 by RT PCR: NEGATIVE

## 2021-05-18 LAB — COMPREHENSIVE METABOLIC PANEL
ALT: 19 U/L (ref 0–44)
AST: 31 U/L (ref 15–41)
Albumin: 3.5 g/dL (ref 3.5–5.0)
Alkaline Phosphatase: 77 U/L (ref 38–126)
Anion gap: 11 (ref 5–15)
BUN: 17 mg/dL (ref 8–23)
CO2: 28 mmol/L (ref 22–32)
Calcium: 9.2 mg/dL (ref 8.9–10.3)
Chloride: 98 mmol/L (ref 98–111)
Creatinine, Ser: 1.18 mg/dL (ref 0.61–1.24)
GFR, Estimated: >60 mL/min (ref >60)
Glucose, Bld: 217 mg/dL — ABNORMAL HIGH (ref 70–99)
Potassium: 3.6 mmol/L (ref 3.5–5.1)
Sodium: 137 mmol/L (ref 135–145)
Total Bilirubin: 0.6 mg/dL (ref 0.3–1.2)
Total Protein: 7.3 g/dL (ref 6.5–8.1)

## 2021-05-18 LAB — URINALYSIS, ROUTINE W REFLEX MICROSCOPIC
Bacteria, UA: NONE SEEN
Bilirubin Urine: NEGATIVE
Glucose, UA: >=500 mg/dL — AB
Hgb urine dipstick: NEGATIVE
Ketones, ur: NEGATIVE mg/dL
Leukocytes,Ua: NEGATIVE
Nitrite: NEGATIVE
Protein, ur: NEGATIVE mg/dL
Specific Gravity, Urine: >1.046 — ABNORMAL HIGH (ref 1.005–1.030)
pH: 5 (ref 5.0–8.0)

## 2021-05-18 LAB — CBC WITH DIFFERENTIAL/PLATELET
Abs Immature Granulocytes: 0.07 10*3/uL (ref 0.00–0.07)
Basophils Absolute: 0 10*3/uL (ref 0.0–0.1)
Basophils Relative: 0 %
Eosinophils Absolute: 0.1 10*3/uL (ref 0.0–0.5)
Eosinophils Relative: 1 %
HCT: 34.1 % — ABNORMAL LOW (ref 39.0–52.0)
Hemoglobin: 11.2 g/dL — ABNORMAL LOW (ref 13.0–17.0)
Immature Granulocytes: 1 %
Lymphocytes Relative: 14 %
Lymphs Abs: 1.1 10*3/uL (ref 0.7–4.0)
MCH: 32.3 pg (ref 26.0–34.0)
MCHC: 32.8 g/dL (ref 30.0–36.0)
MCV: 98.3 fL (ref 80.0–100.0)
Monocytes Absolute: 1.1 10*3/uL — ABNORMAL HIGH (ref 0.1–1.0)
Monocytes Relative: 14 %
Neutro Abs: 5.1 10*3/uL (ref 1.7–7.7)
Neutrophils Relative %: 70 %
Platelets: 365 10*3/uL (ref 150–400)
RBC: 3.47 MIL/uL — ABNORMAL LOW (ref 4.22–5.81)
RDW: 18.3 % — ABNORMAL HIGH (ref 11.5–15.5)
WBC: 7.4 10*3/uL (ref 4.0–10.5)
nRBC: 0.3 % — ABNORMAL HIGH (ref 0.0–0.2)

## 2021-05-18 LAB — TROPONIN I (HIGH SENSITIVITY)
Troponin I (High Sensitivity): 19 ng/L — ABNORMAL HIGH (ref <18)
Troponin I (High Sensitivity): 21 ng/L — ABNORMAL HIGH (ref <18)

## 2021-05-18 LAB — LIPASE, BLOOD: Lipase: 26 U/L (ref 11–51)

## 2021-05-18 MED ORDER — IOHEXOL 300 MG/ML  SOLN
100.0000 mL | Freq: Once | INTRAMUSCULAR | Status: AC | PRN
  Administered 2021-05-18: 100 mL via INTRAVENOUS

## 2021-05-18 MED ORDER — ALBUTEROL SULFATE HFA 108 (90 BASE) MCG/ACT IN AERS: 2.0000 | INHALATION_SPRAY | RESPIRATORY_TRACT | Status: DC | PRN

## 2021-05-18 MED ORDER — IPRATROPIUM-ALBUTEROL 0.5-2.5 (3) MG/3ML IN SOLN
3.0000 mL | Freq: Once | RESPIRATORY_TRACT | Status: AC
Start: 2021-05-18 — End: 2021-05-18

## 2021-05-18 MED ORDER — IPRATROPIUM-ALBUTEROL 0.5-2.5 (3) MG/3ML IN SOLN
RESPIRATORY_TRACT | Status: AC
  Administered 2021-05-18: 3 mL via RESPIRATORY_TRACT
  Filled 2021-05-18: qty 3

## 2021-05-18 MED ORDER — ALBUTEROL SULFATE (2.5 MG/3ML) 0.083% IN NEBU: 2.5000 mg | INHALATION_SOLUTION | RESPIRATORY_TRACT | Status: DC | PRN

## 2021-05-18 NOTE — ED Notes (Signed)
Pt requesitng duoneb, states is wheezing. Order secured

## 2021-05-18 NOTE — ED Triage Notes (Signed)
Patient c/o mid abdominal pain and nausea.

## 2021-05-18 NOTE — ED Provider Triage Note (Signed)
Emergency Medicine Provider Triage Evaluation Note  John Frank , a 66 y.o. male  was evaluated in triage.  Pt complains of sharp abdominal pain times a week.  Patient is also had a cough, some shortness of breath.  Symptoms have been ongoing times a week.  No fevers or chills, emesis, diarrhea or constipation.  No urinary changes according to the patient.  Review of Systems  Positive: Sharp epigastric abdominal pain, cough, shortness of breath Negative: Fevers, emesis, diarrhea, constipation, dysuria, hematuria  Physical Exam  BP 127/66    Pulse (!) 104    Temp 98.5 F (36.9 C) (Oral)    Resp 17    Ht 5\' 4"  (1.626 m)    Wt 59.9 kg    SpO2 94%    BMI 22.66 kg/m  Gen:   Awake, no distress   Resp:  Normal effort  MSK:   Moves extremities without difficulty  Other:    Medical Decision Making  Medically screening exam initiated at 6:47 PM.  Appropriate orders placed.  John Frank was informed that the remainder of the evaluation will be completed by another provider, this initial triage assessment does not replace that evaluation, and the importance of remaining in the ED until their evaluation is complete.  Patient presented to the emergency department with sharp epigastric pain, cough, shortness of breath.  Symptoms times a week.  Patient will have COVID swab, urinalysis, labs, chest x-ray, CT scan of the abdomen pelvis at this time.   Darletta Moll, PA-C 05/18/21 1847

## 2021-05-19 MED ORDER — LEVOFLOXACIN 750 MG PO TABS: 750.0000 mg | ORAL_TABLET | Freq: Every day | ORAL | 0 refills | Status: DC

## 2021-05-19 MED ORDER — LEVOFLOXACIN 750 MG PO TABS
750.0000 mg | ORAL_TABLET | Freq: Once | ORAL | Status: AC
  Administered 2021-05-19: 750 mg via ORAL
  Filled 2021-05-19: qty 1

## 2021-05-19 NOTE — ED Notes (Signed)
Provider printed rx for Levaquin- given to daughter.

## 2021-05-19 NOTE — Discharge Instructions (Signed)
As we discussed, we believe your symptoms are caused by pneumonia.  You were given an antibiotic and encourage you to take the full course of treatment.  Dr. Grayland Ormond will help arrange a follow-up appointment at the cancer center this coming week.  They will most likely contact you to let you know the date and time of that appointment.  Continue taking your other regular medications.  If at any point you develop new or worsening symptoms that concern you, please return immediately to the emergency department.

## 2021-05-19 NOTE — ED Provider Notes (Addendum)
[facility] Provider Note    Event Date/Time   First MD Initiated Contact with Patient 05/18/21 2359     (approximate)   History   Abdominal Pain   HPI  John Frank is a 66 y.o. male with medical history includes but is not limited to probable non-small cell lung cancer with metastases to the brain having just recently undergone radiation therapy but who is not on chemotherapy.  He presents for evaluation of upper abdominal pain.  Symptoms of been present for a couple of days.  He has had some nausea but no vomiting.  He reports that he is still eating and drinking.  Nothing particular seems to make the symptoms better and exertion makes it worse.  He is also had a very thick cough productive of a lot of phlegm.  He does not feel short of breath unless he is coughing heavily.  He has no supplemental oxygen requirement at baseline.  He has not had a fever or sore throat.  He is scheduled to see Dr. Tasia Catchings in the oncology clinic but has not yet had his first visit; it is scheduled for next month.     Physical Exam   Triage Vital Signs: ED Triage Vitals [05/18/21 1843]  Enc Vitals Group     BP 127/66     Pulse Rate (!) 104     Resp 17     Temp 98.5 F (36.9 C)     Temp Source Oral     SpO2 94 %     Weight 59.9 kg (132 lb)     Height 1.626 m (5\' 4" )     Head Circumference      Peak Flow      Pain Score 7     Pain Loc      Pain Edu?      Excl. in [place]?     Most recent vital signs: Vitals:   05/19/21 0000 05/19/21 0115  BP:  123/70  Pulse:  (!) 115  Resp:  20  Temp:  98.6 F (37 C)  SpO2: 95% 95%     General: Awake, no distress.  CV:  Good peripheral perfusion.  Resp:  Normal effort.  Frequent productive cough.  Lungs are clear to auscultation. Abd:  No distention.  No tenderness to palpation throughout the abdomen including the epigastrium or right upper quadrant.  No rebound and no guarding.   ED Results / Procedures / Treatments    Labs (all labs ordered are listed, but only abnormal results are displayed) Labs Reviewed  COMPREHENSIVE METABOLIC PANEL - Abnormal; Notable for the following components:      Result Value   Glucose, Bld 217 (*)    All other components within normal limits  CBC WITH DIFFERENTIAL/PLATELET - Abnormal; Notable for the following components:   RBC 3.47 (*)    Hemoglobin 11.2 (*)    HCT 34.1 (*)    RDW 18.3 (*)    nRBC 0.3 (*)    Monocytes Absolute 1.1 (*)    All other components within normal limits  URINALYSIS, ROUTINE W REFLEX MICROSCOPIC - Abnormal; Notable for the following components:   Color, Urine YELLOW (*)    APPearance CLEAR (*)    Specific Gravity, Urine >1.046 (*)    Glucose, UA >=500 (*)    All other components within normal limits  TROPONIN I (HIGH SENSITIVITY) - Abnormal; Notable for the following components:   Troponin I (High Sensitivity) 19 (*)  All other components within normal limits  TROPONIN I (HIGH SENSITIVITY) - Abnormal; Notable for the following components:   Troponin I (High Sensitivity) 21 (*)    All other components within normal limits  RESP PANEL BY RT-PCR (FLU A&B, COVID) ARPGX2  LIPASE, BLOOD     EKG  ED ECG REPORT I, Hinda Kehr, the attending physician, personally viewed and interpreted this ECG.  Date: 05/18/2021 EKG Time: 18: 59 Rate: 102 Rhythm: Sinus tachycardia QRS Axis: normal Intervals: Incomplete left bundle branch block ST/T Wave abnormalities: Non-specific ST segment / T-wave changes, but no clear evidence of acute ischemia. Narrative Interpretation: no definitive evidence of acute ischemia; does not meet STEMI criteria.    RADIOLOGY I personally reviewed the patient's CT of the abdomen and pelvis as well as his chest x-ray.  I agree with the presence of an infectious or inflammatory process in his lung bases as per the radiology report, but there is no evidence of acute intra-abdominal  process.    PROCEDURES:  Critical Care performed: No  Procedures   MEDICATIONS ORDERED IN ED: Medications  albuterol (PROVENTIL) (2.5 MG/3ML) 0.083% nebulizer solution 2.5 mg (has no administration in time range)  iohexol (OMNIPAQUE) 300 MG/ML solution 100 mL (100 mLs Intravenous Contrast Given 05/18/21 1959)  ipratropium-albuterol (DUONEB) 0.5-2.5 (3) MG/3ML nebulizer solution 3 mL (3 mLs Nebulization Given 05/18/21 2159)  levofloxacin (LEVAQUIN) tablet 750 mg (750 mg Oral Given 05/19/21 0059)     IMPRESSION / MDM / ASSESSMENT AND PLAN / ED COURSE  I reviewed the triage vital signs and the nursing notes.                              Differential diagnosis includes, but is not limited to, SBO/ileus, diverticulitis, appendicitis, hernia complication, pneumonia, COPD.  Patient's vital signs are stable although he does have some mild tachycardia.  He says he is eating and drinking okay even though he has had some nausea.  Although the presentation was for abdominal pain, his symptoms are more consistent with respiratory problems, specifically an infectious process causing a cough.  He has no tenderness to palpation of the abdomen which is reassuring and I personally reviewed his CT scan and there is no acute intra-abdominal process.  However he does appear to have bilateral lower lobe pneumonia.  I reviewed his chest x-ray as well and it was unremarkable.  Labs ordered include urinalysis, high-sensitivity troponin x2, respiratory viral panel, lipase, and comprehensive metabolic panel.  Comprehensive metabolic panel, CBC, urinalysis, and respiratory viral panel and lipase are all within normal limits.  High-sensitivity troponin is very slightly elevated at 21 but not of clinical concern, particularly given the lack of chest pain.  I updated the patient and his sister regarding the findings including the probable pneumonia.  The patient does not want to stay in the hospital if not absolutely  necessary.  He has no oxygen requirement and is comfortable except when coughing.  Given the patient's comorbidities, I consulted by phone with Dr. Grayland Ormond with medical oncology.  I explained the clinical scenario and he said that under those circumstances, if he saw the patient in clinic, he would try outpatient antibiotics first.  Even though we typically try to avoid it, he agreed with my plan that Levaquin 750 mg daily would be a good choice for the patient for the broad coverage.  He will facilitate a close follow-up visit this coming week with a  provider in the cancer center.  I gave a first dose of Levaquin to the patient and updated the patient and his sister according to the plan.  I strongly considered admission, but as per the discussion above, I do not think there is a clear indication for admission versus trial of outpatient antibiotics.  The patient and his sister know to return to the emergency department if he is not getting better after several days or if his symptoms worsen or if he develops new concerning symptoms.           FINAL CLINICAL IMPRESSION(S) / ED DIAGNOSES   Final diagnoses:  Community acquired pneumonia, unspecified laterality     Rx / DC Orders   ED Discharge Orders          Ordered    levofloxacin (LEVAQUIN) 750 MG tablet  Daily        05/19/21 0201             Note:  This document was prepared using Dragon voice recognition software and may include unintentional dictation errors.   Hinda Kehr, MD 05/19/21 Francene Castle    Hinda Kehr, MD 05/19/21 [phone]

## 2021-05-19 NOTE — ED Notes (Addendum)
Pt reporting SOB and coughing. Requested O2- not normally on O2 at home but provided nasal cannula with 2L/min O2 for patient comfort. Patient saturating at 94% on that level. Provided water to pt.

## 2021-05-20 ENCOUNTER — Telehealth: Payer: Self-pay | Admitting: Oncology

## 2021-05-20 NOTE — Telephone Encounter (Signed)
Pt was in ED over the weekend and was told to see his MD at cancer center this week. Call back at [phone]

## 2021-05-20 NOTE — Telephone Encounter (Signed)
Per MD, schedule pt to see NP this week

## 2021-05-21 ENCOUNTER — Emergency Department: Payer: Medicare Other

## 2021-05-21 ENCOUNTER — Other Ambulatory Visit: Payer: Self-pay

## 2021-05-21 ENCOUNTER — Inpatient Hospital Stay
Admission: EM | Admit: 2021-05-21 | Discharge: 2021-05-23 | DRG: 194 | Disposition: A | Discharge status: Home / Self Care | Payer: Medicare Other | Attending: Family Medicine | Admitting: Family Medicine

## 2021-05-21 DIAGNOSIS — R0602 Shortness of breath: Secondary | ICD-10-CM

## 2021-05-21 DIAGNOSIS — I5022 Chronic systolic (congestive) heart failure: Secondary | ICD-10-CM | POA: Diagnosis not present

## 2021-05-21 DIAGNOSIS — Z8249 Family history of ischemic heart disease and other diseases of the circulatory system: Secondary | ICD-10-CM

## 2021-05-21 DIAGNOSIS — Z9049 Acquired absence of other specified parts of digestive tract: Secondary | ICD-10-CM

## 2021-05-21 DIAGNOSIS — F17210 Nicotine dependence, cigarettes, uncomplicated: Secondary | ICD-10-CM | POA: Diagnosis present

## 2021-05-21 DIAGNOSIS — A419 Sepsis, unspecified organism: Secondary | ICD-10-CM

## 2021-05-21 DIAGNOSIS — Z955 Presence of coronary angioplasty implant and graft: Secondary | ICD-10-CM

## 2021-05-21 DIAGNOSIS — M5030 Other cervical disc degeneration, unspecified cervical region: Secondary | ICD-10-CM | POA: Diagnosis present

## 2021-05-21 DIAGNOSIS — J440 Chronic obstructive pulmonary disease with acute lower respiratory infection: Secondary | ICD-10-CM | POA: Diagnosis present

## 2021-05-21 DIAGNOSIS — S51012A Laceration without foreign body of left elbow, initial encounter: Secondary | ICD-10-CM | POA: Diagnosis present

## 2021-05-21 DIAGNOSIS — I10 Essential (primary) hypertension: Secondary | ICD-10-CM | POA: Diagnosis not present

## 2021-05-21 DIAGNOSIS — J449 Chronic obstructive pulmonary disease, unspecified: Secondary | ICD-10-CM | POA: Diagnosis present

## 2021-05-21 DIAGNOSIS — Z7982 Long term (current) use of aspirin: Secondary | ICD-10-CM

## 2021-05-21 DIAGNOSIS — I5042 Chronic combined systolic (congestive) and diastolic (congestive) heart failure: Secondary | ICD-10-CM | POA: Diagnosis present

## 2021-05-21 DIAGNOSIS — N179 Acute kidney failure, unspecified: Secondary | ICD-10-CM | POA: Diagnosis present

## 2021-05-21 DIAGNOSIS — R0902 Hypoxemia: Secondary | ICD-10-CM | POA: Diagnosis present

## 2021-05-21 DIAGNOSIS — Z20822 Contact with and (suspected) exposure to covid-19: Secondary | ICD-10-CM | POA: Diagnosis present

## 2021-05-21 DIAGNOSIS — I700 Atherosclerosis of aorta: Secondary | ICD-10-CM | POA: Diagnosis present

## 2021-05-21 DIAGNOSIS — Z79899 Other long term (current) drug therapy: Secondary | ICD-10-CM

## 2021-05-21 DIAGNOSIS — Z923 Personal history of irradiation: Secondary | ICD-10-CM

## 2021-05-21 DIAGNOSIS — R0600 Dyspnea, unspecified: Secondary | ICD-10-CM | POA: Diagnosis present

## 2021-05-21 DIAGNOSIS — Z720 Tobacco use: Secondary | ICD-10-CM | POA: Diagnosis not present

## 2021-05-21 DIAGNOSIS — E119 Type 2 diabetes mellitus without complications: Secondary | ICD-10-CM | POA: Diagnosis present

## 2021-05-21 DIAGNOSIS — C7931 Secondary malignant neoplasm of brain: Secondary | ICD-10-CM | POA: Diagnosis present

## 2021-05-21 DIAGNOSIS — Z23 Encounter for immunization: Secondary | ICD-10-CM

## 2021-05-21 DIAGNOSIS — J189 Pneumonia, unspecified organism: Principal | ICD-10-CM | POA: Diagnosis present

## 2021-05-21 DIAGNOSIS — R29898 Other symptoms and signs involving the musculoskeletal system: Secondary | ICD-10-CM | POA: Diagnosis present

## 2021-05-21 DIAGNOSIS — E872 Acidosis, unspecified: Secondary | ICD-10-CM | POA: Diagnosis present

## 2021-05-21 DIAGNOSIS — K7460 Unspecified cirrhosis of liver: Secondary | ICD-10-CM | POA: Diagnosis present

## 2021-05-21 DIAGNOSIS — Z7984 Long term (current) use of oral hypoglycemic drugs: Secondary | ICD-10-CM

## 2021-05-21 DIAGNOSIS — I2510 Atherosclerotic heart disease of native coronary artery without angina pectoris: Secondary | ICD-10-CM | POA: Diagnosis present

## 2021-05-21 DIAGNOSIS — W19XXXA Unspecified fall, initial encounter: Secondary | ICD-10-CM | POA: Diagnosis present

## 2021-05-21 DIAGNOSIS — E876 Hypokalemia: Secondary | ICD-10-CM | POA: Diagnosis present

## 2021-05-21 DIAGNOSIS — K219 Gastro-esophageal reflux disease without esophagitis: Secondary | ICD-10-CM | POA: Diagnosis present

## 2021-05-21 DIAGNOSIS — I255 Ischemic cardiomyopathy: Secondary | ICD-10-CM | POA: Diagnosis present

## 2021-05-21 DIAGNOSIS — C3431 Malignant neoplasm of lower lobe, right bronchus or lung: Secondary | ICD-10-CM | POA: Diagnosis present

## 2021-05-21 DIAGNOSIS — I252 Old myocardial infarction: Secondary | ICD-10-CM

## 2021-05-21 DIAGNOSIS — Z95810 Presence of automatic (implantable) cardiac defibrillator: Secondary | ICD-10-CM | POA: Diagnosis not present

## 2021-05-21 DIAGNOSIS — M25522 Pain in left elbow: Secondary | ICD-10-CM | POA: Diagnosis present

## 2021-05-21 DIAGNOSIS — E860 Dehydration: Secondary | ICD-10-CM | POA: Diagnosis present

## 2021-05-21 DIAGNOSIS — E785 Hyperlipidemia, unspecified: Secondary | ICD-10-CM | POA: Diagnosis present

## 2021-05-21 DIAGNOSIS — Z833 Family history of diabetes mellitus: Secondary | ICD-10-CM

## 2021-05-21 DIAGNOSIS — I110 Hypertensive heart disease with heart failure: Secondary | ICD-10-CM | POA: Diagnosis present

## 2021-05-21 DIAGNOSIS — R918 Other nonspecific abnormal finding of lung field: Secondary | ICD-10-CM | POA: Diagnosis not present

## 2021-05-21 DIAGNOSIS — Z7951 Long term (current) use of inhaled steroids: Secondary | ICD-10-CM

## 2021-05-21 DIAGNOSIS — C3490 Malignant neoplasm of unspecified part of unspecified bronchus or lung: Secondary | ICD-10-CM

## 2021-05-21 LAB — BASIC METABOLIC PANEL
Anion gap: 13 (ref 5–15)
BUN: 22 mg/dL (ref 8–23)
CO2: 26 mmol/L (ref 22–32)
Calcium: 9.2 mg/dL (ref 8.9–10.3)
Chloride: 97 mmol/L — ABNORMAL LOW (ref 98–111)
Creatinine, Ser: 1.35 mg/dL — ABNORMAL HIGH (ref 0.61–1.24)
GFR, Estimated: 58 mL/min — ABNORMAL LOW (ref >60)
Glucose, Bld: 196 mg/dL — ABNORMAL HIGH (ref 70–99)
Potassium: 3.4 mmol/L — ABNORMAL LOW (ref 3.5–5.1)
Sodium: 136 mmol/L (ref 135–145)

## 2021-05-21 LAB — CBC
HCT: 36.9 % — ABNORMAL LOW (ref 39.0–52.0)
Hemoglobin: 12.1 g/dL — ABNORMAL LOW (ref 13.0–17.0)
MCH: 31.6 pg (ref 26.0–34.0)
MCHC: 32.8 g/dL (ref 30.0–36.0)
MCV: 96.3 fL (ref 80.0–100.0)
Platelets: 398 10*3/uL (ref 150–400)
RBC: 3.83 MIL/uL — ABNORMAL LOW (ref 4.22–5.81)
RDW: 17.9 % — ABNORMAL HIGH (ref 11.5–15.5)
WBC: 11.6 10*3/uL — ABNORMAL HIGH (ref 4.0–10.5)
nRBC: 0.4 % — ABNORMAL HIGH (ref 0.0–0.2)

## 2021-05-21 LAB — LACTIC ACID, PLASMA
Lactic Acid, Venous: 2.9 mmol/L (ref 0.5–1.9)
Lactic Acid, Venous: 1.5 mmol/L (ref 0.5–1.9)

## 2021-05-21 LAB — CREATININE, SERUM
Creatinine, Ser: 1.45 mg/dL — ABNORMAL HIGH (ref 0.61–1.24)
GFR, Estimated: 53 mL/min — ABNORMAL LOW (ref >60)

## 2021-05-21 LAB — BLOOD GAS, VENOUS
Acid-Base Excess: 4.1 mmol/L — ABNORMAL HIGH (ref 0.0–2.0)
Bicarbonate: 29.2 mmol/L — ABNORMAL HIGH (ref 20.0–28.0)
O2 Saturation: 85 %
Patient temperature: 37
pCO2, Ven: 45 mmHg (ref 44.0–60.0)
pH, Ven: 7.42 (ref 7.250–7.430)
pO2, Ven: 49 mmHg — ABNORMAL HIGH (ref 32.0–45.0)

## 2021-05-21 MED ORDER — SPIRONOLACTONE 25 MG PO TABS: 12.5000 mg | ORAL_TABLET | Freq: Every day | ORAL | Status: DC

## 2021-05-21 MED ORDER — IPRATROPIUM-ALBUTEROL 0.5-2.5 (3) MG/3ML IN SOLN
3.0000 mL | Freq: Once | RESPIRATORY_TRACT | Status: AC
  Administered 2021-05-21: 3 mL via RESPIRATORY_TRACT
  Filled 2021-05-21: qty 3

## 2021-05-21 MED ORDER — BUPROPION HCL 75 MG PO TABS
75.0000 mg | ORAL_TABLET | Freq: Every day | ORAL | Status: DC
  Administered 2021-05-22 – 2021-05-23 (×2): 75 mg via ORAL
  Filled 2021-05-21 (×2): qty 1

## 2021-05-21 MED ORDER — AMLODIPINE BESYLATE 5 MG PO TABS: 5.0000 mg | ORAL_TABLET | Freq: Every day | ORAL | Status: DC

## 2021-05-21 MED ORDER — SODIUM CHLORIDE 0.9 % IV SOLN
2.0000 g | Freq: Once | INTRAVENOUS | Status: AC
  Administered 2021-05-21: 2 g via INTRAVENOUS
  Filled 2021-05-21: qty 2

## 2021-05-21 MED ORDER — INSULIN ASPART 100 UNIT/ML IJ SOLN
0.0000 [IU] | Freq: Three times a day (TID) | INTRAMUSCULAR | Status: DC
  Administered 2021-05-22 – 2021-05-23 (×3): 2 [IU] via SUBCUTANEOUS
  Filled 2021-05-21 (×4): qty 1

## 2021-05-21 MED ORDER — SODIUM CHLORIDE 0.9 % IV SOLN
2.0000 g | Freq: Two times a day (BID) | INTRAVENOUS | Status: DC
  Administered 2021-05-22 (×2): 2 g via INTRAVENOUS
  Filled 2021-05-21 (×4): qty 2

## 2021-05-21 MED ORDER — PANTOPRAZOLE SODIUM 40 MG PO TBEC
40.0000 mg | DELAYED_RELEASE_TABLET | Freq: Every day | ORAL | Status: DC
  Administered 2021-05-22: 40 mg via ORAL
  Filled 2021-05-21: qty 1

## 2021-05-21 MED ORDER — BACITRACIN ZINC 500 UNIT/GM EX OINT
TOPICAL_OINTMENT | Freq: Two times a day (BID) | CUTANEOUS | Status: DC
  Administered 2021-05-21: 4 via TOPICAL
  Filled 2021-05-21 (×2): qty 0.9
  Filled 2021-05-21: qty 3.6

## 2021-05-21 MED ORDER — VANCOMYCIN HCL 500 MG/100ML IV SOLN
500.0000 mg | Freq: Once | INTRAVENOUS | Status: AC
  Administered 2021-05-21: 500 mg via INTRAVENOUS
  Filled 2021-05-21: qty 100

## 2021-05-21 MED ORDER — ATORVASTATIN CALCIUM 20 MG PO TABS
40.0000 mg | ORAL_TABLET | Freq: Every day | ORAL | Status: DC
  Administered 2021-05-22: 40 mg via ORAL
  Filled 2021-05-21: qty 2

## 2021-05-21 MED ORDER — SODIUM CHLORIDE 0.9 % IV BOLUS
500.0000 mL | Freq: Once | INTRAVENOUS | Status: AC
  Administered 2021-05-21: 500 mL via INTRAVENOUS

## 2021-05-21 MED ORDER — VANCOMYCIN HCL IN DEXTROSE 1-5 GM/200ML-% IV SOLN
1000.0000 mg | Freq: Once | INTRAVENOUS | Status: AC
  Administered 2021-05-21: 1000 mg via INTRAVENOUS
  Filled 2021-05-21 (×2): qty 200

## 2021-05-21 MED ORDER — IOHEXOL 350 MG/ML SOLN
75.0000 mL | Freq: Once | INTRAVENOUS | Status: AC | PRN
  Administered 2021-05-21: 75 mL via INTRAVENOUS

## 2021-05-21 MED ORDER — SODIUM CHLORIDE 0.9 % IV BOLUS: 1000.0000 mL | Freq: Once | INTRAVENOUS | Status: DC

## 2021-05-21 MED ORDER — TETANUS-DIPHTH-ACELL PERTUSSIS 5-2.5-18.5 LF-MCG/0.5 IM SUSY
0.5000 mL | PREFILLED_SYRINGE | Freq: Once | INTRAMUSCULAR | Status: AC
  Administered 2021-05-21: 0.5 mL via INTRAMUSCULAR
  Filled 2021-05-21: qty 0.5

## 2021-05-21 MED ORDER — ALBUTEROL SULFATE (2.5 MG/3ML) 0.083% IN NEBU
2.5000 mg | INHALATION_SOLUTION | Freq: Four times a day (QID) | RESPIRATORY_TRACT | Status: DC | PRN
  Administered 2021-05-22 (×2): 2.5 mg via RESPIRATORY_TRACT
  Filled 2021-05-21 (×2): qty 3

## 2021-05-21 MED ORDER — SODIUM CHLORIDE 0.9 % IV SOLN
500.0000 mg | Freq: Once | INTRAVENOUS | Status: AC
  Administered 2021-05-21: 500 mg via INTRAVENOUS
  Filled 2021-05-21: qty 5

## 2021-05-21 MED ORDER — ASPIRIN EC 81 MG PO TBEC
81.0000 mg | DELAYED_RELEASE_TABLET | Freq: Every day | ORAL | Status: DC
  Administered 2021-05-22 – 2021-05-23 (×2): 81 mg via ORAL
  Filled 2021-05-21 (×2): qty 1

## 2021-05-21 MED ORDER — GUAIFENESIN ER 600 MG PO TB12
600.0000 mg | ORAL_TABLET | Freq: Two times a day (BID) | ORAL | Status: DC
  Administered 2021-05-21 – 2021-05-23 (×4): 600 mg via ORAL
  Filled 2021-05-21 (×4): qty 1

## 2021-05-21 MED ORDER — SODIUM CHLORIDE 0.9 % IV SOLN: 500.0000 mg | Freq: Every day | INTRAVENOUS | Status: DC

## 2021-05-21 MED ORDER — NITROGLYCERIN 0.4 MG SL SUBL: 0.4000 mg | SUBLINGUAL_TABLET | SUBLINGUAL | Status: DC | PRN

## 2021-05-21 MED ORDER — IPRATROPIUM-ALBUTEROL 0.5-2.5 (3) MG/3ML IN SOLN
3.0000 mL | Freq: Four times a day (QID) | RESPIRATORY_TRACT | Status: DC
  Administered 2021-05-21 – 2021-05-23 (×7): 3 mL via RESPIRATORY_TRACT
  Filled 2021-05-21 (×7): qty 3

## 2021-05-21 MED ORDER — METOPROLOL SUCCINATE ER 50 MG PO TB24: 50.0000 mg | ORAL_TABLET | Freq: Every day | ORAL | Status: DC

## 2021-05-21 MED ORDER — HEPARIN SODIUM (PORCINE) 5000 UNIT/ML IJ SOLN
5000.0000 [IU] | Freq: Three times a day (TID) | INTRAMUSCULAR | Status: DC
  Administered 2021-05-21 – 2021-05-22 (×2): 5000 [IU] via SUBCUTANEOUS
  Filled 2021-05-21 (×2): qty 1

## 2021-05-21 MED ORDER — FLUTICASONE FUROATE-VILANTEROL 200-25 MCG/ACT IN AEPB
1.0000 | INHALATION_SPRAY | Freq: Every day | RESPIRATORY_TRACT | Status: DC
  Administered 2021-05-22 – 2021-05-23 (×2): 1 via RESPIRATORY_TRACT
  Filled 2021-05-21: qty 28

## 2021-05-21 NOTE — ED Triage Notes (Signed)
Pt to ED for PNA from 1/7. States still not feeling well, shob, cough. Denies fever.  States fell out of bed this morning and hit left arm.  Denies hitting head. No blood thinner use Skin tear noted to left elbow

## 2021-05-21 NOTE — Consult Note (Signed)
PHARMACY -  BRIEF ANTIBIOTIC NOTE   Pharmacy has received consult(s) for PNA from an ED provider.  The patient's profile has been reviewed for ht/wt/allergies/indication/available labs.    One time order(s) placed for cefepime and vancomycin   Further antibiotics/pharmacy consults should be ordered by admitting physician if indicated.                       Thank you, Oswald Hillock 05/21/2021  6:01 PM

## 2021-05-21 NOTE — ED Notes (Signed)
Pt missed urinal soiling gown and underwear. Pt placed in a brief and new gown

## 2021-05-21 NOTE — ED Notes (Signed)
Lab contacted for blood cultures for second time.

## 2021-05-21 NOTE — Consult Note (Signed)
CODE SEPSIS - PHARMACY COMMUNICATION  **Broad Spectrum Antibiotics should be administered within 1 hour of Sepsis diagnosis**  Time Code Sepsis Called/Page Received: 1748  Antibiotics Ordered: cefepime, azithromycin, vancomycin  Time of 1st antibiotic administration: 1855  Additional action taken by pharmacy: messaged RN   If necessary, Name of Provider/Nurse Contacted: Leta Baptist ,PharmD Clinical Pharmacist  05/21/2021  7:02 PM

## 2021-05-21 NOTE — ED Provider Notes (Signed)
[facility] Provider Note    Event Date/Time   First MD Initiated Contact with Patient 05/21/21 1706     (approximate)   History   Pneumonia   HPI  John Frank is a 66 y.o. male non-small cell lung cancer with mets to the brain having recently undergone radiation not been on chemotherapy, CHF with EF of 20% with ICD, COPD who comes in with concerns for pneumonia.  Patient reports that he is not feeling well with shortness of breath, cough.  Patient reports being compliant with his antibiotics however he is continue to have shortness of breath, coughing.  Patient reports increasing weakness and dizziness.  Patient reports that he has been trying to ambulate and has had some near falls.  He denies hitting his head but does have a skin tear on his left elbow and his left knee.  Patient reports that he lives alone.  Patient does report a little bit of weakness in his left hand as well.  He states that this is happening prior to his fall.  I reviewed the note from 05/18/2021 patient was in the ER and diagnosed with bilateral pneumonia.  At that time patient did not want to stay in the hospital unless absolutely necessary.  He had no oxygen requirement.  Patient was started on Levaquin 750 daily  I also reviewed a discharge summary from 03/24/2021 here at [facility] where patient had come in for dizziness.  At that time is when he had a CT head with and without contrast that did show concern for metastatic cancer.      Physical Exam   Triage Vital Signs: ED Triage Vitals  Enc Vitals Group     BP 05/21/21 1307 96/75     Pulse Rate 05/21/21 1307 (!) 114     Resp 05/21/21 1307 20     Temp 05/21/21 1307 98.4 F (36.9 C)     Temp Source 05/21/21 1307 Oral     SpO2 05/21/21 1307 94 %     Weight 05/21/21 1301 135 lb (61.2 kg)     Height 05/21/21 1301 _0  (1.626 m)     Head Circumference --      Peak Flow --      Pain Score 05/21/21 1301 10     Pain Loc --       Pain Edu? --      Excl. in [place]? --     Most recent vital signs: Vitals:   05/21/21 1307  BP: 96/75  Pulse: (!) 114  Resp: 20  Temp: 98.4 F (36.9 C)  SpO2: 94%     General: Awake, no distress.  Appears older than stated age CV:  Good peripheral perfusion.  ICD Resp:  Normal effort.  Mild wheezing bilaterally Abd:  No distention.  Soft and nontender Other:  No edema noted in his legs. Neuro: Patient has pretty good grip strength bilaterally but he reports a little bit of weakness of the left hand.  Otherwise patient appears neuro intact.  ED Results / Procedures / Treatments   Labs (all labs ordered are listed, but only abnormal results are displayed) Labs Reviewed  LACTIC ACID, PLASMA - Abnormal; Notable for the following components:      Result Value   Lactic Acid, Venous 2.9 (*)    All other components within normal limits  CBC - Abnormal; Notable for the following components:   WBC 11.6 (*)    RBC 3.83 (*)  Hemoglobin 12.1 (*)    HCT 36.9 (*)    RDW 17.9 (*)    nRBC 0.4 (*)    All other components within normal limits  BASIC METABOLIC PANEL - Abnormal; Notable for the following components:   Potassium 3.4 (*)    Chloride 97 (*)    Glucose, Bld 196 (*)    Creatinine, Ser 1.35 (*)    GFR, Estimated 58 (*)    All other components within normal limits  LACTIC ACID, PLASMA     EKG  My interpretation of EKG:  Sinus tachycardia rate of 113 without any ST elevation, T wave inversions in 3, incomplete right bundle branch block.  RADIOLOGY I have reviewed the xray personally and agree with radiology read with x-ray concerning for bilateral pneumonia X-ray with possible radial head fracture but no effusion noted.   PROCEDURES:  Critical Care performed: Yes, see critical care procedure note(s)  .1-3 Lead EKG Interpretation Performed by: Vanessa Daniel, MD Authorized by: Vanessa [place], MD     Interpretation: abnormal     ECG rate:  114s   ECG rate  assessment: tachycardic     Rhythm: sinus tachycardia     Ectopy: none     Conduction: normal   .Critical Care Performed by: Vanessa Monowi, MD Authorized by: Vanessa Bradford, MD   Critical care provider statement:    Critical care time (minutes):  30   Critical care was necessary to treat or prevent imminent or life-threatening deterioration of the following conditions:  Sepsis   Critical care was time spent personally by me on the following activities:  Development of treatment plan with patient or surrogate, discussions with consultants, evaluation of patient's response to treatment, examination of patient, ordering and review of laboratory studies, ordering and review of radiographic studies, ordering and performing treatments and interventions, pulse oximetry, re-evaluation of patient's condition and review of [place] ED: Medications  ipratropium-albuterol (DUONEB) 0.5-2.5 (3) MG/3ML nebulizer solution 3 mL (has no administration in time range)  Tdap (BOOSTRIX) injection 0.5 mL (has no administration in time range)  sodium chloride 0.9 % bolus 500 mL (has no administration in time range)  vancomycin (VANCOCIN) IVPB 1000 mg/200 mL premix (has no administration in time range)  ceFEPIme (MAXIPIME) 2 g in sodium chloride 0.9 % 100 mL IVPB (has no administration in time range)  azithromycin (ZITHROMAX) 500 mg in sodium chloride 0.9 % 250 mL IVPB (has no administration in time range)     IMPRESSION / MDM / [place] / ED COURSE  I reviewed the triage vital signs and the nursing notes.  Patient has significant comorbidities including CHF, cancer with known mets to the brain who comes in with concerns for weakness and worsening shortness of breath in the setting of known pneumonia.  Patient has failed outpatient oral antibiotics.  Labs ordered evaluate for [employer], AKI.  Given the report of a little bit of hand weakness will get CT head to evaluate for  worsening mets, CT cervical evaluate for cervical fracture and x-ray to evaluate for fracture of the arm.  We will also get CT PE given patient's cancer history and make sure no evidence of underlying pulmonary embolisms contributing to the shortness of breath and to get a better characteristic of the pneumonia.  CT imaging consistent with pneumonia but otherwise stable known met without any worsening edema.  His x-ray was concerning for possible fracture the patient is  point tender over the radial head although is difficult given he has an abrasion there.  On review of patient's labs he looks dehydrated with elevated lactate and elevated creatinine.  Patient getting some IV fluids.   Sugars are slightly elevated but no evidence of DKA  8:24 PM discussed with Dr. Posey Pronto from orthopedic surgery who recommended a sling.  This also may just be an artifact given no effusion associated with it and is difficult to tell whether or not he is tender there due to an abrasion being right in this area but to be cautious we will place a sling and he can follow-up with orthopedics in 1 week  Given patient failed outpatient treatment for his pneumonia we will discussed with the hospital team for admission.   The patient is on the cardiac monitor to evaluate for evidence of arrhythmia and/or significant heart rate changes.     FINAL CLINICAL IMPRESSION(S) / ED DIAGNOSES   Final diagnoses:  Community acquired pneumonia of right lower lobe of lung  Lung cancer metastatic to brain ([place])  Sepsis, due to unspecified organism, unspecified whether acute organ dysfunction present [facility])     Rx / DC Orders   ED Discharge Orders     None        Note:  This document was prepared using Dragon voice recognition software and may include unintentional dictation errors.   Vanessa Crellin, MD 05/21/21 2303

## 2021-05-21 NOTE — ED Notes (Signed)
Pt given sandwich tray 

## 2021-05-21 NOTE — ED Notes (Signed)
Lab called for cultures.

## 2021-05-21 NOTE — Consult Note (Addendum)
Pharmacy Antibiotic Note  John Frank is a 66 y.o. male admitted on 05/21/2021 with pneumonia.  Pharmacy has been consulted for vancomycin dosing.  Plan: Patient received vancomycin 1000 mg x 1 in the ED. Will order vancomycin 500 mg x 1 for a total of 1500 mg loading dose. Pt's Scr is elevated. Will monitor vancomycin in the AM and dose accordingly. If Scr remains elevated recommend dosing by levels. IF MRSA PCR is negative can d/c vancomycin per consult order.   Height: 5\' 4"  (162.6 cm) Weight: 61.2 kg (135 lb) IBW/kg (Calculated) : 59.2  Temp (24hrs), Avg:98.4 F (36.9 C), Min:98.4 F (36.9 C), Max:98.4 F (36.9 C)  Recent Labs  Lab 05/18/21 1845 05/21/21 1309 05/21/21 1509  WBC 7.4 11.6*  --   CREATININE 1.18 1.35*  --   LATICACIDVEN  --  2.9* 1.5    Estimated Creatinine Clearance: 45.1 mL/min (A) (by C-G formula based on SCr of 1.35 mg/dL (H)).    No Known Allergies  Antimicrobials this admission: 1/10 cefepime >>  1/10 vancomycin >> 1/11 1/10 azithromycin >>   Dose adjustments this admission: None  Microbiology results:  1/10 MRSA PCR: Not Detected, Vanc Consult D/C  Thank you for allowing pharmacy to be a part of this patients care.  Renda Rolls, PharmD, [facility] 05/22/2021 6:34 AM

## 2021-05-21 NOTE — H&P (Signed)
History and Physical    KIRT CHEW WUG:[id number] DOB: 10/19/1955 DOA: 05/21/2021  PCP: Idelle Crouch, MD    Patient coming from:  Home.   Chief Complaint:  SOB.   HPI:  John Frank is a 66 y.o. male seen in ed with complaints of worsening SOB x 2 days, generalized weakness, dizzy weak today and fell today hurt his left elbow and is to f/u with ortho Dr.Clennon Nasca as outpatient. Pt received levaquin two days ago which he has failed outpatient therapy. Pt had cough and falling , pt is poor historian.  He does not report any chest pain, headache Blurred vision or palpitation. He lives alone and sister is contact.  Pt has past medical history of lung cancer, tobacco abuse.  ED Course:   Vitals:   05/21/21 2315 05/21/21 2330 05/22/21 0000 05/22/21 0018  BP:  (!) 98/58 102/65   Pulse: 95 (!) 101 92   Resp:      Temp:      TempSrc:      SpO2: 93% 93% (!) 89% 91%  Weight:      Height:      In ed pt is febrile and tachycardic meets sepsis criteria, and oxygenating normal on RA. Labs shows potassium of 3.4 glucose 196 and creatinine of 1.35, lactic acid of 2.9 white count of 11.6 hemoglobin of 12.1 platelets of 398, respiratory panel negative for flu and COVID.  Blood cultures collected as patient meets sepsis criteria in the ED.  Urinalysis shows glucosuria otherwise negative, In the emergency room patient had CTA chest which was negative for pulmonary embolism, positive for CAD, multiple densities throughout the lung reflective of small airway disease/alveolitis, focal airspace opacity right lower lobe concerning for pneumonia, treated nodule in right lower lobe is decreased in size since prior on comparison. Patient also had a CT cervical spine as he had a fall today which was negative for any acute fractures or bony abnormalities did show diffuse degenerative disc disease.  Patient also had CT head with and without contrast showing no acute intracranial abnormality, stable  edema surrounding the high right frontal metastasis same as seen on prior study. In the emergency room patient received azithromycin and cefepime along with vancomycin as patient met sepsis criteria.  Patient received half a liter normal saline bolus along with DuoNeb therapies.  Review of Systems:  Review of Systems  Constitutional:  Positive for malaise/fatigue.  Respiratory:  Positive for cough and shortness of breath.   Cardiovascular:  Positive for chest pain.       Chest pain with coughing.   Neurological:  Positive for weakness.  All other systems reviewed and are negative.  Past Medical History:  Diagnosis Date   AICD (automatic cardioverter/defibrillator) present    Angina at rest [facility])    Aortic atherosclerosis ([place])    CAD (coronary artery disease)    s/p stents in 8882   Chronic systolic CHF (congestive heart failure) (HCC)    Cirrhosis of liver (HCC)    COPD (chronic obstructive pulmonary disease) (HCC)    Diabetes (HCC)    GERD (gastroesophageal reflux disease)    Heart attack ([place])    Hyperlipidemia    Hypertension    Ischemic cardiomyopathy    Paroxysmal ventricular tachycardia    Right lower lobe lung mass    Shortness of breath dyspnea    Sleep apnea     Past Surgical History:  Procedure Laterality Date   CARDIAC PACEMAKER PLACEMENT  cardiac stents     CARDIAC SURGERY     defibrillator and pacemaker   CORONARY ANGIOPLASTY     ESOPHAGOGASTRODUODENOSCOPY N/A 09/19/2014   Procedure: ESOPHAGOGASTRODUODENOSCOPY (EGD);  Surgeon: Lucilla Lame, MD;  Location: [facility] ENDOSCOPY;  Service: Endoscopy;  Laterality: N/A;   ESOPHAGOGASTRODUODENOSCOPY (EGD) WITH PROPOFOL N/A 09/28/2017   Procedure: ESOPHAGOGASTRODUODENOSCOPY (EGD) WITH PROPOFOL;  Surgeon: Virgel Manifold, MD;  Location: [facility] ENDOSCOPY;  Service: Endoscopy;  Laterality: N/A;   ESOPHAGOGASTRODUODENOSCOPY (EGD) WITH PROPOFOL N/A 04/19/2018   Procedure: ESOPHAGOGASTRODUODENOSCOPY (EGD) WITH PROPOFOL with  Gastric Mapping;  Surgeon: [name], MD;  Location: [facility] ENDOSCOPY;  Service: Gastroenterology;  Laterality: N/A;   ESOPHAGOGASTRODUODENOSCOPY (EGD) WITH PROPOFOL N/A 12/13/2018   Procedure: ESOPHAGOGASTRODUODENOSCOPY (EGD) WITH PROPOFOL;  Surgeon: [name], MD;  Location: [facility] ENDOSCOPY;  Service: Gastroenterology;  Laterality: N/A;   ESOPHAGOGASTRODUODENOSCOPY (EGD) WITH PROPOFOL N/A 03/31/2019   Procedure: ESOPHAGOGASTRODUODENOSCOPY (EGD) WITH PROPOFOL;  Surgeon: Lin Landsman, MD;  Location: [facility] ENDOSCOPY;  Service: Gastroenterology;  Laterality: N/A;   IMPLANTABLE CARDIOVERTER DEFIBRILLATOR (ICD) GENERATOR CHANGE Left 02/21/2020   Procedure: ICD GENERATOR CHANGE;  Surgeon: Isaias Cowman, MD;  Location: [facility] ORS;  Service: Cardiovascular;  Laterality: Left;   VIDEO BRONCHOSCOPY WITH ENDOBRONCHIAL NAVIGATION N/A 06/22/2020   Procedure: VIDEO BRONCHOSCOPY WITH ENDOBRONCHIAL NAVIGATION;  Surgeon: Ottie Glazier, MD;  Location: [facility] ORS;  Service: Thoracic;  Laterality: N/A;   VIDEO BRONCHOSCOPY WITH ENDOBRONCHIAL ULTRASOUND N/A 06/22/2020   Procedure: VIDEO BRONCHOSCOPY WITH ENDOBRONCHIAL ULTRASOUND;  Surgeon: Ottie Glazier, MD;  Location: [facility] ORS;  Service: Thoracic;  Laterality: N/A;     reports that he has been smoking cigarettes. He has a 10.00 pack-year smoking history. He has never used smokeless tobacco. He reports that he does not drink alcohol and does not use drugs.  No Known Allergies  Family History  Problem Relation Age of Onset   Diabetes Mother    Heart attack Father 52    Prior to Admission medications   Medication Sig Start Date End Date Taking? Authorizing Provider  albuterol (VENTOLIN HFA) 108 (90 Base) MCG/ACT inhaler Inhale 2 puffs into the lungs every 6 (six) hours as needed for shortness of breath or wheezing.    [provider]  amLODipine (NORVASC) 5 MG tablet Hold due to low blood pressure. 03/23/21   Enzo Bi, MD  aspirin 81 MG EC  tablet Take 81 mg by mouth daily.    [provider]  atorvastatin (LIPITOR) 40 MG tablet Take 1 tablet (40 mg total) by mouth daily. 03/24/21 05/16/21  Enzo Bi, MD  budesonide-formoterol [facility]) 160-4.5 MCG/ACT inhaler Inhale 2 puffs into the lungs 2 (two) times daily.    [provider]  buPROPion (WELLBUTRIN) 75 MG tablet Take 1 tablet (75 mg total) by mouth daily. 03/24/21 05/16/21  Enzo Bi, MD  dexamethasone (DECADRON) 2 MG tablet Take 2 tablets (4 mg total) by mouth 2 (two) times daily. 4 mg twice per day x 3 days, 4 mg daily x3 days, 2 mg daily x 3 days, 2 mg every other day until finished 04/11/21   Chrystal, Eulas Post, MD  Dulaglutide 3 MG/0.5ML SOPN Inject 3 mg into the skin once a week.    [provider]  furosemide (LASIX) 40 MG tablet Take 1 tablet (40 mg total) by mouth daily. Reduced from twice daily due to low blood pressure. 03/24/21 05/16/21  Enzo Bi, MD  ipratropium-albuterol (DUONEB) 0.5-2.5 (3) MG/3ML SOLN Take 3 mLs by nebulization 4 (four) times daily.    [provider]  isosorbide mononitrate (IMDUR) 30 MG 24 hr tablet Hold due to low blood pressure. 03/23/21   Enzo Bi, MD  lansoprazole (PREVACID) 30 MG capsule Take 1 capsule (30 mg total) by mouth daily at 12 noon. To take while taking decadron 04/11/21   Noreene Filbert, MD  levocetirizine (XYZAL) 5 MG tablet Take 5 mg by mouth at bedtime.    [provider]  levofloxacin (LEVAQUIN) 750 MG tablet Take 1 tablet (750 mg total) by mouth daily for 6 days. 05/19/21 05/25/21  Hinda Kehr, MD  metFORMIN (GLUCOPHAGE) 1000 MG tablet Take 1 tablet (1,000 mg total) by mouth 2 (two) times daily. 03/24/21 05/16/21  Enzo Bi, MD  metoprolol succinate (TOPROL-XL) 50 MG 24 hr tablet Take 1 tablet (50 mg total) by mouth daily. Reduce from 100 mg daily. 03/24/21 05/16/21  Enzo Bi, MD  nitroGLYCERIN (NITROSTAT) 0.4 MG SL tablet Place 0.4 mg under the tongue every 5 (five) minutes as needed for chest  pain.    [provider]  ONE TOUCH ULTRA TEST test strip USE TO TEST BLOOD SUGAR ONCE DAILY 10/07/16   Wynetta Emery, Megan P, DO  pantoprazole (PROTONIX) 40 MG tablet Take 1 tablet (40 mg total) by mouth every evening. 03/24/21 05/16/21  Enzo Bi, MD  sacubitril-valsartan (ENTRESTO) 24-26 MG Hold due to low blood pressure. 03/23/21   Enzo Bi, MD  spironolactone (ALDACTONE) 25 MG tablet Take 0.5 tablets (12.5 mg total) by mouth daily. 03/24/21 05/16/21  Enzo Bi, MD  sucralfate (CARAFATE) 1 g tablet Take 1 g by mouth 4 (four) times daily -  with meals and at bedtime.    [provider]    Physical Exam: Vitals:   05/21/21 2315 05/21/21 2330 05/22/21 0000 05/22/21 0018  BP:  (!) 98/58 102/65   Pulse: 95 (!) 101 92   Resp:      Temp:      TempSrc:      SpO2: 93% 93% (!) 89% 91%  Weight:      Height:       Physical Exam Constitutional:      General: He is not in acute distress.    Appearance: He is ill-appearing.  HENT:     Head: Normocephalic and atraumatic.     Right Ear: External ear normal.     Left Ear: External ear normal.     Nose: Nose normal.     Mouth/Throat:     Mouth: Mucous membranes are moist.  Eyes:     Extraocular Movements: Extraocular movements intact.     Pupils: Pupils are equal, round, and reactive to light.  Cardiovascular:     Rate and Rhythm: Normal rate and regular rhythm.     Pulses: Normal pulses.     Heart sounds: Normal heart sounds.  Pulmonary:     Effort: Pulmonary effort is normal.     Breath sounds: Wheezing present.  Abdominal:     General: Bowel sounds are normal. There is no distension.     Palpations: Abdomen is soft. There is no mass.     Tenderness: There is no abdominal tenderness. There is no guarding.     Hernia: No hernia is present.  Musculoskeletal:     Right lower leg: No edema.     Left lower leg: No edema.  Skin:    General: Skin is warm.  Neurological:     General: No focal deficit present.     Mental  Status: He is alert and  oriented to person, place, and time.  Psychiatric:        Mood and Affect: Mood normal.        Behavior: Behavior normal.   Labs on Admission: I have personally reviewed following labs and imaging studies.  No results for input(s): CKTOTAL, CKMB, TROPONINI in the last 72 hours. Lab Results  Component Value Date   WBC 11.6 (H) 05/21/2021   HGB 12.1 (L) 05/21/2021   HCT 36.9 (L) 05/21/2021   MCV 96.3 05/21/2021   PLT 398 05/21/2021    Recent Labs  Lab 05/18/21 1845 05/21/21 1309 05/21/21 2203  NA 137 136  --   K 3.6 3.4*  --   CL 98 97*  --   CO2 28 26  --   BUN 17 22  --   CREATININE 1.18 1.35* 1.45*  CALCIUM 9.2 9.2  --   PROT 7.3  --   --   BILITOT 0.6  --   --   ALKPHOS 77  --   --   ALT 19  --   --   AST 31  --   --   GLUCOSE 217* 196*  --    Lab Results  Component Value Date   CHOL 106 01/01/2016   HDL 21 (L) 01/01/2016   LDLCALC 36 01/01/2016   TRIG 245 (H) 01/01/2016   Lab Results  Component Value Date   DDIMER 0.30 03/22/2021   COVID-19 Labs No results for input(s): DDIMER, FERRITIN, LDH, CRP in the last 72 hours. Lab Results  Component Value Date   [place] NEGATIVE 05/18/2021   [place] NEGATIVE 03/22/2021   [place] NEGATIVE 09/22/2020   [place] NEGATIVE 07/01/2020   Radiological Exams on Admission: DG Chest 2 View  Result Date: 05/21/2021 CLINICAL DATA:  Pneumonia EXAM: CHEST - 2 VIEW COMPARISON:  05/18/2021. CT abdomen 05/18/2021 which included the lung bases. FINDINGS: Heart size is normal. Pacemaker/AICD in place. There are patchy densities in the lungs including the lung bases consistent with patchy bronchopneumonia. No lobar consolidation or collapse. Healing posterior rib fractures seen on the right at ribs 4 and 5. No effusion. Ordinary degenerative changes affect the spine. IMPRESSION: Mild patchy density in both lungs consistent with mild bronchopneumonia. No lobar consolidation or collapse.  Electronically Signed   By: Nelson Chimes M.D.   On: 05/21/2021 13:28   DG Elbow Complete Left  Result Date: 05/21/2021 CLINICAL DATA:  Fall EXAM: LEFT ELBOW - COMPLETE 3+ VIEW COMPARISON:  None. FINDINGS: Possible minimal fracture deformity at the radial head though no sizable effusion. No dislocation evident. IMPRESSION: Questionable minimal fracture deformity at radial head, correlate for point tenderness. Electronically Signed   By: Donavan Foil M.D.   On: 05/21/2021 18:05   CT Head W or Wo Contrast  Result Date: 05/21/2021 CLINICAL DATA:  Neuro deficit, acute, stroke suspected EXAM: CT HEAD WITHOUT AND WITH CONTRAST TECHNIQUE: Contiguous axial images were obtained from the base of the skull through the vertex without and with intravenous contrast. RADIATION DOSE REDUCTION: This exam was performed according to the departmental dose-optimization program which includes automated exposure control, adjustment of the mA and/or kV according to patient size and/or use of iterative reconstruction technique. CONTRAST:  12m OMNIPAQUE IOHEXOL 350 MG/ML SOLN COMPARISON:  04/02/2021 FINDINGS: Brain: For low-density again noted in the high right frontal region compatible with edema surrounding known metastasis, similar to prior study. Punctate calcifications in the right parietal lobe, stable. No acute infarct or hemorrhage. No hydrocephalus. Vascular: No  hyperdense vessel or unexpected calcification. Skull: No acute calvarial abnormality. Sinuses/Orbits: No acute findings Other: None IMPRESSION: No acute intracranial abnormality. Stable edema surrounding the high right frontal metastasis as seen on prior study, unchanged. Electronically Signed   By: Rolm Baptise M.D.   On: 05/21/2021 19:51   CT Angio Chest PE W and/or Wo Contrast  Result Date: 05/21/2021 CLINICAL DATA:  Pulmonary embolism (PE) suspected, high prob. Shortness of breath, cough. EXAM: CT ANGIOGRAPHY CHEST WITH CONTRAST TECHNIQUE: Multidetector CT  imaging of the chest was performed using the standard protocol during bolus administration of intravenous contrast. Multiplanar CT image reconstructions and MIPs were obtained to evaluate the vascular anatomy. RADIATION DOSE REDUCTION: This exam was performed according to the departmental dose-optimization program which includes automated exposure control, adjustment of the mA and/or kV according to patient size and/or use of iterative reconstruction technique. CONTRAST:  56m OMNIPAQUE IOHEXOL 350 MG/ML SOLN COMPARISON:  11/29/2020 FINDINGS: Cardiovascular: No filling defects in the pulmonary arteries to suggest pulmonary emboli. Pacer wires in the right heart. Mild cardiomegaly. Coronary artery and aortic calcifications. Mediastinum/Nodes: No mediastinal, hilar, or axillary adenopathy. Trachea and esophagus are unremarkable. Thyroid unremarkable. Lungs/Pleura: Treated right lower lobe nodule further decrease in size, approximately 1.6 x 1.4 cm compared to 1.7 x 1.6 cm previously. Airspace opacity anteriorly in the right lower lobe is new since prior study. Peripheral tree-in-bud nodular densities throughout both lungs. No effusions. Upper Abdomen: Imaging into the upper abdomen demonstrates no acute findings. Musculoskeletal: Chest wall soft tissues are unremarkable. No acute bony abnormality. Review of the MIP images confirms the above findings. IMPRESSION: No evidence of pulmonary embolus. Coronary artery disease. Tree-in-bud nodular densities throughout the lungs. This likely reflects small airways disease/alveolitis. Focal airspace opacity anteriorly in the right lower lobe. Findings likely infectious/inflammatory. Treated nodule in the right lower lobe is further decreased in size since prior study. Aortic Atherosclerosis (ICD10-I70.0). Electronically Signed   By: KRolm BaptiseM.D.   On: 05/21/2021 19:45   CT Cervical Spine Wo Contrast  Result Date: 05/21/2021 CLINICAL DATA:  Neck trauma (Age >= 65y).   Fall. EXAM: CT CERVICAL SPINE WITHOUT CONTRAST TECHNIQUE: Multidetector CT imaging of the cervical spine was performed without intravenous contrast. Multiplanar CT image reconstructions were also generated. RADIATION DOSE REDUCTION: This exam was performed according to the departmental dose-optimization program which includes automated exposure control, adjustment of the mA and/or kV according to patient size and/or use of iterative reconstruction technique. COMPARISON:  None FINDINGS: Alignment: Normal Skull base and vertebrae: No acute fracture. No primary bone lesion or focal pathologic process. Soft tissues and spinal canal: No prevertebral fluid or swelling. No visible canal hematoma. Disc levels: Diffuse moderate to advanced degenerative disc disease and facet disease. Upper chest: Biapical scarring. Other: None IMPRESSION: No acute bony abnormality. Diffuse degenerative disc and facet disease. Electronically Signed   By: KRolm BaptiseM.D.   On: 05/21/2021 19:48    EKG: Independently reviewed.  ST 113, q waves in lead V1, qtc 463.    Assessment/Plan: Principal Problem:   SOB (shortness of breath) Active Problems:   PNA (pneumonia)   Sepsis (HCC)   COPD, severe (HCC)   Right lower lobe lung mass   Tobacco abuse   CAD (coronary artery disease)   Chronic systolic heart failure (HCC)   AICD (automatic cardioverter/defibrillator) present   Diabetes mellitus without complication (HCC)   AKI (acute kidney injury) ([place]   GERD (gastroesophageal reflux disease)   HTN (hypertension)   SOB/PNA/  Sepsis: Attribute to PNA. Cont supplemental oxygen.  Pt meets sepsis criteria and was started on cefepime, vancomycin and azithromycin.   We will follow cultures.    COPD/Tobacco abuse/ Right lung mass: Attribute to Pneumonia. SpO2: 91 % Cont duonebs and PRN MDI. Oxygen is abouve 90%, Venous blood gas shows PO 2 of 49 and we will start pt on 2 L oxygen. Nicotine patch.   CAD: Cont asa and  statin along with plavix.  Troponin is normal . No anginal or anginal equivalent symptoms.   C/H Chf/ AICD: Blood pressure 102/65, pulse 92, temperature 98.4 F (36.9 C), temperature source Oral, resp. rate 20, height '5\' 4"'  (1.626 m), weight 61.2 kg, SpO2 91 %. Stable. Euvolemic clinically. Cont  toprol xl / aldactone. Hold imdur and lasix, amlodipine. Hold entresto due to AKI.  DM II: SSI based on accuchecks and  Glycemic protocol.  Hold metformin due to AKI, Normal lactic acid.   AKI: Lab Results  Component Value Date   CREATININE 1.45 (H) 05/21/2021   CREATININE 1.35 (H) 05/21/2021   CREATININE 1.18 05/18/2021  Hold entresto, lasix.  GERD: Iv ppi.   HTN: Blood pressure 102/65, pulse 92, temperature 98.4 F (36.9 C), temperature source Oral, resp. rate 20, height '5\' 4"'  (1.626 m), weight 61.2 kg, SpO2 91 %. Continue metoprolol, aldactone at 12.5 from 25 mg .  Hold lasix/ imdur.  DVT prophylaxis:  Heparin    Code Status:  Full Code     Family Communication:  Corinda Gubler (Sister)  [phone] (Mobile)   Disposition Plan:  Home    Consults called:  None   Admission status: Inpatient.     Medical Decision Making Problems Addressed: Community acquired pneumonia of right lower lobe of lung: acute illness or injury Lung cancer metastatic to brain [facility]): chronic illness or injury Sepsis, due to unspecified organism, unspecified whether acute organ dysfunction present [facility]): acute illness or injury  Amount and/or Complexity of Data Reviewed Labs: ordered. Radiology: ordered.    Coding    Para Skeans MD [facility]  6 PM- 2 AM. Please contact me via secure Chat 6 PM-2 AM. To contact the [facility] Attending or Consulting provider [place] or covering provider during after hours [place], for this patient.   Check the care team in [facility] and look for a) attending/consulting [facility] provider listed and b) the [facility] team listed Log into [email or web address] and use [facility]'s universal password to access. If you do not have the password, please contact the hospital operator. Locate the [facility] provider you are looking for under [facility] and page to a number that you can be directly reached. If you still have difficulty reaching the provider, please page the [facility] (Director on Call) for the Hospitalists listed on amion for assistance. [email or web address] 05/22/2021, 12:43 AM   Time: 40 minutes.

## 2021-05-21 NOTE — Progress Notes (Signed)
Notified bedside nurse of need to draw lactic acid and blood cultures.,  then give antibiotics

## 2021-05-21 NOTE — Progress Notes (Signed)
Notified provider of need to order blood cultures.  Elink monitoring code sepsis

## 2021-05-21 NOTE — ED Notes (Signed)
Pt removed L arm sling. Pt states " I can't move like I want to with that thing on"

## 2021-05-21 NOTE — ED Notes (Signed)
Pt left arm sling placed

## 2021-05-22 ENCOUNTER — Telehealth: Payer: Self-pay | Admitting: Oncology

## 2021-05-22 DIAGNOSIS — R918 Other nonspecific abnormal finding of lung field: Secondary | ICD-10-CM

## 2021-05-22 DIAGNOSIS — Z95810 Presence of automatic (implantable) cardiac defibrillator: Secondary | ICD-10-CM

## 2021-05-22 DIAGNOSIS — J449 Chronic obstructive pulmonary disease, unspecified: Secondary | ICD-10-CM

## 2021-05-22 DIAGNOSIS — Z720 Tobacco use: Secondary | ICD-10-CM

## 2021-05-22 DIAGNOSIS — A419 Sepsis, unspecified organism: Secondary | ICD-10-CM | POA: Diagnosis present

## 2021-05-22 DIAGNOSIS — I2510 Atherosclerotic heart disease of native coronary artery without angina pectoris: Secondary | ICD-10-CM

## 2021-05-22 DIAGNOSIS — I5022 Chronic systolic (congestive) heart failure: Secondary | ICD-10-CM

## 2021-05-22 DIAGNOSIS — E119 Type 2 diabetes mellitus without complications: Secondary | ICD-10-CM

## 2021-05-22 DIAGNOSIS — I10 Essential (primary) hypertension: Secondary | ICD-10-CM

## 2021-05-22 LAB — BASIC METABOLIC PANEL
Anion gap: 9 (ref 5–15)
Anion gap: 10 (ref 5–15)
BUN: 20 mg/dL (ref 8–23)
BUN: 22 mg/dL (ref 8–23)
CO2: 27 mmol/L (ref 22–32)
CO2: 26 mmol/L (ref 22–32)
Calcium: 8.4 mg/dL — ABNORMAL LOW (ref 8.9–10.3)
Calcium: 8.7 mg/dL — ABNORMAL LOW (ref 8.9–10.3)
Chloride: 104 mmol/L (ref 98–111)
Chloride: 101 mmol/L (ref 98–111)
Creatinine, Ser: 1.23 mg/dL (ref 0.61–1.24)
Creatinine, Ser: 1.03 mg/dL (ref 0.61–1.24)
GFR, Estimated: >60 mL/min (ref >60)
GFR, Estimated: >60 mL/min (ref >60)
Glucose, Bld: 109 mg/dL — ABNORMAL HIGH (ref 70–99)
Glucose, Bld: 85 mg/dL (ref 70–99)
Potassium: 3.4 mmol/L — ABNORMAL LOW (ref 3.5–5.1)
Potassium: 3.4 mmol/L — ABNORMAL LOW (ref 3.5–5.1)
Sodium: 140 mmol/L (ref 135–145)
Sodium: 137 mmol/L (ref 135–145)

## 2021-05-22 LAB — GLUCOSE, CAPILLARY
Glucose-Capillary: 97 mg/dL (ref 70–99)
Glucose-Capillary: 171 mg/dL — ABNORMAL HIGH (ref 70–99)

## 2021-05-22 LAB — CBG MONITORING, ED
Glucose-Capillary: 128 mg/dL — ABNORMAL HIGH (ref 70–99)
Glucose-Capillary: 204 mg/dL — ABNORMAL HIGH (ref 70–99)
Glucose-Capillary: 96 mg/dL (ref 70–99)

## 2021-05-22 LAB — HEMOGLOBIN A1C
Hgb A1c MFr Bld: 7.4 % — ABNORMAL HIGH (ref 4.8–5.6)
Mean Plasma Glucose: 165.68 mg/dL

## 2021-05-22 LAB — PROTIME-INR
INR: 1.2 (ref 0.8–1.2)
Prothrombin Time: 15.3 seconds — ABNORMAL HIGH (ref 11.4–15.2)

## 2021-05-22 LAB — PROCALCITONIN: Procalcitonin: <0.1 ng/mL

## 2021-05-22 LAB — HIV ANTIBODY (ROUTINE TESTING W REFLEX): HIV Screen 4th Generation wRfx: NONREACTIVE

## 2021-05-22 LAB — MRSA NEXT GEN BY PCR, NASAL: MRSA by PCR Next Gen: NOT DETECTED

## 2021-05-22 MED ORDER — CLOPIDOGREL BISULFATE 75 MG PO TABS
75.0000 mg | ORAL_TABLET | Freq: Every day | ORAL | Status: DC
  Administered 2021-05-22 – 2021-05-23 (×2): 75 mg via ORAL
  Filled 2021-05-22 (×2): qty 1

## 2021-05-22 MED ORDER — POTASSIUM CHLORIDE CRYS ER 20 MEQ PO TBCR
40.0000 meq | EXTENDED_RELEASE_TABLET | Freq: Once | ORAL | Status: AC
  Administered 2021-05-22: 40 meq via ORAL
  Filled 2021-05-22: qty 2

## 2021-05-22 MED ORDER — AZITHROMYCIN 500 MG PO TABS
500.0000 mg | ORAL_TABLET | Freq: Every day | ORAL | Status: DC
  Administered 2021-05-22: 21:00:00 500 mg via ORAL
  Filled 2021-05-22: qty 1

## 2021-05-22 MED ORDER — ENOXAPARIN SODIUM 40 MG/0.4ML IJ SOSY
40.0000 mg | PREFILLED_SYRINGE | INTRAMUSCULAR | Status: DC
  Administered 2021-05-22: 40 mg via SUBCUTANEOUS
  Filled 2021-05-22: qty 0.4

## 2021-05-22 NOTE — ED Notes (Signed)
Pt given cup of gingerale

## 2021-05-22 NOTE — Progress Notes (Signed)
PROGRESS NOTE  John Frank LDJ:[id number] DOB: 1955/07/19 DOA: 05/21/2021 PCP: Idelle Crouch, MD  HPI/Recap of past 24 hours: John Frank is a 66 y.o. male with past medical history significant for lung CA, tobacco abuse, COPD, CHF, CAD, HTN, DM2, seen in ed with complaints of worsening SOB x 2 days, productive cough of greenish sputum, generalized weakness, and dizziness. Patient had a recent fall, hurting his left elbow, x-ray suspicious for fracture, and EDP spoke with orthopedic surgery Dr. Posey Pronto, who recommended a sling. Pt received levaquin two days ago for possible pneumonia, of which he failed outpatient therapy.  In the ED, patient was febrile, tachycardic, meeting sepsis criteria.  CTA chest was negative for PE but showed right lower lobe opacity concerning for pneumonia.  CT head negative for any acute intracranial abnormality but does show some right frontal metastasis which is not new.  CT cervical spine negative for any fracture.  Patient admitted for further management.    Today, saw patient in the ED, still noted to be coughing, noted some desaturations on room air to the 80s as patient was not wearing any oxygen, still reporting dizziness and generalized weakness.    Assessment/Plan: Principal Problem:   SOB (shortness of breath) Active Problems:   CAD (coronary artery disease)   GERD (gastroesophageal reflux disease)   Diabetes mellitus without complication (HCC)   COPD, severe (HCC)   HTN (hypertension)   Tobacco abuse   Chronic systolic heart failure (HCC)   Right lower lobe lung mass   AICD (automatic cardioverter/defibrillator) present   PNA (pneumonia)   AKI (acute kidney injury) ([place])   Sepsis ([place])   Sepsis likely 2/2 pneumonia Acute hypoxic respiratory failure On presentation desaturated to the 80s on room air On admission, noted to be febrile, tachycardic Currently afebrile, with leukocytosis Procalcitonin negative BC x2 pending CTA chest  was negative for PE but showed right lower lobe opacity concerning for pneumonia Continue cefepime, azithromycin Monitor closely Supplemental O2 as needed  History of right lung mass/CA COPD Tobacco abuse Management as above, continue antibiotics, duo nebs Supplemental O2 Nicotine patch  Chronic systolic and diastolic HF S/p AICD Echo done on 5/22 showed EF of less than 03%, grade 2 diastolic dysfunction Appears euvolemic Continue to hold Lasix, imdur, metoprolol, Entresto, Aldactone due to soft BP, may restart pending BP Monitor closely  CAD Currently chest pain-free Troponin negative, EKG with no acute ST changes Continue Plavix, aspirin, Lipitor, Imdur Telemetry  Hypertension BP soft Hold meds as mentioned above, restart pending BP  Diabetes mellitus type 2 Last A1c 7.4 SSI, Accu-Cheks, hypoglycemic protocol  GERD Continue PPI     Estimated body mass index is 23.17 kg/m as calculated from the following:   Height as of this encounter: 5\' 4"  (1.626 m).   Weight as of this encounter: 61.2 kg.     Code Status: Full  Family Communication: None at bedside  Disposition Plan: Status is: Inpatient  Remains inpatient appropriate because: Level of care     Consultants: None  Procedures: None  Antimicrobials: Cefepime Azithromycin  DVT prophylaxis: Lovenox   Objective: Vitals:   05/22/21 1215 05/22/21 1230 05/22/21 1245 05/22/21 1359  BP:  (!) 103/58  100/71  Pulse: (!) 105 (!) 105 93 91  Resp:  17  17  Temp:    98.8 F (37.1 C)  TempSrc:    Oral  SpO2: 94% 94% 95% 95%  Weight:      Height:  Intake/Output Summary (Last 24 hours) at 05/22/2021 1436 Last data filed at 05/22/2021 0423 Gross per 24 hour  Intake 300 ml  Output 300 ml  Net 0 ml   Filed Weights   05/21/21 1301  Weight: 61.2 kg    Exam: General: NAD, chronically ill-appearing, deconditioned Cardiovascular: S1, S2 present Respiratory: Coarse breath sounds  bilaterally Abdomen: Soft, nontender, nondistended, bowel sounds present Musculoskeletal: No bilateral pedal edema noted Skin: Normal Psychiatry: Normal mood     Data Reviewed: CBC: Recent Labs  Lab 05/18/21 1845 05/21/21 1309  WBC 7.4 11.6*  NEUTROABS 5.1  --   HGB 11.2* 12.1*  HCT 34.1* 36.9*  MCV 98.3 96.3  PLT 365 127   Basic Metabolic Panel: Recent Labs  Lab 05/18/21 1845 05/21/21 1309 05/21/21 2203 05/22/21 0702  NA 137 136  --  140  K 3.6 3.4*  --  3.4*  CL 98 97*  --  104  CO2 28 26  --  27  GLUCOSE 217* 196*  --  109*  BUN 17 22  --  20  CREATININE 1.18 1.35* 1.45* 1.23  CALCIUM 9.2 9.2  --  8.4*   GFR: Estimated Creatinine Clearance: 49.5 mL/min (by C-G formula based on SCr of 1.23 mg/dL). Liver Function Tests: Recent Labs  Lab 05/18/21 1845  AST 31  ALT 19  ALKPHOS 77  BILITOT 0.6  PROT 7.3  ALBUMIN 3.5   Recent Labs  Lab 05/18/21 1845  LIPASE 26   No results for input(s): AMMONIA in the last 168 hours. Coagulation Profile: Recent Labs  Lab 05/22/21 0702  INR 1.2   Cardiac Enzymes: No results for input(s): CKTOTAL, CKMB, CKMBINDEX, TROPONINI in the last 168 hours. BNP (last 3 results) No results for input(s): PROBNP in the last 8760 hours. HbA1C: Recent Labs    05/21/21 2203  HGBA1C 7.4*   CBG: Recent Labs  Lab 05/22/21 0032 05/22/21 0823 05/22/21 1150  GLUCAP 204* 128* 96   Lipid Profile: No results for input(s): CHOL, HDL, LDLCALC, TRIG, CHOLHDL, LDLDIRECT in the last 72 hours. Thyroid Function Tests: No results for input(s): TSH, T4TOTAL, FREET4, T3FREE, THYROIDAB in the last 72 hours. Anemia Panel: No results for input(s): VITAMINB12, FOLATE, FERRITIN, TIBC, IRON, RETICCTPCT in the last 72 hours. Urine analysis:    Component Value Date/Time   COLORURINE YELLOW (A) 05/18/2021 2223   APPEARANCEUR CLEAR (A) 05/18/2021 2223   LABSPEC >1.046 (H) 05/18/2021 2223   PHURINE 5.0 05/18/2021 2223   GLUCOSEU >=500 (A)  05/18/2021 2223   HGBUR NEGATIVE 05/18/2021 2223   BILIRUBINUR NEGATIVE 05/18/2021 2223   KETONESUR NEGATIVE 05/18/2021 2223   PROTEINUR NEGATIVE 05/18/2021 2223   NITRITE NEGATIVE 05/18/2021 2223   LEUKOCYTESUR NEGATIVE 05/18/2021 2223   Sepsis Labs: @LABRCNTIP (procalcitonin:4,lacticidven:4)  ) Recent Results (from the past 240 hour(s))  Resp Panel by RT-PCR (Flu A&B, Covid) Nasopharyngeal Swab     Status: None   Collection Time: 05/18/21  6:45 PM   Specimen: Nasopharyngeal Swab; Nasopharyngeal(NP) swabs in vial transport medium  Result Value Ref Range Status   SARS Coronavirus 2 by RT PCR NEGATIVE NEGATIVE Final    Comment: (NOTE) SARS-CoV-2 target nucleic acids are NOT DETECTED.  The SARS-CoV-2 RNA is generally detectable in upper respiratory specimens during the acute phase of infection. The lowest concentration of SARS-CoV-2 viral copies this assay can detect is 138 copies/mL. A negative result does not preclude SARS-Cov-2 infection and should not be used as the sole basis for treatment or other patient  management decisions. A negative result may occur with  improper specimen collection/handling, submission of specimen other than nasopharyngeal swab, presence of viral mutation(s) within the areas targeted by this assay, and inadequate number of viral copies(<138 copies/mL). A negative result must be combined with clinical observations, patient history, and epidemiological information. The expected result is Negative.  Fact Sheet for Patients:  EntrepreneurPulse.com.au  Fact Sheet for Healthcare Providers:  IncredibleEmployment.be  This test is no t yet approved or cleared by the [place] FDA and  has been authorized for detection and/or diagnosis of SARS-CoV-2 by FDA under an Emergency Use Authorization (EUA). This EUA will remain  in effect (meaning this test can be used) for the duration of the COVID-19 declaration under  Section 564(b)(1) of the Act, 21 U.S.C.section 360bbb-3(b)(1), unless the authorization is terminated  or revoked sooner.       Influenza A by PCR NEGATIVE NEGATIVE Final   Influenza B by PCR NEGATIVE NEGATIVE Final    Comment: (NOTE) The Xpert Xpress SARS-CoV-2/FLU/RSV plus assay is intended as an aid in the diagnosis of influenza from Nasopharyngeal swab specimens and should not be used as a sole basis for treatment. Nasal washings and aspirates are unacceptable for Xpert Xpress SARS-CoV-2/FLU/RSV testing.  Fact Sheet for Patients: EntrepreneurPulse.com.au  Fact Sheet for Healthcare Providers: IncredibleEmployment.be  This test is not yet approved or cleared by the [place] FDA and has been authorized for detection and/or diagnosis of SARS-CoV-2 by FDA under an Emergency Use Authorization (EUA). This EUA will remain in effect (meaning this test can be used) for the duration of the COVID-19 declaration under Section 564(b)(1) of the Act, 21 U.S.C. section 360bbb-3(b)(1), unless the authorization is terminated or revoked.  Performed at [facility], [address] [place], [place] [address]   Blood culture (routine x 2)     Status: None (Preliminary result)   Collection Time: 05/21/21  9:52 PM   Specimen: BLOOD  Result Value Ref Range Status   Specimen Description BLOOD LEFT ASSIST CONTROL  Final   Special Requests   Final    BOTTLES DRAWN AEROBIC AND ANAEROBIC Blood Culture adequate volume   Culture   Final    NO GROWTH < 12 HOURS Performed at [facility], [address], [place], [place] [address]    Report Status PENDING  Incomplete  Blood culture (routine x 2)     Status: None (Preliminary result)   Collection Time: 05/21/21 10:10 PM   Specimen: BLOOD  Result Value Ref Range Status   Specimen Description BLOOD LEFT HAND  Final   Special Requests   Final    BOTTLES DRAWN AEROBIC AND ANAEROBIC Blood  Culture adequate volume   Culture   Final    NO GROWTH < 12 HOURS Performed at [facility], [address], [place], [place] [address]    Report Status PENDING  Incomplete  MRSA Next Gen by PCR, Nasal     Status: None   Collection Time: 05/22/21  4:30 AM   Specimen: Nasal Mucosa; Nasal Swab  Result Value Ref Range Status   MRSA by PCR Next Gen NOT DETECTED NOT DETECTED Final    Comment: (NOTE) The GeneXpert MRSA Assay (FDA approved for NASAL specimens only), is one component of a comprehensive MRSA colonization surveillance program. It is not intended to diagnose MRSA infection nor to guide or monitor treatment for MRSA infections. Test performance is not FDA approved in patients less than 62 years old. Performed at  [facility], [address] [place], [place] [address]       Studies: DG Elbow Complete Left  Result Date: 05/21/2021 CLINICAL DATA:  Fall EXAM: LEFT ELBOW - COMPLETE 3+ VIEW COMPARISON:  None. FINDINGS: Possible minimal fracture deformity at the radial head though no sizable effusion. No dislocation evident. IMPRESSION: Questionable minimal fracture deformity at radial head, correlate for point tenderness. Electronically Signed   By: Donavan Foil M.D.   On: 05/21/2021 18:05   CT Head W or Wo Contrast  Result Date: 05/21/2021 CLINICAL DATA:  Neuro deficit, acute, stroke suspected EXAM: CT HEAD WITHOUT AND WITH CONTRAST TECHNIQUE: Contiguous axial images were obtained from the base of the skull through the vertex without and with intravenous contrast. RADIATION DOSE REDUCTION: This exam was performed according to the departmental dose-optimization program which includes automated exposure control, adjustment of the mA and/or kV according to patient size and/or use of iterative reconstruction technique. CONTRAST:  61mL OMNIPAQUE IOHEXOL 350 MG/ML SOLN COMPARISON:  04/02/2021 FINDINGS: Brain: For low-density again noted in the high right frontal region  compatible with edema surrounding known metastasis, similar to prior study. Punctate calcifications in the right parietal lobe, stable. No acute infarct or hemorrhage. No hydrocephalus. Vascular: No hyperdense vessel or unexpected calcification. Skull: No acute calvarial abnormality. Sinuses/Orbits: No acute findings Other: None IMPRESSION: No acute intracranial abnormality. Stable edema surrounding the high right frontal metastasis as seen on prior study, unchanged. Electronically Signed   By: Rolm Baptise M.D.   On: 05/21/2021 19:51   CT Angio Chest PE W and/or Wo Contrast  Result Date: 05/21/2021 CLINICAL DATA:  Pulmonary embolism (PE) suspected, high prob. Shortness of breath, cough. EXAM: CT ANGIOGRAPHY CHEST WITH CONTRAST TECHNIQUE: Multidetector CT imaging of the chest was performed using the standard protocol during bolus administration of intravenous contrast. Multiplanar CT image reconstructions and MIPs were obtained to evaluate the vascular anatomy. RADIATION DOSE REDUCTION: This exam was performed according to the departmental dose-optimization program which includes automated exposure control, adjustment of the mA and/or kV according to patient size and/or use of iterative reconstruction technique. CONTRAST:  26mL OMNIPAQUE IOHEXOL 350 MG/ML SOLN COMPARISON:  11/29/2020 FINDINGS: Cardiovascular: No filling defects in the pulmonary arteries to suggest pulmonary emboli. Pacer wires in the right heart. Mild cardiomegaly. Coronary artery and aortic calcifications. Mediastinum/Nodes: No mediastinal, hilar, or axillary adenopathy. Trachea and esophagus are unremarkable. Thyroid unremarkable. Lungs/Pleura: Treated right lower lobe nodule further decrease in size, approximately 1.6 x 1.4 cm compared to 1.7 x 1.6 cm previously. Airspace opacity anteriorly in the right lower lobe is new since prior study. Peripheral tree-in-bud nodular densities throughout both lungs. No effusions. Upper Abdomen: Imaging  into the upper abdomen demonstrates no acute findings. Musculoskeletal: Chest wall soft tissues are unremarkable. No acute bony abnormality. Review of the MIP images confirms the above findings. IMPRESSION: No evidence of pulmonary embolus. Coronary artery disease. Tree-in-bud nodular densities throughout the lungs. This likely reflects small airways disease/alveolitis. Focal airspace opacity anteriorly in the right lower lobe. Findings likely infectious/inflammatory. Treated nodule in the right lower lobe is further decreased in size since prior study. Aortic Atherosclerosis (ICD10-I70.0). Electronically Signed   By: Rolm Baptise M.D.   On: 05/21/2021 19:45   CT Cervical Spine Wo Contrast  Result Date: 05/21/2021 CLINICAL DATA:  Neck trauma (Age >= 65y).  Fall. EXAM: CT CERVICAL SPINE WITHOUT CONTRAST TECHNIQUE: Multidetector CT imaging of the cervical spine was performed without intravenous contrast. Multiplanar CT image reconstructions were also generated.  RADIATION DOSE REDUCTION: This exam was performed according to the departmental dose-optimization program which includes automated exposure control, adjustment of the mA and/or kV according to patient size and/or use of iterative reconstruction technique. COMPARISON:  None FINDINGS: Alignment: Normal Skull base and vertebrae: No acute fracture. No primary bone lesion or focal pathologic process. Soft tissues and spinal canal: No prevertebral fluid or swelling. No visible canal hematoma. Disc levels: Diffuse moderate to advanced degenerative disc disease and facet disease. Upper chest: Biapical scarring. Other: None IMPRESSION: No acute bony abnormality. Diffuse degenerative disc and facet disease. Electronically Signed   By: Rolm Baptise M.D.   On: 05/21/2021 19:48    Scheduled Meds:  aspirin EC  81 mg Oral Daily   atorvastatin  40 mg Oral Daily   azithromycin  500 mg Oral Daily   bacitracin   Topical BID   buPROPion  75 mg Oral Daily   clopidogrel   75 mg Oral Daily   enoxaparin (LOVENOX) injection  40 mg Subcutaneous Q24H   fluticasone furoate-vilanterol  1 puff Inhalation Daily   guaiFENesin  600 mg Oral BID   insulin aspart  0-15 Units Subcutaneous TID WC   ipratropium-albuterol  3 mL Nebulization QID   pantoprazole  40 mg Oral Daily    Continuous Infusions:  ceFEPime (MAXIPIME) IV Stopped (05/22/21 1151)     LOS: 1 day     Alma Friendly, MD [facility]  If 7PM-7AM, please contact night-coverage [email or web address] 05/22/2021, 2:36 PM

## 2021-05-22 NOTE — ED Notes (Signed)
Patient reports feeling dizzy

## 2021-05-22 NOTE — ED Notes (Signed)
Erlene Quan RN aware of assigned bed

## 2021-05-22 NOTE — Progress Notes (Signed)
PHARMACIST - PHYSICIAN COMMUNICATION  CONCERNING: Antibiotic IV to Oral Route Change Policy  RECOMMENDATION: This patient is receiving azithromycin by the intravenous route.  Based on criteria approved by the Pharmacy and Therapeutics Committee, the antibiotic(s) is/are being converted to the equivalent oral dose form(s).   DESCRIPTION: These criteria include: Patient being treated for a respiratory tract infection, urinary tract infection, cellulitis or clostridium difficile associated diarrhea if on metronidazole The patient is not neutropenic and does not exhibit a GI malabsorption state The patient is eating (either orally or via tube) and/or has been taking other orally administered medications for a least 24 hours The patient is improving clinically and has a Tmax < 100.5  If you have questions about this conversion, please contact the [place]  05/22/21

## 2021-05-22 NOTE — ED Notes (Signed)
Patient ate 100% breakfast tray. Patient given coffee and milk

## 2021-05-22 NOTE — ED Notes (Addendum)
Pt assisted to urinal at bedside. Pt had incontinent episode of urine in brief, new brief placed. Yellow socks placed on pt as well. Pt states he is not feeling better, c/o shortness of breath, pt states he is not ready to go home.  Requesting breathing treatment.   Bed alarm in place.

## 2021-05-22 NOTE — ED Notes (Signed)
Patient spilt coffee in floor. Housekeeping called

## 2021-05-22 NOTE — Telephone Encounter (Signed)
Family called to cancel pt's appt for 1-12. He is in the hospital.

## 2021-05-22 NOTE — ED Notes (Signed)
Patient spilt coffee on floor and bed. Patients bedding changed

## 2021-05-22 NOTE — ED Notes (Signed)
Pt resting quietly with eyes closed respirations equal and unlabored. Pt has oxygen [place] at 2L.

## 2021-05-23 ENCOUNTER — Inpatient Hospital Stay: Payer: Medicare Other | Admitting: Oncology

## 2021-05-23 DIAGNOSIS — R29898 Other symptoms and signs involving the musculoskeletal system: Secondary | ICD-10-CM | POA: Diagnosis present

## 2021-05-23 DIAGNOSIS — N179 Acute kidney failure, unspecified: Secondary | ICD-10-CM

## 2021-05-23 DIAGNOSIS — J189 Pneumonia, unspecified organism: Principal | ICD-10-CM

## 2021-05-23 DIAGNOSIS — E876 Hypokalemia: Secondary | ICD-10-CM

## 2021-05-23 DIAGNOSIS — M25522 Pain in left elbow: Secondary | ICD-10-CM | POA: Diagnosis present

## 2021-05-23 DIAGNOSIS — I5042 Chronic combined systolic (congestive) and diastolic (congestive) heart failure: Secondary | ICD-10-CM

## 2021-05-23 LAB — CBC WITH DIFFERENTIAL/PLATELET
Abs Immature Granulocytes: 0.4 10*3/uL — ABNORMAL HIGH (ref 0.00–0.07)
Basophils Absolute: 0.1 10*3/uL (ref 0.0–0.1)
Basophils Relative: 1 %
Eosinophils Absolute: 0.1 10*3/uL (ref 0.0–0.5)
Eosinophils Relative: 1 %
HCT: 33.5 % — ABNORMAL LOW (ref 39.0–52.0)
Hemoglobin: 10.9 g/dL — ABNORMAL LOW (ref 13.0–17.0)
Immature Granulocytes: 6 %
Lymphocytes Relative: 16 %
Lymphs Abs: 1.2 10*3/uL (ref 0.7–4.0)
MCH: 31.4 pg (ref 26.0–34.0)
MCHC: 32.5 g/dL (ref 30.0–36.0)
MCV: 96.5 fL (ref 80.0–100.0)
Monocytes Absolute: 0.6 10*3/uL (ref 0.1–1.0)
Monocytes Relative: 9 %
Neutro Abs: 4.9 10*3/uL (ref 1.7–7.7)
Neutrophils Relative %: 67 %
Platelets: 313 10*3/uL (ref 150–400)
RBC: 3.47 MIL/uL — ABNORMAL LOW (ref 4.22–5.81)
RDW: 18 % — ABNORMAL HIGH (ref 11.5–15.5)
Smear Review: NORMAL
WBC: 7.2 10*3/uL (ref 4.0–10.5)
nRBC: 0 % (ref 0.0–0.2)

## 2021-05-23 LAB — EXPECTORATED SPUTUM ASSESSMENT W GRAM STAIN, RFLX TO RESP C

## 2021-05-23 LAB — GLUCOSE, CAPILLARY
Glucose-Capillary: 121 mg/dL — ABNORMAL HIGH (ref 70–99)
Glucose-Capillary: 148 mg/dL — ABNORMAL HIGH (ref 70–99)

## 2021-05-23 MED ORDER — AZITHROMYCIN 250 MG PO TABS: 250.0000 mg | ORAL_TABLET | Freq: Every day | ORAL | 0 refills | Status: AC

## 2021-05-23 MED ORDER — AMOXICILLIN-POT CLAVULANATE 875-125 MG PO TABS: 1.0000 | ORAL_TABLET | Freq: Two times a day (BID) | ORAL | 0 refills | Status: AC

## 2021-05-23 MED ORDER — SODIUM CHLORIDE 0.9 % IV SOLN
2.0000 g | INTRAVENOUS | Status: DC
  Administered 2021-05-23: 2 g via INTRAVENOUS
  Filled 2021-05-23: qty 20

## 2021-05-23 NOTE — Assessment & Plan Note (Addendum)
Biopsy and BAL 2022 non-diagnostic.  Empiric Radiation therapy recommended given risk of recurrent biopsy with advanced lung COPD disease.    Now with metastasis to the right frontal lobe.  Likely cancer however.  Spoke to Dr. Tasia Catchings who will arrange outpatient follow up.

## 2021-05-23 NOTE — Hospital Course (Addendum)
John Frank is a 66 y.o. M with COPD, dCHF and metastatic lung cancer with mets to right frontal lobe/brain, CAD, NIDDM, and HTN who presented with SOB x 2 days, productive cough of greenish sputum, generalized weakness, and dizziness.   In the ED, patient was febrile, tachycardic.  CTA chest was negative for PE but showed right lower lobe opacity concerning for pneumonia.  CT head negative for any acute intracranial abnormality but does show some right frontal metastasis which is not new.  CT cervical spine negative for any fracture.  Patient admitted for further management.

## 2021-05-23 NOTE — Assessment & Plan Note (Signed)
Imaging showed fracture versus artifact to left elbow.  Patient had no further pain at that site, so I suspect not a fracture, sling d/c'd.  No ortho follow up needed.

## 2021-05-23 NOTE — Assessment & Plan Note (Signed)
FEV1 54%, still smoking.

## 2021-05-23 NOTE — Assessment & Plan Note (Signed)
Cr 1.45 on admission, improved to 1.03 today with treatment of pneumonia.

## 2021-05-23 NOTE — Assessment & Plan Note (Signed)
Due to right lower lobe pneumonia.

## 2021-05-23 NOTE — Assessment & Plan Note (Signed)
Treated and resolved °

## 2021-05-23 NOTE — Assessment & Plan Note (Signed)
Due to brain met.  CT head showed stable met, no new infarction.

## 2021-05-23 NOTE — Assessment & Plan Note (Signed)
AICD in place.  EF previously 40-45%, since May noted down to 20-25% with grade 2 DD Still appears euvolemic.

## 2021-05-23 NOTE — Progress Notes (Signed)
Discharge instructions provided to patient and patient's sister. Both verbalized understanding of all discharge instructions, including follow up.

## 2021-05-23 NOTE — Discharge Summary (Signed)
Physician Discharge Summary   Patient: John Frank MRN: [id number] DOB: June 04, 1955  Admit date:     05/21/2021  Discharge date: 05/23/21  Discharge Physician: Edwin Dada   PCP: Idelle Crouch, MD   Recommendations at discharge:   Follow up with PCP Dr. Doy Hutching in 1 week Follow up with Oncology, Dr. Tasia Catchings in 2-3 weeks     Discharge Diagnoses Primary discharge diagnosis   PNA (pneumonia) Active Problems:   AKI (acute kidney injury) ([place])   COPD, severe ([place])   Right lower lobe lung mass   Chronic combined systolic and diastolic CHF (congestive heart failure) ([place])   Hypokalemia   CAD (coronary artery disease)   Diabetes mellitus without complication ([place])   HTN (hypertension)   Tobacco abuse   Left elbow pain   Left arm weakness       Hospital Course   John Frank is a 66 y.o. M with COPD, dCHF and metastatic lung cancer with mets to right frontal lobe/brain, CAD, NIDDM, and HTN who presented with SOB x 2 days, productive cough of greenish sputum, generalized weakness, and dizziness.   In the ED, patient was febrile, tachycardic.  CTA chest was negative for PE but showed right lower lobe opacity concerning for pneumonia.  CT head negative for any acute intracranial abnormality but does show some right frontal metastasis which is not new.  CT cervical spine negative for any fracture.  Patient admitted for further management.        PNA (pneumonia)- (present on admission) New airspace opacity on CT chest in right lower lobe.    Patient treated here with Rocephin and improved quickly.  Patient now mentating at baseline, taking orals.  Temp < 100 F, heart rate < 100bpm, RR < 24, ambulated >100 feet with therapy today and SpO2 remained at baseline.   In light of his quick recovery, I think his hypoxia was likely transient, maybe from some underlying bronchospasm and his renal insufficiency and lactic acidosis were from dehydration not sepsis.  Sepsis and  respiratory failure ruled out.     AKI (acute kidney injury) ([place])- (present on admission) Cr 1.45 on admission, improved to 1.03 today with treatment of pneumonia.  Right lower lobe lung mass- (present on admission) Biopsy and BAL 2022 non-diagnostic.  Empiric Radiation therapy recommended given risk of recurrent biopsy with advanced lung COPD disease.    Now with metastasis to the right frontal lobe.  Likely cancer however.  Spoke to Dr. Tasia Catchings who will arrange outpatient follow up.  COPD, severe ([place])- (present on admission) FEV1 54%, still smoking.  Hypokalemia- (present on admission) Treated and resolved.  Chronic combined systolic and diastolic CHF (congestive heart failure) ([place])- (present on admission) AICD in place.  EF previously 40-45%, since May noted down to 20-25% with grade 2 DD Still appears euvolemic.  CAD (coronary artery disease)- (present on admission)    HTN (hypertension)- (present on admission)    Diabetes mellitus without complication ([place])    Left elbow pain- (present on admission) Imaging showed fracture versus artifact to left elbow.  Patient had no further pain at that site, so I suspect not a fracture, sling d/c'd.  No ortho follow up needed.  Tobacco abuse- (present on admission)    Left arm weakness- (present on admission) Due to brain met.  CT head showed stable met, no new infarction.        Disposition: Home Diet recommendation: Cardiac diet  DISCHARGE MEDICATION: Allergies as of  05/23/2021   No Known Allergies      Medication List     STOP taking these medications    amLODipine 5 MG tablet Commonly known as: NORVASC   dexamethasone 2 MG tablet Commonly known as: DECADRON   furosemide 40 MG tablet Commonly known as: LASIX   isosorbide mononitrate 30 MG 24 hr tablet Commonly known as: IMDUR   lansoprazole 30 MG capsule Commonly known as: PREVACID   levofloxacin 750 MG tablet Commonly known as: Levaquin    metoprolol succinate 50 MG 24 hr tablet Commonly known as: TOPROL-XL   sacubitril-valsartan 24-26 MG Commonly known as: ENTRESTO   spironolactone 25 MG tablet Commonly known as: ALDACTONE       TAKE these medications    albuterol 108 (90 Base) MCG/ACT inhaler Commonly known as: VENTOLIN HFA Inhale 2 puffs into the lungs every 6 (six) hours as needed for shortness of breath or wheezing.   amoxicillin-clavulanate 875-125 MG tablet Commonly known as: Augmentin Take 1 tablet by mouth 2 (two) times daily for 5 days.   aspirin 81 MG EC tablet Take 81 mg by mouth daily.   atorvastatin 40 MG tablet Commonly known as: LIPITOR Take 1 tablet (40 mg total) by mouth daily.   azithromycin 250 MG tablet Commonly known as: ZITHROMAX Take 1 tablet (250 mg total) by mouth daily.   budesonide-formoterol 160-4.5 MCG/ACT inhaler Commonly known as: SYMBICORT Inhale 2 puffs into the lungs 2 (two) times daily.   buPROPion 75 MG tablet Commonly known as: WELLBUTRIN Take 1 tablet (75 mg total) by mouth daily.   clopidogrel 75 MG tablet Commonly known as: PLAVIX Take 75 mg by mouth daily.   Dulaglutide 3 MG/0.5ML Sopn Inject 3 mg into the skin once a week.   ipratropium-albuterol 0.5-2.5 (3) MG/3ML Soln Commonly known as: DUONEB Take 3 mLs by nebulization 4 (four) times daily.   levocetirizine 5 MG tablet Commonly known as: XYZAL Take 5 mg by mouth at bedtime.   metFORMIN 1000 MG tablet Commonly known as: GLUCOPHAGE Take 1 tablet (1,000 mg total) by mouth 2 (two) times daily.   nitroGLYCERIN 0.4 MG SL tablet Commonly known as: NITROSTAT Place 0.4 mg under the tongue every 5 (five) minutes as needed for chest pain.   ONE TOUCH ULTRA TEST test strip Generic drug: glucose blood USE TO TEST BLOOD SUGAR ONCE DAILY   pantoprazole 40 MG tablet Commonly known as: PROTONIX Take 1 tablet (40 mg total) by mouth every evening.   sucralfate 1 g tablet Commonly known as:  CARAFATE Take 1 g by mouth 4 (four) times daily -  with meals and at bedtime.        Follow-up Information     Idelle Crouch, MD. Schedule an appointment as soon as possible for a visit in 1 week(s).   Specialty: Internal Medicine Contact information: [place] [address] [phone]         Earlie Server, MD. Schedule an appointment as soon as possible for a visit in 2 week(s).   Specialty: Oncology Contact information: [place] [address] [phone]                Discharge Instructions     Discharge instructions   Complete by: As directed    From Dr. Loleta Books: You were admitted for a pneumonia. Here, you were treated with antibiotics and this got better. You should complete the course of antibiotics with 5 more  days of Augmentin and 4 more days of azithromycin  Take: Azithromycin 250 mg once daily in the morning starting Friday morning for four more days Augmentin 875-125 mg twice daily (once in morning and once at night) starting Thursday night for 5 more days   For now: STOP taking your amlodipine, Imdur, furosemide, metoprolol, and Entresto These lower your blood pressure, which is quite low already  Go see Dr. Doy Hutching in 1 week  Dr. Tasia Catchings from the [facility] will contact you to set up a follow up in 2-3 weeks  Continue your diabetes medicines (metformin the pill, and dulaglutide the injection)   Increase activity slowly   Complete by: As directed        Discharge Exam: Filed Weights   05/21/21 1301  Weight: 61.2 kg   General: Pt is alert, awake, not in acute distress, sitting up in recliner, interactive Cardiovascular: RRR, nl S1-S2, no murmurs appreciated.   No LE edema.   Respiratory: Normal respiratory rate and rhythm.  Mild expiratory wheezing, no rales. Abdominal: Abdomen soft and non-tender.  No distension or HSM.   Neuro/Psych: Strength symmetric in upper and lower  extremities.  Judgment and insight appear moderately impaired by dementia.   Condition at discharge: stable  The results of significant diagnostics from this hospitalization (including imaging, microbiology, ancillary and laboratory) are listed below for reference.   Imaging Studies: DG Chest 2 View  Result Date: 05/21/2021 CLINICAL DATA:  Pneumonia EXAM: CHEST - 2 VIEW COMPARISON:  05/18/2021. CT abdomen 05/18/2021 which included the lung bases. FINDINGS: Heart size is normal. Pacemaker/AICD in place. There are patchy densities in the lungs including the lung bases consistent with patchy bronchopneumonia. No lobar consolidation or collapse. Healing posterior rib fractures seen on the right at ribs 4 and 5. No effusion. Ordinary degenerative changes affect the spine. IMPRESSION: Mild patchy density in both lungs consistent with mild bronchopneumonia. No lobar consolidation or collapse. Electronically Signed   By: Nelson Chimes M.D.   On: 05/21/2021 13:28   DG Chest 2 View  Result Date: 05/18/2021 CLINICAL DATA:  Cough EXAM: CHEST - 2 VIEW COMPARISON:  03/23/2021 FINDINGS: Left AICD remains in place, unchanged. Heart and mediastinal contours are within normal limits. No focal opacities or effusions. No acute bony abnormality. IMPRESSION: No active cardiopulmonary disease. Electronically Signed   By: Rolm Baptise M.D.   On: 05/18/2021 19:26   DG Elbow Complete Left  Result Date: 05/21/2021 CLINICAL DATA:  Fall EXAM: LEFT ELBOW - COMPLETE 3+ VIEW COMPARISON:  None. FINDINGS: Possible minimal fracture deformity at the radial head though no sizable effusion. No dislocation evident. IMPRESSION: Questionable minimal fracture deformity at radial head, correlate for point tenderness. Electronically Signed   By: Donavan Foil M.D.   On: 05/21/2021 18:05   CT Head W or Wo Contrast  Result Date: 05/21/2021 CLINICAL DATA:  Neuro deficit, acute, stroke suspected EXAM: CT HEAD WITHOUT AND WITH CONTRAST  TECHNIQUE: Contiguous axial images were obtained from the base of the skull through the vertex without and with intravenous contrast. RADIATION DOSE REDUCTION: This exam was performed according to the departmental dose-optimization program which includes automated exposure control, adjustment of the mA and/or kV according to patient size and/or use of iterative reconstruction technique. CONTRAST:  14m OMNIPAQUE IOHEXOL 350 MG/ML SOLN COMPARISON:  04/02/2021 FINDINGS: Brain: For low-density again noted in the high right frontal region compatible with edema surrounding known metastasis, similar to prior study. Punctate calcifications in the right parietal lobe, stable.  No acute infarct or hemorrhage. No hydrocephalus. Vascular: No hyperdense vessel or unexpected calcification. Skull: No acute calvarial abnormality. Sinuses/Orbits: No acute findings Other: None IMPRESSION: No acute intracranial abnormality. Stable edema surrounding the high right frontal metastasis as seen on prior study, unchanged. Electronically Signed   By: Rolm Baptise M.D.   On: 05/21/2021 19:51   CT Angio Chest PE W and/or Wo Contrast  Result Date: 05/21/2021 CLINICAL DATA:  Pulmonary embolism (PE) suspected, high prob. Shortness of breath, cough. EXAM: CT ANGIOGRAPHY CHEST WITH CONTRAST TECHNIQUE: Multidetector CT imaging of the chest was performed using the standard protocol during bolus administration of intravenous contrast. Multiplanar CT image reconstructions and MIPs were obtained to evaluate the vascular anatomy. RADIATION DOSE REDUCTION: This exam was performed according to the departmental dose-optimization program which includes automated exposure control, adjustment of the mA and/or kV according to patient size and/or use of iterative reconstruction technique. CONTRAST:  36m OMNIPAQUE IOHEXOL 350 MG/ML SOLN COMPARISON:  11/29/2020 FINDINGS: Cardiovascular: No filling defects in the pulmonary arteries to suggest pulmonary emboli.  Pacer wires in the right heart. Mild cardiomegaly. Coronary artery and aortic calcifications. Mediastinum/Nodes: No mediastinal, hilar, or axillary adenopathy. Trachea and esophagus are unremarkable. Thyroid unremarkable. Lungs/Pleura: Treated right lower lobe nodule further decrease in size, approximately 1.6 x 1.4 cm compared to 1.7 x 1.6 cm previously. Airspace opacity anteriorly in the right lower lobe is new since prior study. Peripheral tree-in-bud nodular densities throughout both lungs. No effusions. Upper Abdomen: Imaging into the upper abdomen demonstrates no acute findings. Musculoskeletal: Chest wall soft tissues are unremarkable. No acute bony abnormality. Review of the MIP images confirms the above findings. IMPRESSION: No evidence of pulmonary embolus. Coronary artery disease. Tree-in-bud nodular densities throughout the lungs. This likely reflects small airways disease/alveolitis. Focal airspace opacity anteriorly in the right lower lobe. Findings likely infectious/inflammatory. Treated nodule in the right lower lobe is further decreased in size since prior study. Aortic Atherosclerosis (ICD10-I70.0). Electronically Signed   By: KRolm BaptiseM.D.   On: 05/21/2021 19:45   CT Cervical Spine Wo Contrast  Result Date: 05/21/2021 CLINICAL DATA:  Neck trauma (Age >= 65y).  Fall. EXAM: CT CERVICAL SPINE WITHOUT CONTRAST TECHNIQUE: Multidetector CT imaging of the cervical spine was performed without intravenous contrast. Multiplanar CT image reconstructions were also generated. RADIATION DOSE REDUCTION: This exam was performed according to the departmental dose-optimization program which includes automated exposure control, adjustment of the mA and/or kV according to patient size and/or use of iterative reconstruction technique. COMPARISON:  None FINDINGS: Alignment: Normal Skull base and vertebrae: No acute fracture. No primary bone lesion or focal pathologic process. Soft tissues and spinal canal: No  prevertebral fluid or swelling. No visible canal hematoma. Disc levels: Diffuse moderate to advanced degenerative disc disease and facet disease. Upper chest: Biapical scarring. Other: None IMPRESSION: No acute bony abnormality. Diffuse degenerative disc and facet disease. Electronically Signed   By: KRolm BaptiseM.D.   On: 05/21/2021 19:48   CT ABDOMEN PELVIS W CONTRAST  Result Date: 05/18/2021 CLINICAL DATA:  Abdominal pain, acute, nonlocalized EXAM: CT ABDOMEN AND PELVIS WITH CONTRAST TECHNIQUE: Multidetector CT imaging of the abdomen and pelvis was performed using the standard protocol following bolus administration of intravenous contrast. CONTRAST:  1032mOMNIPAQUE IOHEXOL 300 MG/ML  SOLN COMPARISON:  08/01/2020 FINDINGS: Lower chest: Clustered nodular opacities in the lower lobes, likely infectious/inflammatory. No effusions. Pacer wires in the right heart. Hepatobiliary: No focal liver abnormality is seen. Status post cholecystectomy. No biliary dilatation.  Pancreas: No focal abnormality or ductal dilatation. Spleen: No focal abnormality.  Normal size. Adrenals/Urinary Tract: No adrenal abnormality. No focal renal abnormality. No stones or hydronephrosis. Urinary bladder is unremarkable. Stomach/Bowel: Normal appendix. Stomach, large and small bowel grossly unremarkable. Small bowel loops are noted in a moderate-sized right inguinal hernia. No bowel obstruction. Vascular/Lymphatic: Aortic atherosclerosis. No evidence of aneurysm or adenopathy. Reproductive: Mildly prominent prostate. Other: No free fluid or free air. Bilateral inguinal hernias containing small bowel loops on the right and fat on the left. No bowel obstruction. Musculoskeletal: No acute bony abnormality. IMPRESSION: Numerous clustered nodular opacities throughout the lower lobes suggesting infectious or inflammatory process. These are new since prior study. Aortic atherosclerosis. Bilateral inguinal hernias containing small bowel loops on  the right and fat on the left. No bowel obstruction. Electronically Signed   By: Rolm Baptise M.D.   On: 05/18/2021 20:12    Microbiology: Results for orders placed or performed during the hospital encounter of 05/21/21  Blood culture (routine x 2)     Status: None (Preliminary result)   Collection Time: 05/21/21  9:52 PM   Specimen: BLOOD  Result Value Ref Range Status   Specimen Description BLOOD LEFT ASSIST CONTROL  Final   Special Requests   Final    BOTTLES DRAWN AEROBIC AND ANAEROBIC Blood Culture adequate volume   Culture   Final    NO GROWTH 2 DAYS Performed at [facility], [address], [place], [place] [address]    Report Status PENDING  Incomplete  Blood culture (routine x 2)     Status: None (Preliminary result)   Collection Time: 05/21/21 10:10 PM   Specimen: BLOOD  Result Value Ref Range Status   Specimen Description BLOOD LEFT HAND  Final   Special Requests   Final    BOTTLES DRAWN AEROBIC AND ANAEROBIC Blood Culture adequate volume   Culture   Final    NO GROWTH 2 DAYS Performed at [facility], [address] [place], [place] [address]    Report Status PENDING  Incomplete  MRSA Next Gen by PCR, Nasal     Status: None   Collection Time: 05/22/21  4:30 AM   Specimen: Nasal Mucosa; Nasal Swab  Result Value Ref Range Status   MRSA by PCR Next Gen NOT DETECTED NOT DETECTED Final    Comment: (NOTE) The GeneXpert MRSA Assay (FDA approved for NASAL specimens only), is one component of a comprehensive MRSA colonization surveillance program. It is not intended to diagnose MRSA infection nor to guide or monitor treatment for MRSA infections. Test performance is not FDA approved in patients less than 19 years old. Performed at [facility], [address] [place], [place] [address]   Expectorated Sputum Assessment w Gram Stain, Rflx to Resp Cult     Status: None   Collection Time: 05/23/21 10:34 AM   Specimen: Expectorated Sputum   Result Value Ref Range Status   Specimen Description EXPECTORATED SPUTUM  Final   Special Requests NONE  Final   Sputum evaluation   Final    THIS SPECIMEN IS ACCEPTABLE FOR SPUTUM CULTURE Performed at [facility], [address] [place], [place] [address]    Report Status 05/23/2021 FINAL  Final    Labs: CBC: Recent Labs  Lab 05/18/21 1845 05/21/21 1309 05/23/21 0640  WBC 7.4 11.6* 7.2  NEUTROABS 5.1  --  4.9  HGB 11.2* 12.1* 10.9*  HCT 34.1* 36.9* 33.5*  MCV 98.3 96.3 96.5  PLT 365 398 350   Basic Metabolic Panel: Recent Labs  Lab 05/18/21 1845 05/21/21 1309 05/21/21 2203 05/22/21 0702 05/22/21 1536  NA 137 136  --  140 137  K 3.6 3.4*  --  3.4* 3.4*  CL 98 97*  --  104 101  CO2 28 26  --  27 26  GLUCOSE 217* 196*  --  109* 85  BUN 17 22  --  20 22  CREATININE 1.18 1.35* 1.45* 1.23 1.03  CALCIUM 9.2 9.2  --  8.4* 8.7*   Liver Function Tests: Recent Labs  Lab 05/18/21 1845  AST 31  ALT 19  ALKPHOS 77  BILITOT 0.6  PROT 7.3  ALBUMIN 3.5   CBG: Recent Labs  Lab 05/22/21 1150 05/22/21 1619 05/22/21 2055 05/23/21 0754 05/23/21 1124  GLUCAP 96 97 171* 121* 148*    Discharge time spent: 45 minutes.  Signed: Edwin Dada, MD [facility] 05/23/2021

## 2021-05-23 NOTE — Assessment & Plan Note (Deleted)
AICD in place.  EF previously 40-45%, since May noted down to 20-25% with grade 2 DD Still appears euvolemic.

## 2021-05-23 NOTE — TOC Initial Note (Addendum)
Transition of Care [facility]) - Initial/Assessment Note    Patient Details  Name: John Frank MRN: [id number] Date of Birth: 05/12/1956  Transition of Care [facility]) CM/SW Contact:    Candie Chroman, LCSW Phone Number: 05/23/2021, 12:41 PM  Clinical Narrative:   CSW met with patient. Sister and niece at bedside. They have concerns with patient returning home alone. Patient had fallen and did not call them. Discussed potential for ALF placement or personal care services. Patient gets disability and has Medicaid but it is only to cover Medicare premiums. Asked financial counselor to screen for full Medicaid. Made referral to [employer] for assistance. She is not sure if the Medicaid he does have will affect their ability to assist or not. Both sister and niece work so they cannot be with him 24/7.               Expected Discharge Plan:  (TBD) Barriers to Discharge: Other (must enter comment) (Safety at home)   Patient Goals and CMS Choice        Expected Discharge Plan and Services Expected Discharge Plan:  (TBD)     Post Acute Care Choice:  (TBD) Living arrangements for the past 2 months: Apartment Expected Discharge Date: 05/23/21                                    Prior Living Arrangements/Services Living arrangements for the past 2 months: Apartment Lives with:: Self Patient language and need for interpreter reviewed:: Yes Do you feel safe going back to the place where you live?: Yes      Need for Family Participation in Patient Care: Yes (Comment) Care giver support system in place?: Yes (comment) Current home services: DME Criminal Activity/Legal Involvement Pertinent to Current Situation/Hospitalization: No - Comment as needed  Activities of Daily Living Home Assistive Devices/Equipment: Other (Comment) ADL Screening (condition at time of admission) Patient's cognitive ability adequate to safely complete daily activities?: No Is the patient deaf or have  difficulty hearing?: No Does the patient have difficulty seeing, even when wearing glasses/contacts?: Yes Does the patient have difficulty concentrating, remembering, or making decisions?: No Patient able to express need for assistance with ADLs?: Yes Does the patient have difficulty dressing or bathing?: Yes Independently performs ADLs?: No Does the patient have difficulty walking or climbing stairs?: No Weakness of Legs: Both Weakness of Arms/Hands: None  Permission Sought/Granted Permission sought to share information with : Facility Sport and exercise psychologist, Family Supports Permission granted to share information with : Yes, Verbal Permission Granted  Share Information with NAME: Dia Crawford Furphy  Permission granted to share info w AGENCY: [employer]  Permission granted to share info w Relationship: Sister, niece  Permission granted to share info w Contact Information: Judeen Hammans: [phone]: [phone]  Emotional Assessment Appearance:: Appears stated age Attitude/Demeanor/Rapport: Engaged, Gracious Affect (typically observed): Accepting, Appropriate, Calm, Pleasant Orientation: : Oriented to Self, Oriented to Place, Oriented to  Time, Oriented to Situation Alcohol / Substance Use: Not Applicable Psych Involvement: No (comment)  Admission diagnosis:  SOB (shortness of breath) [R06.02] Community acquired pneumonia of right lower lobe of lung [J18.9] Lung cancer metastatic to brain ([place]) [C34.90, C79.31] Sepsis, due to unspecified organism, unspecified whether acute organ dysfunction present [facility]) [A41.9] Patient Active Problem List   Diagnosis Date Noted   Hypokalemia 05/23/2021   Sepsis ([place]) 05/22/2021   AICD (automatic cardioverter/defibrillator) present 05/21/2021  PNA (pneumonia) 05/21/2021   AKI (acute kidney injury) ([place]) 05/21/2021   Postural dizziness with presyncope 03/22/2021   Acute respiratory failure with hypoxia ([place]) 09/22/2020   Influenza  A 09/22/2020   Depression 09/22/2020   Right lower lobe lung mass 09/22/2020   Pain due to onychomycosis of toenails of both feet 03/15/2020   Ulcer of esophagus without bleeding    Hyperlipidemia associated with type 2 diabetes mellitus ([place]) 02/15/2019   Gastritis with intestinal metaplasia of stomach    COPD with acute exacerbation ([place]) 05/25/2018   Cirrhosis of liver with ascites ([place])    Chronic combined systolic and diastolic CHF (congestive heart failure) ([place]) 05/26/2017   Syncope 01/11/2016   Tobacco abuse 04/06/2015   HTN (hypertension) 11/03/2014   Sleep apnea 10/26/2014   CAD (coronary artery disease) 10/26/2014   GERD (gastroesophageal reflux disease) 10/26/2014   Polyp of colon 10/26/2014   Diabetes mellitus without complication ([place]) 87/56/4332   Hyperlipidemia 10/26/2014   COPD, severe ([place]) 10/26/2014   Status cardiac pacemaker 10/26/2014   Lesion of nasal septum 10/26/2014   PCP:  Idelle Crouch, MD Pharmacy:   [facility] [place] [facility] [address] [place] [address] Phone: [phone] Fax: [phone]     Social Determinants of Health (SDOH) Interventions    Readmission Risk Interventions Readmission Risk Prevention Plan 09/24/2020  Transportation Screening Complete  PCP or Specialist Appt within 3-5 Days Complete  HRI or [place] Complete  Social Work Consult for [place] Planning/Counseling Complete  Palliative Care Screening Not Applicable  Medication Review (RN Care Manager) Referral to Pharmacy  Some recent data might be hidden

## 2021-05-23 NOTE — Assessment & Plan Note (Addendum)
P/w SOB, cough, and hypoxia to the 80s requiring supplemental O2 and pO0 40s.  Multifactorial due to CAP in setting of advanced cardiopulmonary disease.

## 2021-05-23 NOTE — Evaluation (Signed)
Physical Therapy Evaluation Patient Details Name: John Frank MRN: [id number] DOB: 1956-04-24 Today's Date: 05/23/2021  History of Present Illness  ARCENIO MULLALY is a 66 y.o. male with past medical history significant for lung CA, tobacco abuse, COPD, CHF, CAD, HTN, DM2, seen in ed with complaints of worsening SOB x 2 days, productive cough of greenish sputum, generalized weakness, and dizziness. Patient had a recent fall, hurting his left elbow, x-ray suspicious for fracture, and EDP spoke with orthopedic surgery Dr. Posey Pronto, who recommended a sling. Pt received levaquin two days ago for possible pneumonia, of which he failed outpatient therapy   Clinical Impression  Patient received in bed, he is agreeable to PT session. Reports he needs band aid, arm is bleeding. Patient is mod independent with bed mobility and requires cues for safety with sit to stand with min guard. Patient ambulated 200 feet with RW and min guard. No lob. He walked short distance in room without AD and min guard no overt lob.  Patient demonstrates some poor judgement with regards to mobility as he has had multiple falls. Lives alone currently. Patient would benefit from more frequent supervision in home environment. He will continue to benefit from skilled PT while here to improve safety.          Recommendations for follow up therapy are one component of a multi-disciplinary discharge planning process, led by the attending physician.  Recommendations may be updated based on patient status, additional functional criteria and insurance authorization.  Follow Up Recommendations No PT follow up    Assistance Recommended at Discharge Frequent or constant Supervision/Assistance  Patient can return home with the following  A little help with walking and/or transfers;Assistance with cooking/housework;Direct supervision/assist for medications management;Direct supervision/assist for financial management    Equipment  Recommendations None recommended by PT  Recommendations for Other Services       Functional Status Assessment Patient has had a recent decline in their functional status and demonstrates the ability to make significant improvements in function in a reasonable and predictable amount of time.     Precautions / Restrictions Precautions Precautions: Fall Precaution Comments: reports multiple falls in the last few months Restrictions Weight Bearing Restrictions: No      Mobility  Bed Mobility Overal bed mobility: Modified Independent                  Transfers Overall transfer level: Modified independent Equipment used: Rolling walker (2 wheels)                    Ambulation/Gait Ambulation/Gait assistance: Min guard Gait Distance (Feet): 200 Feet Assistive device: Rolling walker (2 wheels) Gait Pattern/deviations: Step-through pattern Gait velocity: WNL     General Gait Details: patient is generally safe with use of RW. No lob. He ambulated in room short distance without AD and again no overt lob. Min guard  Stairs            Wheelchair Mobility    Modified Rankin (Stroke Patients Only)       Balance Overall balance assessment: Mild deficits observed, not formally tested;History of Falls                                           Pertinent Vitals/Pain Pain Assessment: No/denies pain    Home Living Family/patient expects to be discharged to:: Private residence Living Arrangements:  Alone Available Help at Discharge: Family;Available PRN/intermittently Type of Home: Apartment Home Access: Elevator       Home Layout: One level Home Equipment: Rollator (4 wheels);Cane - single point Additional Comments: Patient reports he uses walker, neice reports he does not use it all the time    Prior Function Prior Level of Function : Independent/Modified Independent;History of Falls (last six months)             Mobility  Comments: Family reports he does not take care of himself at home, does not use walker. Falls, doesn't bathe ADLs Comments: requires assistance     Hand Dominance   Dominant Hand: Right    Extremity/Trunk Assessment   Upper Extremity Assessment Upper Extremity Assessment: Defer to OT evaluation    Lower Extremity Assessment Lower Extremity Assessment: Generalized weakness    Cervical / Trunk Assessment Cervical / Trunk Assessment: Normal  Communication   Communication: No difficulties  Cognition Arousal/Alertness: Awake/alert Behavior During Therapy: WFL for tasks assessed/performed Overall Cognitive Status: History of cognitive impairments - at baseline                                 General Comments: patient has cognitive deficts at baseline. Difficulty with judgement, safety per family.        General Comments      Exercises     Assessment/Plan    PT Assessment Patient needs continued PT services  PT Problem List Decreased balance;Decreased cognition;Decreased knowledge of use of DME;Decreased safety awareness;Decreased knowledge of precautions;Decreased mobility       PT Treatment Interventions DME instruction;Therapeutic activities;Gait training;Therapeutic exercise;Functional mobility training;Patient/family education    PT Goals (Current goals can be found in the Care Plan section)  Acute Rehab PT Goals Patient Stated Goal: family feels that patient needs more supervision and assistance. PT Goal Formulation: With family Time For Goal Achievement: 05/30/21 Potential to Achieve Goals: Good    Frequency Min 2X/week     Co-evaluation               AM-PAC PT "6 Clicks" Mobility  Outcome Measure Help needed turning from your back to your side while in a flat bed without using bedrails?: None Help needed moving from lying on your back to sitting on the side of a flat bed without using bedrails?: None Help needed moving to and from a  bed to a chair (including a wheelchair)?: A Little Help needed standing up from a chair using your arms (e.g., wheelchair or bedside chair)?: A Little Help needed to walk in hospital room?: A Little Help needed climbing 3-5 steps with a railing? : A Little 6 Click Score: 20    End of Session Equipment Utilized During Treatment: Gait belt Activity Tolerance: Patient tolerated treatment well Patient left: in chair;with call bell/phone within reach;with chair alarm set;with family/visitor present Nurse Communication: Mobility status PT Visit Diagnosis: Unsteadiness on feet (R26.81);Repeated falls (R29.6)    Time: 5102-5852 PT Time Calculation (min) (ACUTE ONLY): 35 min   Charges:   PT Evaluation $PT Eval Moderate Complexity: 1 Mod PT Treatments $Gait Training: 8-22 mins        Aurelio Mccamy, PT, GCS 05/23/21,11:09 AM

## 2021-05-23 NOTE — TOC Transition Note (Signed)
Transition of Care [facility]) - CM/SW Discharge Note   Patient Details  Name: John Frank MRN: [id number] Date of Birth: 05-13-1955  [facility] Care [facility]) CM/SW Contact:  Candie Chroman, LCSW Phone Number: 05/23/2021, 2:59 PM   Clinical Narrative:   Patient has orders to discharge home today. Gave sister brochures for medical alert devices. No further concerns. CSW signing off.  Final next level of care: Home/Self Care Barriers to Discharge: Barriers Resolved   Patient Goals and CMS Choice        Discharge Placement                Patient to be transferred to facility by: Sister Name of family member notified: John Frank Patient and family notified of of transfer: 05/23/21  Discharge Plan and Services     Post Acute Care Choice:  (TBD)                               Social Determinants of Health ([place]) Interventions     Readmission Risk Interventions Readmission Risk Prevention Plan 09/24/2020  Transportation Screening Complete  PCP or Specialist Appt within 3-5 Days Complete  HRI or [place] Complete  Social Work Consult for [place] Planning/Counseling Complete  Palliative Care Screening Not Applicable  Medication Review Press photographer) Referral to Pharmacy  Some recent data might be hidden

## 2021-05-23 NOTE — Assessment & Plan Note (Addendum)
New airspace opacity on CT chest in right lower lobe.    Patient treated here with Rocephin and improved quickly.  Patient now mentating at baseline, taking orals.  Temp < 100 F, heart rate < 100bpm, RR < 24, ambulated >100 feet with therapy today and SpO2 remained at baseline.   In light of his quick recovery, I think his hypoxia was likely transient, maybe from some underlying bronchospasm and his renal insufficiency and lactic acidosis were from dehydration not sepsis.  Sepsis and respiratory failure ruled out.

## 2021-05-23 NOTE — Progress Notes (Signed)
Patient discharged out of facility via wheelchair with volunteer support in stable condition.

## 2021-05-23 NOTE — Evaluation (Signed)
Occupational Therapy Evaluation Patient Details Name: John Frank MRN: [id number] DOB: 08-Dec-1955 Today's Date: 05/23/2021   History of Present Illness John Frank is a 66 y.o. male with past medical history significant for lung CA, tobacco abuse, COPD, CHF, CAD, HTN, DM2, seen in ed with complaints of worsening SOB x 2 days, productive cough of greenish sputum, generalized weakness, and dizziness. Patient had a recent fall, hurting his left elbow, x-ray suspicious for fracture, and EDP spoke with orthopedic surgery John Frank, who recommended a sling. Pt received levaquin two days ago for possible pneumonia, of which he failed outpatient therapy   Clinical Impression   Pt seen for OT evaluation this date in setting of acute hospitalization d/t SOB. He is accompanied by his niece who shares concerns regarding pt's living situation and cognition. PT/OT co-eval completed d/t pt cognitive status/safety awareness. He is noted to be somewhat impulsive and have decreased insight into balance deficits. Pt able to CTS with CGA on initial trial and SUPV with RW on subsequent trials. Noted to demo some mild sway and benefits from UE support for ADL transfers and fxl mobility. He participates in bathing tasks with SETUP/SUPV For standing UB and requires CGA/MIN A For standing LB bathing d/t P balance for more dynamic tasks. Pt able to perform fxl mobility to chair at end of session and left with alarm set and in reach. Pt with poor insight requiring cues to initiate/terminate all hygiene tasks including bathing/dressing. His niece reports that he was very dirty as was his home when she and her mother (pt's sister) checked on him and found him down. Pt left with all needs met and in reach end of session. While his balance deficits aren't necessarily significant enough to warrant an inpatient rehabilitation stint, his poor safety awareness and insight coupled with decreased balance (esp dynamic balance) place pt at  HIGH risk for falls. He likely will require increased SUPV vs some sort of group-living setting such as a group home where SUPV can be increased, especially for IADLs such as med and financial management. Will continue to follow acutely.      Recommendations for follow up therapy are one component of a multi-disciplinary discharge planning process, led by the attending physician.  Recommendations may be updated based on patient status, additional functional criteria and insurance authorization.   Follow Up Recommendations  Home health OT    Assistance Recommended at Discharge Frequent or constant Supervision/Assistance  Patient can return home with the following A little help with bathing/dressing/bathroom;Assistance with cooking/housework;Direct supervision/assist for medications management;Direct supervision/assist for financial management;Assist for transportation    Functional Status Assessment  Patient has had a recent decline in their functional status and demonstrates the ability to make significant improvements in function in a reasonable and predictable amount of time.  Equipment Recommendations  Tub/shower seat    Recommendations for Other Services       Precautions / Restrictions Precautions Precautions: Fall Precaution Comments: reports multiple falls in the last few months Restrictions Weight Bearing Restrictions: No      Mobility Bed Mobility Overal bed mobility: Modified Independent                  Transfers Overall transfer level: Modified independent Equipment used: Rolling walker (2 wheels)                      Balance Overall balance assessment: Mild deficits observed, not formally tested;History of Falls  Sitting balance - Comments: G static/F dynamic   Standing balance support: Reliant on assistive device for balance Standing balance-Leahy Scale: Poor Standing balance comment: requires UE support, w/o he is noted to sway and demo  near LOB with standing ADLs                           ADL either performed or assessed with clinical judgement   ADL                                         General ADL Comments: able to perform UB ADLs I'ly, requires SUPV for seated LB ADLs d/t P dynamic sitting balance. Requires CGA/SBA for ADL transfers and fxl mobility with RW. Demos decreased safety awareness and often tries to abandon walker to the side during ADL tasks. Requires CGA/MIN A for dynamic standing tasks such as LB bathing/peri care d/t P dynamic standing balance.     Vision Baseline Vision/History: 1 Wears glasses Patient Visual Report: No change from baseline       Perception     Praxis      Pertinent Vitals/Pain Pain Assessment: No/denies pain     Hand Dominance Right   Extremity/Trunk Assessment Upper Extremity Assessment Upper Extremity Assessment: Generalized weakness   Lower Extremity Assessment Lower Extremity Assessment: Generalized weakness   Cervical / Trunk Assessment Cervical / Trunk Assessment: Normal   Communication Communication Communication: No difficulties   Cognition Arousal/Alertness: Awake/alert Behavior During Therapy: WFL for tasks assessed/performed Overall Cognitive Status: History of cognitive impairments - at baseline                                 General Comments: Pt family reports "special needs" at baseline. He is oriented and able to do some basic "higher level cognitive tasks" such as decipher how many quarters are in $1.50. That said, he has low insight into safety and deficits and famiyl notes that he is not meeting basic hygiene needs at home.     General Comments       Exercises Other Exercises Other Exercises: safety education with pt and family including increasing SUPV/support d/t decreased safety awareness/insight.   Shoulder Instructions      Home Living Family/patient expects to be discharged to:: Private  residence Living Arrangements: Alone Available Help at Discharge: Family;Available PRN/intermittently Type of Home: Apartment Home Access: Elevator     Home Layout: One level     Bathroom Shower/Tub: Teacher, early years/pre: Standard     Home Equipment: Rollator (4 wheels);Cane - single point   Additional Comments: Patient reports he uses walker, neice reports he does not use it all the time      Prior Functioning/Environment Prior Level of Function : Independent/Modified Independent;History of Falls (last six months)             Mobility Comments: Family reports he does not take care of himself at home, does not use walker. Falls, doesn't bathe ADLs Comments: able to perform self care I'ly technically because he lives alone, but family reports poor performance of said tasks. Noted to be dirty and have unsafe living conditions when pt's family presented to his home prior to admit to acute setting.        OT Problem List: Decreased  strength;Decreased activity tolerance;Decreased cognition;Decreased safety awareness;Decreased knowledge of use of DME or AE      OT Treatment/Interventions: Self-care/ADL training;Therapeutic exercise;DME and/or AE instruction;Therapeutic activities;Patient/family education;Balance training    OT Goals(Current goals can be found in the care plan section) Acute Rehab OT Goals Patient Stated Goal: to go home OT Goal Formulation: With patient Time For Goal Achievement: 06/06/21 ADL Goals Additional ADL Goal #1: Pt will perform dynamic balance IADL task with SBA/CGA with no falls and <10% verbal cues for safety. Additional ADL Goal #2: Pt will initiate hygiene/bathing/IADL-cleaning tasks with <10% cues to encourage engagement  OT Frequency: Min 2X/week    Co-evaluation              AM-PAC OT "6 Clicks" Daily Activity     Outcome Measure Help from another person eating meals?: None Help from another person taking care of  personal grooming?: None Help from another person toileting, which includes using toliet, bedpan, or urinal?: A Little Help from another person bathing (including washing, rinsing, drying)?: A Little Help from another person to put on and taking off regular upper body clothing?: None Help from another person to put on and taking off regular lower body clothing?: A Little 6 Click Score: 21   End of Session Equipment Utilized During Treatment: Gait belt;Rolling walker (2 wheels) Nurse Communication: Mobility status  Activity Tolerance: Patient tolerated treatment well Patient left: with call bell/phone within reach;in chair;with chair alarm set  OT Visit Diagnosis: Unsteadiness on feet (R26.81);Muscle weakness (generalized) (M62.81);History of falling (Z91.81);Repeated falls (R29.6)                Time: 5396-7289 OT Time Calculation (min): 31 min Charges:  OT General Charges $OT Visit: 1 Visit OT Evaluation $OT Eval Moderate Complexity: 1 Mod OT Treatments $Self Care/Home Management : 8-22 mins  Gerrianne Scale, MS, OTR/L ascom [phone] 05/23/21, 3:16 PM

## 2021-05-25 LAB — CULTURE, RESPIRATORY W GRAM STAIN: Culture: NORMAL

## 2021-05-26 LAB — CULTURE, BLOOD (ROUTINE X 2)
Culture: NO GROWTH
Culture: NO GROWTH
Special Requests: ADEQUATE
Special Requests: ADEQUATE

## 2021-05-27 ENCOUNTER — Ambulatory Visit: Payer: Medicare Other | Admitting: Podiatry

## 2021-05-28 ENCOUNTER — Telehealth: Payer: Self-pay | Admitting: Oncology

## 2021-05-28 NOTE — Telephone Encounter (Signed)
Daughter called to let us know that pt is in ED. Wants to set up an appt earlier . Call back at [phone]

## 2021-06-03 ENCOUNTER — Ambulatory Visit: Admission: RE | Admit: 2021-06-03 | Payer: Medicare Other | Source: Ambulatory Visit

## 2021-06-07 ENCOUNTER — Inpatient Hospital Stay: Payer: Medicare Other | Attending: Oncology | Admitting: Oncology

## 2021-06-10 ENCOUNTER — Ambulatory Visit: Payer: Medicare Other | Admitting: Radiation Oncology

## 2021-06-14 ENCOUNTER — Ambulatory Visit: Payer: Medicare Other | Admitting: Oncology

## 2021-06-18 DIAGNOSIS — C3490 Malignant neoplasm of unspecified part of unspecified bronchus or lung: Secondary | ICD-10-CM | POA: Insufficient documentation

## 2021-06-20 ENCOUNTER — Emergency Department: Payer: Medicare Other

## 2021-06-20 ENCOUNTER — Other Ambulatory Visit: Payer: Self-pay

## 2021-06-20 ENCOUNTER — Emergency Department
Admission: EM | Admit: 2021-06-20 | Discharge: 2021-06-21 | Disposition: A | Discharge status: Home / Self Care | Payer: Medicare Other | Attending: Emergency Medicine | Admitting: Emergency Medicine

## 2021-06-20 DIAGNOSIS — Z20822 Contact with and (suspected) exposure to covid-19: Secondary | ICD-10-CM | POA: Insufficient documentation

## 2021-06-20 DIAGNOSIS — Z85118 Personal history of other malignant neoplasm of bronchus and lung: Secondary | ICD-10-CM | POA: Insufficient documentation

## 2021-06-20 DIAGNOSIS — I509 Heart failure, unspecified: Secondary | ICD-10-CM | POA: Insufficient documentation

## 2021-06-20 DIAGNOSIS — R109 Unspecified abdominal pain: Secondary | ICD-10-CM | POA: Diagnosis not present

## 2021-06-20 DIAGNOSIS — J441 Chronic obstructive pulmonary disease with (acute) exacerbation: Secondary | ICD-10-CM | POA: Diagnosis not present

## 2021-06-20 DIAGNOSIS — R0602 Shortness of breath: Secondary | ICD-10-CM | POA: Diagnosis present

## 2021-06-20 LAB — CBC WITH DIFFERENTIAL/PLATELET
Abs Immature Granulocytes: 0.08 10*3/uL — ABNORMAL HIGH (ref 0.00–0.07)
Basophils Absolute: 0.1 10*3/uL (ref 0.0–0.1)
Basophils Relative: 1 %
Eosinophils Absolute: 0.1 10*3/uL (ref 0.0–0.5)
Eosinophils Relative: 1 %
HCT: 35.6 % — ABNORMAL LOW (ref 39.0–52.0)
Hemoglobin: 11.6 g/dL — ABNORMAL LOW (ref 13.0–17.0)
Immature Granulocytes: 1 %
Lymphocytes Relative: 9 %
Lymphs Abs: 1 10*3/uL (ref 0.7–4.0)
MCH: 31.4 pg (ref 26.0–34.0)
MCHC: 32.6 g/dL (ref 30.0–36.0)
MCV: 96.5 fL (ref 80.0–100.0)
Monocytes Absolute: 0.9 10*3/uL (ref 0.1–1.0)
Monocytes Relative: 8 %
Neutro Abs: 9.1 10*3/uL — ABNORMAL HIGH (ref 1.7–7.7)
Neutrophils Relative %: 80 %
Platelets: 377 10*3/uL (ref 150–400)
RBC: 3.69 MIL/uL — ABNORMAL LOW (ref 4.22–5.81)
RDW: 18.9 % — ABNORMAL HIGH (ref 11.5–15.5)
WBC: 11.4 10*3/uL — ABNORMAL HIGH (ref 4.0–10.5)
nRBC: 0 % (ref 0.0–0.2)

## 2021-06-20 LAB — LIPASE, BLOOD: Lipase: 24 U/L (ref 11–51)

## 2021-06-20 LAB — COMPREHENSIVE METABOLIC PANEL
ALT: 13 U/L (ref 0–44)
AST: 16 U/L (ref 15–41)
Albumin: 3.7 g/dL (ref 3.5–5.0)
Alkaline Phosphatase: 114 U/L (ref 38–126)
Anion gap: 10 (ref 5–15)
BUN: 17 mg/dL (ref 8–23)
CO2: 23 mmol/L (ref 22–32)
Calcium: 9 mg/dL (ref 8.9–10.3)
Chloride: 100 mmol/L (ref 98–111)
Creatinine, Ser: 0.84 mg/dL (ref 0.61–1.24)
GFR, Estimated: >60 mL/min (ref >60)
Glucose, Bld: 193 mg/dL — ABNORMAL HIGH (ref 70–99)
Potassium: 4.1 mmol/L (ref 3.5–5.1)
Sodium: 133 mmol/L — ABNORMAL LOW (ref 135–145)
Total Bilirubin: 0.9 mg/dL (ref 0.3–1.2)
Total Protein: 6.8 g/dL (ref 6.5–8.1)

## 2021-06-20 LAB — LACTIC ACID, PLASMA: Lactic Acid, Venous: 1.7 mmol/L (ref 0.5–1.9)

## 2021-06-20 LAB — BRAIN NATRIURETIC PEPTIDE: B Natriuretic Peptide: 301.5 pg/mL — ABNORMAL HIGH (ref 0.0–100.0)

## 2021-06-20 LAB — PROCALCITONIN: Procalcitonin: <0.1 ng/mL

## 2021-06-20 LAB — TROPONIN I (HIGH SENSITIVITY): Troponin I (High Sensitivity): 13 ng/L (ref <18)

## 2021-06-20 MED ORDER — IPRATROPIUM-ALBUTEROL 0.5-2.5 (3) MG/3ML IN SOLN
3.0000 mL | Freq: Once | RESPIRATORY_TRACT | Status: AC
  Administered 2021-06-20: 3 mL via RESPIRATORY_TRACT
  Filled 2021-06-20: qty 3

## 2021-06-20 MED ORDER — IOHEXOL 350 MG/ML SOLN
100.0000 mL | Freq: Once | INTRAVENOUS | Status: AC | PRN
  Administered 2021-06-20: 100 mL via INTRAVENOUS

## 2021-06-20 MED ORDER — METHYLPREDNISOLONE SODIUM SUCC 125 MG IJ SOLR: 125.0000 mg | Freq: Once | INTRAMUSCULAR | Status: DC

## 2021-06-20 MED ORDER — IPRATROPIUM-ALBUTEROL 0.5-2.5 (3) MG/3ML IN SOLN
3.0000 mL | Freq: Once | RESPIRATORY_TRACT | Status: AC
Start: 2021-06-20 — End: 2021-06-20
  Administered 2021-06-20: 3 mL via RESPIRATORY_TRACT
  Filled 2021-06-20: qty 3

## 2021-06-20 NOTE — ED Provider Notes (Addendum)
Today's Vitals   06/20/21 2110 06/20/21 2116 06/20/21 2340 06/21/21 0110  BP: 97/80  91/62 96/68  Pulse: (!) 105  98 93  Resp: 20  (!) 22 20  Temp: 98.8 F (37.1 C)  98.6 F (37 C)   TempSrc: Oral  Rectal   SpO2: 93%  97% 93%  Weight:  61.2 kg    Height:  5\' 4"  (1.626 m)    PainSc:  8      Body mass index is 23.17 kg/m.    11:30 PM Assumed care at shift change.  66 year old male with history of CHF, COPD, metastatic lung cancer followed by oncology at [facility] who presents to the emergency department complaints of left-sided chest pain and left upper abdominal pain.  CT scan showed no significant acute abnormality.  Repeat troponin, urine pending.  Patient now wheezing.  Receiving DuoNebs and will need to be reassessed with ambulatory sat.  Received Solu-Medrol with EMS.  May require admission.  11:53 PM  Pt's blood pressures have been running slightly low but on review of his previous records this appears chronic for him.  He is afebrile here with a normal rectal temperature.  His procalcitonin is negative.  I do not think that he is septic.  Do not think IV fluids are indicated given patient appears euvolemic and has history of CHF with EF of 15%.  1:34 AM  Pt's repeat troponin negative.  Urine shows no sign of infection.  COVID and flu negative.  Ambulated without difficulty.  Sats stayed between 92 to 93%.  2:07 AM  Pt reports feeling better.  Lungs are now clear.  He denies any pain.  Comfortable with plan for discharge home.  Will discharge with albuterol inhaler and prednisone burst.  Discussed return precautions.     At this time, I do not feel there is any life-threatening condition present. I reviewed all nursing notes, vitals, pertinent previous records.  All lab and urine results, EKGs, imaging ordered have been independently reviewed and interpreted by myself.  I reviewed all available radiology reports from any imaging ordered this visit.  Based on my assessment, I feel the  patient is safe to be discharged home without further emergent workup and can continue workup as an outpatient as needed. Discussed all findings, treatment plan as well as usual and customary return precautions with patient.  They verbalize understanding and are comfortable with this plan.  Outpatient follow-up has been provided as needed.  All questions have been answered.    Loxley Cibrian, Delice Bison, DO 06/21/21 0208    Stein Windhorst, Delice Bison, DO 06/21/21 [phone]

## 2021-06-20 NOTE — ED Provider Notes (Signed)
[facility] Provider Note    Event Date/Time   First MD Initiated Contact with Patient 06/20/21 2107     (approximate)   History   Abdominal pain.  HPI  John Frank is a 66 y.o. male  non-small cell lung cancer with mets to the brain having recently undergone radiation not been on chemotherapy, CHF with EF of 20% with ICD, COPD who comes in with concerns for abdominal pain.  Patient main complaint with me is left-sided abdominal pain that started today however he told EMS that he was also having chest pain and some worsening shortness of breath.  Patient was given 125 Solu-Medrol, 2 albuterol's and 324 of aspirin.  On review of records I had actually admitted patient from 1/10 to 1/12 for pneumonia.  He then was readmitted for concern for COPD exacerbation aspiration pneumonia.  During this admission he was requiring 2 to 3 L of nasal cannula but this resolved and did not require any oxygen on discharge.  Patient was discharged on 1/17.      Physical Exam   Triage Vital Signs: ED Triage Vitals  Blood pressure 97/80, pulse (!) 105, temperature 98.8 F (37.1 C), temperature source Oral, resp. rate 20, height 5\' 4"  (1.626 m), weight 61.2 kg, SpO2 93 %.  Most recent vital signs: Vitals:   06/20/21 2110  BP: 97/80  Pulse: (!) 105  Resp: 20  Temp: 98.8 F (37.1 C)  SpO2: 93%     General: Awake, no distress.  CV:  Good peripheral perfusion.  Resp:  Normal effort.  Abd:  No distention. Tender left side of abdomen  Other:  No swelling    ED Results / Procedures / Treatments   Labs (all labs ordered are listed, but only abnormal results are displayed) Labs Reviewed  CBC WITH DIFFERENTIAL/PLATELET  COMPREHENSIVE METABOLIC PANEL  LIPASE, BLOOD  URINALYSIS, ROUTINE W REFLEX MICROSCOPIC  BRAIN NATRIURETIC PEPTIDE  PROCALCITONIN  PROCALCITONIN  LACTIC ACID, PLASMA  LACTIC ACID, PLASMA  TROPONIN I (HIGH SENSITIVITY)     EKG  My  interpretation of EKG:  Sinus tachycardia rate of 105 without any ST elevation but T wave inversions in lead III, aVF, V5 and V6, left bundle branch block.  Reviewed prior EKGs and has occasionally had these previously.  RADIOLOGY I have reviewed the CT personally and no evidence of pulmonary embolism but still pending full report   PROCEDURES:  Critical Care performed: No  .1-3 Lead EKG Interpretation Performed by: Vanessa [place], MD Authorized by: Vanessa Oro[place], MD     Interpretation: abnormal     ECG rate:  105   ECG rate assessment: tachycardic     Rhythm: sinus tachycardia     Ectopy: none     Conduction: normal     MEDICATIONS ORDERED IN ED: Medications  iohexol (OMNIPAQUE) 350 MG/ML injection 100 mL (100 mLs Intravenous Contrast Given 06/20/21 2236)  ipratropium-albuterol (DUONEB) 0.5-2.5 (3) MG/3ML nebulizer solution 3 mL (3 mLs Nebulization Given 06/20/21 2330)  ipratropium-albuterol (DUONEB) 0.5-2.5 (3) MG/3ML nebulizer solution 3 mL (3 mLs Nebulization Given 06/20/21 2329)     IMPRESSION / MDM / ASSESSMENT AND PLAN / ED COURSE  I reviewed the triage vital signs and the nursing notes.                              Patient with significant history including reduced EF from CHF, currently being  treated for lung cancer who comes in with concerns for chest pain, shortness of breath and abdominal pain.  Given patient's comorbidities I think we should get a CT scan to make sure evidence of PE, return of pneumonia as well as CT abdomen to make sure no diverticulitis pancreatitis or other acute pathology.  CT imaging shows treated lung mass with improvement of his opacifications and now more likely inflammatory.  I have a procalcitonin still pending there was a T8 vertebral body fracture but patient is not tender there.  CT abdomen is negative other than a right inguinal hernia with some fat small intestine but no evidence of obstruction or incarceration.  I examined patient he is  easily reducible hernia on the right inguinal area without any skin changes or tenderness.  CMP shows slightly low sodium CBC shows slightly elevated white count, hemoglobin stable Initial troponin was negative BNP is slightly elevated but not as high as been previously Lactate is normal Procalcitonin was negative  Reevaluated patient he is nontender denies any pain.  I related to his lungs he is got significant wheezing.  We will trial some DuoNebs.  Patient needs repeat troponin, UA, covid test and ambulation trial after the DuoNeb's.  If patient still having significant wheezing or shortness of breath suspect patient will require admission versus going home if he is feeling better for COPD exac.   The patient is on the cardiac monitor to evaluate for evidence of arrhythmia and/or significant heart rate changes   FINAL CLINICAL IMPRESSION(S) / ED DIAGNOSES   Final diagnoses:  COPD exacerbation (Carl)     Rx / DC Orders   ED Discharge Orders     None        Note:  This document was prepared using Dragon voice recognition software and may include unintentional dictation errors.   Vanessa [place], MD 06/20/21 [phone]

## 2021-06-20 NOTE — ED Triage Notes (Signed)
Pt comes from home via [facility] with complaints of l sided chest pain, that started at 3pm today and SOB, that has since gotten better.Pt was admitted at [facility] a week ago for pnuemonia. Hx of COPD. Pt got 324mg   aspirin, 2 albuterol treatments, 125 solumedrol. Pt now complaining of pain in left side of abdomen.

## 2021-06-21 LAB — URINALYSIS, ROUTINE W REFLEX MICROSCOPIC
Bacteria, UA: NONE SEEN
Bilirubin Urine: NEGATIVE
Glucose, UA: >=500 mg/dL — AB
Hgb urine dipstick: NEGATIVE
Ketones, ur: NEGATIVE mg/dL
Leukocytes,Ua: NEGATIVE
Nitrite: NEGATIVE
Protein, ur: NEGATIVE mg/dL
Specific Gravity, Urine: 1.04 — ABNORMAL HIGH (ref 1.005–1.030)
Squamous Epithelial / HPF: NONE SEEN (ref 0–5)
pH: 6 (ref 5.0–8.0)

## 2021-06-21 LAB — RESP PANEL BY RT-PCR (FLU A&B, COVID) ARPGX2
Influenza A by PCR: NEGATIVE
Influenza B by PCR: NEGATIVE
SARS Coronavirus 2 by RT PCR: NEGATIVE

## 2021-06-21 LAB — TROPONIN I (HIGH SENSITIVITY): Troponin I (High Sensitivity): 12 ng/L (ref <18)

## 2021-06-21 MED ORDER — PREDNISONE 20 MG PO TABS: 60.0000 mg | ORAL_TABLET | Freq: Every day | ORAL | 0 refills | Status: DC

## 2021-06-21 MED ORDER — ALBUTEROL SULFATE HFA 108 (90 BASE) MCG/ACT IN AERS: 2.0000 | INHALATION_SPRAY | RESPIRATORY_TRACT | 0 refills | Status: AC | PRN

## 2021-06-21 NOTE — ED Notes (Addendum)
RN ambulated pt with pulse ox, Pt did not complain of SOB or any dizziness. Pt's oxygen saturation remained at 92-93% on room air.

## 2021-06-21 NOTE — ED Notes (Addendum)
Pt had accidentally removed his IV & spilled his urinal on his pants. He was provided a set of scrubs and warm blanket. Pt declined for this RN to call the patients family for a ride - he insisted that he call a friend in about an hour for a ride, rather than waking his sister.

## 2021-08-05 ENCOUNTER — Ambulatory Visit (INDEPENDENT_AMBULATORY_CARE_PROVIDER_SITE_OTHER): Payer: Medicare Other | Admitting: Podiatry

## 2021-08-05 ENCOUNTER — Other Ambulatory Visit: Payer: Self-pay

## 2021-08-05 ENCOUNTER — Encounter: Payer: Self-pay | Admitting: Podiatry

## 2021-08-05 DIAGNOSIS — M79674 Pain in right toe(s): Secondary | ICD-10-CM | POA: Diagnosis not present

## 2021-08-05 DIAGNOSIS — B351 Tinea unguium: Secondary | ICD-10-CM | POA: Diagnosis not present

## 2021-08-05 DIAGNOSIS — M79675 Pain in left toe(s): Secondary | ICD-10-CM | POA: Diagnosis not present

## 2021-08-05 DIAGNOSIS — E119 Type 2 diabetes mellitus without complications: Secondary | ICD-10-CM

## 2021-08-05 NOTE — Progress Notes (Signed)
This patient returns to my office for at risk foot care.  This patient requires this care by a professional since this patient will be at risk due to having diabetes and coagulation defect.  Patient is taking plavix.   This patient is unable to cut nails himself since the patient cannot reach his nails.These nails are painful walking and wearing shoes.  This patient presents for at risk foot care today. ? ?General Appearance  Alert, conversant and in no acute stress. ? ?Vascular  Dorsalis pedis and posterior tibial  pulses are weakly  palpable  bilaterally.  Capillary return is within normal limits  bilaterally. Cold feet  Bilaterally.  Absent digital hair. ? ?Neurologic  Senn-Weinstein monofilament wire test within normal limits  bilaterally. Muscle power within normal limits bilaterally. ? ?Nails Thick disfigured discolored nails with subungual debris  from hallux to fifth toes bilaterally. No evidence of bacterial infection or drainage bilaterally. Healing fourth toenails left foot. ? ?Orthopedic  No limitations of motion  feet .  No crepitus or effusions noted.  No bony pathology or digital deformities noted. ? ?Skin  normotropic skin with no porokeratosis noted bilaterally.  No signs of infections or ulcers noted.    ? ?Onychomycosis  Pain in right toes  Pain in left toes ? ?Consent was obtained for treatment procedures.   Mechanical debridement of nails 1-5  bilaterally performed with a nail nipper.  Filed with dremel without incident.  ? ? ?Return office visit   3 months                  Told patient to return for periodic foot care and evaluation due to potential at risk complications. ? ? ?Gardiner Barefoot DPM  ?

## 2021-08-14 ENCOUNTER — Ambulatory Visit: Payer: Medicare Other | Admitting: Radiation Oncology

## 2021-08-15 ENCOUNTER — Ambulatory Visit: Payer: Medicare Other | Admitting: Radiation Oncology

## 2021-09-19 ENCOUNTER — Other Ambulatory Visit: Payer: Self-pay | Admitting: *Deleted

## 2021-09-19 MED ORDER — LANSOPRAZOLE 30 MG PO CPDR: 30.0000 mg | DELAYED_RELEASE_CAPSULE | Freq: Every day | ORAL | 3 refills | Status: AC

## 2021-09-24 ENCOUNTER — Emergency Department: Payer: Medicare Other

## 2021-09-24 ENCOUNTER — Emergency Department
Admission: EM | Admit: 2021-09-24 | Discharge: 2021-09-24 | Disposition: A | Discharge status: Home / Self Care | Payer: Medicare Other | Attending: Emergency Medicine | Admitting: Emergency Medicine

## 2021-09-24 ENCOUNTER — Other Ambulatory Visit: Payer: Self-pay

## 2021-09-24 DIAGNOSIS — R0781 Pleurodynia: Secondary | ICD-10-CM | POA: Diagnosis present

## 2021-09-24 DIAGNOSIS — J449 Chronic obstructive pulmonary disease, unspecified: Secondary | ICD-10-CM | POA: Diagnosis not present

## 2021-09-24 DIAGNOSIS — R079 Chest pain, unspecified: Secondary | ICD-10-CM

## 2021-09-24 DIAGNOSIS — I2510 Atherosclerotic heart disease of native coronary artery without angina pectoris: Secondary | ICD-10-CM | POA: Insufficient documentation

## 2021-09-24 LAB — CBC WITH DIFFERENTIAL/PLATELET
Abs Immature Granulocytes: 0.05 10*3/uL (ref 0.00–0.07)
Basophils Absolute: 0.1 10*3/uL (ref 0.0–0.1)
Basophils Relative: 1 %
Eosinophils Absolute: 0.2 10*3/uL (ref 0.0–0.5)
Eosinophils Relative: 2 %
HCT: 45.5 % (ref 39.0–52.0)
Hemoglobin: 14.2 g/dL (ref 13.0–17.0)
Immature Granulocytes: 1 %
Lymphocytes Relative: 18 %
Lymphs Abs: 1.7 10*3/uL (ref 0.7–4.0)
MCH: 28.5 pg (ref 26.0–34.0)
MCHC: 31.2 g/dL (ref 30.0–36.0)
MCV: 91.2 fL (ref 80.0–100.0)
Monocytes Absolute: 0.7 10*3/uL (ref 0.1–1.0)
Monocytes Relative: 7 %
Neutro Abs: 6.7 10*3/uL (ref 1.7–7.7)
Neutrophils Relative %: 71 %
Platelets: 266 10*3/uL (ref 150–400)
RBC: 4.99 MIL/uL (ref 4.22–5.81)
RDW: 19.6 % — ABNORMAL HIGH (ref 11.5–15.5)
WBC: 9.4 10*3/uL (ref 4.0–10.5)
nRBC: 0 % (ref 0.0–0.2)

## 2021-09-24 LAB — BASIC METABOLIC PANEL
Anion gap: 11 (ref 5–15)
BUN: 20 mg/dL (ref 8–23)
CO2: 28 mmol/L (ref 22–32)
Calcium: 9.7 mg/dL (ref 8.9–10.3)
Chloride: 98 mmol/L (ref 98–111)
Creatinine, Ser: 1.24 mg/dL (ref 0.61–1.24)
GFR, Estimated: >60 mL/min (ref >60)
Glucose, Bld: 139 mg/dL — ABNORMAL HIGH (ref 70–99)
Potassium: 4.3 mmol/L (ref 3.5–5.1)
Sodium: 137 mmol/L (ref 135–145)

## 2021-09-24 LAB — TROPONIN I (HIGH SENSITIVITY): Troponin I (High Sensitivity): 7 ng/L (ref <18)

## 2021-09-24 MED ORDER — IOHEXOL 350 MG/ML SOLN
75.0000 mL | Freq: Once | INTRAVENOUS | Status: AC | PRN
  Administered 2021-09-24: 75 mL via INTRAVENOUS

## 2021-09-24 MED ORDER — PREDNISONE 50 MG PO TABS: 50.0000 mg | ORAL_TABLET | Freq: Every day | ORAL | 0 refills | Status: AC

## 2021-09-24 MED ORDER — CYCLOBENZAPRINE HCL 10 MG PO TABS: 10.0000 mg | ORAL_TABLET | Freq: Three times a day (TID) | ORAL | 0 refills | Status: AC | PRN

## 2021-09-24 NOTE — ED Notes (Signed)
See triage note  presents with pain to left lateral rib area   denies any injury  pain started couple a few days ago ?

## 2021-09-24 NOTE — ED Triage Notes (Signed)
Pt comes with c/o rib pain via EMS. Pt denies any falls. Pt states left side. Pt states pain when he takes a breath. ?

## 2021-09-24 NOTE — ED Provider Notes (Signed)
[facility] Provider Note    Event Date/Time   First MD Initiated Contact with Patient 09/24/21 1057     (approximate)   History   Chief Complaint Rib Injury   HPI John Frank is a 66 y.o. male, history of CAD, GERD, hyperlipidemia, COPD, gastritis, depression, presents to the emergency department for evaluation of rib pain patient states that he has been having ongoing pain in the left side of his ribs for the past week.  Denies any recent illnesses or injuries.  He states that it only hurts whenever he takes a very deep breath.  Denies fever/chills, chest pain, shortness of breath, abdominal pain, flank pain, nausea/vomiting, diarrhea, urinary symptoms, headache, dizziness/lightheadedness, or rash/lesions.  History Limitations: No limitations.        Physical Exam  Triage Vital Signs: ED Triage Vitals  Enc Vitals Group     BP 09/24/21 0935 100/65     Pulse Rate 09/24/21 0935 80     Resp 09/24/21 1040 17     Temp 09/24/21 0935 97.6 F (36.4 C)     Temp Source 09/24/21 0935 Oral     SpO2 09/24/21 0935 95 %     Weight --      Height --      Head Circumference --      Peak Flow --      Pain Score 09/24/21 0930 5     Pain Loc --      Pain Edu? --      Excl. in [place]? --     Most recent vital signs: Vitals:   09/24/21 1040 09/24/21 1308  BP:  110/68  Pulse:  78  Resp: 17 16  Temp:    SpO2:  96%    General: Awake, NAD.  Skin: Warm, dry. No rashes or lesions.  Eyes: PERRL. Conjunctivae normal.  CV: Good peripheral perfusion.  Resp: Normal effort.  Rhonchi present bilaterally, consistent with history of COPD. Abd: Soft, non-tender. No distention.  Neuro: At baseline. No gross neurological deficits.   Focused Exam: No gross deformities along the affected site.  No bony tenderness along the ribs.   Physical Exam    ED Results / Procedures / Treatments  Labs (all labs ordered are listed, but only abnormal results are  displayed) Labs Reviewed  CBC WITH DIFFERENTIAL/PLATELET - Abnormal; Notable for the following components:      Result Value   RDW 19.6 (*)    All other components within normal limits  BASIC METABOLIC PANEL - Abnormal; Notable for the following components:   Glucose, Bld 139 (*)    All other components within normal limits  TROPONIN I (HIGH SENSITIVITY)  TROPONIN I (HIGH SENSITIVITY)     EKG Sinus rhythm with PACs, rate of 76, QRS 120, QTc 470, notable T wave abnormalities including inferior and lateral T wave inversions, otherwise no significant ST segment changes.   RADIOLOGY  ED Provider Interpretation: I personally reviewed and interpreted these images.  No evidence of acute pathology based on my interpretation.  DG Ribs Unilateral W/Chest Left  Result Date: 09/24/2021 CLINICAL DATA:  Rib pain. EXAM: LEFT RIBS AND CHEST - 3 VIEW COMPARISON:  None Available. FINDINGS: No fracture or other bone lesions are seen involving the ribs. There is no evidence of pneumothorax or pleural effusion. Both lungs are clear. Heart size and mediastinal contours are within normal limits. Left access pacemaker with leads in the right atrium and right ventricle. IMPRESSION: Negative.  Electronically Signed   By: Keane Police D.O.   On: 09/24/2021 09:54   CT Angio Chest PE W and/or Wo Contrast  Result Date: 09/24/2021 CLINICAL DATA:  Pulmonary embolism (PE) suspected, unknown D-dimer * Tracking Code: BO * EXAM: CT ANGIOGRAPHY CHEST WITH CONTRAST TECHNIQUE: Multidetector CT imaging of the chest was performed using the standard protocol during bolus administration of intravenous contrast. Multiplanar CT image reconstructions and MIPs were obtained to evaluate the vascular anatomy. RADIATION DOSE REDUCTION: This exam was performed according to the departmental dose-optimization program which includes automated exposure control, adjustment of the mA and/or kV according to patient size and/or use of iterative  reconstruction technique. CONTRAST:  57mL OMNIPAQUE IOHEXOL 350 MG/ML SOLN COMPARISON:  06/20/2021 chest CT angiogram. FINDINGS: Cardiovascular: The study is high quality for the evaluation of pulmonary embolism. There are no filling defects in the central, lobar, segmental or subsegmental pulmonary artery branches to suggest acute pulmonary embolism. Atherosclerotic nonaneurysmal thoracic aorta. Normal caliber pulmonary arteries. Normal heart size. No significant pericardial fluid/thickening. Three-vessel coronary atherosclerosis. 2 lead left subclavian ICD with lead tips in the right atrium and right ventricular apex. Mediastinum/Nodes: No discrete thyroid nodules. Unremarkable esophagus. No pathologically enlarged axillary, mediastinal or hilar lymph nodes. Lungs/Pleura: No pneumothorax. No pleural effusion. Mild paraseptal and centrilobular emphysema with diffuse bronchial wall thickening. Solid 1.0 cm posterior right lower lobe pulmonary nodule (series 7/image 57), increased from 0.5 cm on 06/20/2021 chest CT. Central right lower lobe peribronchial 1.3 x 1.0 cm pulmonary nodule (series 7/image 55), stable. Stable mild platelike scarring versus atelectasis in the anterior right lower lobe. Scattered subcentimeter calcified pulmonary nodules in the lower lobes bilaterally are stable and compatible with prior granulomatous disease. Solid 0.3 cm anterior right upper lobe pulmonary nodule (series 7/image 39), stable. Upper abdomen: No acute abnormality. Musculoskeletal: Lytic T8 vertebral lesion with worsened moderate pathologic vertebral compression fracture. Smaller lytic lesion in the anterior inferior left T7 vertebral body and additional tiny lytic lesions posteriorly in the T5 and T6 vertebral bodies, new. Marked thoracic spondylosis. Review of the MIP images confirms the above findings. IMPRESSION: 1. No pulmonary embolism. 2. Moderate pathologic T8 vertebral compression fracture has worsened. New lytic  lesions in the T5, T6 and T7 vertebral bodies, compatible with metastatic disease. 3. Interval growth of solid 1.0 cm posterior right lower lobe pulmonary nodule. Central right lower lobe peribronchial 1.3 cm nodule is stable. Findings may represent metastatic disease or primary bronchogenic carcinoma. 4. Three-vessel coronary atherosclerosis. 5. Aortic Atherosclerosis (ICD10-I70.0) and Emphysema (ICD10-J43.9). Electronically Signed   By: Ilona Sorrel M.D.   On: 09/24/2021 14:51    PROCEDURES:  Critical Care performed: None.  Procedures    MEDICATIONS ORDERED IN ED: Medications  iohexol (OMNIPAQUE) 350 MG/ML injection 75 mL (75 mLs Intravenous Contrast Given 09/24/21 1342)     IMPRESSION / MDM / ASSESSMENT AND PLAN / ED COURSE  I reviewed the triage vital signs and the nursing notes.                              Differential diagnosis includes, but is not limited to, ACS, myocarditis/pericarditis, intercostal muscle strain, costochondritis, GERD, pulmonary embolism, pleurisy.  ED Course Appears well, vitals within normal limits.  NAD.  CBC shows no leukocytosis or anemia.  BMP shows no electrolyte abnormalities or evidence of kidney injury.  See details on EKG above.  Initial troponin 7, consistent with previous values.  Unlikely ACS or  marked rise/pericarditis.  Assessment/Plan Presentation consistent with pleurisy versus intercostal muscle strain.  Chest x-ray shows no abnormalities.  CT PE shows no evidence of pulmonary embolism, but does show other incidental findings including lytic lesions and pulmonary nodule.  He states that he is aware of these issues and will follow-up with his primary care provider/oncologist as needed.  We will plan to discharge him with a prescription for cyclobenzaprine and prednisone.  Considered admission for this patient, but given his stable presentation, unremarkable work-up, and reliable follow-up with his regular doctor, he is unlikely to  benefit from admission.  Provided the patient with anticipatory guidance, return precautions, and educational material. Encouraged the patient to return to the emergency department at any time if they begin to experience any new or worsening symptoms. Patient expressed understanding and agreed with the plan.       FINAL CLINICAL IMPRESSION(S) / ED DIAGNOSES   Final diagnoses:  Chest pain, unspecified type     Rx / DC Orders   ED Discharge Orders          Ordered    cyclobenzaprine (FLEXERIL) 10 MG tablet  3 times daily PRN        09/24/21 1512    predniSONE (DELTASONE) 50 MG tablet  Daily with breakfast        09/24/21 1512             Note:  This document was prepared using Dragon voice recognition software and may include unintentional dictation errors.   Teodoro Spray, [place] 09/24/21 Wynelle Fanny    Delman Kitten, MD 09/28/21 1534

## 2021-09-24 NOTE — Discharge Instructions (Addendum)
-  Take all of your medications as prescribed.  Use caution when taking the cyclobenzaprine, as it may make you drowsy. ? ?-You may additionally take Tylenol/ibuprofen as needed for pain. ? ?-Follow-up with your primary care provider and/or oncologist for follow-up of the incidental findings on your CT scan. ? ?-Return to the emergency department anytime if you begin to experience any new or worsening symptoms. ?

## 2021-09-24 NOTE — ED Notes (Signed)
EKG to EDP Quale in person.  ?

## 2021-09-24 NOTE — ED Provider Notes (Signed)
Patient's EKG is interpreted by me at 1232 ?Heart rate 75 ?QRS 120 ?QTc 470 ?Normal sinus rhythm, occasional PAC.  Notable T wave abnormality including inferior and lateral T wave inversions, compared with previous EKG from February 9 of this year morphology is unchanged.  Rate today has decreased to 75 ?  ?Delman Kitten, MD ?09/24/21 1236 ? ?

## 2021-10-13 IMAGING — CT CT CHEST SUPER D W/O CM
2 of 4 series · 15 of 36 positions shown, 18 images · non-contrast
Comparison: PET-CT dated 04/24/2020

CLINICAL DATA: Lung mass on PET, pre bronchoscopy

EXAM:
CT CHEST WITHOUT CONTRAST
TECHNIQUE: Multidetector CT imaging of the chest was performed using thin slice
collimation for electromagnetic bronchoscopy planning purposes,
without intravenous contrast.

[Series 4: super (person_name) (person_name) · axial · 0.74mm/px · z∈[-414,-115]mm · 12 of 410 slices shown, 15 images]
[im 18/410  mediastinal]
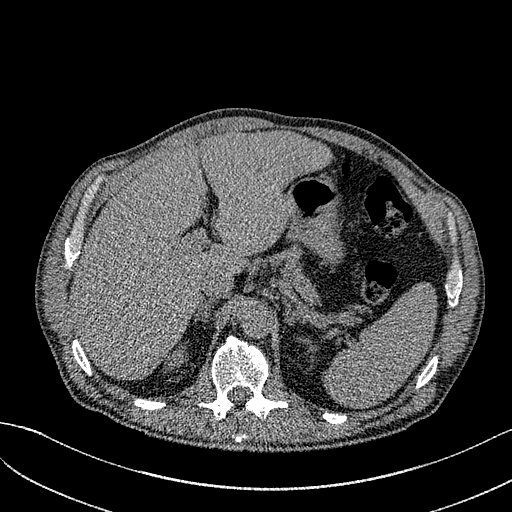
[im 18/410  lung]
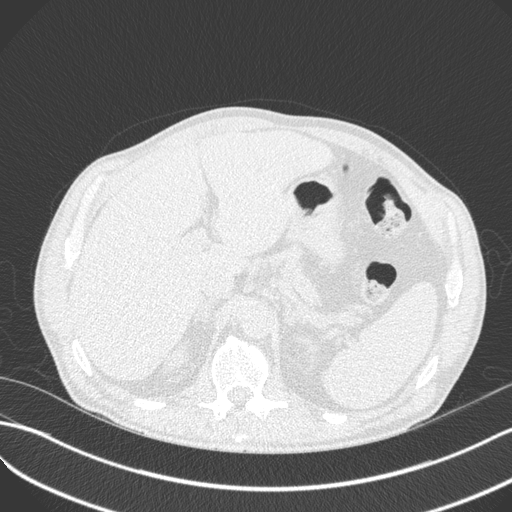
[im 54/410  lung]
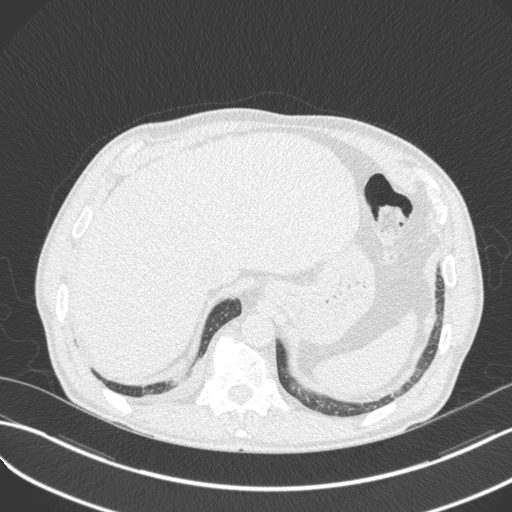
[im 89/410  lung]
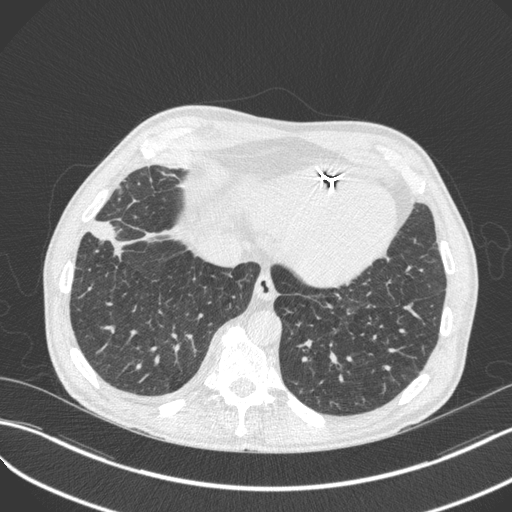
[im 125/410  lung]
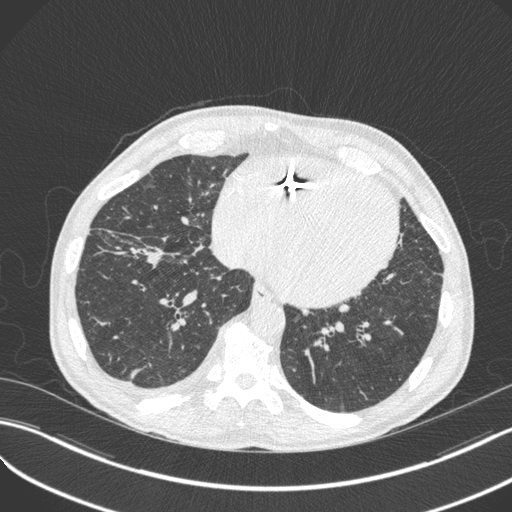
[im 161/410  mediastinal]
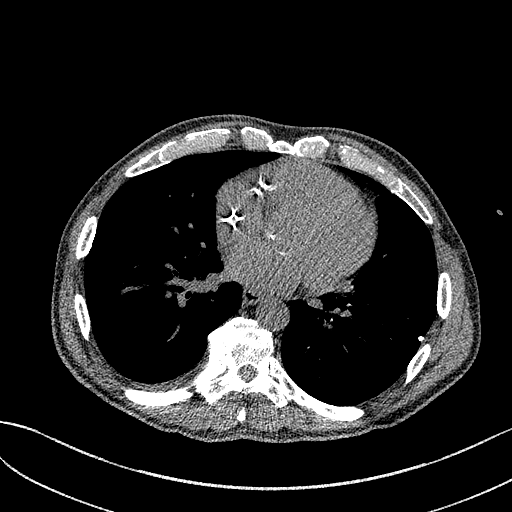
[im 161/410  lung]
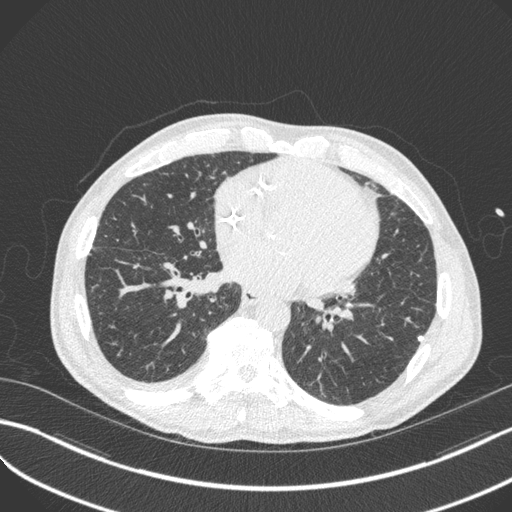
[im 196/410  lung]
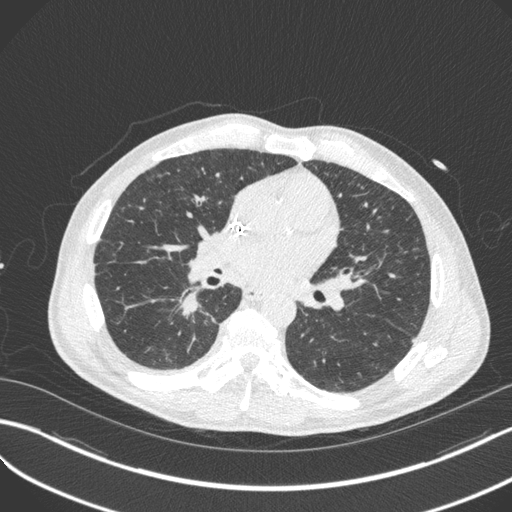
[im 214/410  lung]
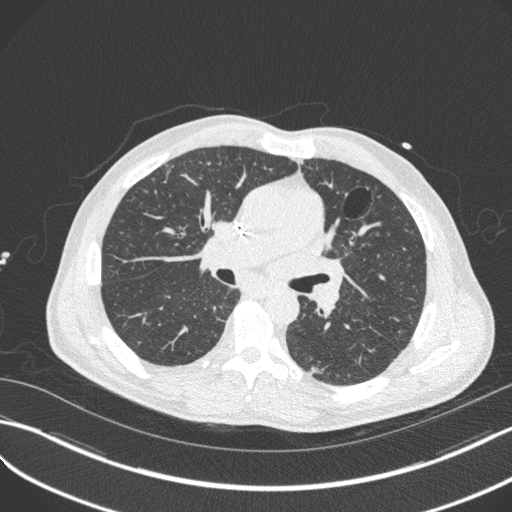
[im 249/410  lung]
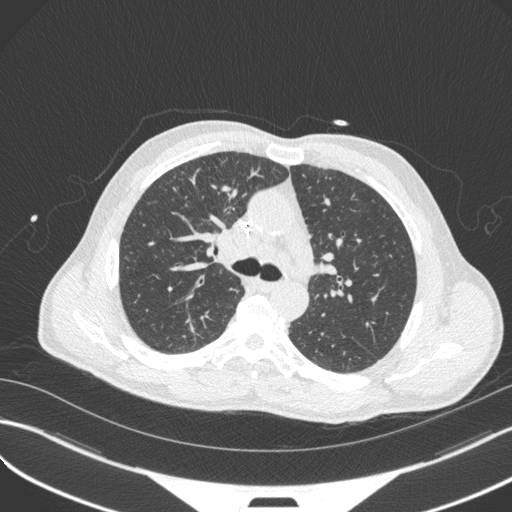
[im 285/410  mediastinal]
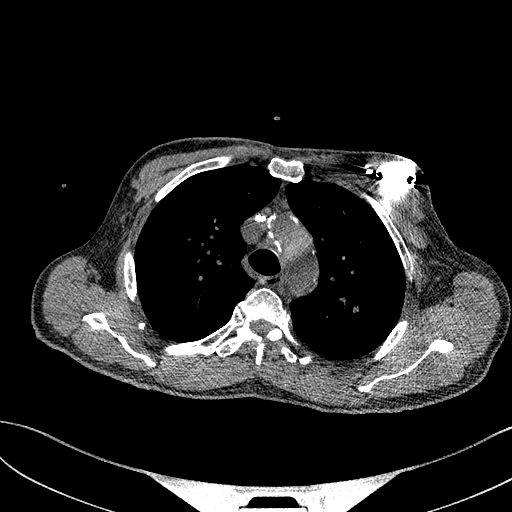
[im 285/410  lung]
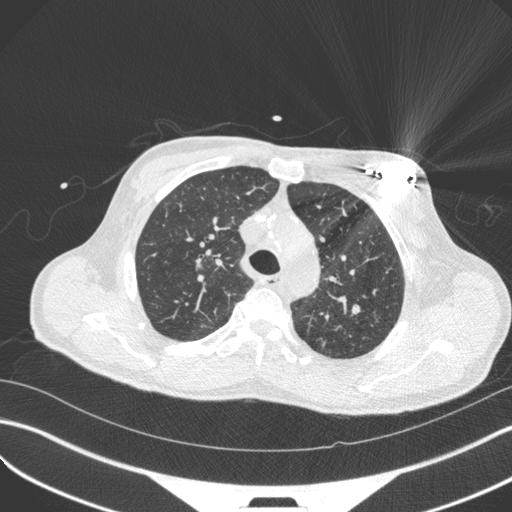
[im 321/410  lung]
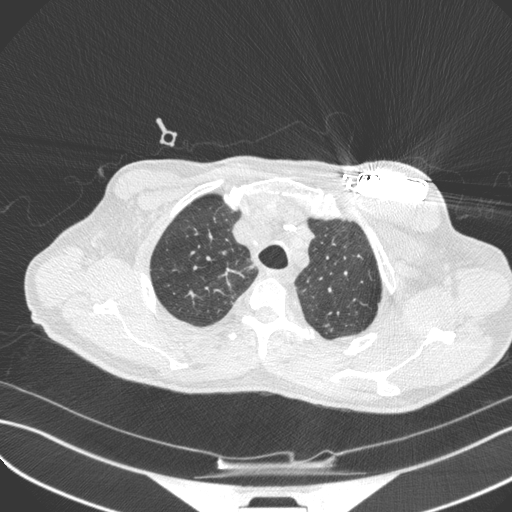
[im 356/410  lung]
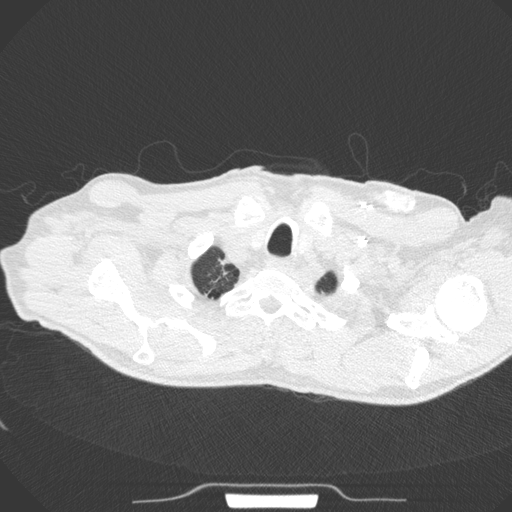
[im 392/410  lung]
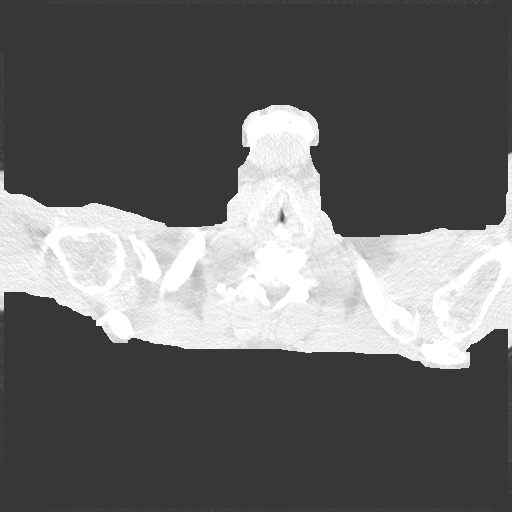

[Series 6: coronal · coronal · 0.70mm/px · 3 of 113 slices shown]
[im 23/113  lung]
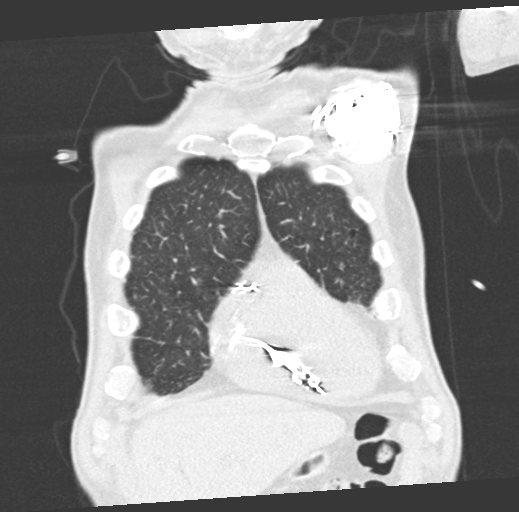
[im 45/113  lung]
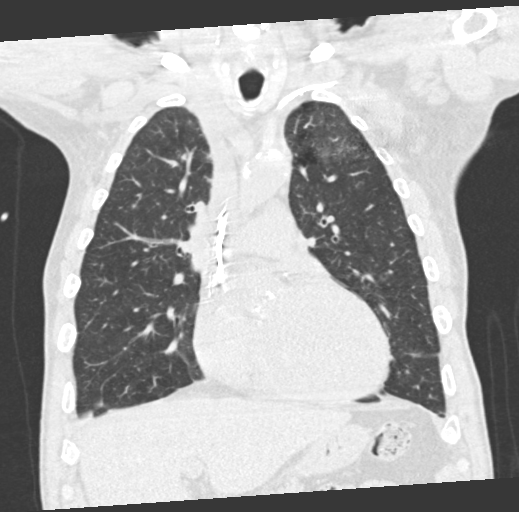
[im 68/113  lung]
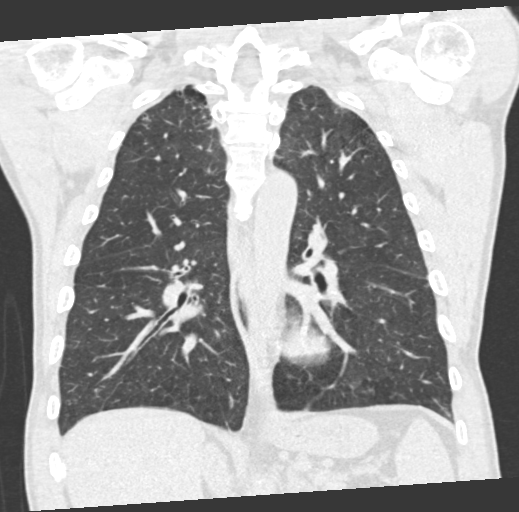

[15 of 36 positions shown; findings below may reference images not displayed]

FINDINGS: Cardiovascular: Heart is normal in size.  No pericardial effusion.

No evidence of thoracic aortic aneurysm. Atherosclerotic
calcifications of the aortic arch.

Three vessel coronary atherosclerosis.

Left subclavian ICD.

Mediastinum/Nodes: Mediastinal lymphadenopathy, including 11 mm
short axis low right paratracheal node (series 2/image 26) and a 17
mm short axis subcarinal node (series 2/image 32), progressive.

Suspected right perihilar lymphadenopathy, although poorly evaluated
on unenhanced CT.

Visualized thyroid is unremarkable.

Lungs/Pleura: Mild biapical pleural-parenchymal scarring

Mild centrilobular and paraseptal emphysematous changes in the
bilateral upper lobes.

Mild peribronchovascular nodularity in the bilateral upper lobes,
nonspecific but favoring mild infection. Additional scattered
subpleural nodularity in the lungs bilaterally.

2.3 x 2.1 cm spiculated nodule in the medial right lower lobe,
previously 1.9 x 1.8 cm, with associated pleural retraction along
the posteromedial right hemithorax (series 3/image 37). Trace right
pleural effusion, new.

Mild patchy opacity in the anterior right lower lobe (series 3/image
51), progressive, nonspecific.

No pneumothorax.

Upper Abdomen: Visualized upper abdomen is notable for mild
perisplenic calcifications and mild vascular calcifications.

Musculoskeletal: Degenerative changes of the visualized
thoracolumbar spine.
IMPRESSION: 2.3 cm spiculated nodule in the medial right lower lobe,
corresponding to the patient's suspected primary bronchogenic
neoplasm, increased.

Associated mediastinal and right perihilar lymphadenopathy,
progressive, suspicious for nodal metastases. Associated involvement
of the posteromedial pleura along the right lower lobe. Trace right
pleural effusion, new.

Mild patchy opacity in the anterior right lower lobe, nonspecific.
Additional mild peribronchovascular nodularity predominantly in the
bilateral upper lobes, favoring mild infection.

Aortic Atherosclerosis (D2ZX9-ML5.5) and Emphysema (D2ZX9-C2L.7).

## 2021-11-07 ENCOUNTER — Ambulatory Visit: Payer: Medicare Other | Admitting: Podiatry

## 2022-01-01 IMAGING — CR DG CHEST 2V
1 series · 2 of 2 positions shown · non-contrast
Comparison: August 27, 2020

CLINICAL DATA: Shortness of breath and cough.

EXAM:
CHEST - 2 VIEW

[Series 1: dg chest 2 view · 0.14mm/px · 2 of 2 slices shown]
[im 1/2]
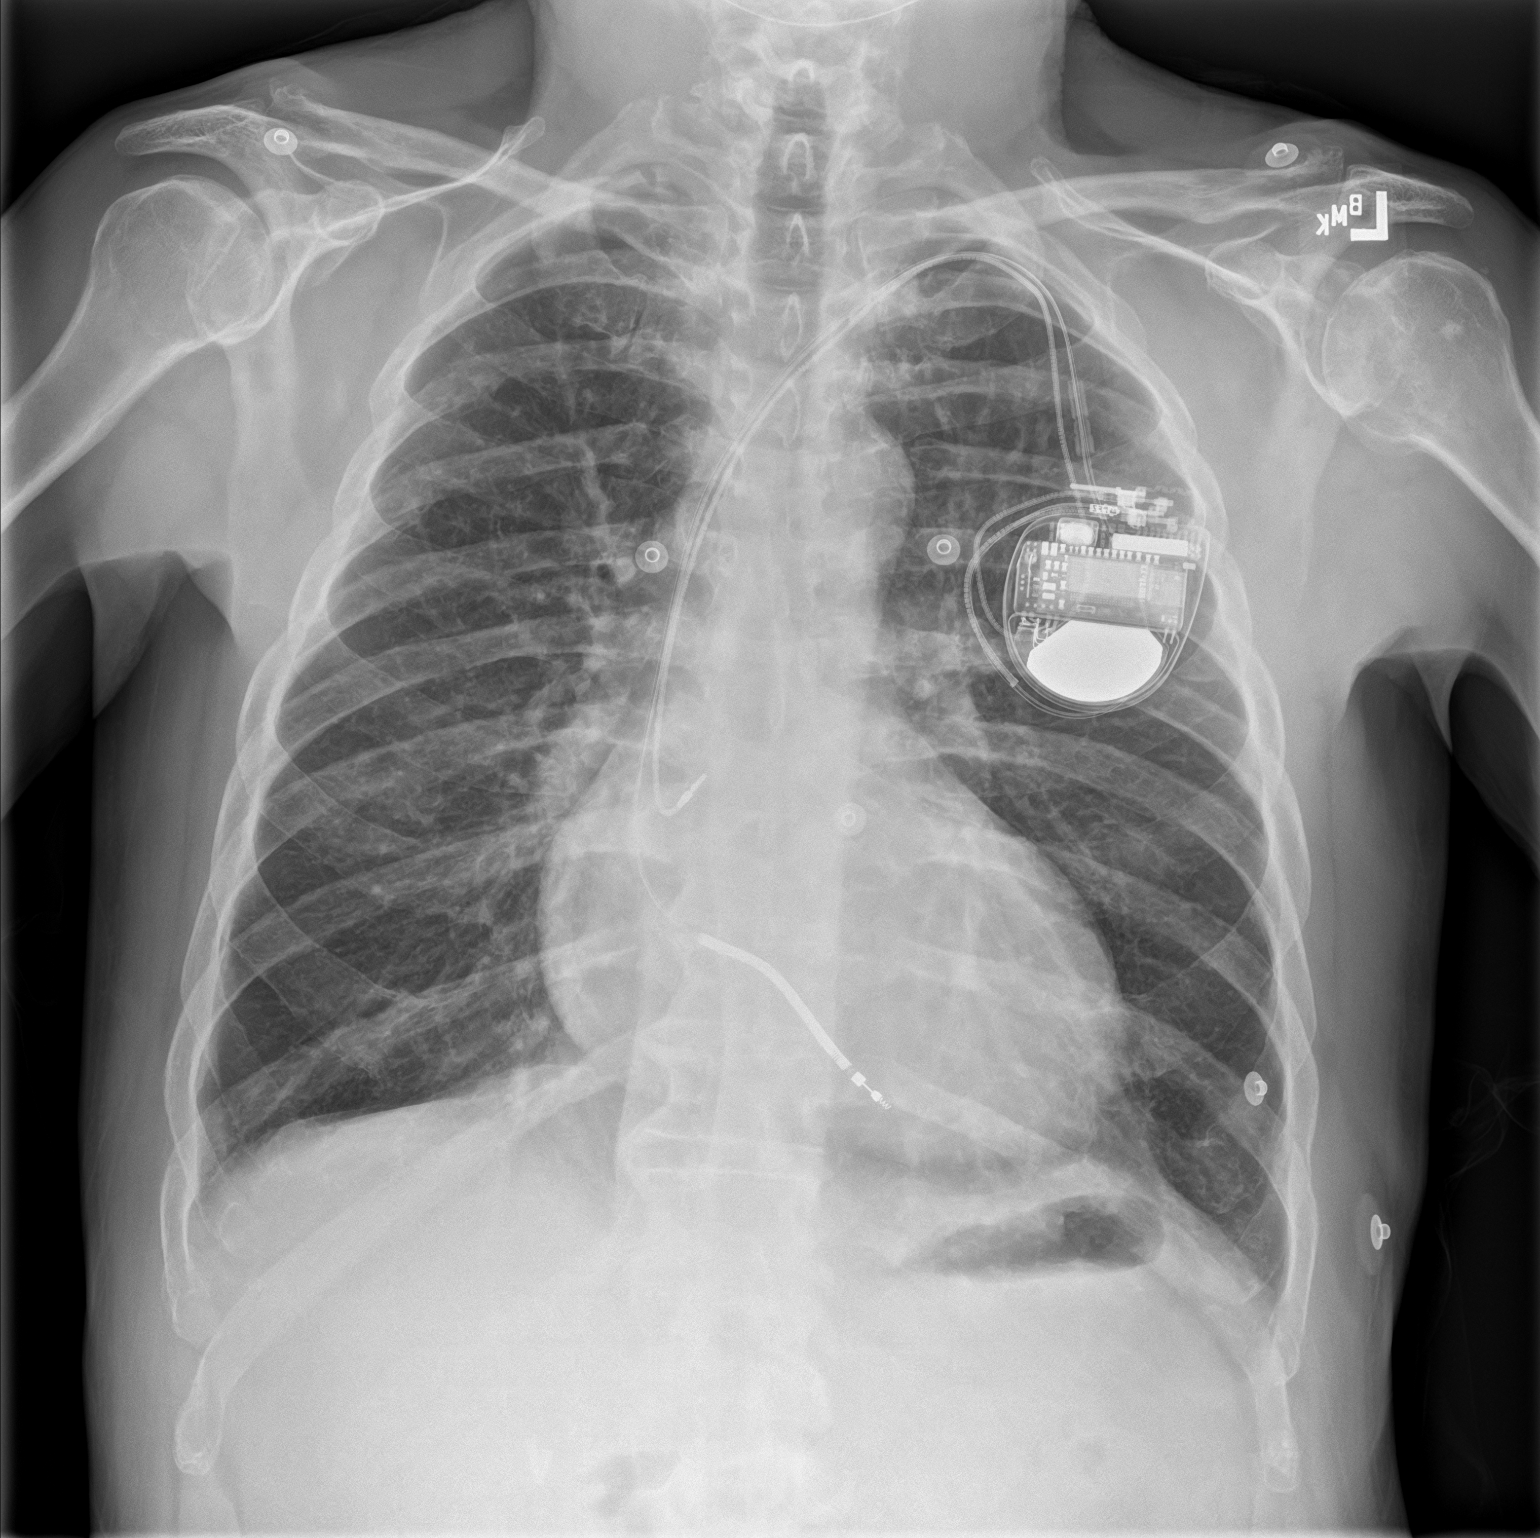
[im 2/2]
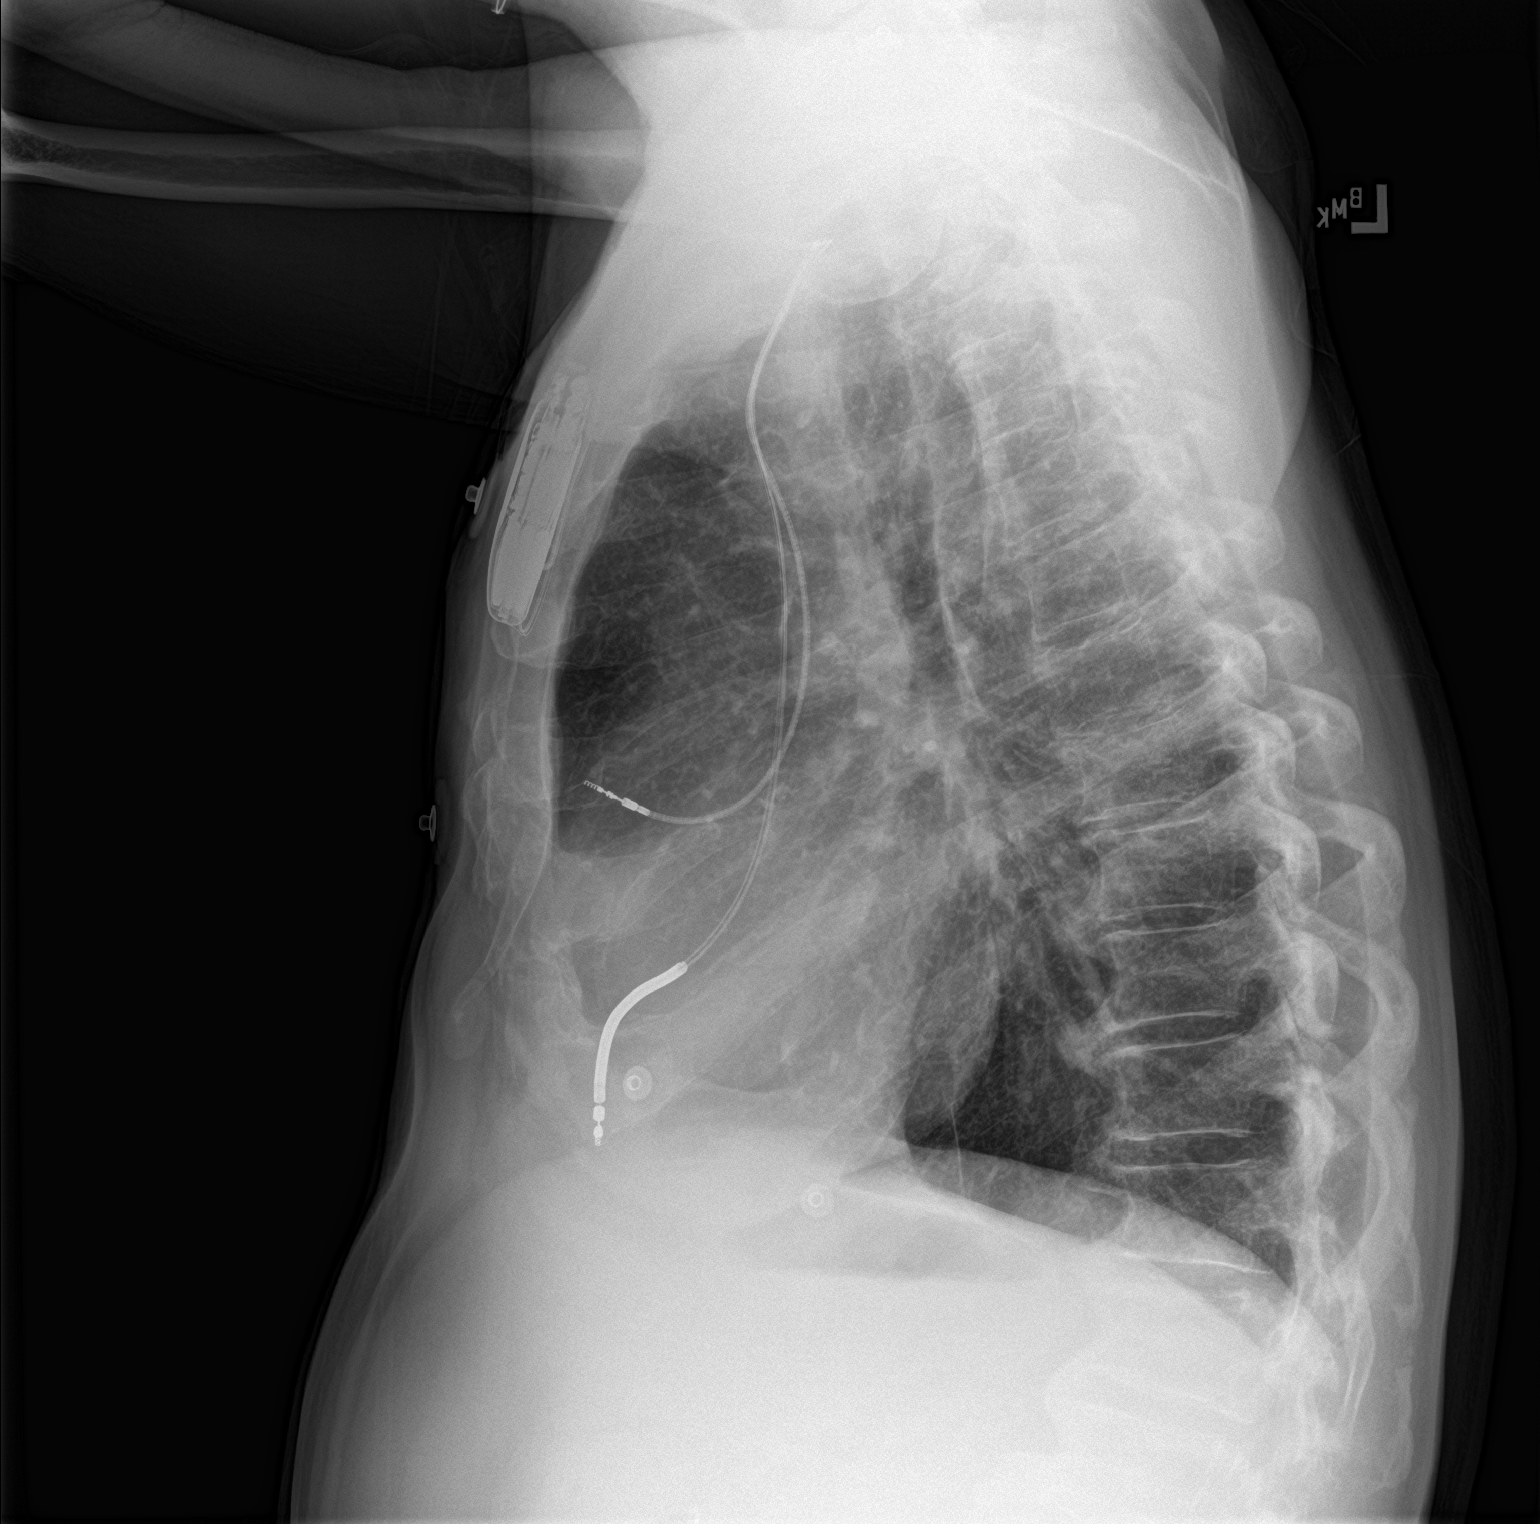

[2 of 2 positions shown; findings below may reference images not displayed]

FINDINGS: The lungs are hyperinflated. A dual lead AICD is noted. Chronic
appearing increased lung markings are seen without evidence of acute
infiltrate, pleural effusion or pneumothorax. The heart size and
mediastinal contours are within normal limits. Chronic fourth and
fifth right rib fractures are seen. Degenerative changes are seen
throughout the thoracic spine.
IMPRESSION: No active cardiopulmonary disease.

## 2022-03-10 ENCOUNTER — Encounter (INDEPENDENT_AMBULATORY_CARE_PROVIDER_SITE_OTHER): Payer: Self-pay

## 2022-03-13 ENCOUNTER — Ambulatory Visit: Payer: Medicare Other | Admitting: Podiatry

## 2022-04-11 DEATH — deceased
